# Patient Record
Sex: Male | Born: 1940 | Race: Asian | Hispanic: No | Smoking: Never smoker
Health system: Southern US, Community
[De-identification: ages and names within clinical notes are randomized; demographics above are authoritative.]

## PROBLEM LIST (undated history)

## (undated) DIAGNOSIS — H9190 Unspecified hearing loss, unspecified ear: Secondary | ICD-10-CM

## (undated) DIAGNOSIS — I10 Essential (primary) hypertension: Secondary | ICD-10-CM

## (undated) HISTORY — PX: EYE SURGERY: SHX253

---

## 1999-11-27 ENCOUNTER — Emergency Department (HOSPITAL_COMMUNITY): Admission: EM | Admit: 1999-11-27 | Discharge: 1999-11-27 | Payer: Self-pay | Admitting: Emergency Medicine

## 1999-11-27 ENCOUNTER — Encounter: Payer: Self-pay | Admitting: Emergency Medicine

## 2002-02-22 ENCOUNTER — Inpatient Hospital Stay (HOSPITAL_COMMUNITY): Admission: EM | Admit: 2002-02-22 | Discharge: 2002-02-25 | Payer: Self-pay | Admitting: Emergency Medicine

## 2002-03-14 ENCOUNTER — Encounter: Admission: RE | Admit: 2002-03-14 | Discharge: 2002-03-14 | Payer: Self-pay | Admitting: Internal Medicine

## 2003-01-13 ENCOUNTER — Emergency Department (HOSPITAL_COMMUNITY): Admission: EM | Admit: 2003-01-13 | Discharge: 2003-01-13 | Payer: Self-pay | Admitting: Emergency Medicine

## 2011-08-06 ENCOUNTER — Encounter (HOSPITAL_COMMUNITY): Payer: Self-pay

## 2011-08-06 ENCOUNTER — Emergency Department (HOSPITAL_COMMUNITY): Payer: Medicare Other

## 2011-08-06 ENCOUNTER — Emergency Department (HOSPITAL_COMMUNITY)
Admission: EM | Admit: 2011-08-06 | Discharge: 2011-08-06 | Disposition: A | Discharge status: Home / Self Care | Payer: Medicare Other | Attending: Emergency Medicine | Admitting: Emergency Medicine

## 2011-08-06 DIAGNOSIS — E119 Type 2 diabetes mellitus without complications: Secondary | ICD-10-CM | POA: Insufficient documentation

## 2011-08-06 DIAGNOSIS — N400 Enlarged prostate without lower urinary tract symptoms: Secondary | ICD-10-CM | POA: Insufficient documentation

## 2011-08-06 DIAGNOSIS — R109 Unspecified abdominal pain: Secondary | ICD-10-CM | POA: Insufficient documentation

## 2011-08-06 DIAGNOSIS — C61 Malignant neoplasm of prostate: Secondary | ICD-10-CM | POA: Insufficient documentation

## 2011-08-06 LAB — CBC
HCT: 38.8 % — ABNORMAL LOW (ref 39.0–52.0)
Hemoglobin: 12.4 g/dL — ABNORMAL LOW (ref 13.0–17.0)
MCH: 26.2 pg (ref 26.0–34.0)
MCHC: 32 g/dL (ref 30.0–36.0)
MCV: 81.9 fL (ref 78.0–100.0)
Platelets: 223 10*3/uL (ref 150–400)
RBC: 4.74 MIL/uL (ref 4.22–5.81)
RDW: 12.6 % (ref 11.5–15.5)
WBC: 9.3 10*3/uL (ref 4.0–10.5)

## 2011-08-06 LAB — URINALYSIS, ROUTINE W REFLEX MICROSCOPIC
Bilirubin Urine: NEGATIVE
Glucose, UA: NEGATIVE mg/dL
Ketones, ur: NEGATIVE mg/dL
Leukocytes, UA: NEGATIVE
Nitrite: NEGATIVE
Protein, ur: 100 mg/dL — AB
Specific Gravity, Urine: 1.022 (ref 1.005–1.030)
Urobilinogen, UA: 0.2 mg/dL (ref 0.0–1.0)
pH: 5 (ref 5.0–8.0)

## 2011-08-06 LAB — DIFFERENTIAL
Basophils Absolute: 0 10*3/uL (ref 0.0–0.1)
Basophils Relative: 0 % (ref 0–1)
Eosinophils Absolute: 0.3 10*3/uL (ref 0.0–0.7)
Eosinophils Relative: 3 % (ref 0–5)
Lymphocytes Relative: 23 % (ref 12–46)
Lymphs Abs: 2.1 10*3/uL (ref 0.7–4.0)
Monocytes Absolute: 0.7 10*3/uL (ref 0.1–1.0)
Monocytes Relative: 8 % (ref 3–12)
Neutro Abs: 6.2 10*3/uL (ref 1.7–7.7)
Neutrophils Relative %: 67 % (ref 43–77)

## 2011-08-06 LAB — BASIC METABOLIC PANEL
BUN: 43 mg/dL — ABNORMAL HIGH (ref 6–23)
CO2: 26 mEq/L (ref 19–32)
Calcium: 9.1 mg/dL (ref 8.4–10.5)
Chloride: 100 mEq/L (ref 96–112)
Creatinine, Ser: 2.62 mg/dL — ABNORMAL HIGH (ref 0.50–1.35)
GFR calc Af Amer: 29 mL/min — ABNORMAL LOW (ref >60)
GFR calc non Af Amer: 24 mL/min — ABNORMAL LOW (ref >60)
Glucose, Bld: 126 mg/dL — ABNORMAL HIGH (ref 70–99)
Potassium: 3.7 mEq/L (ref 3.5–5.1)
Sodium: 136 mEq/L (ref 135–145)

## 2011-08-06 LAB — URINE MICROSCOPIC-ADD ON

## 2013-10-24 ENCOUNTER — Other Ambulatory Visit: Payer: Self-pay | Admitting: Internal Medicine

## 2013-10-24 ENCOUNTER — Ambulatory Visit
Admission: RE | Admit: 2013-10-24 | Discharge: 2013-10-24 | Disposition: A | Discharge status: Home / Self Care | Payer: Medicare Other | Source: Ambulatory Visit | Attending: Internal Medicine | Admitting: Internal Medicine

## 2013-10-24 DIAGNOSIS — M25562 Pain in left knee: Secondary | ICD-10-CM

## 2015-11-05 ENCOUNTER — Other Ambulatory Visit: Payer: Self-pay | Admitting: Internal Medicine

## 2015-11-05 DIAGNOSIS — N259 Disorder resulting from impaired renal tubular function, unspecified: Secondary | ICD-10-CM

## 2015-11-06 ENCOUNTER — Other Ambulatory Visit: Payer: Self-pay | Admitting: Internal Medicine

## 2015-11-06 DIAGNOSIS — N289 Disorder of kidney and ureter, unspecified: Secondary | ICD-10-CM

## 2015-11-06 DIAGNOSIS — R978 Other abnormal tumor markers: Secondary | ICD-10-CM

## 2015-11-06 DIAGNOSIS — Z131 Encounter for screening for diabetes mellitus: Secondary | ICD-10-CM

## 2015-11-08 ENCOUNTER — Ambulatory Visit
Admission: RE | Admit: 2015-11-08 | Discharge: 2015-11-08 | Disposition: A | Discharge status: Home / Self Care | Payer: Medicare Other | Source: Ambulatory Visit | Attending: Internal Medicine | Admitting: Internal Medicine

## 2015-11-08 DIAGNOSIS — N289 Disorder of kidney and ureter, unspecified: Secondary | ICD-10-CM

## 2015-11-08 DIAGNOSIS — R978 Other abnormal tumor markers: Secondary | ICD-10-CM

## 2015-11-09 ENCOUNTER — Other Ambulatory Visit: Payer: Medicare Other

## 2017-05-29 ENCOUNTER — Other Ambulatory Visit: Payer: Self-pay | Admitting: Ophthalmology

## 2017-05-29 DIAGNOSIS — H34212 Partial retinal artery occlusion, left eye: Secondary | ICD-10-CM

## 2017-06-02 ENCOUNTER — Ambulatory Visit
Admission: RE | Admit: 2017-06-02 | Discharge: 2017-06-02 | Disposition: A | Discharge status: Home / Self Care | Payer: Medicare Other | Source: Ambulatory Visit | Attending: Ophthalmology | Admitting: Ophthalmology

## 2017-06-02 DIAGNOSIS — H34212 Partial retinal artery occlusion, left eye: Secondary | ICD-10-CM

## 2018-08-16 ENCOUNTER — Other Ambulatory Visit: Payer: Self-pay | Admitting: Nephrology

## 2018-08-16 DIAGNOSIS — N183 Chronic kidney disease, stage 3 unspecified: Secondary | ICD-10-CM

## 2018-08-16 DIAGNOSIS — I129 Hypertensive chronic kidney disease with stage 1 through stage 4 chronic kidney disease, or unspecified chronic kidney disease: Secondary | ICD-10-CM

## 2018-10-15 ENCOUNTER — Ambulatory Visit
Admission: RE | Admit: 2018-10-15 | Discharge: 2018-10-15 | Disposition: A | Discharge status: Home / Self Care | Payer: Medicare Other | Source: Ambulatory Visit | Attending: Nephrology | Admitting: Nephrology

## 2018-10-15 DIAGNOSIS — N183 Chronic kidney disease, stage 3 unspecified: Secondary | ICD-10-CM

## 2018-10-15 DIAGNOSIS — I129 Hypertensive chronic kidney disease with stage 1 through stage 4 chronic kidney disease, or unspecified chronic kidney disease: Secondary | ICD-10-CM

## 2020-02-08 ENCOUNTER — Ambulatory Visit: Payer: Medicare Other | Attending: Internal Medicine

## 2020-02-08 DIAGNOSIS — Z20822 Contact with and (suspected) exposure to covid-19: Secondary | ICD-10-CM

## 2020-02-09 LAB — NOVEL CORONAVIRUS, NAA: SARS-CoV-2, NAA: NOT DETECTED

## 2021-01-28 ENCOUNTER — Emergency Department (HOSPITAL_COMMUNITY): Payer: Medicare Other

## 2021-01-28 ENCOUNTER — Inpatient Hospital Stay (HOSPITAL_COMMUNITY)
Admission: EM | Admit: 2021-01-28 | Discharge: 2021-02-06 | DRG: 270 | Disposition: A | Discharge status: Home Health | Payer: Medicare Other | Attending: Family Medicine | Admitting: Family Medicine

## 2021-01-28 DIAGNOSIS — J9601 Acute respiratory failure with hypoxia: Secondary | ICD-10-CM | POA: Diagnosis present

## 2021-01-28 DIAGNOSIS — I10 Essential (primary) hypertension: Secondary | ICD-10-CM | POA: Diagnosis present

## 2021-01-28 DIAGNOSIS — R911 Solitary pulmonary nodule: Secondary | ICD-10-CM | POA: Diagnosis present

## 2021-01-28 DIAGNOSIS — I255 Ischemic cardiomyopathy: Secondary | ICD-10-CM | POA: Diagnosis present

## 2021-01-28 DIAGNOSIS — I2584 Coronary atherosclerosis due to calcified coronary lesion: Secondary | ICD-10-CM | POA: Diagnosis present

## 2021-01-28 DIAGNOSIS — N184 Chronic kidney disease, stage 4 (severe): Secondary | ICD-10-CM | POA: Diagnosis present

## 2021-01-28 DIAGNOSIS — I214 Non-ST elevation (NSTEMI) myocardial infarction: Principal | ICD-10-CM

## 2021-01-28 DIAGNOSIS — T8383XA Hemorrhage of genitourinary prosthetic devices, implants and grafts, initial encounter: Secondary | ICD-10-CM | POA: Diagnosis not present

## 2021-01-28 DIAGNOSIS — I959 Hypotension, unspecified: Secondary | ICD-10-CM | POA: Diagnosis not present

## 2021-01-28 DIAGNOSIS — R451 Restlessness and agitation: Secondary | ICD-10-CM | POA: Diagnosis not present

## 2021-01-28 DIAGNOSIS — I5041 Acute combined systolic (congestive) and diastolic (congestive) heart failure: Secondary | ICD-10-CM | POA: Diagnosis present

## 2021-01-28 DIAGNOSIS — J811 Chronic pulmonary edema: Secondary | ICD-10-CM

## 2021-01-28 DIAGNOSIS — M79671 Pain in right foot: Secondary | ICD-10-CM

## 2021-01-28 DIAGNOSIS — E785 Hyperlipidemia, unspecified: Secondary | ICD-10-CM | POA: Diagnosis present

## 2021-01-28 DIAGNOSIS — Z7984 Long term (current) use of oral hypoglycemic drugs: Secondary | ICD-10-CM

## 2021-01-28 DIAGNOSIS — Z79899 Other long term (current) drug therapy: Secondary | ICD-10-CM

## 2021-01-28 DIAGNOSIS — I509 Heart failure, unspecified: Secondary | ICD-10-CM

## 2021-01-28 DIAGNOSIS — N179 Acute kidney failure, unspecified: Secondary | ICD-10-CM

## 2021-01-28 DIAGNOSIS — N289 Disorder of kidney and ureter, unspecified: Secondary | ICD-10-CM

## 2021-01-28 DIAGNOSIS — H9190 Unspecified hearing loss, unspecified ear: Secondary | ICD-10-CM | POA: Diagnosis present

## 2021-01-28 DIAGNOSIS — M79604 Pain in right leg: Secondary | ICD-10-CM | POA: Diagnosis not present

## 2021-01-28 DIAGNOSIS — J810 Acute pulmonary edema: Secondary | ICD-10-CM

## 2021-01-28 DIAGNOSIS — D72829 Elevated white blood cell count, unspecified: Secondary | ICD-10-CM | POA: Diagnosis present

## 2021-01-28 DIAGNOSIS — R310 Gross hematuria: Secondary | ICD-10-CM | POA: Diagnosis not present

## 2021-01-28 DIAGNOSIS — Z87891 Personal history of nicotine dependence: Secondary | ICD-10-CM

## 2021-01-28 DIAGNOSIS — I130 Hypertensive heart and chronic kidney disease with heart failure and stage 1 through stage 4 chronic kidney disease, or unspecified chronic kidney disease: Secondary | ICD-10-CM | POA: Diagnosis present

## 2021-01-28 DIAGNOSIS — I700 Atherosclerosis of aorta: Secondary | ICD-10-CM | POA: Diagnosis present

## 2021-01-28 DIAGNOSIS — R338 Other retention of urine: Secondary | ICD-10-CM | POA: Diagnosis present

## 2021-01-28 DIAGNOSIS — R570 Cardiogenic shock: Secondary | ICD-10-CM | POA: Diagnosis not present

## 2021-01-28 DIAGNOSIS — I2510 Atherosclerotic heart disease of native coronary artery without angina pectoris: Secondary | ICD-10-CM | POA: Diagnosis present

## 2021-01-28 DIAGNOSIS — I2729 Other secondary pulmonary hypertension: Secondary | ICD-10-CM | POA: Diagnosis present

## 2021-01-28 DIAGNOSIS — F05 Delirium due to known physiological condition: Secondary | ICD-10-CM | POA: Diagnosis not present

## 2021-01-28 DIAGNOSIS — I169 Hypertensive crisis, unspecified: Secondary | ICD-10-CM

## 2021-01-28 DIAGNOSIS — E1122 Type 2 diabetes mellitus with diabetic chronic kidney disease: Secondary | ICD-10-CM | POA: Diagnosis present

## 2021-01-28 DIAGNOSIS — Z4509 Encounter for adjustment and management of other cardiac device: Secondary | ICD-10-CM

## 2021-01-28 DIAGNOSIS — M109 Gout, unspecified: Secondary | ICD-10-CM | POA: Diagnosis present

## 2021-01-28 DIAGNOSIS — Z20822 Contact with and (suspected) exposure to covid-19: Secondary | ICD-10-CM | POA: Diagnosis present

## 2021-01-28 DIAGNOSIS — R001 Bradycardia, unspecified: Secondary | ICD-10-CM | POA: Diagnosis not present

## 2021-01-28 DIAGNOSIS — I445 Left posterior fascicular block: Secondary | ICD-10-CM | POA: Diagnosis present

## 2021-01-28 DIAGNOSIS — R0603 Acute respiratory distress: Secondary | ICD-10-CM

## 2021-01-28 DIAGNOSIS — R509 Fever, unspecified: Secondary | ICD-10-CM | POA: Diagnosis not present

## 2021-01-28 HISTORY — DX: Essential (primary) hypertension: I10

## 2021-01-28 HISTORY — DX: Unspecified hearing loss, unspecified ear: H91.90

## 2021-01-28 LAB — CBC WITH DIFFERENTIAL/PLATELET
Abs Immature Granulocytes: 0.09 10*3/uL — ABNORMAL HIGH (ref 0.00–0.07)
Basophils Absolute: 0.1 10*3/uL (ref 0.0–0.1)
Basophils Relative: 0 %
Eosinophils Absolute: 0.3 10*3/uL (ref 0.0–0.5)
Eosinophils Relative: 2 %
HCT: 37.9 % — ABNORMAL LOW (ref 39.0–52.0)
Hemoglobin: 12.1 g/dL — ABNORMAL LOW (ref 13.0–17.0)
Immature Granulocytes: 1 %
Lymphocytes Relative: 13 %
Lymphs Abs: 2.1 10*3/uL (ref 0.7–4.0)
MCH: 26.9 pg (ref 26.0–34.0)
MCHC: 31.9 g/dL (ref 30.0–36.0)
MCV: 84.2 fL (ref 80.0–100.0)
Monocytes Absolute: 0.9 10*3/uL (ref 0.1–1.0)
Monocytes Relative: 6 %
Neutro Abs: 12.3 10*3/uL — ABNORMAL HIGH (ref 1.7–7.7)
Neutrophils Relative %: 78 %
Platelets: 501 10*3/uL — ABNORMAL HIGH (ref 150–400)
RBC: 4.5 MIL/uL (ref 4.22–5.81)
RDW: 13.2 % (ref 11.5–15.5)
WBC: 15.7 10*3/uL — ABNORMAL HIGH (ref 4.0–10.5)
nRBC: 0 % (ref 0.0–0.2)

## 2021-01-28 MED ORDER — ASPIRIN 81 MG PO CHEW
324.0000 mg | CHEWABLE_TABLET | Freq: Once | ORAL | Status: AC
  Administered 2021-01-28: 324 mg via ORAL
  Filled 2021-01-28: qty 4

## 2021-01-28 MED ORDER — NITROGLYCERIN 0.4 MG SL SUBL
0.4000 mg | SUBLINGUAL_TABLET | SUBLINGUAL | Status: DC | PRN
  Administered 2021-01-28: 0.4 mg via SUBLINGUAL
  Filled 2021-01-28: qty 1

## 2021-01-28 NOTE — ED Provider Notes (Signed)
[facility] EMERGENCY DEPARTMENT Provider Note   CSN: [id number] Arrival date & time: 01/28/21  2221     History No chief complaint on file.   Randy [name] is a 80 y.o. male.  Patient with no reported PMH presents to the ED with a chief complaint of sudden onset SOB and CP that started tonight after eating noodles.  He denies having ever had symptoms like this before.  He denies any fevers, chills, or cough.  Denies nausea or vomiting.  He has received 2 nebs PTA along with a Tylenol and 125mg  of solumedrol.  He reports some improvement with these medications.  He denies known covid exposures.  He has been vaccinated.  The history is provided by the patient. The history is limited by a language barrier. A language interpreter was used ([place] interpreter used).       No past medical history on file.  There are no problems to display for this patient.   Past Surgical History:  Procedure Laterality Date  . EYE SURGERY         No family history on file.     Home Medications Prior to Admission medications   Not on File    Allergies    Patient has no allergy information on record.  Review of Systems   Review of Systems  All other systems reviewed and are negative.   Physical Exam Updated Vital Signs BP (!) 176/85   Pulse 88   Temp 98.1 F (36.7 C) (Oral)   Resp (!) 28   SpO2 100%   Physical Exam Vitals and nursing note reviewed.  Constitutional:      Appearance: He is well-developed and well-nourished.  HENT:     Head: Normocephalic and atraumatic.  Eyes:     Conjunctiva/sclera: Conjunctivae normal.  Cardiovascular:     Rate and Rhythm: Normal rate and regular rhythm.     Heart sounds: No murmur heard.   Pulmonary:     Comments: Mild lower lobe crackles Increased work of breathing Abdominal:     Palpations: Abdomen is soft.     Tenderness: There is no abdominal tenderness.  Musculoskeletal:        General: No edema.      Cervical back: Neck supple.  Skin:    General: Skin is warm and dry.  Neurological:     Mental Status: He is alert and oriented to person, place, and time.  Psychiatric:        Mood and Affect: Mood and affect and mood normal.        Behavior: Behavior normal.     ED Results / Procedures / Treatments   Labs (all labs ordered are listed, but only abnormal results are displayed) Labs Reviewed  CBC WITH DIFFERENTIAL/PLATELET  BASIC METABOLIC PANEL  BRAIN NATRIURETIC PEPTIDE  POC SARS CORONAVIRUS 2 AG -  ED  TROPONIN I (HIGH SENSITIVITY)    EKG None  Radiology DG Chest Port 1 View  Result Date: 01/28/2021 CLINICAL DATA:  Shortness of breath EXAM: PORTABLE CHEST 1 VIEW COMPARISON:  None. FINDINGS: Heart is borderline in size. Vascular congestion. Mild interstitial prominence could reflect interstitial edema. Right basilar opacity, likely atelectasis with small right effusion. IMPRESSION: Borderline heart size with vascular congestion and possible early interstitial edema. Small right effusion with right base atelectasis. Electronically Signed   By: Rolm Baptise M.D.   On: 01/28/2021 22:55    Procedures .Critical Care Performed by: Montine Circle, PA-C Authorized by: Marlon Pel,  Herbie Baltimore, PA-C   Critical care provider statement:    Critical care time (minutes):  72   Critical care was necessary to treat or prevent imminent or life-threatening deterioration of the following conditions:  Respiratory failure   Critical care was time spent personally by me on the following activities:  Discussions with consultants, evaluation of patient's response to treatment, examination of patient, ordering and performing treatments and interventions, ordering and review of laboratory studies, ordering and review of radiographic studies, pulse oximetry, re-evaluation of patient's condition, obtaining history from patient or surrogate and review of old charts     Medications Ordered in  ED Medications  aspirin chewable tablet 324 mg (has no administration in time range)  nitroGLYCERIN (NITROSTAT) SL tablet 0.4 mg (has no administration in time range)    ED Course  I have reviewed the triage vital signs and the nursing notes.  Pertinent labs & imaging results that were available during my care of the patient were reviewed by me and considered in my medical decision making (see chart for details).    MDM Rules/Calculators/A&P                          This patient complains of SOB, CP, and wheezing, this involves an extensive number of treatment options, and is a complaint that carries with it a high risk of complications and morbidity.    Randy [name] was evaluated in Emergency Department on 01/28/2021 for the symptoms described in the history of present illness. He was evaluated in the context of the global COVID-19 pandemic, which necessitated consideration that the patient might be at risk for infection with the SARS-CoV-2 virus that causes COVID-19. Institutional protocols and algorithms that pertain to the evaluation of patients at risk for COVID-19 are in a state of rapid change based on information released by regulatory bodies including the CDC and federal and state organizations. These policies and algorithms were followed during the patient's care in the ED.  10:58 PM I attempted to contact the patient's son with the number he provided, but was unsuccessful.  Differential Dx ACS, PE, COVID, pulmonary edema, allergic reaction  Pertinent Labs I ordered, reviewed, and interpreted labs, which included trop 238, Cr 1.75 better than 10 years ago, BNP 634, moderate leukocytosis to 15.7.  Imaging Interpretation I ordered imaging studies which included CXR.  I independently visualized and interpreted the CXR, which showed pulmonary edema.   Medications I ordered medication SL nitro, nitro infusion, lasix for pulmonary edema.  Sources Previous records obtained  and reviewed no recent visits in years, care everywhere shows an eye surgery at [facility] a couple years ago.   Critical Interventions  BiPAP Nitro infusion Continuous monitoring  Reassessments After the interventions stated above, I reevaluated the patient and found with worsening respiratory distress.  Placed patient on BiPAP and started nitro infusion.  2:09 AM Breathing significantly improved with BiPAP.  Consultants Dr. Paticia Stack - Cardiology, who is appreciated for consulting. Dr. Alcario Drought - TRH, who is appreciated for admitting.  Plan Admit     Final Clinical Impression(s) / ED Diagnoses Final diagnoses:  Acute pulmonary edema ([place])  Hypertensive crisis  Respiratory distress    Rx / DC Orders ED Discharge Orders    None       Montine Circle, PA-C 01/29/21 0210    Quintella Reichert, MD 01/31/21 [phone]

## 2021-01-28 NOTE — ED Triage Notes (Signed)
Pt bib EMS from [facility] for sudden onset SOB. Pt speaks laotian. [facility].   Pt had sudden onset of SOB without any recent fever or cough. No significant medical history noted.  Per EMS pts lungs sounds are very diminished with expiratory wheezing.  Pt has received 2 nebulizer treatments. 5 of albuterol x1 and 5 of albuterol with .5 of atrovant added x1.    20G on left hand, 125 of solumedrol given  Vitals: BP: 170/96 HR: 80 O2: 86% on RA to 100% on 8L Nebulizer

## 2021-01-29 ENCOUNTER — Other Ambulatory Visit (HOSPITAL_COMMUNITY): Payer: Self-pay

## 2021-01-29 ENCOUNTER — Encounter (HOSPITAL_COMMUNITY): Payer: Self-pay | Admitting: Internal Medicine

## 2021-01-29 ENCOUNTER — Inpatient Hospital Stay (HOSPITAL_COMMUNITY): Payer: Medicare Other

## 2021-01-29 DIAGNOSIS — I2729 Other secondary pulmonary hypertension: Secondary | ICD-10-CM | POA: Diagnosis present

## 2021-01-29 DIAGNOSIS — I130 Hypertensive heart and chronic kidney disease with heart failure and stage 1 through stage 4 chronic kidney disease, or unspecified chronic kidney disease: Secondary | ICD-10-CM | POA: Diagnosis present

## 2021-01-29 DIAGNOSIS — F05 Delirium due to known physiological condition: Secondary | ICD-10-CM | POA: Diagnosis not present

## 2021-01-29 DIAGNOSIS — I10 Essential (primary) hypertension: Secondary | ICD-10-CM | POA: Diagnosis not present

## 2021-01-29 DIAGNOSIS — I5041 Acute combined systolic (congestive) and diastolic (congestive) heart failure: Secondary | ICD-10-CM | POA: Diagnosis present

## 2021-01-29 DIAGNOSIS — J9601 Acute respiratory failure with hypoxia: Secondary | ICD-10-CM | POA: Diagnosis present

## 2021-01-29 DIAGNOSIS — I25110 Atherosclerotic heart disease of native coronary artery with unstable angina pectoris: Secondary | ICD-10-CM | POA: Diagnosis not present

## 2021-01-29 DIAGNOSIS — Z20822 Contact with and (suspected) exposure to covid-19: Secondary | ICD-10-CM | POA: Diagnosis present

## 2021-01-29 DIAGNOSIS — T8383XA Hemorrhage of genitourinary prosthetic devices, implants and grafts, initial encounter: Secondary | ICD-10-CM | POA: Diagnosis not present

## 2021-01-29 DIAGNOSIS — R310 Gross hematuria: Secondary | ICD-10-CM | POA: Diagnosis not present

## 2021-01-29 DIAGNOSIS — R001 Bradycardia, unspecified: Secondary | ICD-10-CM | POA: Diagnosis not present

## 2021-01-29 DIAGNOSIS — R7989 Other specified abnormal findings of blood chemistry: Secondary | ICD-10-CM | POA: Diagnosis not present

## 2021-01-29 DIAGNOSIS — R911 Solitary pulmonary nodule: Secondary | ICD-10-CM | POA: Diagnosis present

## 2021-01-29 DIAGNOSIS — R0602 Shortness of breath: Secondary | ICD-10-CM | POA: Diagnosis not present

## 2021-01-29 DIAGNOSIS — N189 Chronic kidney disease, unspecified: Secondary | ICD-10-CM | POA: Diagnosis not present

## 2021-01-29 DIAGNOSIS — E785 Hyperlipidemia, unspecified: Secondary | ICD-10-CM | POA: Diagnosis present

## 2021-01-29 DIAGNOSIS — M79604 Pain in right leg: Secondary | ICD-10-CM | POA: Diagnosis not present

## 2021-01-29 DIAGNOSIS — R0603 Acute respiratory distress: Secondary | ICD-10-CM

## 2021-01-29 DIAGNOSIS — I255 Ischemic cardiomyopathy: Secondary | ICD-10-CM | POA: Diagnosis not present

## 2021-01-29 DIAGNOSIS — M109 Gout, unspecified: Secondary | ICD-10-CM | POA: Diagnosis present

## 2021-01-29 DIAGNOSIS — I169 Hypertensive crisis, unspecified: Secondary | ICD-10-CM | POA: Diagnosis present

## 2021-01-29 DIAGNOSIS — R570 Cardiogenic shock: Secondary | ICD-10-CM | POA: Diagnosis not present

## 2021-01-29 DIAGNOSIS — N179 Acute kidney failure, unspecified: Secondary | ICD-10-CM | POA: Diagnosis present

## 2021-01-29 DIAGNOSIS — R338 Other retention of urine: Secondary | ICD-10-CM | POA: Diagnosis present

## 2021-01-29 DIAGNOSIS — I2510 Atherosclerotic heart disease of native coronary artery without angina pectoris: Secondary | ICD-10-CM | POA: Diagnosis present

## 2021-01-29 DIAGNOSIS — D72829 Elevated white blood cell count, unspecified: Secondary | ICD-10-CM | POA: Diagnosis present

## 2021-01-29 DIAGNOSIS — E1122 Type 2 diabetes mellitus with diabetic chronic kidney disease: Secondary | ICD-10-CM | POA: Diagnosis present

## 2021-01-29 DIAGNOSIS — N184 Chronic kidney disease, stage 4 (severe): Secondary | ICD-10-CM | POA: Diagnosis present

## 2021-01-29 DIAGNOSIS — J810 Acute pulmonary edema: Secondary | ICD-10-CM | POA: Diagnosis not present

## 2021-01-29 DIAGNOSIS — I214 Non-ST elevation (NSTEMI) myocardial infarction: Secondary | ICD-10-CM | POA: Diagnosis present

## 2021-01-29 DIAGNOSIS — I2584 Coronary atherosclerosis due to calcified coronary lesion: Secondary | ICD-10-CM | POA: Diagnosis present

## 2021-01-29 DIAGNOSIS — I509 Heart failure, unspecified: Secondary | ICD-10-CM

## 2021-01-29 DIAGNOSIS — H9190 Unspecified hearing loss, unspecified ear: Secondary | ICD-10-CM | POA: Diagnosis present

## 2021-01-29 DIAGNOSIS — I5021 Acute systolic (congestive) heart failure: Secondary | ICD-10-CM | POA: Diagnosis not present

## 2021-01-29 DIAGNOSIS — I501 Left ventricular failure: Secondary | ICD-10-CM | POA: Diagnosis not present

## 2021-01-29 DIAGNOSIS — R509 Fever, unspecified: Secondary | ICD-10-CM | POA: Diagnosis not present

## 2021-01-29 DIAGNOSIS — N289 Disorder of kidney and ureter, unspecified: Secondary | ICD-10-CM

## 2021-01-29 LAB — BASIC METABOLIC PANEL
Anion gap: 14 (ref 5–15)
Anion gap: 21 — ABNORMAL HIGH (ref 5–15)
BUN: 30 mg/dL — ABNORMAL HIGH (ref 8–23)
BUN: 33 mg/dL — ABNORMAL HIGH (ref 8–23)
CO2: 20 mmol/L — ABNORMAL LOW (ref 22–32)
CO2: 14 mmol/L — ABNORMAL LOW (ref 22–32)
Calcium: 8.3 mg/dL — ABNORMAL LOW (ref 8.9–10.3)
Calcium: 8.6 mg/dL — ABNORMAL LOW (ref 8.9–10.3)
Chloride: 103 mmol/L (ref 98–111)
Chloride: 100 mmol/L (ref 98–111)
Creatinine, Ser: 1.75 mg/dL — ABNORMAL HIGH (ref 0.61–1.24)
Creatinine, Ser: 2.27 mg/dL — ABNORMAL HIGH (ref 0.61–1.24)
GFR, Estimated: 39 mL/min — ABNORMAL LOW (ref >60)
GFR, Estimated: 28 mL/min — ABNORMAL LOW (ref >60)
Glucose, Bld: 204 mg/dL — ABNORMAL HIGH (ref 70–99)
Glucose, Bld: 314 mg/dL — ABNORMAL HIGH (ref 70–99)
Potassium: 3.7 mmol/L (ref 3.5–5.1)
Potassium: 4.9 mmol/L (ref 3.5–5.1)
Sodium: 137 mmol/L (ref 135–145)
Sodium: 135 mmol/L (ref 135–145)

## 2021-01-29 LAB — CBG MONITORING, ED
Glucose-Capillary: 227 mg/dL — ABNORMAL HIGH (ref 70–99)
Glucose-Capillary: 192 mg/dL — ABNORMAL HIGH (ref 70–99)
Glucose-Capillary: 137 mg/dL — ABNORMAL HIGH (ref 70–99)

## 2021-01-29 LAB — URINALYSIS, ROUTINE W REFLEX MICROSCOPIC
Bilirubin Urine: NEGATIVE
Glucose, UA: 150 mg/dL — AB
Ketones, ur: NEGATIVE mg/dL
Leukocytes,Ua: NEGATIVE
Nitrite: NEGATIVE
Protein, ur: NEGATIVE mg/dL
Specific Gravity, Urine: 1.008 (ref 1.005–1.030)
pH: 5 (ref 5.0–8.0)

## 2021-01-29 LAB — HEPARIN LEVEL (UNFRACTIONATED): Heparin Unfractionated: 0.43 IU/mL (ref 0.30–0.70)

## 2021-01-29 LAB — ECHOCARDIOGRAM COMPLETE
Calc EF: 44.9 %
P 1/2 time: 419 msec
S' Lateral: 3.7 cm
Single Plane A2C EF: 42.8 %
Single Plane A4C EF: 43.8 %

## 2021-01-29 LAB — TROPONIN I (HIGH SENSITIVITY)
Troponin I (High Sensitivity): 14097 ng/L (ref <18)
Troponin I (High Sensitivity): 14215 ng/L (ref <18)
Troponin I (High Sensitivity): 15900 ng/L (ref <18)
Troponin I (High Sensitivity): 16150 ng/L (ref <18)
Troponin I (High Sensitivity): 16532 ng/L (ref <18)
Troponin I (High Sensitivity): 238 ng/L (ref <18)
Troponin I (High Sensitivity): 266 ng/L (ref <18)

## 2021-01-29 LAB — LIPID PANEL
Cholesterol: 211 mg/dL — ABNORMAL HIGH (ref 0–200)
HDL: 68 mg/dL (ref >40)
LDL Cholesterol: 129 mg/dL — ABNORMAL HIGH (ref 0–99)
Total CHOL/HDL Ratio: 3.1 RATIO
Triglycerides: 71 mg/dL (ref <150)
VLDL: 14 mg/dL (ref 0–40)

## 2021-01-29 LAB — BRAIN NATRIURETIC PEPTIDE: B Natriuretic Peptide: 643.2 pg/mL — ABNORMAL HIGH (ref 0.0–100.0)

## 2021-01-29 LAB — TSH: TSH: 1.851 u[IU]/mL (ref 0.350–4.500)

## 2021-01-29 LAB — HEMOGLOBIN A1C
Hgb A1c MFr Bld: 8.1 % — ABNORMAL HIGH (ref 4.8–5.6)
Mean Plasma Glucose: 185.77 mg/dL

## 2021-01-29 LAB — SODIUM, URINE, RANDOM: Sodium, Ur: 115 mmol/L

## 2021-01-29 LAB — POC SARS CORONAVIRUS 2 AG -  ED: SARS Coronavirus 2 Ag: NEGATIVE

## 2021-01-29 LAB — CREATININE, URINE, RANDOM: Creatinine, Urine: 35.54 mg/dL

## 2021-01-29 MED ORDER — HEPARIN (PORCINE) 25000 UT/250ML-% IV SOLN
800.0000 [IU]/h | INTRAVENOUS | Status: DC
  Administered 2021-01-29 – 2021-01-30 (×2): 800 [IU]/h via INTRAVENOUS
  Filled 2021-01-29 (×2): qty 250

## 2021-01-29 MED ORDER — SODIUM CHLORIDE 0.9% FLUSH
3.0000 mL | INTRAVENOUS | Status: DC | PRN
Start: 2021-01-29 — End: 2021-02-02

## 2021-01-29 MED ORDER — FUROSEMIDE 10 MG/ML IJ SOLN
40.0000 mg | INTRAMUSCULAR | Status: AC
  Administered 2021-01-29: 40 mg via INTRAVENOUS
  Filled 2021-01-29: qty 4

## 2021-01-29 MED ORDER — SODIUM CHLORIDE 0.9 % IV SOLN
250.0000 mL | INTRAVENOUS | Status: DC | PRN
Start: 2021-01-29 — End: 2021-01-30

## 2021-01-29 MED ORDER — SODIUM CHLORIDE 0.9 % IR SOLN
3000.0000 mL | Status: DC
  Administered 2021-01-29 – 2021-01-30 (×3): 3000 mL

## 2021-01-29 MED ORDER — SODIUM CHLORIDE 0.9% FLUSH
3.0000 mL | Freq: Two times a day (BID) | INTRAVENOUS | Status: DC
  Administered 2021-01-30 – 2021-01-31 (×3): 3 mL via INTRAVENOUS

## 2021-01-29 MED ORDER — FUROSEMIDE 10 MG/ML IJ SOLN
40.0000 mg | Freq: Every day | INTRAMUSCULAR | Status: DC
  Administered 2021-01-29: 40 mg via INTRAVENOUS
  Filled 2021-01-29: qty 4

## 2021-01-29 MED ORDER — ONDANSETRON HCL 4 MG/2ML IJ SOLN: 4.0000 mg | Freq: Four times a day (QID) | INTRAMUSCULAR | Status: DC | PRN

## 2021-01-29 MED ORDER — ATORVASTATIN CALCIUM 80 MG PO TABS
80.0000 mg | ORAL_TABLET | Freq: Every day | ORAL | Status: DC
  Administered 2021-01-29 – 2021-01-30 (×2): 80 mg via ORAL
  Filled 2021-01-29 (×2): qty 1

## 2021-01-29 MED ORDER — ISOSORB DINITRATE-HYDRALAZINE 20-37.5 MG PO TABS
1.0000 | ORAL_TABLET | Freq: Three times a day (TID) | ORAL | Status: DC
  Administered 2021-01-29 – 2021-01-30 (×5): 1 via ORAL
  Filled 2021-01-29 (×5): qty 1

## 2021-01-29 MED ORDER — ACETAMINOPHEN 325 MG PO TABS
650.0000 mg | ORAL_TABLET | ORAL | Status: DC | PRN
  Administered 2021-01-29: 650 mg via ORAL
  Filled 2021-01-29: qty 2

## 2021-01-29 MED ORDER — SODIUM CHLORIDE 0.9 % IV SOLN
INTRAVENOUS | Status: DC
  Administered 2021-01-30: 20 mL/h via INTRAVENOUS

## 2021-01-29 MED ORDER — ASPIRIN EC 81 MG PO TBEC
81.0000 mg | DELAYED_RELEASE_TABLET | Freq: Every day | ORAL | Status: DC
  Administered 2021-01-29: 81 mg via ORAL
  Filled 2021-01-29: qty 1

## 2021-01-29 MED ORDER — SODIUM CHLORIDE 0.9% FLUSH
3.0000 mL | Freq: Two times a day (BID) | INTRAVENOUS | Status: DC
  Administered 2021-01-29 – 2021-01-31 (×4): 3 mL via INTRAVENOUS

## 2021-01-29 MED ORDER — INSULIN ASPART 100 UNIT/ML ~~LOC~~ SOLN
0.0000 [IU] | SUBCUTANEOUS | Status: DC
  Administered 2021-01-29: 3 [IU] via SUBCUTANEOUS
  Administered 2021-01-29: 2 [IU] via SUBCUTANEOUS
  Administered 2021-01-30 – 2021-02-01 (×5): 1 [IU] via SUBCUTANEOUS
  Administered 2021-02-01 (×2): 2 [IU] via SUBCUTANEOUS
  Administered 2021-02-01 (×2): 1 [IU] via SUBCUTANEOUS
  Administered 2021-02-01: 5 [IU] via SUBCUTANEOUS
  Administered 2021-02-02: 3 [IU] via SUBCUTANEOUS
  Administered 2021-02-02: 2 [IU] via SUBCUTANEOUS

## 2021-01-29 MED ORDER — ENOXAPARIN SODIUM 40 MG/0.4ML ~~LOC~~ SOLN: 40.0000 mg | SUBCUTANEOUS | Status: DC

## 2021-01-29 MED ORDER — HEPARIN BOLUS VIA INFUSION
4000.0000 [IU] | Freq: Once | INTRAVENOUS | Status: AC
  Administered 2021-01-29: 4000 [IU] via INTRAVENOUS
  Filled 2021-01-29: qty 4000

## 2021-01-29 MED ORDER — NITROGLYCERIN IN D5W 200-5 MCG/ML-% IV SOLN
0.0000 ug/min | INTRAVENOUS | Status: DC
  Administered 2021-01-29: 5 ug/min via INTRAVENOUS
  Administered 2021-01-29: 90 ug/min via INTRAVENOUS
  Administered 2021-01-30: 30 ug/min via INTRAVENOUS
  Filled 2021-01-29 (×3): qty 250

## 2021-01-29 NOTE — ED Notes (Signed)
Pt placed on Cardiac pads; zoll.

## 2021-01-29 NOTE — Progress Notes (Signed)
  Echocardiogram 2D Echocardiogram has been performed.  Randy [name] M 01/29/2021, 10:51 AM

## 2021-01-29 NOTE — ED Notes (Signed)
Attempted to place coude catheter x 2 without success. Resistance met and when catheter removed large blood clot noted in catheter.  Patient had blood noted to penis and noted on sheets prior to 1st coude attempt

## 2021-01-29 NOTE — ED Notes (Signed)
Cardiology at bedside.

## 2021-01-29 NOTE — ED Notes (Signed)
Date and time results received: 01/29/21 ** Test: troponin Critical Value: 16,150  Name of Provider Notified: Heath Lark MD

## 2021-01-29 NOTE — Consult Note (Signed)
[facility] Consult Note   Primary Physician:  Jilda Panda, MD Primary Cardiologist: None listed  Reason for Consultation: Shortness of breath  HPI:    Randy [name] is an 80 year old male with a history of hypertension, who has not seen a physician in ages, presents to the hospital with complaints of chest pain and shortness of breath.  The patient had sudden onset of dyspnea in the evening. He also reported some sharp chest pains. EMS documented that the patient had diminished air entry on auscultation and had wheezes. He was treated with Albuterol, Atrovent and Solumedrol.  In the ED the patient's blood pressure was as high as 200/109 mmHg. He was given 40 mg of IV Lasix and started on NTG infusion. The patient had marked improvement on these therapies.  High sensitivity troponins were flat at 238 amd 266. BNP was 643.2. HGBA1c 8.1  The ECG from 01/29/2021 (at 01:09 am) showed sinus rhythm, rate 95 bpm, LVH, ST depressions in inferolateral leads.   Home Medications Prior to Admission medications   Medication Sig Start Date End Date Taking? Authorizing Provider  amLODipine (NORVASC) 5 MG tablet Take by mouth.    [provider]    Past Medical History: Past Medical History:  Diagnosis Date  . Hearing loss   . HTN (hypertension)     Past Surgical History: Past Surgical History:  Procedure Laterality Date  . EYE SURGERY      Family History: History reviewed. No pertinent family history.  Social History: Social History   Socioeconomic History  . Marital status: Married    Spouse name: Not on file  . Number of children: Not on file  . Years of education: Not on file  . Highest education level: Not on file  Occupational History  . Not on file  Tobacco Use  . Smoking status: Not on file  . Smokeless tobacco: Not on file  Substance and Sexual Activity  . Alcohol use: Not on file  . Drug use: Not on file  . Sexual activity: Not on file  Other  Topics Concern  . Not on file  Social History Narrative  . Not on file   Social Determinants of Health   Financial Resource Strain: Not on file  Food Insecurity: Not on file  Transportation Needs: Not on file  Physical Activity: Not on file  Stress: Not on file  Social Connections: Not on file    Allergies:  Not on File   Review of Systems: [y] = yes, [ ]  = no   . General: Weight gain [ ] ; Weight loss [ ] ; Anorexia [ ] ; Fatigue [ ] ; Fever [ ] ; Chills [ ] ; Weakness [ ]   . Cardiac: Chest pain/pressure [Y]; Resting SOB [Y]; Exertional SOB [ ] ; Orthopnea [ ] ; Pedal Edema [ ] ; Palpitations [ ] ; Syncope [ ] ; Presyncope [ ] ; Paroxysmal nocturnal dyspnea[ ]   . Pulmonary: Cough [ ] ; Wheezing[ ] ; Hemoptysis[ ] ; Sputum [ ] ; Snoring [ ]   . GI: Vomiting[ ] ; Dysphagia[ ] ; Melena[ ] ; Hematochezia [ ] ; Heartburn[ ] ; Abdominal pain [ ] ; Constipation [ ] ; Diarrhea [ ] ; BRBPR [ ]   . GU: Hematuria[ ] ; Dysuria [ ] ; Nocturia[ ]   . Vascular: Pain in legs with walking [ ] ; Pain in feet with lying flat [ ] ; Non-healing sores [ ] ; Stroke [ ] ; TIA [ ] ; Slurred speech [ ] ;  . Neuro: Headaches[ ] ; Vertigo[ ] ; Seizures[ ] ; Paresthesias[ ] ;Blurred vision [ ] ; Diplopia [ ] ;  Vision changes [ ]   . Ortho/Skin: Arthritis [ ] ; Joint pain [ ] ; Muscle pain [ ] ; Joint swelling [ ] ; Back Pain [ ] ; Rash [ ]   . Psych: Depression[ ] ; Anxiety[ ]   . Heme: Bleeding problems [ ] ; Clotting disorders [ ] ; Anemia [ ]   . Endocrine: Diabetes [ ] ; Thyroid dysfunction[ ]      Objective:    Vital Signs:   Temp:  [98.1 F (36.7 C)] 98.1 F (36.7 C) (02/07 2226) Pulse Rate:  [82-109] 82 (02/08 0345) Resp:  [19-35] 21 (02/08 0345) BP: (125-200)/(68-127) 125/71 (02/08 0345) SpO2:  [93 %-100 %] 98 % (02/08 0345) FiO2 (%):  [40 %] 40 % (02/08 0236)    Weight change: There were no vitals filed for this visit.  Intake/Output:  No intake or output data in the 24 hours ending 01/29/21 0457    Physical Exam    General:  Well  appearing. No resp difficulty HEENT: normal Neck: supple. JVP . Carotids 2+ bilat; no bruits. No lymphadenopathy or thyromegaly appreciated. Cor: PMI nondisplaced. Regular rate & rhythm. No rubs, gallops or murmurs. Lungs: occasional crackles Abdomen: soft, nontender, nondistended. No hepatosplenomegaly. No bruits or masses. Good bowel sounds. Extremities: no cyanosis, clubbing, rash, edema Neuro: alert & orientedx3, cranial nerves grossly intact. moves all 4 extremities w/o difficulty. Affect pleasant    Labs   Basic Metabolic Panel: Recent Labs  Lab 01/28/21 2322  NA 137  K 3.7  CL 103  CO2 20*  GLUCOSE 204*  BUN 30*  CREATININE 1.75*  CALCIUM 8.3*    Liver Function Tests: No results for input(s): AST, ALT, ALKPHOS, BILITOT, PROT, ALBUMIN in the last 168 hours. No results for input(s): LIPASE, AMYLASE in the last 168 hours. No results for input(s): AMMONIA in the last 168 hours.  CBC: Recent Labs  Lab 01/28/21 2322  WBC 15.7*  NEUTROABS 12.3*  HGB 12.1*  HCT 37.9*  MCV 84.2  PLT 501*    Cardiac Enzymes: No results for input(s): CKTOTAL, CKMB, CKMBINDEX, TROPONINI in the last 168 hours.  BNP: BNP (last 3 results) Recent Labs    01/28/21 2322  BNP 643.2*    ProBNP (last 3 results) No results for input(s): PROBNP in the last 8760 hours.   CBG: No results for input(s): GLUCAP in the last 168 hours.  Coagulation Studies: No results for input(s): LABPROT, INR in the last 72 hours.   Imaging   DG Chest Port 1 View  Result Date: 01/28/2021 CLINICAL DATA:  Shortness of breath EXAM: PORTABLE CHEST 1 VIEW COMPARISON:  None. FINDINGS: Heart is borderline in size. Vascular congestion. Mild interstitial prominence could reflect interstitial edema. Right basilar opacity, likely atelectasis with small right effusion. IMPRESSION: Borderline heart size with vascular congestion and possible early interstitial edema. Small right effusion with right base atelectasis.  Electronically Signed   By: Rolm Baptise M.D.   On: 01/28/2021 22:55      Medications:     Current Medications: . enoxaparin (LOVENOX) injection  40 mg Subcutaneous Q24H  . furosemide  40 mg Intravenous Daily  . sodium chloride flush  3 mL Intravenous Q12H     Infusions: . sodium chloride    . nitroGLYCERIN 55 mcg/min (01/29/21 0424)       Assessment/Plan   1. Shortness of breath Likely to be secondary to elevated blood pressure and subsequent acute pulmonary edema.    - Agree with NTG infusion - Optimize BP control - Diuresis with IV Lasix -  Strict I and Os - Daily weights   2. Elevated troponin The trend initially is flat (238 and 266). Does not seem consistent with an ACS. ECG shows LVH with repol abnormalities  - Continue to trend cardiac enzymes - Serial ECGs - ASA 81 mg daily - IV NTG for chest pain - Consider IV heparin only if rising troponin - Transthoracic echocardiogram     Meade Maw, MD  01/29/2021, 4:57 AM  Cardiology Overnight Team Please contact [facility] for night-coverage after hours (4p -7a ) and weekends on [email or web address]

## 2021-01-29 NOTE — Progress Notes (Addendum)
Inpatient Diabetes Program Recommendations  AACE/ADA: New Consensus Statement on Inpatient Glycemic Control (2015)  Target Ranges:  Prepandial:   less than 140 mg/dL      Peak postprandial:   less than 180 mg/dL (1-2 hours)      Critically ill patients:  140 - 180 mg/dL   Lab Results  Component Value Date   GLUCAP 227 (H) 01/29/2021   HGBA1C 8.1 (H) 01/29/2021    Review of Glycemic Control  Diabetes history: DM 2 Outpatient Diabetes medications: Metformin 500 mg bid  A1c 8.1% on 2/8  Pt sees Dr. Mellody Drown as a PCP and has been on metformin in the past. I spoke with daughter, Joelyn Oms who is on her was from [place], [place]. Daughter said she will call PCP to verify pt is prescribed and taking medications as he can be hard headed.  Spoke with pt and daughter about A1c level. Daughter had questions about when the pt is scheduled for cardiac cath tomorrow.  Need to d/c metformin if pt is on it  At time of d/c. Pt may benefit more from [place] 5 mg Daily which is cleared fecally.  Called pt's PCP office (Dr. Mellody Drown) they said he has not filled his meds since 2020, only one doctor's visit in 2021 and he has been a DM for years and is suppose to be on metformin and a statin. Told their office to fax to ED fax machine at [phone] attention for Dr. Manuella Ghazi.  Thanks,  Tama Headings RN, MSN, BC-ADM Inpatient Diabetes Coordinator Team Pager [phone] (8a-5p)

## 2021-01-29 NOTE — ED Notes (Signed)
Paged Dr. Paticia Stack to [facility] Browing

## 2021-01-29 NOTE — ED Notes (Signed)
ECHO at bedside.

## 2021-01-29 NOTE — ED Notes (Signed)
Secretary to order lunch tray

## 2021-01-29 NOTE — ED Notes (Signed)
US at bedside

## 2021-01-29 NOTE — ED Notes (Signed)
Urology MD at bedside for catheter placement.

## 2021-01-29 NOTE — ED Notes (Signed)
Date and time results received: 01/29/21  Test: Troponin  Critical Value: Gregory  Name of Provider Notified: Heath Lark MD

## 2021-01-29 NOTE — Progress Notes (Addendum)
This is an 80 year old male with history of hypertension who presented to the ED with complaints of chest pain and shortness of breath that suddenly began earlier on the evening of 2/7.  He was admitted with acute pulmonary edema and started on nitroglycerin infusion as well as IV Lasix for diuresis.  He was also noted to have elevated troponin levels with initial flat trend which was not thought to be consistent with ACS per cardiology.  Further troponin levels this morning have demonstrated a level of over 16,000 for which heparin drip has been initiated.  Patient remains on aspirin and statin as well.  He has been seen by cardiology with plans for cardiac catheterization.  Patient seen and evaluated this morning at bedside with no complaints of chest pain.  He did have some mild shortness of breath earlier this morning.  He was admitted after midnight.  Elevated troponin likely NSTEMI/ACS -Per Cardiology with plans for catheterization noted  -2D echocardiogram ordered and pending -Started on heparin drip -Continue nitroglycerin -Aspirin and statin   Hypertension -Currently controlled on nitroglycerin  Renal insufficiency -Question possible CKD IV -Check urine electrolytes -Check renal u/s -Monitor intake and output -Avoid nephrotoxic agents -Urinalysis without proteinuria -Monitor repeat labs in a.m.  Type 2 diabetes -Takes metformin BID which will be held; likely dc on discharge given renal function -Hemoglobin A1c 8.1% -Blood glucose levels over 300 -Plan to start SSI and consult diabetes coordinator  Newly diagnosed dyslipidemia -Total cholesterol 211 with LDL 129 -Started on statin per cardiology -TSH 1.85  Total care time: 45 minutes.

## 2021-01-29 NOTE — Procedures (Signed)
Pre-procedure diagnosis: difficult foley catheterization Post-procedure diagnosis: as above  Procedure performed: placement of complicated foley  Surgeon: Dr. Ardis Hughs  Findings: tight prostatic urethra, gross hematuria, ~700cc of bloody urine quickly returned Specimen: none  Drains:  47F coude tipped 3-way foley  Indications:  Patient unable to void on his own.  Nursing staff have been unsuccessful at placing catheter.  Procedure:  Gentials were prepped and draped in the routine sterile fashion.  10cc of 1% viscous lidocaine jelly was then injected into the patient's urethra.  A 47F coude tipped catheter was then gently passed in to the urethral.  Resistance was met at the prostate, but with gentle pressue the catheter slid through the prostate and into the bladder.  Clear yellow urine was returned.  Patient tolerated the procedure well - no immediate issues.  Disposition: Patient was started on CBI.

## 2021-01-29 NOTE — Progress Notes (Signed)
ANTICOAGULATION CONSULT NOTE - Initial Consult  Pharmacy Consult for heparin Indication: chest pain/ACS  No Known Allergies  Patient Measurements:   Heparin Dosing Weight: 67 kg (based on historical records)   Vital Signs: BP: 121/53 (02/08 2115) Pulse Rate: 65 (02/08 2115)  Labs: Recent Labs    01/28/21 2322 01/29/21 0043 01/29/21 0913 01/29/21 1210 01/29/21 1700 01/29/21 1945 01/29/21 2004  HGB 12.1*  --   --   --   --   --   --   HCT 37.9*  --   --   --   --   --   --   PLT 501*  --   --   --   --   --   --   HEPARINUNFRC  --   --   --   --   --   --  0.43  CREATININE 1.75*  --  2.27*  --   --   --   --   TROPONINIHS 238*   < > 14,097* 16,532* 14,215* 15,900*  --    < > = values in this interval not displayed.    CrCl cannot be calculated (Unknown ideal weight.).   Medical History: Past Medical History:  Diagnosis Date  . Hearing loss   . HTN (hypertension)     Medications:  (Not in a hospital admission)   Assessment: 4 YOM who presents with chest pain and SOB now with rising troponins to start IV heparin. Trops are trending down at this time.    H/H low, Plt high. SCr 1.75.   Goal of Therapy:  Heparin level 0.3-0.7 units/ml Monitor platelets by anticoagulation protocol: Yes   Plan:  - Heparin level therapeutic at 0.43 - Continue Heparin drip @ 800 u/h  - Repeat HL with AM labs  - Monitor for s/s of bleeding and cbc   Duanne Limerick, PharmD., BCPS Clinical Pharmacist Please refer to [facility] for unit-specific pharmacist

## 2021-01-29 NOTE — ED Notes (Signed)
Troponin elevated, critical result of 16,150 ng reported to [facility] , pt currently denies chest pain , EKG obtained. Hospitalist aware.

## 2021-01-29 NOTE — Progress Notes (Signed)
ANTICOAGULATION CONSULT NOTE - Initial Consult  Pharmacy Consult for heparin Indication: chest pain/ACS  Not on File  Patient Measurements:   Heparin Dosing Weight: 67 kg (based on historical records)   Vital Signs: Temp: 98.1 F (36.7 C) (02/07 2226) Temp Source: Oral (02/07 2226) BP: 154/72 (02/08 0845) Pulse Rate: 80 (02/08 0845)  Labs: Recent Labs    01/28/21 2322 01/29/21 0043 01/29/21 0725  HGB 12.1*  --   --   HCT 37.9*  --   --   PLT 501*  --   --   CREATININE 1.75*  --   --   TROPONINIHS 238* 266* 16,150*    CrCl cannot be calculated (Unknown ideal weight.).   Medical History: Past Medical History:  Diagnosis Date  . Hearing loss   . HTN (hypertension)     Medications:  (Not in a hospital admission)   Assessment: 54 YOM who presents with chest pain and SOB now with rising troponins to start IV heparin.    H/H low, Plt high. SCr 1.75.   Goal of Therapy:  Heparin level 0.3-0.7 units/ml Monitor platelets by anticoagulation protocol: Yes   Plan:  -Heparin 4000 units IV bolus followed by heparin infusion at 800 units/hr -F/u 8 hr HL -Monitor daily HL, CBC and s/s of bleeding  Albertina Parr, PharmD., BCPS, BCCCP Clinical Pharmacist Please refer to [facility] for unit-specific pharmacist

## 2021-01-29 NOTE — H&P (Signed)
History and Physical    Randy [name] GNF:[id number] DOB: 28-Feb-1941 DOA: 01/28/2021  PCP: Jilda Panda, MD  Patient coming from: Home  I have personally briefly reviewed patient's old medical records in [place]  Chief Complaint: CP, SOB  HPI: Randy [name] is a 80 y.o. male with medical history significant of HTN.  Pt doesn't routinely see doctors it sounds like.  Pt presents to ED with c/o CP and SOB.  Symptoms onset suddenly earlier this evening.  Never had symptoms like this before.  Symptoms are severe, persistent.  Not relieved by Neb treatments with EMS.  No N/V.  No fevers, chills, cough.  Has had COVID vaccine.   ED Course: BP as high as 200/109, WBC 15k, CXR shows pulm edema.  BNP 643, trops 238 and 266.  Creat 1.75, unk baseline.  Breathing and CP are improved significantly after PT put on BIPAP and NTG gtt is started.  Cards is consulted, didn't sound particularly impressed for NSTEMI however.   Review of Systems: As per HPI, otherwise all review of systems negative.  Past Medical History:  Diagnosis Date  . Hearing loss   . HTN (hypertension)     Past Surgical History:  Procedure Laterality Date  . EYE SURGERY       has no history on file for tobacco use, alcohol use, and drug use.  Not on File  History reviewed. No pertinent family history.   Prior to Admission medications   Medication Sig Start Date End Date Taking? Authorizing Provider  amLODipine (NORVASC) 5 MG tablet Take by mouth.    [provider]    Physical Exam: Vitals:   01/29/21 0045 01/29/21 0115 01/29/21 0145 01/29/21 0200  BP: (!) 156/72 (!) 165/84 (!) 200/109 (!) 156/75  Pulse: 92 92 (!) 106 85  Resp: 20 19 (!) 35 19  Temp:      TempSrc:      SpO2: 96% 99% 97% 99%    Constitutional: NAD, calm, comfortable Eyes: PERRL, lids and conjunctivae normal ENMT: Mucous membranes are moist. Posterior pharynx clear of any exudate or lesions.Normal dentition.   Neck: normal, supple, no masses, no thyromegaly Respiratory: B crackles Cardiovascular: Regular rate and rhythm, no murmurs / rubs / gallops. No extremity edema. 2+ pedal pulses. No carotid bruits.  Abdomen: no tenderness, no masses palpated. No hepatosplenomegaly. Bowel sounds positive.  Musculoskeletal: no clubbing / cyanosis. No joint deformity upper and lower extremities. Good ROM, no contractures. Normal muscle tone.  Skin: no rashes, lesions, ulcers. No induration Neurologic: CN 2-12 grossly intact. Sensation intact, DTR normal. Strength 5/5 in all 4.  Psychiatric: Normal judgment and insight. Alert and oriented x 3. Normal mood.    Labs on Admission: I have personally reviewed following labs and imaging studies  CBC: Recent Labs  Lab 01/28/21 2322  WBC 15.7*  NEUTROABS 12.3*  HGB 12.1*  HCT 37.9*  MCV 84.2  PLT 846*   Basic Metabolic Panel: Recent Labs  Lab 01/28/21 2322  NA 137  K 3.7  CL 103  CO2 20*  GLUCOSE 204*  BUN 30*  CREATININE 1.75*  CALCIUM 8.3*   GFR: CrCl cannot be calculated (Unknown ideal weight.). Liver Function Tests: No results for input(s): AST, ALT, ALKPHOS, BILITOT, PROT, ALBUMIN in the last 168 hours. No results for input(s): LIPASE, AMYLASE in the last 168 hours. No results for input(s): AMMONIA in the last 168 hours. Coagulation Profile: No results for input(s): INR, PROTIME in the last 168 hours.  Cardiac Enzymes: No results for input(s): CKTOTAL, CKMB, CKMBINDEX, TROPONINI in the last 168 hours. BNP (last 3 results) No results for input(s): PROBNP in the last 8760 hours. HbA1C: No results for input(s): HGBA1C in the last 72 hours. CBG: No results for input(s): GLUCAP in the last 168 hours. Lipid Profile: No results for input(s): CHOL, HDL, LDLCALC, TRIG, CHOLHDL, LDLDIRECT in the last 72 hours. Thyroid Function Tests: No results for input(s): TSH, T4TOTAL, FREET4, T3FREE, THYROIDAB in the last 72 hours. Anemia Panel: No  results for input(s): VITAMINB12, FOLATE, FERRITIN, TIBC, IRON, RETICCTPCT in the last 72 hours. Urine analysis:    Component Value Date/Time   COLORURINE YELLOW 08/06/2011 0313   APPEARANCEUR CLOUDY (A) 08/06/2011 0313   LABSPEC 1.022 08/06/2011 0313   PHURINE 5.0 08/06/2011 0313   GLUCOSEU NEGATIVE 08/06/2011 0313   HGBUR SMALL (A) 08/06/2011 0313   BILIRUBINUR NEGATIVE 08/06/2011 0313   KETONESUR NEGATIVE 08/06/2011 0313   PROTEINUR 100 (A) 08/06/2011 0313   UROBILINOGEN 0.2 08/06/2011 0313   NITRITE NEGATIVE 08/06/2011 0313   LEUKOCYTESUR NEGATIVE 08/06/2011 0313    Radiological Exams on Admission: DG Chest Port 1 View  Result Date: 01/28/2021 CLINICAL DATA:  Shortness of breath EXAM: PORTABLE CHEST 1 VIEW COMPARISON:  None. FINDINGS: Heart is borderline in size. Vascular congestion. Mild interstitial prominence could reflect interstitial edema. Right basilar opacity, likely atelectasis with small right effusion. IMPRESSION: Borderline heart size with vascular congestion and possible early interstitial edema. Small right effusion with right base atelectasis. Electronically Signed   By: Rolm Baptise M.D.   On: 01/28/2021 22:55    EKG: Independently reviewed.  Assessment/Plan Principal Problem:   New onset of congestive heart failure (HCC) Active Problems:   Renal insufficiency   HTN (hypertension)   Acute respiratory failure with hypoxia ([place])    1. New onset CHF - CP and flash pulmonary edema 1. DDx of cause: hypertensive emergency (more likely), and ACS (less likely given mostly flat trop). 2. Cards consulted 3. CHF pathway 4. BIPAP 5. NTG gtt 6. Got ASA 7. Messaging cards to see if they want him started on heparin gtt 8. Lasix 40mg  IV in ED then 40mg  IV daily 9. BMP daily 10. Strict intake and output 2. Acute resp failure - 1. stabilized on BIPAP 3. HTN - 1. NTG gtt for the moment 4. Renal insufficiency - 1. Repeat BMP in AM 2. UA pending 3. 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 Also checking A1C  DVT prophylaxis: Lovenox Code Status: Full Family Communication: No family in room Disposition Plan: Home after CHF improved Consults called: Dr. Paticia Stack Admission status: Admit to inpatient  Severity of Illness: The appropriate patient status for this patient is INPATIENT. Inpatient status is judged to be reasonable and necessary in order to provide the required intensity of service to ensure the patient's safety. The patient's presenting symptoms, physical exam findings, and initial radiographic and laboratory data in the context of their chronic comorbidities is felt to place them at high risk for further clinical deterioration. Furthermore, it is not anticipated that the patient will be medically stable for discharge from the hospital within 2 midnights of admission. The following factors support the patient status of inpatient.   IP status due to: 1) new onset CHF 2) acute resp failure requiring rescue BIPAP   * I certify that at the point of admission it is my clinical judgment that the patient will require inpatient hospital care spanning beyond 2 midnights from the point  of admission due to high intensity of service, high risk for further deterioration and high frequency of surveillance required.*    Gurjot Brisco M. DO [facility]  How to contact the [facility] Attending or Consulting provider [place] or covering provider during after hours [place], for this patient?  1. Check the care team in [facility] and look for a) attending/consulting [facility] provider listed and b) the [facility] team listed 2. Log into [email or web address]  Amion Physician Scheduling and messaging for groups and whole hospitals  On call and physician scheduling software for group practices, residents, hospitalists and other medical providers for call, clinic, rotation and shift schedules. On[employer] is a hospital-wide system for scheduling doctors and paging  doctors on call. EasyPlot is for scientific plotting and data analysis.  [email or web address]  and use [place]'s universal password to access. If you do not have the password, please contact the hospital operator.  3. Locate the [facility] provider you are looking for under [facility] and page to a number that you can be directly reached. 4. If you still have difficulty reaching the provider, please page the [facility] (Director on Call) for the Hospitalists listed on amion for assistance.  01/29/2021, 2:26 AM

## 2021-01-29 NOTE — ED Notes (Signed)
Bladder scan completed on pt...lowest result was 51ml.

## 2021-01-29 NOTE — Consult Note (Signed)
I have been asked to see the patient by Dr. Carlean Purl, for evaluation and management of difficult foley.  History of present illness: 42M who presented to the ED with acute coronary syndrome and was subsequently found to be in retention.  Nursing staff was able to perform CIC once and returned 600cc of urine.  Subsequent attempts to perform CIC was unsuccessful followed by unsuccessful attempts at placing a foley catheter.  According to the nurses, two attempts were made at placing a coude catheter.  The patient has been admitted and started on a heparin drip, with plans to proceed to the cath lab tomorrow.  At the time of my encounter the patient was having extreme bladder pain and only passing small amounts of blood.  The patient's history was difficult to ascertain due to his inabitlity to speak english and his lack of knowledge of his medical history.  Review of systems: A 12 point comprehensive review of systems was obtained and is negative unless otherwise stated in the history of present illness.  Patient Active Problem List   Diagnosis Date Noted  . Renal insufficiency 01/29/2021  . HTN (hypertension) 01/29/2021  . New onset of congestive heart failure ([place]) 01/29/2021  . Acute respiratory failure with hypoxia ([place]) 01/29/2021    No current facility-administered medications on file prior to encounter.   Current Outpatient Medications on File Prior to Encounter  Medication Sig Dispense Refill  . amLODipine (NORVASC) 5 MG tablet Take by mouth. (Patient not taking: Reported on 01/29/2021)    . colchicine 0.6 MG tablet Take 0.6-1.2 mg by mouth daily as needed. (Patient not taking: Reported on 01/29/2021)    . metFORMIN (GLUCOPHAGE) 500 MG tablet Take 500 mg by mouth 2 (two) times daily with a meal. (Patient not taking: No sig reported)      Past Medical History:  Diagnosis Date  . Hearing loss   . HTN (hypertension)     Past Surgical History:  Procedure Laterality Date  . EYE  SURGERY         History reviewed. No pertinent family history.  PE: Vitals:   01/29/21 1700 01/29/21 1730 01/29/21 1800 01/29/21 1830  BP: (!) 166/79     Pulse: 84 79 89 79  Resp: 19 16 (!) 25 20  Temp:      TempSrc:      SpO2: 98% 99% 97% 98%   Patient appears to moderate acute distress  patient is alert and oriented x3 Atraumatic normocephalic head No cervical or supraclavicular lymphadenopathy appreciated Slight increased work of breathing, no audible wheezes/rhonchi Regular sinus rhythm/rate Abdomen is soft, nontender, nondistended, no CVA or suprapubic tenderness Bladder palpably large. Normal male genitalia Lower extremities are symmetric without appreciable edema Grossly neurologically intact No identifiable skin lesions  Recent Labs    01/28/21 2322  WBC 15.7*  HGB 12.1*  HCT 37.9*   Recent Labs    01/28/21 2322 01/29/21 0913  NA 137 135  K 3.7 4.9  CL 103 100  CO2 20* 14*  GLUCOSE 204* 314*  BUN 30* 33*  CREATININE 1.75* 2.27*  CALCIUM 8.3* 8.6*   No results for input(s): LABPT, INR in the last 72 hours. No results for input(s): LABURIN in the last 72 hours. Results for orders placed or performed in visit on 02/08/20  Novel Coronavirus, NAA ([facility])     Status: None   Collection Time: 02/08/20  3:19 PM   Specimen: Nasopharyngeal(NP) swabs in vial transport medium   NASOPHARYNGE  TESTING  Result Value Ref Range Status   SARS-CoV-2, NAA Not Detected Not Detected Final    Comment: This nucleic acid amplification test was developed and its performance characteristics determined by [employer]. Nucleic acid amplification tests include RT-PCR and TMA. This test has not been FDA cleared or approved. This test has been authorized by FDA under an Emergency Use Authorization (EUA). This test is only authorized for the duration of time the declaration that circumstances exist justifying the authorization of the emergency use of in  vitro diagnostic tests for detection of SARS-CoV-2 virus and/or diagnosis of COVID-19 infection under section 564(b)(1) of the Act, 21 U.S.C. 998PJA-2(N) (1), unless the authorization is terminated or revoked sooner. When diagnostic testing is negative, the possibility of a false negative result should be considered in the context of a patient's recent exposures and the presence of clinical signs and symptoms consistent with COVID-19. An individual without symptoms of COVID-19 and who is not shedding SARS-CoV-2 virus wo uld expect to have a negative (not detected) result in this assay.     Imaging: none  Imp: Urinary retention of unknown etiology with false passage creating bleeding.  Recommendations: Catheter was placed and hte patient was started on CBI to avoid any clotting over the next several days as he goes through cardiac intervention and is anticoagulated.    - titrate CBI gtt to pink. - will continue to follow.    Ardis Hughs

## 2021-01-29 NOTE — Progress Notes (Addendum)
Patient seen and examined.  Was not started on heparin overnight as initial troponins relatively flat (238>266).  However third troponin returned 16150.  He is currently chest pain free.  Recommend: -Start heparin gtt -Start ASA 81 mg daily, start atorvastatin 80 mg daily -Continue nitroglycerin gtt, currently chest pain free -Echocardiogram -Given marked troponin elevation and presentation suggests acute heart failure, recommend cardiac catheterization.  Main issue is renal function, Cr 1.75, unclear baseline but notably was 2.6 in 07/2011, supect has CKD.  I spoke with patient with interpreter for over an hour.  He initially declined any procedures as wanted to talk with his family.  We could not get in touch with his children at first but ultimately were able to speak with both his son and daughter.  His daughter is a cardiac nurse in [place]  so is very familiar with heart catheterizations.  Risks and benefits of cardiac catheterization discussed with the patient and son/daughter.  These include bleeding, infection, kidney damage, stroke, heart attack, death.  The patient and son/daughter understands these risks and is willing to proceed.  ADDENDUM: Morning labs show worsening renal function (Cr 1.8 >2.3).  Suspect due to diuresis, will hold further lasix.  Will f/u echo today to evaluate systolic function.  D/w interventional cardiology, recommend holding off on cath today.  Plan hydration overnight and likely cath tomorrow if renal function stable.  He remains chest pain free.  If recurrent chest pain will need to go to cath sooner  BP remains elevated and he is on nitro gtt at 85 mcg/min.  Chest pain free.  Will add Bidil 1 tab TID and wean nitro gtt as tolerated.

## 2021-01-29 NOTE — Progress Notes (Signed)
Pt removed from BIPAP and placed on 2L [place]. Pt is tolerating well. RN aware.

## 2021-01-29 NOTE — ED Notes (Signed)
Date and time results received: 01/29/21  (use smartphrase ".now" to insert current time)  Test: Troponin Critical Value: 16,532  Name of Provider Notified: Heath Lark MD

## 2021-01-30 ENCOUNTER — Encounter (HOSPITAL_COMMUNITY)
Admission: EM | Disposition: A | Discharge status: Home Health | Payer: Self-pay | Source: Home / Self Care | Attending: Internal Medicine

## 2021-01-30 ENCOUNTER — Encounter (HOSPITAL_COMMUNITY): Payer: Self-pay | Admitting: Internal Medicine

## 2021-01-30 ENCOUNTER — Inpatient Hospital Stay (HOSPITAL_COMMUNITY): Payer: Medicare Other

## 2021-01-30 ENCOUNTER — Other Ambulatory Visit: Payer: Self-pay

## 2021-01-30 DIAGNOSIS — N189 Chronic kidney disease, unspecified: Secondary | ICD-10-CM | POA: Diagnosis not present

## 2021-01-30 DIAGNOSIS — J9601 Acute respiratory failure with hypoxia: Secondary | ICD-10-CM

## 2021-01-30 DIAGNOSIS — I5041 Acute combined systolic (congestive) and diastolic (congestive) heart failure: Secondary | ICD-10-CM | POA: Diagnosis not present

## 2021-01-30 DIAGNOSIS — I501 Left ventricular failure: Secondary | ICD-10-CM | POA: Diagnosis not present

## 2021-01-30 DIAGNOSIS — I10 Essential (primary) hypertension: Secondary | ICD-10-CM | POA: Diagnosis not present

## 2021-01-30 DIAGNOSIS — I214 Non-ST elevation (NSTEMI) myocardial infarction: Secondary | ICD-10-CM | POA: Diagnosis not present

## 2021-01-30 DIAGNOSIS — I5021 Acute systolic (congestive) heart failure: Secondary | ICD-10-CM

## 2021-01-30 DIAGNOSIS — J810 Acute pulmonary edema: Secondary | ICD-10-CM

## 2021-01-30 DIAGNOSIS — J811 Chronic pulmonary edema: Secondary | ICD-10-CM

## 2021-01-30 DIAGNOSIS — N179 Acute kidney failure, unspecified: Secondary | ICD-10-CM

## 2021-01-30 HISTORY — PX: IABP INSERTION: CATH118242

## 2021-01-30 HISTORY — PX: RIGHT/LEFT HEART CATH AND CORONARY ANGIOGRAPHY: CATH118266

## 2021-01-30 LAB — GLUCOSE, CAPILLARY
Glucose-Capillary: 144 mg/dL — ABNORMAL HIGH (ref 70–99)
Glucose-Capillary: 117 mg/dL — ABNORMAL HIGH (ref 70–99)
Glucose-Capillary: 117 mg/dL — ABNORMAL HIGH (ref 70–99)
Glucose-Capillary: 123 mg/dL — ABNORMAL HIGH (ref 70–99)
Glucose-Capillary: 116 mg/dL — ABNORMAL HIGH (ref 70–99)
Glucose-Capillary: 111 mg/dL — ABNORMAL HIGH (ref 70–99)

## 2021-01-30 LAB — CBC
HCT: 35.1 % — ABNORMAL LOW (ref 39.0–52.0)
Hemoglobin: 11 g/dL — ABNORMAL LOW (ref 13.0–17.0)
MCH: 26.1 pg (ref 26.0–34.0)
MCHC: 31.3 g/dL (ref 30.0–36.0)
MCV: 83.2 fL (ref 80.0–100.0)
Platelets: 493 10*3/uL — ABNORMAL HIGH (ref 150–400)
RBC: 4.22 MIL/uL (ref 4.22–5.81)
RDW: 13.3 % (ref 11.5–15.5)
WBC: 17.8 10*3/uL — ABNORMAL HIGH (ref 4.0–10.5)
nRBC: 0 % (ref 0.0–0.2)

## 2021-01-30 LAB — POCT I-STAT 7, (LYTES, BLD GAS, ICA,H+H)
Acid-Base Excess: 1 mmol/L (ref 0.0–2.0)
Bicarbonate: 25.9 mmol/L (ref 20.0–28.0)
Calcium, Ion: 1.25 mmol/L (ref 1.15–1.40)
HCT: 33 % — ABNORMAL LOW (ref 39.0–52.0)
Hemoglobin: 11.2 g/dL — ABNORMAL LOW (ref 13.0–17.0)
O2 Saturation: 96 %
Potassium: 4.2 mmol/L (ref 3.5–5.1)
Sodium: 138 mmol/L (ref 135–145)
TCO2: 27 mmol/L (ref 22–32)
pCO2 arterial: 40.4 mmHg (ref 32.0–48.0)
pH, Arterial: 7.415 (ref 7.350–7.450)
pO2, Arterial: 79 mmHg — ABNORMAL LOW (ref 83.0–108.0)

## 2021-01-30 LAB — POCT I-STAT EG7
Acid-Base Excess: 1 mmol/L (ref 0.0–2.0)
Acid-Base Excess: 2 mmol/L (ref 0.0–2.0)
Bicarbonate: 26 mmol/L (ref 20.0–28.0)
Bicarbonate: 27.5 mmol/L (ref 20.0–28.0)
Calcium, Ion: 1.21 mmol/L (ref 1.15–1.40)
Calcium, Ion: 1.24 mmol/L (ref 1.15–1.40)
HCT: 33 % — ABNORMAL LOW (ref 39.0–52.0)
HCT: 33 % — ABNORMAL LOW (ref 39.0–52.0)
Hemoglobin: 11.2 g/dL — ABNORMAL LOW (ref 13.0–17.0)
Hemoglobin: 11.2 g/dL — ABNORMAL LOW (ref 13.0–17.0)
O2 Saturation: 68 %
O2 Saturation: 69 %
Potassium: 4.1 mmol/L (ref 3.5–5.1)
Potassium: 4.2 mmol/L (ref 3.5–5.1)
Sodium: 139 mmol/L (ref 135–145)
Sodium: 139 mmol/L (ref 135–145)
TCO2: 27 mmol/L (ref 22–32)
TCO2: 29 mmol/L (ref 22–32)
pCO2, Ven: 43.7 mmHg — ABNORMAL LOW (ref 44.0–60.0)
pCO2, Ven: 45 mmHg (ref 44.0–60.0)
pH, Ven: 7.383 (ref 7.250–7.430)
pH, Ven: 7.394 (ref 7.250–7.430)
pO2, Ven: 36 mmHg (ref 32.0–45.0)
pO2, Ven: 37 mmHg (ref 32.0–45.0)

## 2021-01-30 LAB — SARS CORONAVIRUS 2 BY RT PCR (HOSPITAL ORDER, PERFORMED IN ~~LOC~~ HOSPITAL LAB): SARS Coronavirus 2: NEGATIVE

## 2021-01-30 LAB — BASIC METABOLIC PANEL
Anion gap: 13 (ref 5–15)
BUN: 48 mg/dL — ABNORMAL HIGH (ref 8–23)
CO2: 21 mmol/L — ABNORMAL LOW (ref 22–32)
Calcium: 9.2 mg/dL (ref 8.9–10.3)
Chloride: 104 mmol/L (ref 98–111)
Creatinine, Ser: 2.26 mg/dL — ABNORMAL HIGH (ref 0.61–1.24)
GFR, Estimated: 28 mL/min — ABNORMAL LOW (ref >60)
Glucose, Bld: 120 mg/dL — ABNORMAL HIGH (ref 70–99)
Potassium: 4.3 mmol/L (ref 3.5–5.1)
Sodium: 138 mmol/L (ref 135–145)

## 2021-01-30 LAB — HEPATIC FUNCTION PANEL
ALT: 31 U/L (ref 0–44)
AST: 57 U/L — ABNORMAL HIGH (ref 15–41)
Albumin: 3.6 g/dL (ref 3.5–5.0)
Alkaline Phosphatase: 49 U/L (ref 38–126)
Bilirubin, Direct: <0.1 mg/dL (ref 0.0–0.2)
Total Bilirubin: 0.6 mg/dL (ref 0.3–1.2)
Total Protein: 6.5 g/dL (ref 6.5–8.1)

## 2021-01-30 LAB — MAGNESIUM: Magnesium: 1.8 mg/dL (ref 1.7–2.4)

## 2021-01-30 LAB — CBG MONITORING, ED: Glucose-Capillary: 137 mg/dL — ABNORMAL HIGH (ref 70–99)

## 2021-01-30 LAB — HEPARIN LEVEL (UNFRACTIONATED): Heparin Unfractionated: 0.32 IU/mL (ref 0.30–0.70)

## 2021-01-30 SURGERY — RIGHT/LEFT HEART CATH AND CORONARY ANGIOGRAPHY
Anesthesia: LOCAL

## 2021-01-30 MED ORDER — SODIUM CHLORIDE 0.9% FLUSH: 3.0000 mL | INTRAVENOUS | Status: DC | PRN

## 2021-01-30 MED ORDER — SODIUM CHLORIDE 0.9 % IV SOLN: INTRAVENOUS | Status: AC

## 2021-01-30 MED ORDER — HEPARIN SODIUM (PORCINE) 1000 UNIT/ML IJ SOLN
INTRAMUSCULAR | Status: AC
  Filled 2021-01-30: qty 1

## 2021-01-30 MED ORDER — SODIUM CHLORIDE 0.9% FLUSH
3.0000 mL | Freq: Two times a day (BID) | INTRAVENOUS | Status: DC
  Administered 2021-01-31 – 2021-02-05 (×8): 3 mL via INTRAVENOUS

## 2021-01-30 MED ORDER — MIDAZOLAM HCL 2 MG/2ML IJ SOLN
INTRAMUSCULAR | Status: AC
  Filled 2021-01-30: qty 2

## 2021-01-30 MED ORDER — HEPARIN SODIUM (PORCINE) 1000 UNIT/ML IJ SOLN
INTRAMUSCULAR | Status: DC | PRN
  Administered 2021-01-30: 4000 [IU] via INTRAVENOUS

## 2021-01-30 MED ORDER — HEPARIN (PORCINE) IN NACL 1000-0.9 UT/500ML-% IV SOLN
INTRAVENOUS | Status: DC | PRN
  Administered 2021-01-30 (×2): 500 mL

## 2021-01-30 MED ORDER — SODIUM CHLORIDE 0.9 % IV SOLN: INTRAVENOUS | Status: DC

## 2021-01-30 MED ORDER — SODIUM CHLORIDE 0.9 % IV SOLN: 250.0000 mL | INTRAVENOUS | Status: DC | PRN

## 2021-01-30 MED ORDER — LABETALOL HCL 5 MG/ML IV SOLN
10.0000 mg | INTRAVENOUS | Status: AC | PRN
  Filled 2021-01-30 (×2): qty 4

## 2021-01-30 MED ORDER — VERAPAMIL HCL 2.5 MG/ML IV SOLN
INTRAVENOUS | Status: DC | PRN
  Administered 2021-01-30: 10 mL via INTRA_ARTERIAL

## 2021-01-30 MED ORDER — LIDOCAINE HCL (PF) 1 % IJ SOLN
INTRAMUSCULAR | Status: AC
  Filled 2021-01-30: qty 30

## 2021-01-30 MED ORDER — ASPIRIN EC 81 MG PO TBEC: 81.0000 mg | DELAYED_RELEASE_TABLET | Freq: Every day | ORAL | Status: DC

## 2021-01-30 MED ORDER — MIDAZOLAM HCL 2 MG/2ML IJ SOLN
INTRAMUSCULAR | Status: DC | PRN
  Administered 2021-01-30: 1 mg via INTRAVENOUS

## 2021-01-30 MED ORDER — IOHEXOL 350 MG/ML SOLN
INTRAVENOUS | Status: DC | PRN
  Administered 2021-01-30: 60 mL

## 2021-01-30 MED ORDER — ASPIRIN 81 MG PO CHEW
81.0000 mg | CHEWABLE_TABLET | ORAL | Status: AC
  Administered 2021-01-30: 81 mg via ORAL
  Filled 2021-01-30: qty 1

## 2021-01-30 MED ORDER — HYDRALAZINE HCL 20 MG/ML IJ SOLN
10.0000 mg | INTRAMUSCULAR | Status: AC | PRN
Start: 2021-01-30 — End: 2021-01-30

## 2021-01-30 MED ORDER — LIDOCAINE HCL (PF) 1 % IJ SOLN
INTRAMUSCULAR | Status: DC | PRN
  Administered 2021-01-30: 10 mL
  Administered 2021-01-30 (×2): 2 mL

## 2021-01-30 MED ORDER — ASPIRIN 81 MG PO CHEW: 81.0000 mg | CHEWABLE_TABLET | ORAL | Status: DC

## 2021-01-30 MED ORDER — VERAPAMIL HCL 2.5 MG/ML IV SOLN
INTRAVENOUS | Status: AC
  Filled 2021-01-30: qty 2

## 2021-01-30 MED ORDER — CHLORHEXIDINE GLUCONATE CLOTH 2 % EX PADS
6.0000 | MEDICATED_PAD | Freq: Every day | CUTANEOUS | Status: DC
  Administered 2021-01-30 – 2021-02-04 (×5): 6 via TOPICAL

## 2021-01-30 MED ORDER — HEPARIN (PORCINE) IN NACL 1000-0.9 UT/500ML-% IV SOLN
INTRAVENOUS | Status: AC
  Filled 2021-01-30: qty 1000

## 2021-01-30 MED ORDER — HEPARIN (PORCINE) 25000 UT/250ML-% IV SOLN
850.0000 [IU]/h | INTRAVENOUS | Status: DC
  Administered 2021-01-30: 700 [IU]/h via INTRAVENOUS
  Filled 2021-01-30: qty 250

## 2021-01-30 SURGICAL SUPPLY — 15 items
BALLN IABP SENSA PLUS 7.5F 40C (BALLOONS) ×2
BALLOON IABP SENS PLUS 7.5F40C (BALLOONS) IMPLANT
CATH BALLN WEDGE 5F 110CM (CATHETERS) ×1 IMPLANT
CATH OPTITORQUE TIG 4.0 5F (CATHETERS) ×1 IMPLANT
DEVICE RAD TR BAND REGULAR (VASCULAR PRODUCTS) ×1 IMPLANT
GLIDESHEATH SLEND SS 6F .021 (SHEATH) ×1 IMPLANT
GUIDEWIRE INQWIRE 1.5J.035X260 (WIRE) IMPLANT
INQWIRE 1.5J .035X260CM (WIRE) ×2
KIT HEART LEFT (KITS) ×2 IMPLANT
PACK CARDIAC CATHETERIZATION (CUSTOM PROCEDURE TRAY) ×2 IMPLANT
SHEATH GLIDE SLENDER 4/5FR (SHEATH) ×1 IMPLANT
SHEATH PINNACLE 6F 10CM (SHEATH) ×1 IMPLANT
TRANSDUCER W/STOPCOCK (MISCELLANEOUS) ×2 IMPLANT
TUBING CIL FLEX 10 FLL-RA (TUBING) ×2 IMPLANT
WIRE EMERALD 3MM-J .035X150CM (WIRE) ×1 IMPLANT

## 2021-01-30 NOTE — Progress Notes (Signed)
PROGRESS NOTE    Randy [name]  HYQ:[id number] DOB: 06/17/1941 DOA: 01/28/2021 PCP: Jilda Panda, MD    No chief complaint on file.   Brief Narrative:  Randy [name] is a 80 y.o. male with medical history significant of HTN, presents with chest pain and sob.  Meanwhile pt had traumatic foley placement resulting in hematuria on CBI urine, remains lightly bloody on Slow CBI gtt. Urology consulted and he underwent coude catheter placement.  Pt was also started on nitro gtt for chest pain IV heparin and cardiology consulted.  Underwent cardiac cath showing Severe Multivessel CAD and Moderately reduced LVEF 35 to 40% by Echocardiogram. He was transferred to ICU and CVTS consulted .     Assessment & Plan:   Principal Problem:   New onset of congestive heart failure (HCC) Active Problems:   Renal insufficiency   Hypertensive crisis   Acute respiratory failure with hypoxia (HCC)   Acute pulmonary edema (HCC)   AKI (acute kidney injury) ([place])   Non-STEMI (non-ST elevated myocardial infarction) ([place])   NSTEMI;  Pt presents with chest pain and sob.  Troponin peaked at 16,532.  Echo showed akinesis of inferolateral wall with hypokinesis of inferior wall , LVEF of 35% to 40 %.  Continue with aspirin, statin, nitro gtt.  Cardiac cath today showed multivessel CAD and reduced LVEF 35%.  CVTS consult.    Acute combined systolic and diastolic heart failure.  Initially diuresed with IV lasix, renal function worsened and lasix held.     Hematuria from traumatic foley placement resulting in hematuria on CBI urine, remains lightly bloody on Slow CBI gtt. Urology consulted and he underwent coude catheter placement. flomax once pt stabilizes.   Stage 4 CKD:  Creatinine stable around 2.2.       DVT prophylaxis: heparin.  Code Status: (Full code) Family Communication: none at bedside.  Disposition:   Status is: Inpatient  Remains inpatient appropriate because:Ongoing  diagnostic testing needed not appropriate for outpatient work up, Unsafe d/c plan, IV treatments appropriate due to intensity of illness or inability to take PO and Inpatient level of care appropriate due to severity of illness   Dispo: The patient is from: Home              Anticipated d/c is to: Home              Anticipated d/c date is: > 3 days              Patient currently is not medically stable to d/c.   Difficult to place patient No       Level of care: ICU Consultants:   Cardiology.    Procedures: cardiac cath.    Antimicrobials: none.    Subjective: No chest pain or sob.   Objective: Vitals:   01/30/21 1416 01/30/21 1421 01/30/21 1426 01/30/21 1431  BP: 132/64 136/66 (!) 132/58   Pulse: (!) 59 61 60 63  Resp: 17 16 14 12   Temp:      TempSrc:      SpO2: 96% 97% 98% 97%  Weight:      Height:        Intake/Output Summary (Last 24 hours) at 01/30/2021 1531 Last data filed at 01/30/2021 1226 Gross per 24 hour  Intake 3948.38 ml  Output 14550 ml  Net -10601.62 ml   Filed Weights   01/30/21 0119  Weight: 68.6 kg    Examination:  General exam: Appears calm and comfortable  Respiratory system: Clear  to auscultation. Respiratory effort normal. Cardiovascular system: S1 & S2 heard, RRR. No JVD, no pedal edema.  Gastrointestinal system: Abdomen is nondistended, soft and nontender. . Normal bowel sounds heard. Central nervous system: Alert and oriented. No focal neurological deficits. Extremities: Symmetric 5 x 5 power. Skin: No rashes, lesions or ulcers Psychiatry: . Mood & affect appropriate.     Data Reviewed: I have personally reviewed following labs and imaging studies  CBC: Recent Labs  Lab 01/28/21 2322 01/30/21 0442  WBC 15.7* 17.8*  NEUTROABS 12.3*  --   HGB 12.1* 11.0*  HCT 37.9* 35.1*  MCV 84.2 83.2  PLT 501* 493*    Basic Metabolic Panel: Recent Labs  Lab 01/28/21 2322 01/29/21 0913 01/30/21 0442  NA 137 135 138  K 3.7 4.9  4.3  CL 103 100 104  CO2 20* 14* 21*  GLUCOSE 204* 314* 120*  BUN 30* 33* 48*  CREATININE 1.75* 2.27* 2.26*  CALCIUM 8.3* 8.6* 9.2  MG  --   --  1.8    GFR: Estimated Creatinine Clearance: 24.9 mL/min (A) (by C-G formula based on SCr of 2.26 mg/dL (H)).  Liver Function Tests: Recent Labs  Lab 01/30/21 0442  AST 57*  ALT 31  ALKPHOS 49  BILITOT 0.6  PROT 6.5  ALBUMIN 3.6    CBG: Recent Labs  Lab 01/30/21 0009 01/30/21 0108 01/30/21 0417 01/30/21 0727 01/30/21 1224  GLUCAP 137* 144* 117* 117* 123*     Recent Results (from the past 240 hour(s))  SARS Coronavirus 2 by RT PCR (hospital order, performed in [facility] hospital lab) Nasopharyngeal Nasopharyngeal Swab     Status: None   Collection Time: 01/30/21  4:41 AM   Specimen: Nasopharyngeal Swab  Result Value Ref Range Status   SARS Coronavirus 2 NEGATIVE NEGATIVE Final    Comment: (NOTE) SARS-CoV-2 target nucleic acids are NOT DETECTED.  The SARS-CoV-2 RNA is generally detectable in upper and lower respiratory specimens during the acute phase of infection. The lowest concentration of SARS-CoV-2 viral copies this assay can detect is 250 copies / mL. A negative result does not preclude SARS-CoV-2 infection and should not be used as the sole basis for treatment or other patient management decisions.  A negative result may occur with improper specimen collection / handling, submission of specimen other than nasopharyngeal swab, presence of viral mutation(s) within the areas targeted by this assay, and inadequate number of viral copies (<250 copies / mL). A negative result must be combined with clinical observations, patient history, and epidemiological information.  Fact Sheet for Patients:   StrictlyIdeas.no  Fact Sheet for Healthcare Providers: BankingDealers.co.za  This test is not yet approved or  cleared by the [place] FDA and has been authorized for  detection and/or diagnosis of SARS-CoV-2 by FDA under an Emergency Use Authorization (EUA).  This EUA will remain in effect (meaning this test can be used) for the duration of the COVID-19 declaration under Section 564(b)(1) of the Act, 21 U.S.C. section 360bbb-3(b)(1), unless the authorization is terminated or revoked sooner.  Performed at [facility], [place] [address] [place], [place] [address]          Radiology Studies: CARDIAC CATHETERIZATION  Result Date: 1/0/1751  LV end diastolic pressure is moderately elevated.  There is no aortic valve stenosis.  Hemodynamic findings consistent with mild pulmonary hypertension. With mildly elevated LVEDP. Normal cardiac output and index by Fick  -------------------------  Prox RCA to Mid RCA lesion is 50% stenosed.  Dist RCA lesion is 99% (subtotal occlusion) stenosed with 90% stenosed side branch in RPAV. Distal RPL fills via left-to-right collaterals  RPDA lesion is 100% stenosed. PDA fills via septal collaterals  Ostial LM 50% stenosis. Dist LM to Ost LAD lesion is 60% stenosed with 80% stenosed side branch in Ost Cx.  Mid LAD lesion is 90% stenosed with 40% stenosed side branch in 2nd Diag.  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  CVTS consulted.  Per request, we will check a noncontrasted chest CT  Dr. Gardiner Rhyme has agreed to discussed the case with the patient's daughter who is a cardiac nurse.  With the language barrier this is very  difficult discussion having he is extremely scared.  At this point I think his only real option is bypass surgery.  IV heparin was started per pharmacy protocol for IABP. He was hemodynamically stable, chest pain-free and breathing well leave the Cath Lab. Glenetta Hew, MD   US RENAL  Result Date: 01/29/2021 CLINICAL DATA:  AKI EXAM: RENAL / URINARY TRACT ULTRASOUND COMPLETE COMPARISON:  10/15/2018 and prior. FINDINGS: Right Kidney: Renal measurements: 8.9 x 4.5 x 4.9 cm = volume: 102.7 mL. Diffuse cortical thinning. Echogenicity within normal limits. No mass or hydronephrosis visualized. Left Kidney: Renal measurements: 9.4 x 5.5 x 5.3 cm. = volume: 143.9 mL. Diffuse cortical thinning. Echogenicity within normal limits. No mass or hydronephrosis visualized. Bladder: Appears normal for degree of bladder distention. Other: Enlarged prostate measuring 6.5 x 3.9 x 5.0 cm. IMPRESSION: Bilateral cortical atrophy. Otherwise unremarkable sonographic appearance of the kidneys. Prostatomegaly. Electronically Signed   By: Primitivo Gauze M.D.   On: 01/29/2021 12:55   DG Chest Port 1 View  Result Date: 01/28/2021 CLINICAL DATA:  Shortness of breath EXAM: PORTABLE CHEST 1 VIEW COMPARISON:  None. FINDINGS: Heart is borderline in size. Vascular congestion. Mild interstitial prominence could reflect interstitial edema. Right basilar opacity, likely atelectasis with small right effusion. IMPRESSION: Borderline heart size with vascular congestion and possible early interstitial edema. Small right effusion with right base atelectasis. Electronically Signed   By: Rolm Baptise M.D.   On: 01/28/2021 22:55   ECHOCARDIOGRAM COMPLETE  Result Date: 01/29/2021    ECHOCARDIOGRAM REPORT   Patient Name:   Randy [name] Date of Exam: 01/29/2021 Medical Rec #:  [id number]          Height: Accession #:    [id number]         Weight: Date of Birth:  Apr 29, 1941           BSA: Patient Age:    25 years           BP:           181/76 mmHg  Patient Gender: M                  HR:           73 bpm. Exam Location:  Inpatient Procedure: 2D Echo, Cardiac Doppler, Color Doppler and Strain Analysis  Indications:    CHF-Acute Systolic T24.58  History:        Patient has no prior history of Echocardiogram examinations.                 Signs/Symptoms:Chest Pain; Risk Factors:Hypertension, Diabetes                 and Dyslipidemia. Elevated Troponin.  Sonographer:    Darlina Sicilian RDCS Referring Phys: [place]  1. Akinesis of the inferolateral wall; hypokinesis of the inferior wall; overall moderate LV dysfunction.  2. Left ventricular ejection fraction, by estimation, is 35 to 40%. The left ventricle has moderately decreased function. The left ventricle demonstrates regional wall motion abnormalities (see scoring diagram/findings for description). There is moderate left ventricular hypertrophy. Left ventricular diastolic parameters are consistent with Grade I diastolic dysfunction (impaired relaxation).  3. Right ventricular systolic function is normal. The right ventricular size is normal.  4. The mitral valve is normal in structure. Trivial mitral valve regurgitation. No evidence of mitral stenosis.  5. The aortic valve is tricuspid. Aortic valve regurgitation is mild. Mild aortic valve sclerosis is present, with no evidence of aortic valve stenosis. FINDINGS  Left Ventricle: Left ventricular ejection fraction, by estimation, is 35 to 40%. The left ventricle has moderately decreased function. The left ventricle demonstrates regional wall motion abnormalities. The left ventricular internal cavity size was normal in size. There is moderate left ventricular hypertrophy. Left ventricular diastolic parameters are consistent with Grade I diastolic dysfunction (impaired relaxation). Right Ventricle: The right ventricular size is normal. Right ventricular systolic function is normal. Left Atrium: Left atrial size was normal in size. Right  Atrium: Right atrial size was normal in size. Pericardium: There is no evidence of pericardial effusion. Mitral Valve: The mitral valve is normal in structure. Trivial mitral valve regurgitation. No evidence of mitral valve stenosis. Tricuspid Valve: The tricuspid valve is normal in structure. Tricuspid valve regurgitation is trivial. No evidence of tricuspid stenosis. Aortic Valve: The aortic valve is tricuspid. Aortic valve regurgitation is mild. Aortic regurgitation PHT measures 419 msec. Mild aortic valve sclerosis is present, with no evidence of aortic valve stenosis. Pulmonic Valve: The pulmonic valve was not well visualized. Pulmonic valve regurgitation is trivial. No evidence of pulmonic stenosis. Aorta: The aortic root is normal in size and structure. Venous: The inferior vena cava was not well visualized.  Additional Comments: Akinesis of the inferolateral wall; hypokinesis of the inferior wall; overall moderate LV dysfunction.  LEFT VENTRICLE PLAX 2D LVIDd:         4.70 cm LVIDs:         3.70 cm LV PW:         1.30 cm LV IVS:        1.50 cm LVOT diam:     2.00 cm LVOT Area:     3.14 cm  LV Volumes (MOD) LV vol d, MOD A2C: 107.0 ml LV vol d, MOD A4C: 167.0 ml LV vol s, MOD A2C: 61.2 ml LV vol s, MOD A4C: 93.9 ml LV SV MOD A2C:     45.8 ml LV SV MOD A4C:     167.0 ml LV SV MOD BP:      62.5 ml RIGHT VENTRICLE RV S prime:     18.30 cm/s TAPSE (M-mode): 1.2 cm LEFT ATRIUM LA diam:      4.50 cm LA Vol (A2C): 46.9 ml LA Vol (A4C): 63.6 ml  AORTIC VALVE AI PHT:  419 msec  AORTA Ao Root diam: 3.20 cm Ao Asc diam:  3.30 cm  SHUNTS Systemic Diam: 2.00 cm Kirk Ruths MD Electronically signed by Kirk Ruths MD Signature Date/Time: 01/29/2021/12:59:05 PM    Final         Scheduled Meds: . Derrill Memo ON 01/31/2021] aspirin EC  81 mg Oral Daily  . atorvastatin  80 mg Oral Daily  . Chlorhexidine Gluconate Cloth  6 each Topical Daily  . insulin aspart  0-9 Units Subcutaneous Q4H  . isosorbide-hydrALAZINE   1 tablet Oral TID  . sodium chloride flush  3 mL Intravenous Q12H  . sodium chloride flush  3 mL Intravenous Q12H  . sodium chloride flush  3 mL Intravenous Q12H   Continuous Infusions: . sodium chloride 20 mL/hr at 01/30/21 1505  . sodium chloride    . sodium chloride    . heparin    . sodium chloride irrigation       LOS: 1 day        Hosie Poisson, MD [facility]   To contact the attending provider between 7A-7P or the covering provider during after hours 7P-7A, please log into the web site [email or web address] and access using universal [place] password for that web site. If you do not have the password, please call the hospital operator.  01/30/2021, 3:31 PM

## 2021-01-30 NOTE — Consult Note (Addendum)
Advanced Heart Failure Team Consult Note   Primary Physician: Jilda Panda, MD PCP-Cardiologist:  No primary care provider on file.  Reason for Consultation: Acute Systolic Heart Failure   HPI:    Randy [name] is seen today for evaluation of acute systolic heart failure at the request of Dr. Gardiner Rhyme, Cardiology.    80 y/o male w/ h/o HTN and Type 2DM but no prior cardiac history, admitted for acute NSTEMI.   Initially presented w/ CP and acute pulmonary edema. Troponin peaked at 16,532. Echo  showed moderately reduced LVEF 35-40% and AK of inferolateral wall/ HK of inferior wall. RV normal. Started on heparin + nitro gtt and IV Lasix.    R/LHC done today.   Severe Multivessel CAD:  ? Ostial LM 40 to 50%, distal LM 50 to 60% involving ostial LCx 80;   mid LAD 90% (at major septal and diagonal branches);  ? subtotal occluded distal RCA at PDA with 99% lesion into PAV-PL (PDA fills via septal collaterals, PL fills via LCx collaterals)  Moderately reduced LVEF 35 to 40% by Echocardiogram; Cardiac Output-Index (Fick) 6.05, 3.29  IABP placed in cath lab. Pt transferred to CCU and awaiting surgical consultation for potential CABG. Dr. Cyndia Bent to see today.   WBC 18K but AF. Hgb 11. CO2 21, SCr 2.26   IABP at 1:1    [facility] 01/31/20 SUMMARY  Severe Multivessel CAD:  ? Ostial LM 40 to 50%, distal LM 50 to 60% involving ostial LCx 80;   mid LAD 90% (at major septal and diagonal branches);  ? 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 Akinesis of the inferolateral wall; hypokinesis of the inferior wall;  overall moderate LV dysfunction.  2. Left ventricular ejection fraction, by  estimation, is 35 to 40%. The  left ventricle has moderately decreased function. The left ventricle  demonstrates regional wall motion abnormalities (see scoring  diagram/findings for description). There is  moderate left ventricular hypertrophy. Left ventricular diastolic  parameters are consistent with Grade I diastolic dysfunction (impaired  relaxation).  3. Right ventricular systolic function is normal. The right ventricular  size is normal.  4. The mitral valve is normal in structure. Trivial mitral valve  regurgitation. No evidence of mitral stenosis.  5. The aortic valve is tricuspid. Aortic valve regurgitation is mild.  Mild aortic valve sclerosis is present, with no evidence of aortic valve  stenosis.   Review of Systems: [y] = yes, [ ]  = no   . General: Weight gain [ ] ; Weight loss [ ] ; Anorexia [ ] ; Fatigue [ ] ; Fever [ ] ; Chills [ ] ; Weakness [ ]   . Cardiac: Chest pain/pressure [ Y]; Resting SOB [ ] ; Exertional SOB [Y ]; Orthopnea [ ] ; Pedal Edema [ ] ; Palpitations [ ] ; Syncope [ ] ; Presyncope [ ] ; Paroxysmal nocturnal dyspnea[ ]   . Pulmonary: Cough [ ] ; Wheezing[ ] ; Hemoptysis[ ] ; Sputum [ ] ; Snoring [ ]   . GI: Vomiting[ ] ; Dysphagia[ ] ; Melena[ ] ; Hematochezia [ ] ; Heartburn[ ] ; Abdominal pain [ ] ; Constipation [ ] ; Diarrhea [ ] ; BRBPR [ ]   . GU: Hematuria[ ] ; Dysuria [ ] ; Nocturia[ ]   . Vascular: Pain in legs with walking [ ] ; Pain  in feet with lying flat [ ] ; Non-healing sores [ ] ; Stroke [ ] ; TIA [ ] ; Slurred speech [ ] ;  . Neuro: Headaches[ ] ; Vertigo[ ] ; Seizures[ ] ; Paresthesias[ ] ;Blurred vision [ ] ; Diplopia [ ] ; Vision changes [ ]   . Ortho/Skin: Arthritis [ ] ; Joint pain [ ] ; Muscle pain [ ] ; Joint swelling [ ] ; Back Pain [ ] ; Rash [ ]   . Psych: Depression[ ] ; Anxiety[ ]   . Heme: Bleeding problems [ ] ; Clotting disorders [ ] ; Anemia [ ]   . Endocrine: Diabetes [ ] ; Thyroid dysfunction[ ]   Home Medications Prior to Admission medications   Medication Sig Start  Date End Date Taking? Authorizing Provider  amLODipine (NORVASC) 5 MG tablet Take by mouth. Patient not taking: Reported on 01/29/2021    [provider]  colchicine 0.6 MG tablet Take 0.6-1.2 mg by mouth daily as needed. Patient not taking: Reported on 01/29/2021    [provider]  metFORMIN (GLUCOPHAGE) 500 MG tablet Take 500 mg by mouth 2 (two) times daily with a meal. Patient not taking: No sig reported    [provider]    Past Medical History: Past Medical History:  Diagnosis Date  . Hearing loss   . HTN (hypertension)     Past Surgical History: Past Surgical History:  Procedure Laterality Date  . EYE SURGERY      Family History: History reviewed. No pertinent family history.  Social History: Social History   Socioeconomic History  . Marital status: Married    Spouse name: Not on file  . Number of children: Not on file  . Years of education: Not on file  . Highest education level: Not on file  Occupational History  . Not on file  Tobacco Use  . Smoking status: Former Smoker    Types: Cigarettes  . Smokeless tobacco: Not on file  Substance and Sexual Activity  . Alcohol use: Not on file  . Drug use: Not on file  . Sexual activity: Not on file  Other Topics Concern  . Not on file  Social History Narrative  . Not on file   Social Determinants of Health   Financial Resource Strain: Not on file  Food Insecurity: Not on file  Transportation Needs: Not on file  Physical Activity: Not on file  Stress: Not on file  Social Connections: Not on file    Allergies:  No Known Allergies  Objective:    Vital Signs:   Temp:  [97.7 F (36.5 C)-98.5 F (36.9 C)] 98.5 F (36.9 C) (02/09 1221) Pulse Rate:  [55-109] 66 (02/09 1600) Resp:  [8-73] 26 (02/09 1600) BP: (114-181)/(52-149) 145/60 (02/09 1600) SpO2:  [89 %-100 %] 96 % (02/09 1600) Weight:  [68.6 kg] 68.6 kg (02/09 0119)    Weight change: Filed Weights   01/30/21 0119   Weight: 68.6 kg    Intake/Output:   Intake/Output Summary (Last 24 hours) at 01/30/2021 1619 Last data filed at 01/30/2021 1600 Gross per 24 hour  Intake 4444.11 ml  Output 15350 ml  Net -10905.89 ml      Physical Exam    General:  Elderly male. No resp difficulty HEENT: normal Neck: supple. JVP.  Carotids 2+ bilat; no bruits. No lymphadenopathy or thyromegaly appreciated. Cor: PMI nondisplaced. Regular rate & rhythm. No rubs, gallops or murmurs. Lungs: clear Abdomen: soft, nontender, nondistended. No hepatosplenomegaly. No bruits or masses. Good bowel sounds. Extremities: no cyanosis, clubbing, rash, edema + Rt femoral IABP  Neuro: alert & orientedx3,  cranial nerves grossly intact. moves all 4 extremities w/o difficulty. Affect pleasant   Telemetry   NSR 61 bpm   EKG    NSR 81 bpm Left posterior fascicular block  Labs   Basic Metabolic Panel: Recent Labs  Lab 01/28/21 2322 01/29/21 0913 01/30/21 0442  NA 137 135 138  K 3.7 4.9 4.3  CL 103 100 104  CO2 20* 14* 21*  GLUCOSE 204* 314* 120*  BUN 30* 33* 48*  CREATININE 1.75* 2.27* 2.26*  CALCIUM 8.3* 8.6* 9.2  MG  --   --  1.8    Liver Function Tests: Recent Labs  Lab 01/30/21 0442  AST 57*  ALT 31  ALKPHOS 49  BILITOT 0.6  PROT 6.5  ALBUMIN 3.6   No results for input(s): LIPASE, AMYLASE in the last 168 hours. No results for input(s): AMMONIA in the last 168 hours.  CBC: Recent Labs  Lab 01/28/21 2322 01/30/21 0442  WBC 15.7* 17.8*  NEUTROABS 12.3*  --   HGB 12.1* 11.0*  HCT 37.9* 35.1*  MCV 84.2 83.2  PLT 501* 493*    Cardiac Enzymes: No results for input(s): CKTOTAL, CKMB, CKMBINDEX, TROPONINI in the last 168 hours.  BNP: BNP (last 3 results) Recent Labs    01/28/21 2322  BNP 643.2*    ProBNP (last 3 results) No results for input(s): PROBNP in the last 8760 hours.   CBG: Recent Labs  Lab 01/30/21 0009 01/30/21 0108 01/30/21 0417 01/30/21 0727 01/30/21 1224  GLUCAP  137* 144* 117* 117* 123*    Coagulation Studies: No results for input(s): LABPROT, INR in the last 72 hours.   Imaging   CARDIAC CATHETERIZATION  Result Date: 03/22/9378  LV end diastolic pressure is moderately elevated.  There is no aortic valve stenosis.  Hemodynamic findings consistent with mild pulmonary hypertension. With mildly elevated LVEDP. Normal cardiac output and index by Fick  -------------------------  Prox RCA to Mid RCA lesion is 50% stenosed.  Dist RCA lesion is 99% (subtotal occlusion) stenosed with 90% stenosed side branch in RPAV. Distal RPL fills via left-to-right collaterals  RPDA lesion is 100% stenosed. PDA fills via septal collaterals  Ostial LM 50% stenosis. Dist LM to Ost LAD lesion is 60% stenosed with 80% stenosed side branch in Ost Cx.  Mid LAD lesion is 90% stenosed with 40% stenosed side branch in 2nd Diag.  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  CVTS consulted.  Per request, we will check a noncontrasted chest CT  Dr. Gardiner Rhyme has agreed to discussed the case with the patient's daughter who is a cardiac nurse.  With the language barrier this is very difficult discussion having he is extremely scared.  At this point I think his only real option is bypass surgery.  IV heparin was started per pharmacy protocol for IABP. He was hemodynamically stable, chest pain-free and breathing well leave the Cath Lab. Glenetta Hew, MD      Medications:     Current Medications: .  [START  ON 01/31/2021] aspirin EC  81 mg Oral Daily  . atorvastatin  80 mg Oral Daily  . Chlorhexidine Gluconate Cloth  6 each Topical Daily  . insulin aspart  0-9 Units Subcutaneous Q4H  . isosorbide-hydrALAZINE  1 tablet Oral TID  . sodium chloride flush  3 mL Intravenous Q12H  . sodium chloride flush  3 mL Intravenous Q12H  . sodium chloride flush  3 mL Intravenous Q12H     Infusions: . sodium chloride 20 mL/hr at 01/30/21 1600  . sodium chloride 75 mL/hr (01/30/21 1601)  . sodium chloride    . heparin 700 Units/hr (01/30/21 1600)  . sodium chloride irrigation        Assessment/Plan   1. CAD/ NSTEMI - Hs trop 16,000 - LHC w/ severe MVD w/ LM involvement (see complete angiographic details above) - Awaiting surgical consultation for potential CABG  - continue IV heparin, ASA + high intensity statin   - off  blocker due to bradycardia  - c/w IABP to help w/ coronary perfusion   2. Acute Systolic Heart Failure/ Ischemic CM - EF moderately reduced 35-40%, RV normal  - RHC today w/ mildly elevated LVEDP. Normal cardiac output and index by Fick. PCWP/LVEDP of 18-20. CI 3.29  - IABP placed to help w/ coronary perfusion - will continue to optimize HF for potential CABG - will need continued diuresis, closely monitor renal function. SCr ~2.2 but appears this may be his baseline  - No ARB/ ARNi or spiro given abnormal renal function  - continue Bidil for afterload reduction   3. DM - Hgb A1c 8.1  - SSI  - possible SGLT2i pending GFR trend   4. HLD w/ LDL Goal < 70  - LDL 129 - Atorva 80 mg   5. Hypertension  - Continue Bidil  - bradycardia limits  blocker - no ARB/spiro w/ CKD - may need amlodipine   6. CKD - SCr trend 1.75>>2.27>>2.26 - Unclear baseline, but notably creatinine was 2.6 on previous labs from 10 years ago.  Suspect likely CKD - plan SGLT2i if GFR remains > 25 given concomitant CV disease and DM   Length of Stay: 1  Brittainy Simmons, PA-C   01/30/2021, 4:19 PM  Advanced Heart Failure Team Pager [phone] (M-F; 7a - 4p)  Please contact [facility] for night-coverage after hours (4p -7a ) and weekends on [email or web address]  Agree with above.   80 y/o [place] male with longstanding DM2. CKD IV, HTN admitted with NSTEMI   Cath films reviewed and shows acutely occluded distal RCA, LM 50-60 distal with extension into LCX and 80% LAD lesion. EF 35-40% on echo. Hemodynamics ok, IABP placed in cath lab to stabilize coronary perfusion and await TCTS consultation,.   Currently pain free. SBP stable.  CBI catheter in place due to hematuria and urinary retention.   General:  Elderly thin male. No resp difficulty HEENT: normal Neck: supple. no JVD. Carotids 2+ bilat; no bruits. No lymphadenopathy or thryomegaly appreciated. Cor: PMI nondisplaced. Regular rate & rhythm. No rubs, gallops or murmurs. Lungs: clear Abdomen: soft, nontender, nondistended. No hepatosplenomegaly. No bruits or masses. Good bowel sounds. Extremities: no cyanosis, clubbing, rash, edema RFA IABP  Neuro: alert & orientedx3, cranial nerves grossly intact. moves all 4 extremities w/o difficulty. Affect pleasant  He clearly has surgical CAD and benefit from CABG but I am concerned about his degree of renal disease and risk of progressing to ESRD with CABG. Will continue to support and see where  his creatinine settles out though renal u/s shows significant medicorenal disease and creatinine has actually been worse (2.62) in the one value we have from 2012.   We will follow and d/w TCTS in am. D/w his daughter by phone.  CRITICAL CARE Performed by: Glori Bickers  Total critical care time: 40 minutes  Critical care time was exclusive of separately billable procedures and treating other patients.  Critical care was necessary to treat or prevent imminent or life-threatening deterioration.  Critical care was time spent personally by me (independent of midlevel providers  or residents) on the following activities: development of treatment plan with patient and/or surrogate as well as nursing, discussions with consultants, evaluation of patient's response to treatment, examination of patient, obtaining history from patient or surrogate, ordering and performing treatments and interventions, ordering and review of laboratory studies, ordering and review of radiographic studies, pulse oximetry and re-evaluation of patient's condition.  Glori Bickers, MD  11:19 PM

## 2021-01-30 NOTE — Progress Notes (Signed)
[facility] for heparin Indication: chest pain/ACS  No Known Allergies  Patient Measurements: Height: 5\' 9"  (175.3 cm) Weight: 68.6 kg (151 lb 3.8 oz) IBW/kg (Calculated) : 70.7 Heparin Dosing Weight: 67 kg (based on historical records)   Vital Signs: Temp: 98.5 F (36.9 C) (02/09 1221) Temp Source: Oral (02/09 1221) BP: 132/58 (02/09 1426) Pulse Rate: 63 (02/09 1431)  Labs: Recent Labs    01/28/21 2322 01/29/21 0043 01/29/21 0913 01/29/21 1210 01/29/21 1700 01/29/21 1945 01/29/21 2004 01/30/21 0442  HGB 12.1*  --   --   --   --   --   --  11.0*  HCT 37.9*  --   --   --   --   --   --  35.1*  PLT 501*  --   --   --   --   --   --  493*  HEPARINUNFRC  --   --   --   --   --   --  0.43 0.32  CREATININE 1.75*  --  2.27*  --   --   --   --  2.26*  TROPONINIHS 238*   < > 14,097* 16,532* 14,215* 15,900*  --   --    < > = values in this interval not displayed.    Estimated Creatinine Clearance: 24.9 mL/min (A) (by C-G formula based on SCr of 2.26 mg/dL (H)).    Assessment: 65 YOM who presents with chest pain and SOB now with rising troponins to start IV heparin. Trops are trending down at this time.   Heparin level therapeutic (0.32) on gtt at 800 units/hr. No bleeding noted.  Heparin turned off for cath this afternoon.  Pharmacy asked to resume post-procedure.  IABP in place.    Goal of Therapy:  Heparin level 0.3-0.7 units/ml Monitor platelets by anticoagulation protocol: Yes   Plan:  Resume IV heparin gtt at 700 units/hr now. Check heparin level in 8 hrs. Daily heparin level and CBC.  [place] Crane, Roylene Reason, BCCP Clinical Pharmacist  01/30/2021 3:10 PM   [facility] pharmacy phone numbers are listed on [email or web address]

## 2021-01-30 NOTE — Progress Notes (Signed)
[facility] for heparin Indication: chest pain/ACS  No Known Allergies  Patient Measurements: Height: 5\' 9"  (175.3 cm) Weight: 68.6 kg (151 lb 3.8 oz) IBW/kg (Calculated) : 70.7 Heparin Dosing Weight: 67 kg (based on historical records)   Vital Signs: Temp: 97.7 F (36.5 C) (02/09 0419) Temp Source: Oral (02/09 0419) BP: 153/57 (02/09 0429) Pulse Rate: 62 (02/09 0429)  Labs: Recent Labs    01/28/21 2322 01/29/21 0043 01/29/21 0913 01/29/21 1210 01/29/21 1700 01/29/21 1945 01/29/21 2004 01/30/21 0442  HGB 12.1*  --   --   --   --   --   --  11.0*  HCT 37.9*  --   --   --   --   --   --  35.1*  PLT 501*  --   --   --   --   --   --  493*  HEPARINUNFRC  --   --   --   --   --   --  0.43 0.32  CREATININE 1.75*  --  2.27*  --   --   --   --   --   TROPONINIHS 238*   < > 14,097* 16,532* 14,215* 15,900*  --   --    < > = values in this interval not displayed.    Estimated Creatinine Clearance: 24.8 mL/min (A) (by C-G formula based on SCr of 2.27 mg/dL (H)).    Assessment: 38 YOM who presents with chest pain and SOB now with rising troponins to start IV heparin. Trops are trending down at this time.   Heparin level therapeutic (0.32) on gtt at 800 units/hr. No bleeding noted.  Goal of Therapy:  Heparin level 0.3-0.7 units/ml Monitor platelets by anticoagulation protocol: Yes   Plan:  Continue Heparin drip @ 800 units/hr Will f/u daily heparin level and CBC   Sherlon Handing, PharmD, BCPS Please see amion for complete clinical pharmacist phone list 01/30/2021 6:23 AM

## 2021-01-30 NOTE — ED Notes (Signed)
Attempted to call pt's family, unsuccessful, VM left.

## 2021-01-30 NOTE — Progress Notes (Signed)
Urology Inpatient Progress Report  Hypertensive crisis [I16.9] Acute pulmonary edema ([place]) [J81.0] Respiratory distress [R06.03] AKI (acute kidney injury) ([place]) [N17.9] New onset of congestive heart failure ([place]) [I50.9]  Procedure(s): LEFT HEART CATH AND CORONARY ANGIOGRAPHY  Intv/Subj: Slept well, comfortable with catheter No issues with catheter occlusion - remains on CBI  Principal Problem:   New onset of congestive heart failure (HCC) Active Problems:   Renal insufficiency   HTN (hypertension)   Acute respiratory failure with hypoxia (HCC)  Current Facility-Administered Medications  Medication Dose Route Frequency Provider Last Rate Last Admin  . 0.9 %  sodium chloride infusion  250 mL Intravenous PRN Etta Quill, DO      . 0.9 %  sodium chloride infusion  250 mL Intravenous PRN Kathyrn Drown D, NP      . 0.9 %  sodium chloride infusion   Intravenous Continuous Donato Heinz, MD 20 mL/hr at 01/30/21 0945 Rate Change at 01/30/21 0945  . acetaminophen (TYLENOL) tablet 650 mg  650 mg Oral Q4H PRN Etta Quill, DO   650 mg at 01/29/21 1649  . [START ON 01/31/2021] aspirin EC tablet 81 mg  81 mg Oral Daily Shah, Pratik D, DO      . atorvastatin (LIPITOR) tablet 80 mg  80 mg Oral Daily Donato Heinz, MD   80 mg at 01/30/21 0950  . Chlorhexidine Gluconate Cloth 2 % PADS 6 each  6 each Topical Daily Heath Lark D, DO   6 each at 01/30/21 0946  . heparin ADULT infusion 100 units/mL (25000 units/253mL)  800 Units/hr Intravenous Continuous Lavenia Atlas, RPH 8 mL/hr at 01/30/21 0708 800 Units/hr at 01/30/21 0708  . insulin aspart (novoLOG) injection 0-9 Units  0-9 Units Subcutaneous Q4H Shah, Pratik D, DO   1 Units at 01/30/21 0131  . isosorbide-hydrALAZINE (BIDIL) 20-37.5 MG per tablet 1 tablet  1 tablet Oral TID Donato Heinz, MD   1 tablet at 01/30/21 0950  . nitroGLYCERIN (NITROSTAT) SL tablet 0.4 mg  0.4 mg Sublingual Q5 Min x 3 PRN  Montine Circle, PA-C   0.4 mg at 01/28/21 2315  . nitroGLYCERIN 50 mg in dextrose 5 % 250 mL (0.2 mg/mL) infusion  0-200 mcg/min Intravenous Continuous Montine Circle, PA-C 9 mL/hr at 01/30/21 0955 30 mcg/min at 01/30/21 0955  . ondansetron (ZOFRAN) injection 4 mg  4 mg Intravenous Q6H PRN Etta Quill, DO      . sodium chloride flush (NS) 0.9 % injection 3 mL  3 mL Intravenous Q12H Jennette Kettle M, DO   3 mL at 01/30/21 4098  . sodium chloride flush (NS) 0.9 % injection 3 mL  3 mL Intravenous PRN Etta Quill, DO      . sodium chloride flush (NS) 0.9 % injection 3 mL  3 mL Intravenous Q12H Kathyrn Drown D, NP   3 mL at 01/30/21 0951  . sodium chloride flush (NS) 0.9 % injection 3 mL  3 mL Intravenous PRN Kathyrn Drown D, NP      . sodium chloride irrigation 0.9 % 3,000 mL  3,000 mL Irrigation Continuous Ardis Hughs, MD   3,000 mL at 01/30/21 0956     Objective: Vital: Vitals:   01/30/21 0319 01/30/21 0419 01/30/21 0429 01/30/21 0722  BP: (!) 145/71  (!) 153/57 (!) 139/52  Pulse: 69  62 (!) 55  Resp: 19  16 14   Temp:  97.7 F (36.5 C)  97.8 F (36.6 C)  TempSrc:  Oral  Oral  SpO2: 98%  97% 100%  Weight:      Height:       I/Os: I/O last 3 completed shifts: In: 3948.4 [I.V.:948.4; Other:3000] Out: [facility] [[id number]]  Physical Exam:  General: Patient is in no apparent distress Lungs: Normal respiratory effort, chest expands symmetrically. GI: he abdomen is soft and nontender without mass. Ext: lower extremities symmetric  Lab Results: Recent Labs    01/28/21 2322 01/30/21 0442  WBC 15.7* 17.8*  HGB 12.1* 11.0*  HCT 37.9* 35.1*   Recent Labs    01/28/21 2322 01/29/21 0913 01/30/21 0442  NA 137 135 138  K 3.7 4.9 4.3  CL 103 100 104  CO2 20* 14* 21*  GLUCOSE 204* 314* 120*  BUN 30* 33* 48*  CREATININE 1.75* 2.27* 2.26*  CALCIUM 8.3* 8.6* 9.2   No results for input(s): LABPT, INR in the last 72 hours. No results for input(s): LABURIN in  the last 72 hours. Results for orders placed or performed during the hospital encounter of 01/28/21  SARS Coronavirus 2 by RT PCR (hospital order, performed in [facility] hospital lab) Nasopharyngeal Nasopharyngeal Swab     Status: None   Collection Time: 01/30/21  4:41 AM   Specimen: Nasopharyngeal Swab  Result Value Ref Range Status   SARS Coronavirus 2 NEGATIVE NEGATIVE Final    Comment: (NOTE) SARS-CoV-2 target nucleic acids are NOT DETECTED.  The SARS-CoV-2 RNA is generally detectable in upper and lower respiratory specimens during the acute phase of infection. The lowest concentration of SARS-CoV-2 viral copies this assay can detect is 250 copies / mL. A negative result does not preclude SARS-CoV-2 infection and should not be used as the sole basis for treatment or other patient management decisions.  A negative result may occur with improper specimen collection / handling, submission of specimen other than nasopharyngeal swab, presence of viral mutation(s) within the areas targeted by this assay, and inadequate number of viral copies (<250 copies / mL). A negative result must be combined with clinical observations, patient history, and epidemiological information.  Fact Sheet for Patients:   StrictlyIdeas.no  Fact Sheet for Healthcare Providers: BankingDealers.co.za  This test is not yet approved or  cleared by the [place] FDA and has been authorized for detection and/or diagnosis of SARS-CoV-2 by FDA under an Emergency Use Authorization (EUA).  This EUA will remain in effect (meaning this test can be used) for the duration of the COVID-19 declaration under Section 564(b)(1) of the Act, 21 U.S.C. section 360bbb-3(b)(1), unless the authorization is terminated or revoked sooner.  Performed at [facility], [place] [address], [place], [place] [address]     Studies/Results: US RENAL  Result Date: 01/29/2021 CLINICAL  DATA:  AKI EXAM: RENAL / URINARY TRACT ULTRASOUND COMPLETE COMPARISON:  10/15/2018 and prior. FINDINGS: Right Kidney: Renal measurements: 8.9 x 4.5 x 4.9 cm = volume: 102.7 mL. Diffuse cortical thinning. Echogenicity within normal limits. No mass or hydronephrosis visualized. Left Kidney: Renal measurements: 9.4 x 5.5 x 5.3 cm. = volume: 143.9 mL. Diffuse cortical thinning. Echogenicity within normal limits. No mass or hydronephrosis visualized. Bladder: Appears normal for degree of bladder distention. Other: Enlarged prostate measuring 6.5 x 3.9 x 5.0 cm. IMPRESSION: Bilateral cortical atrophy. Otherwise unremarkable sonographic appearance of the kidneys. Prostatomegaly. Electronically Signed   By: Primitivo Gauze M.D.   On: 01/29/2021 12:55   DG Chest Port 1 View  Result Date: 01/28/2021 CLINICAL DATA:  Shortness of breath  EXAM: PORTABLE CHEST 1 VIEW COMPARISON:  None. FINDINGS: Heart is borderline in size. Vascular congestion. Mild interstitial prominence could reflect interstitial edema. Right basilar opacity, likely atelectasis with small right effusion. IMPRESSION: Borderline heart size with vascular congestion and possible early interstitial edema. Small right effusion with right base atelectasis. Electronically Signed   By: Rolm Baptise M.D.   On: 01/28/2021 22:55   ECHOCARDIOGRAM COMPLETE  Result Date: 01/29/2021    ECHOCARDIOGRAM REPORT   Patient Name:   Randy [name] Date of Exam: 01/29/2021 Medical Rec #:  [id number]          Height: Accession #:    [id number]         Weight: Date of Birth:  1941/09/20           BSA: Patient Age:    42 years           BP:           181/76 mmHg Patient Gender: M                  HR:           73 bpm. Exam Location:  Inpatient Procedure: 2D Echo, Cardiac Doppler, Color Doppler and Strain Analysis Indications:    CHF-Acute Systolic S56.81  History:        Patient has no prior history of Echocardiogram examinations.                 Signs/Symptoms:Chest Pain; Risk  Factors:Hypertension, Diabetes                 and Dyslipidemia. Elevated Troponin.  Sonographer:    Darlina Sicilian RDCS Referring Phys: [place]  1. Akinesis of the inferolateral wall; hypokinesis of the inferior wall; overall moderate LV dysfunction.  2. Left ventricular ejection fraction, by estimation, is 35 to 40%. The left ventricle has moderately decreased function. The left ventricle demonstrates regional wall motion abnormalities (see scoring diagram/findings for description). There is moderate left ventricular hypertrophy. Left ventricular diastolic parameters are consistent with Grade I diastolic dysfunction (impaired relaxation).  3. Right ventricular systolic function is normal. The right ventricular size is normal.  4. The mitral valve is normal in structure. Trivial mitral valve regurgitation. No evidence of mitral stenosis.  5. The aortic valve is tricuspid. Aortic valve regurgitation is mild. Mild aortic valve sclerosis is present, with no evidence of aortic valve stenosis. FINDINGS  Left Ventricle: Left ventricular ejection fraction, by estimation, is 35 to 40%. The left ventricle has moderately decreased function. The left ventricle demonstrates regional wall motion abnormalities. The left ventricular internal cavity size was normal in size. There is moderate left ventricular hypertrophy. Left ventricular diastolic parameters are consistent with Grade I diastolic dysfunction (impaired relaxation). Right Ventricle: The right ventricular size is normal. Right ventricular systolic function is normal. Left Atrium: Left atrial size was normal in size. Right Atrium: Right atrial size was normal in size. Pericardium: There is no evidence of pericardial effusion. Mitral Valve: The mitral valve is normal in structure. Trivial mitral valve regurgitation. No evidence of mitral valve stenosis. Tricuspid Valve: The tricuspid valve is normal in structure. Tricuspid valve regurgitation is  trivial. No evidence of tricuspid stenosis. Aortic Valve: The aortic valve is tricuspid. Aortic valve regurgitation is mild. Aortic regurgitation PHT measures 419 msec. Mild aortic valve sclerosis is present, with no evidence of aortic valve stenosis. Pulmonic Valve: The pulmonic valve was not well visualized. Pulmonic valve regurgitation is trivial.  No evidence of pulmonic stenosis. Aorta: The aortic root is normal in size and structure. Venous: The inferior vena cava was not well visualized.  Additional Comments: Akinesis of the inferolateral wall; hypokinesis of the inferior wall; overall moderate LV dysfunction.  LEFT VENTRICLE PLAX 2D LVIDd:         4.70 cm LVIDs:         3.70 cm LV PW:         1.30 cm LV IVS:        1.50 cm LVOT diam:     2.00 cm LVOT Area:     3.14 cm  LV Volumes (MOD) LV vol d, MOD A2C: 107.0 ml LV vol d, MOD A4C: 167.0 ml LV vol s, MOD A2C: 61.2 ml LV vol s, MOD A4C: 93.9 ml LV SV MOD A2C:     45.8 ml LV SV MOD A4C:     167.0 ml LV SV MOD BP:      62.5 ml RIGHT VENTRICLE RV S prime:     18.30 cm/s TAPSE (M-mode): 1.2 cm LEFT ATRIUM LA diam:      4.50 cm LA Vol (A2C): 46.9 ml LA Vol (A4C): 63.6 ml  AORTIC VALVE AI PHT:      419 msec  AORTA Ao Root diam: 3.20 cm Ao Asc diam:  3.30 cm  SHUNTS Systemic Diam: 2.00 cm Kirk Ruths MD Electronically signed by Kirk Ruths MD Signature Date/Time: 01/29/2021/12:59:05 PM    Final     Assessment: Procedure(s): LEFT HEART CATH AND CORONARY ANGIOGRAPHY Traumatic foley placement resulting in hematuria - on CBI urine remains lightly bloody (pink) on slow CBI gtt  Plan: Will likely take some time for urine to clear since he is anticoagulated.  Wean CBI drip rate to off as able - titrate the color of the urine to pink.   He should be started on Flomax if ok with Cardiology. Ok to perform voiding trial prior to discharge if bleeding is stopped. Really didn't have high volume retention.   Louis Meckel, MD Urology 01/30/2021, 10:44 AM

## 2021-01-30 NOTE — Progress Notes (Signed)
Orthopedic Tech Progress Note Patient Details:  Randy [name] 1941-03-04 [id number] RN called requesting a KNEE IMMOBILIZER for patient. Ortho Devices Type of Ortho Device: Knee Immobilizer Ortho Device/Splint Location: RLE Ortho Device/Splint Interventions: Application,Adjustment   Post Interventions Patient Tolerated: Well Instructions Provided: Care of [place] 01/30/2021, 3:46 PM

## 2021-01-30 NOTE — H&P (View-Only) (Signed)
Progress Note  Patient Name: Randy [name] Date of Encounter: 01/30/2021  [facility] Cardiologist: No primary care provider on file.   Subjective   Denies any chest pain or shortness of breath.  Inpatient Medications    Scheduled Meds: . [START ON 01/31/2021] aspirin EC  81 mg Oral Daily  . atorvastatin  80 mg Oral Daily  . Chlorhexidine Gluconate Cloth  6 each Topical Daily  . insulin aspart  0-9 Units Subcutaneous Q4H  . isosorbide-hydrALAZINE  1 tablet Oral TID  . sodium chloride flush  3 mL Intravenous Q12H  . sodium chloride flush  3 mL Intravenous Q12H   Continuous Infusions: . sodium chloride    . sodium chloride    . sodium chloride 50 mL/hr at 01/29/21 2339  . heparin 800 Units/hr (01/30/21 0708)  . nitroGLYCERIN 30 mcg/min (01/30/21 6503)  . sodium chloride irrigation     PRN Meds: sodium chloride, sodium chloride, acetaminophen, nitroGLYCERIN, ondansetron (ZOFRAN) IV, sodium chloride flush, sodium chloride flush   Vital Signs    Vitals:   01/30/21 0319 01/30/21 0419 01/30/21 0429 01/30/21 0722  BP: (!) 145/71  (!) 153/57 (!) 139/52  Pulse: 69  62 (!) 55  Resp: 19  16 14   Temp:  97.7 F (36.5 C)  97.8 F (36.6 C)  TempSrc:  Oral  Oral  SpO2: 98%  97% 100%  Weight:      Height:        Intake/Output Summary (Last 24 hours) at 01/30/2021 0842 Last data filed at 01/30/2021 0831 Gross per 24 hour  Intake 3948.38 ml  Output 12275 ml  Net -8326.62 ml   Last 3 Weights 01/30/2021  Weight (lbs) 151 lb 3.8 oz  Weight (kg) 68.6 kg      Telemetry    Normal sinus rhythm with rate 50s to 70s- Personally Reviewed  ECG    No new ECG- Personally Reviewed  Physical Exam   GEN: No acute distress.   Neck: No JVD Cardiac: RRR, no murmurs Respiratory:  Bibasilar crackles GI: Soft, nontender, non-distended  MS:  Trace edema Neuro:  Nonfocal  Psych: Normal affect   Labs    High Sensitivity Troponin:   Recent Labs  Lab 01/29/21 0725  01/29/21 0913 01/29/21 1210 01/29/21 1700 01/29/21 1945  TROPONINIHS 16,150* 14,097* 16,532* 14,215* 15,900*      Chemistry Recent Labs  Lab 01/28/21 2322 01/29/21 0913 01/30/21 0442  NA 137 135 138  K 3.7 4.9 4.3  CL 103 100 104  CO2 20* 14* 21*  GLUCOSE 204* 314* 120*  BUN 30* 33* 48*  CREATININE 1.75* 2.27* 2.26*  CALCIUM 8.3* 8.6* 9.2  PROT  --   --  6.5  ALBUMIN  --   --  3.6  AST  --   --  57*  ALT  --   --  31  ALKPHOS  --   --  49  BILITOT  --   --  0.6  GFRNONAA 39* 28* 28*  ANIONGAP 14 21* 13     Hematology Recent Labs  Lab 01/28/21 2322 01/30/21 0442  WBC 15.7* 17.8*  RBC 4.50 4.22  HGB 12.1* 11.0*  HCT 37.9* 35.1*  MCV 84.2 83.2  MCH 26.9 26.1  MCHC 31.9 31.3  RDW 13.2 13.3  PLT 501* 493*    BNP Recent Labs  Lab 01/28/21 2322  BNP 643.2*     DDimer No results for input(s): DDIMER in the last 168 hours.   Radiology  US RENAL  Result Date: 01/29/2021 CLINICAL DATA:  AKI EXAM: RENAL / URINARY TRACT ULTRASOUND COMPLETE COMPARISON:  10/15/2018 and prior. FINDINGS: Right Kidney: Renal measurements: 8.9 x 4.5 x 4.9 cm = volume: 102.7 mL. Diffuse cortical thinning. Echogenicity within normal limits. No mass or hydronephrosis visualized. Left Kidney: Renal measurements: 9.4 x 5.5 x 5.3 cm. = volume: 143.9 mL. Diffuse cortical thinning. Echogenicity within normal limits. No mass or hydronephrosis visualized. Bladder: Appears normal for degree of bladder distention. Other: Enlarged prostate measuring 6.5 x 3.9 x 5.0 cm. IMPRESSION: Bilateral cortical atrophy. Otherwise unremarkable sonographic appearance of the kidneys. Prostatomegaly. Electronically Signed   By: Primitivo Gauze M.D.   On: 01/29/2021 12:55   DG Chest Port 1 View  Result Date: 01/28/2021 CLINICAL DATA:  Shortness of breath EXAM: PORTABLE CHEST 1 VIEW COMPARISON:  None. FINDINGS: Heart is borderline in size. Vascular congestion. Mild interstitial prominence could reflect  interstitial edema. Right basilar opacity, likely atelectasis with small right effusion. IMPRESSION: Borderline heart size with vascular congestion and possible early interstitial edema. Small right effusion with right base atelectasis. Electronically Signed   By: Rolm Baptise M.D.   On: 01/28/2021 22:55   ECHOCARDIOGRAM COMPLETE  Result Date: 01/29/2021    ECHOCARDIOGRAM REPORT   Patient Name:   Randy [name] Date of Exam: 01/29/2021 Medical Rec #:  [id number]          Height: Accession #:    [id number]         Weight: Date of Birth:  08-30-1941           BSA: Patient Age:    31 years           BP:           181/76 mmHg Patient Gender: M                  HR:           73 bpm. Exam Location:  Inpatient Procedure: 2D Echo, Cardiac Doppler, Color Doppler and Strain Analysis Indications:    CHF-Acute Systolic Q25.95  History:        Patient has no prior history of Echocardiogram examinations.                 Signs/Symptoms:Chest Pain; Risk Factors:Hypertension, Diabetes                 and Dyslipidemia. Elevated Troponin.  Sonographer:    Darlina Sicilian RDCS Referring Phys: [place]  1. Akinesis of the inferolateral wall; hypokinesis of the inferior wall; overall moderate LV dysfunction.  2. Left ventricular ejection fraction, by estimation, is 35 to 40%. The left ventricle has moderately decreased function. The left ventricle demonstrates regional wall motion abnormalities (see scoring diagram/findings for description). There is moderate left ventricular hypertrophy. Left ventricular diastolic parameters are consistent with Grade I diastolic dysfunction (impaired relaxation).  3. Right ventricular systolic function is normal. The right ventricular size is normal.  4. The mitral valve is normal in structure. Trivial mitral valve regurgitation. No evidence of mitral stenosis.  5. The aortic valve is tricuspid. Aortic valve regurgitation is mild. Mild aortic valve sclerosis is present, with no  evidence of aortic valve stenosis. FINDINGS  Left Ventricle: Left ventricular ejection fraction, by estimation, is 35 to 40%. The left ventricle has moderately decreased function. The left ventricle demonstrates regional wall motion abnormalities. The left ventricular internal cavity size was normal in size. There is moderate left ventricular hypertrophy.  Left ventricular diastolic parameters are consistent with Grade I diastolic dysfunction (impaired relaxation). Right Ventricle: The right ventricular size is normal. Right ventricular systolic function is normal. Left Atrium: Left atrial size was normal in size. Right Atrium: Right atrial size was normal in size. Pericardium: There is no evidence of pericardial effusion. Mitral Valve: The mitral valve is normal in structure. Trivial mitral valve regurgitation. No evidence of mitral valve stenosis. Tricuspid Valve: The tricuspid valve is normal in structure. Tricuspid valve regurgitation is trivial. No evidence of tricuspid stenosis. Aortic Valve: The aortic valve is tricuspid. Aortic valve regurgitation is mild. Aortic regurgitation PHT measures 419 msec. Mild aortic valve sclerosis is present, with no evidence of aortic valve stenosis. Pulmonic Valve: The pulmonic valve was not well visualized. Pulmonic valve regurgitation is trivial. No evidence of pulmonic stenosis. Aorta: The aortic root is normal in size and structure. Venous: The inferior vena cava was not well visualized.  Additional Comments: Akinesis of the inferolateral wall; hypokinesis of the inferior wall; overall moderate LV dysfunction.  LEFT VENTRICLE PLAX 2D LVIDd:         4.70 cm LVIDs:         3.70 cm LV PW:         1.30 cm LV IVS:        1.50 cm LVOT diam:     2.00 cm LVOT Area:     3.14 cm  LV Volumes (MOD) LV vol d, MOD A2C: 107.0 ml LV vol d, MOD A4C: 167.0 ml LV vol s, MOD A2C: 61.2 ml LV vol s, MOD A4C: 93.9 ml LV SV MOD A2C:     45.8 ml LV SV MOD A4C:     167.0 ml LV SV MOD BP:      62.5  ml RIGHT VENTRICLE RV S prime:     18.30 cm/s TAPSE (M-mode): 1.2 cm LEFT ATRIUM LA diam:      4.50 cm LA Vol (A2C): 46.9 ml LA Vol (A4C): 63.6 ml  AORTIC VALVE AI PHT:      419 msec  AORTA Ao Root diam: 3.20 cm Ao Asc diam:  3.30 cm  SHUNTS Systemic Diam: 2.00 cm Kirk Ruths MD Electronically signed by Kirk Ruths MD Signature Date/Time: 01/29/2021/12:59:05 PM    Final     Cardiac Studies   Echo 01/29/21: 1. Akinesis of the inferolateral wall; hypokinesis of the inferior wall;  overall moderate LV dysfunction.  2. Left ventricular ejection fraction, by estimation, is 35 to 40%. The  left ventricle has moderately decreased function. The left ventricle  demonstrates regional wall motion abnormalities (see scoring  diagram/findings for description). There is  moderate left ventricular hypertrophy. Left ventricular diastolic  parameters are consistent with Grade I diastolic dysfunction (impaired  relaxation).  3. Right ventricular systolic function is normal. The right ventricular  size is normal.  4. The mitral valve is normal in structure. Trivial mitral valve  regurgitation. No evidence of mitral stenosis.  5. The aortic valve is tricuspid. Aortic valve regurgitation is mild.  Mild aortic valve sclerosis is present, with no evidence of aortic valve  stenosis.   Patient Profile     80 y.o. male with hypertension, T2DM who presents with NSTEMI.  Assessment & Plan    NSTEMI: Presented with acute chest pain, troponin peaked at 16,532.  Echocardiogram showed akinesis of inferolateral wall with hypokinesis of inferior wall, EF 35 to 40%, normal RV function, mild AI.  Currently chest pain-free.   -Continue aspirin 81  mg daily -Continue atorvastatin 80 mg daily- -Currently on nitroglycerin drip at 30 mcg/min.  He has remained chest pain-free.  Started on BiDil, continue to wean nitro drip -Had initially planned for cardiac catheterization yesterday, but labs showed worsening renal  function (creatinine 1.8 > 2.3).  Unclear baseline creatinine, only prior labs are from 10 years ago and showed creatinine 2.6.  Suspect he is likely near his baseline.  Given gentle IV fluids overnight, renal function stable this morning at creatinine 2.3.  Will discuss catheterization with interventional cardiology.  Acute combined systolic and diastolic heart failure: Secondary to NSTEMI as above.  LVEF 35 to 40% with inferolateral/inferior wall motion abnormalities -Planning for RHC/LHC as above -Initially diuresed with IV Lasix on admission, developed worsening renal function.  Holding Lasix for now, will follow up results of catheterization -Hold off on ACE/ARB/Arni given kidney function -Started on BiDil 1 tab 3 times daily, weaning nitroglycerin drip -Currently bradycardic to 50s, will hold off on beta-blocker for now  Hematuria: Noted after Foley catheter attempt yesterday, Foley was placed by urology and started on CBI  AKI vs CKD: Creatinine stable at 2.3 this morning.  Unclear baseline, but notably creatinine was 2.6 on previous labs from 10 years ago.  Suspect likely CKD.  Hyperlipidemia: LDL 129, started on atorvastatin 80 mg daily  T2DM: A1c 8.1%.  On SSI      For questions or updates, please contact [place] Please consult [email or web address] for contact info under        Signed, Donato Heinz, MD  01/30/2021, 8:42 AM

## 2021-01-30 NOTE — Progress Notes (Signed)
Progress Note  Patient Name: Randy [name] Date of Encounter: 01/30/2021  [facility] Cardiologist: No primary care provider on file.   Subjective   Denies any chest pain or shortness of breath.  Inpatient Medications    Scheduled Meds: . [START ON 01/31/2021] aspirin EC  81 mg Oral Daily  . atorvastatin  80 mg Oral Daily  . Chlorhexidine Gluconate Cloth  6 each Topical Daily  . insulin aspart  0-9 Units Subcutaneous Q4H  . isosorbide-hydrALAZINE  1 tablet Oral TID  . sodium chloride flush  3 mL Intravenous Q12H  . sodium chloride flush  3 mL Intravenous Q12H   Continuous Infusions: . sodium chloride    . sodium chloride    . sodium chloride 50 mL/hr at 01/29/21 2339  . heparin 800 Units/hr (01/30/21 0708)  . nitroGLYCERIN 30 mcg/min (01/30/21 5885)  . sodium chloride irrigation     PRN Meds: sodium chloride, sodium chloride, acetaminophen, nitroGLYCERIN, ondansetron (ZOFRAN) IV, sodium chloride flush, sodium chloride flush   Vital Signs    Vitals:   01/30/21 0319 01/30/21 0419 01/30/21 0429 01/30/21 0722  BP: (!) 145/71  (!) 153/57 (!) 139/52  Pulse: 69  62 (!) 55  Resp: 19  16 14   Temp:  97.7 F (36.5 C)  97.8 F (36.6 C)  TempSrc:  Oral  Oral  SpO2: 98%  97% 100%  Weight:      Height:        Intake/Output Summary (Last 24 hours) at 01/30/2021 0842 Last data filed at 01/30/2021 0831 Gross per 24 hour  Intake 3948.38 ml  Output 12275 ml  Net -8326.62 ml   Last 3 Weights 01/30/2021  Weight (lbs) 151 lb 3.8 oz  Weight (kg) 68.6 kg      Telemetry    Normal sinus rhythm with rate 50s to 70s- Personally Reviewed  ECG    No new ECG- Personally Reviewed  Physical Exam   GEN: No acute distress.   Neck: No JVD Cardiac: RRR, no murmurs Respiratory:  Bibasilar crackles GI: Soft, nontender, non-distended  MS:  Trace edema Neuro:  Nonfocal  Psych: Normal affect   Labs    High Sensitivity Troponin:   Recent Labs  Lab 01/29/21 0725  01/29/21 0913 01/29/21 1210 01/29/21 1700 01/29/21 1945  TROPONINIHS 16,150* 14,097* 16,532* 14,215* 15,900*      Chemistry Recent Labs  Lab 01/28/21 2322 01/29/21 0913 01/30/21 0442  NA 137 135 138  K 3.7 4.9 4.3  CL 103 100 104  CO2 20* 14* 21*  GLUCOSE 204* 314* 120*  BUN 30* 33* 48*  CREATININE 1.75* 2.27* 2.26*  CALCIUM 8.3* 8.6* 9.2  PROT  --   --  6.5  ALBUMIN  --   --  3.6  AST  --   --  57*  ALT  --   --  31  ALKPHOS  --   --  49  BILITOT  --   --  0.6  GFRNONAA 39* 28* 28*  ANIONGAP 14 21* 13     Hematology Recent Labs  Lab 01/28/21 2322 01/30/21 0442  WBC 15.7* 17.8*  RBC 4.50 4.22  HGB 12.1* 11.0*  HCT 37.9* 35.1*  MCV 84.2 83.2  MCH 26.9 26.1  MCHC 31.9 31.3  RDW 13.2 13.3  PLT 501* 493*    BNP Recent Labs  Lab 01/28/21 2322  BNP 643.2*     DDimer No results for input(s): DDIMER in the last 168 hours.   Radiology  US RENAL  Result Date: 01/29/2021 CLINICAL DATA:  AKI EXAM: RENAL / URINARY TRACT ULTRASOUND COMPLETE COMPARISON:  10/15/2018 and prior. FINDINGS: Right Kidney: Renal measurements: 8.9 x 4.5 x 4.9 cm = volume: 102.7 mL. Diffuse cortical thinning. Echogenicity within normal limits. No mass or hydronephrosis visualized. Left Kidney: Renal measurements: 9.4 x 5.5 x 5.3 cm. = volume: 143.9 mL. Diffuse cortical thinning. Echogenicity within normal limits. No mass or hydronephrosis visualized. Bladder: Appears normal for degree of bladder distention. Other: Enlarged prostate measuring 6.5 x 3.9 x 5.0 cm. IMPRESSION: Bilateral cortical atrophy. Otherwise unremarkable sonographic appearance of the kidneys. Prostatomegaly. Electronically Signed   By: Primitivo Gauze M.D.   On: 01/29/2021 12:55   DG Chest Port 1 View  Result Date: 01/28/2021 CLINICAL DATA:  Shortness of breath EXAM: PORTABLE CHEST 1 VIEW COMPARISON:  None. FINDINGS: Heart is borderline in size. Vascular congestion. Mild interstitial prominence could reflect  interstitial edema. Right basilar opacity, likely atelectasis with small right effusion. IMPRESSION: Borderline heart size with vascular congestion and possible early interstitial edema. Small right effusion with right base atelectasis. Electronically Signed   By: Rolm Baptise M.D.   On: 01/28/2021 22:55   ECHOCARDIOGRAM COMPLETE  Result Date: 01/29/2021    ECHOCARDIOGRAM REPORT   Patient Name:   Randy [name] Date of Exam: 01/29/2021 Medical Rec #:  [id number]          Height: Accession #:    [id number]         Weight: Date of Birth:  [birth year]           BSA: Patient Age:    80 years           BP:           181/76 mmHg Patient Gender: M                  HR:           73 bpm. Exam Location:  Inpatient Procedure: 2D Echo, Cardiac Doppler, Color Doppler and Strain Analysis Indications:    CHF-Acute Systolic F79.02  History:        Patient has no prior history of Echocardiogram examinations.                 Signs/Symptoms:Chest Pain; Risk Factors:Hypertension, Diabetes                 and Dyslipidemia. Elevated Troponin.  Sonographer:    Darlina Sicilian RDCS Referring Phys: [place]  1. Akinesis of the inferolateral wall; hypokinesis of the inferior wall; overall moderate LV dysfunction.  2. Left ventricular ejection fraction, by estimation, is 35 to 40%. The left ventricle has moderately decreased function. The left ventricle demonstrates regional wall motion abnormalities (see scoring diagram/findings for description). There is moderate left ventricular hypertrophy. Left ventricular diastolic parameters are consistent with Grade I diastolic dysfunction (impaired relaxation).  3. Right ventricular systolic function is normal. The right ventricular size is normal.  4. The mitral valve is normal in structure. Trivial mitral valve regurgitation. No evidence of mitral stenosis.  5. The aortic valve is tricuspid. Aortic valve regurgitation is mild. Mild aortic valve sclerosis is present, with no  evidence of aortic valve stenosis. FINDINGS  Left Ventricle: Left ventricular ejection fraction, by estimation, is 35 to 40%. The left ventricle has moderately decreased function. The left ventricle demonstrates regional wall motion abnormalities. The left ventricular internal cavity size was normal in size. There is moderate left ventricular hypertrophy.  Left ventricular diastolic parameters are consistent with Grade I diastolic dysfunction (impaired relaxation). Right Ventricle: The right ventricular size is normal. Right ventricular systolic function is normal. Left Atrium: Left atrial size was normal in size. Right Atrium: Right atrial size was normal in size. Pericardium: There is no evidence of pericardial effusion. Mitral Valve: The mitral valve is normal in structure. Trivial mitral valve regurgitation. No evidence of mitral valve stenosis. Tricuspid Valve: The tricuspid valve is normal in structure. Tricuspid valve regurgitation is trivial. No evidence of tricuspid stenosis. Aortic Valve: The aortic valve is tricuspid. Aortic valve regurgitation is mild. Aortic regurgitation PHT measures 419 msec. Mild aortic valve sclerosis is present, with no evidence of aortic valve stenosis. Pulmonic Valve: The pulmonic valve was not well visualized. Pulmonic valve regurgitation is trivial. No evidence of pulmonic stenosis. Aorta: The aortic root is normal in size and structure. Venous: The inferior vena cava was not well visualized.  Additional Comments: Akinesis of the inferolateral wall; hypokinesis of the inferior wall; overall moderate LV dysfunction.  LEFT VENTRICLE PLAX 2D LVIDd:         4.70 cm LVIDs:         3.70 cm LV PW:         1.30 cm LV IVS:        1.50 cm LVOT diam:     2.00 cm LVOT Area:     3.14 cm  LV Volumes (MOD) LV vol d, MOD A2C: 107.0 ml LV vol d, MOD A4C: 167.0 ml LV vol s, MOD A2C: 61.2 ml LV vol s, MOD A4C: 93.9 ml LV SV MOD A2C:     45.8 ml LV SV MOD A4C:     167.0 ml LV SV MOD BP:      62.5  ml RIGHT VENTRICLE RV S prime:     18.30 cm/s TAPSE (M-mode): 1.2 cm LEFT ATRIUM LA diam:      4.50 cm LA Vol (A2C): 46.9 ml LA Vol (A4C): 63.6 ml  AORTIC VALVE AI PHT:      419 msec  AORTA Ao Root diam: 3.20 cm Ao Asc diam:  3.30 cm  SHUNTS Systemic Diam: 2.00 cm Kirk Ruths MD Electronically signed by Kirk Ruths MD Signature Date/Time: 01/29/2021/12:59:05 PM    Final     Cardiac Studies   Echo 01/29/21: 1. Akinesis of the inferolateral wall; hypokinesis of the inferior wall;  overall moderate LV dysfunction.  2. Left ventricular ejection fraction, by estimation, is 35 to 40%. The  left ventricle has moderately decreased function. The left ventricle  demonstrates regional wall motion abnormalities (see scoring  diagram/findings for description). There is  moderate left ventricular hypertrophy. Left ventricular diastolic  parameters are consistent with Grade I diastolic dysfunction (impaired  relaxation).  3. Right ventricular systolic function is normal. The right ventricular  size is normal.  4. The mitral valve is normal in structure. Trivial mitral valve  regurgitation. No evidence of mitral stenosis.  5. The aortic valve is tricuspid. Aortic valve regurgitation is mild.  Mild aortic valve sclerosis is present, with no evidence of aortic valve  stenosis.   Patient Profile     80 y.o. male with hypertension, T2DM who presents with NSTEMI.  Assessment & Plan    NSTEMI: Presented with acute chest pain, troponin peaked at 16,532.  Echocardiogram showed akinesis of inferolateral wall with hypokinesis of inferior wall, EF 35 to 40%, normal RV function, mild AI.  Currently chest pain-free.   -Continue aspirin 81  mg daily -Continue atorvastatin 80 mg daily- -Currently on nitroglycerin drip at 30 mcg/min.  He has remained chest pain-free.  Started on BiDil, continue to wean nitro drip -Had initially planned for cardiac catheterization yesterday, but labs showed worsening renal  function (creatinine 1.8 > 2.3).  Unclear baseline creatinine, only prior labs are from 10 years ago and showed creatinine 2.6.  Suspect he is likely near his baseline.  Given gentle IV fluids overnight, renal function stable this morning at creatinine 2.3.  Will discuss catheterization with interventional cardiology.  Acute combined systolic and diastolic heart failure: Secondary to NSTEMI as above.  LVEF 35 to 40% with inferolateral/inferior wall motion abnormalities -Planning for RHC/LHC as above -Initially diuresed with IV Lasix on admission, developed worsening renal function.  Holding Lasix for now, will follow up results of catheterization -Hold off on ACE/ARB/Arni given kidney function -Started on BiDil 1 tab 3 times daily, weaning nitroglycerin drip -Currently bradycardic to 50s, will hold off on beta-blocker for now  Hematuria: Noted after Foley catheter attempt yesterday, Foley was placed by urology and started on CBI  AKI vs CKD: Creatinine stable at 2.3 this morning.  Unclear baseline, but notably creatinine was 2.6 on previous labs from 10 years ago.  Suspect likely CKD.  Hyperlipidemia: LDL 129, started on atorvastatin 80 mg daily  T2DM: A1c 8.1%.  On SSI      For questions or updates, please contact [place] Please consult [email or web address] for contact info under        Signed, Donato Heinz, MD  01/30/2021, 8:42 AM

## 2021-01-30 NOTE — Progress Notes (Signed)
Language line called, no La[place]ian interpreters available currently to complete patient's admission navigator. Will call back later.

## 2021-01-30 NOTE — Progress Notes (Signed)
Report called to [facility] on 2 heart.

## 2021-01-30 NOTE — Interval H&P Note (Signed)
History and Physical Interval Note:  01/30/2021 1:05 PM  Randy [name]  has presented today for surgery, with the diagnosis of nstemi, cardiomyopathy with CHF.  The various methods of treatment have been discussed with the patient and family. After consideration of risks, benefits and other options for treatment, the patient has consented to  Procedure(s): RIGHT/LEFT HEART CATH AND CORONARY ANGIOGRAPHY (N/A) as a surgical intervention.  The patient's history has been reviewed, patient examined, no change in status, stable for surgery.  I have reviewed the patient's chart and labs.  Questions were answered to the patient's satisfaction.     Glenetta Hew

## 2021-01-30 NOTE — Plan of Care (Signed)

## 2021-01-31 ENCOUNTER — Encounter (HOSPITAL_COMMUNITY): Payer: Self-pay | Admitting: Cardiology

## 2021-01-31 ENCOUNTER — Inpatient Hospital Stay (HOSPITAL_COMMUNITY): Payer: Medicare Other

## 2021-01-31 DIAGNOSIS — I10 Essential (primary) hypertension: Secondary | ICD-10-CM | POA: Diagnosis not present

## 2021-01-31 DIAGNOSIS — I255 Ischemic cardiomyopathy: Secondary | ICD-10-CM

## 2021-01-31 DIAGNOSIS — N189 Chronic kidney disease, unspecified: Secondary | ICD-10-CM | POA: Diagnosis not present

## 2021-01-31 DIAGNOSIS — I5021 Acute systolic (congestive) heart failure: Secondary | ICD-10-CM | POA: Diagnosis not present

## 2021-01-31 DIAGNOSIS — I214 Non-ST elevation (NSTEMI) myocardial infarction: Secondary | ICD-10-CM

## 2021-01-31 DIAGNOSIS — I25110 Atherosclerotic heart disease of native coronary artery with unstable angina pectoris: Secondary | ICD-10-CM

## 2021-01-31 DIAGNOSIS — I5041 Acute combined systolic (congestive) and diastolic (congestive) heart failure: Secondary | ICD-10-CM

## 2021-01-31 LAB — BASIC METABOLIC PANEL
Anion gap: 14 (ref 5–15)
BUN: 44 mg/dL — ABNORMAL HIGH (ref 8–23)
CO2: 19 mmol/L — ABNORMAL LOW (ref 22–32)
Calcium: 8.7 mg/dL — ABNORMAL LOW (ref 8.9–10.3)
Chloride: 104 mmol/L (ref 98–111)
Creatinine, Ser: 2.34 mg/dL — ABNORMAL HIGH (ref 0.61–1.24)
GFR, Estimated: 27 mL/min — ABNORMAL LOW (ref >60)
Glucose, Bld: 131 mg/dL — ABNORMAL HIGH (ref 70–99)
Potassium: 4.1 mmol/L (ref 3.5–5.1)
Sodium: 137 mmol/L (ref 135–145)

## 2021-01-31 LAB — SURGICAL PCR SCREEN
MRSA, PCR: NEGATIVE
Staphylococcus aureus: NEGATIVE

## 2021-01-31 LAB — CBC
HCT: 34.1 % — ABNORMAL LOW (ref 39.0–52.0)
HCT: 32.4 % — ABNORMAL LOW (ref 39.0–52.0)
Hemoglobin: 11.4 g/dL — ABNORMAL LOW (ref 13.0–17.0)
Hemoglobin: 10.5 g/dL — ABNORMAL LOW (ref 13.0–17.0)
MCH: 27.2 pg (ref 26.0–34.0)
MCH: 26.8 pg (ref 26.0–34.0)
MCHC: 33.4 g/dL (ref 30.0–36.0)
MCHC: 32.4 g/dL (ref 30.0–36.0)
MCV: 81.4 fL (ref 80.0–100.0)
MCV: 82.7 fL (ref 80.0–100.0)
Platelets: 479 10*3/uL — ABNORMAL HIGH (ref 150–400)
Platelets: 446 10*3/uL — ABNORMAL HIGH (ref 150–400)
RBC: 4.19 MIL/uL — ABNORMAL LOW (ref 4.22–5.81)
RBC: 3.92 MIL/uL — ABNORMAL LOW (ref 4.22–5.81)
RDW: 13.3 % (ref 11.5–15.5)
RDW: 13.3 % (ref 11.5–15.5)
WBC: 14.3 10*3/uL — ABNORMAL HIGH (ref 4.0–10.5)
WBC: 13.5 10*3/uL — ABNORMAL HIGH (ref 4.0–10.5)
nRBC: 0 % (ref 0.0–0.2)
nRBC: 0 % (ref 0.0–0.2)

## 2021-01-31 LAB — GLUCOSE, CAPILLARY
Glucose-Capillary: 108 mg/dL — ABNORMAL HIGH (ref 70–99)
Glucose-Capillary: 111 mg/dL — ABNORMAL HIGH (ref 70–99)
Glucose-Capillary: 128 mg/dL — ABNORMAL HIGH (ref 70–99)
Glucose-Capillary: 132 mg/dL — ABNORMAL HIGH (ref 70–99)
Glucose-Capillary: 141 mg/dL — ABNORMAL HIGH (ref 70–99)
Glucose-Capillary: 147 mg/dL — ABNORMAL HIGH (ref 70–99)

## 2021-01-31 LAB — HEPARIN LEVEL (UNFRACTIONATED)
Heparin Unfractionated: 0.12 IU/mL — ABNORMAL LOW (ref 0.30–0.70)
Heparin Unfractionated: 0.21 IU/mL — ABNORMAL LOW (ref 0.30–0.70)

## 2021-01-31 LAB — ABO/RH: ABO/RH(D): B POS

## 2021-01-31 LAB — TYPE AND SCREEN
ABO/RH(D): B POS
Antibody Screen: NEGATIVE

## 2021-01-31 LAB — POCT ACTIVATED CLOTTING TIME: Activated Clotting Time: 178 seconds

## 2021-01-31 MED ORDER — FUROSEMIDE 10 MG/ML IJ SOLN
INTRAMUSCULAR | Status: AC
  Filled 2021-01-31: qty 4

## 2021-01-31 MED ORDER — ATORVASTATIN CALCIUM 80 MG PO TABS
80.0000 mg | ORAL_TABLET | Freq: Every day | ORAL | Status: DC
  Administered 2021-01-31 – 2021-02-06 (×7): 80 mg via ORAL
  Filled 2021-01-31 (×7): qty 1

## 2021-01-31 MED ORDER — ENOXAPARIN SODIUM 30 MG/0.3ML ~~LOC~~ SOLN
30.0000 mg | SUBCUTANEOUS | Status: DC
  Administered 2021-02-01 – 2021-02-06 (×6): 30 mg via SUBCUTANEOUS
  Filled 2021-01-31 (×6): qty 0.3

## 2021-01-31 MED ORDER — NITROGLYCERIN IN D5W 200-5 MCG/ML-% IV SOLN: 0.0000 ug/min | INTRAVENOUS | Status: DC

## 2021-01-31 MED ORDER — DEXMEDETOMIDINE HCL IN NACL 400 MCG/100ML IV SOLN
0.4000 ug/kg/h | INTRAVENOUS | Status: DC
  Administered 2021-01-31: 0.4 ug/kg/h via INTRAVENOUS

## 2021-01-31 MED ORDER — ASPIRIN 81 MG PO CHEW: 81.0000 mg | CHEWABLE_TABLET | Freq: Every day | ORAL | Status: DC

## 2021-01-31 MED ORDER — ASPIRIN 81 MG PO CHEW
81.0000 mg | CHEWABLE_TABLET | Freq: Every day | ORAL | Status: DC
  Administered 2021-01-31 – 2021-02-06 (×7): 81 mg via ORAL
  Filled 2021-01-31 (×7): qty 1

## 2021-01-31 MED ORDER — SODIUM CHLORIDE 0.9% FLUSH
3.0000 mL | Freq: Two times a day (BID) | INTRAVENOUS | Status: DC
  Administered 2021-02-01 – 2021-02-02 (×2): 3 mL via INTRAVENOUS

## 2021-01-31 MED ORDER — ATORVASTATIN CALCIUM 80 MG PO TABS: 80.0000 mg | ORAL_TABLET | Freq: Every day | ORAL | Status: DC

## 2021-01-31 MED ORDER — ISOSORB DINITRATE-HYDRALAZINE 20-37.5 MG PO TABS
1.0000 | ORAL_TABLET | Freq: Three times a day (TID) | ORAL | Status: DC
  Administered 2021-01-31 – 2021-02-06 (×19): 1 via ORAL
  Filled 2021-01-31 (×19): qty 1

## 2021-01-31 MED ORDER — LORAZEPAM 2 MG/ML IJ SOLN
1.0000 mg | INTRAMUSCULAR | Status: AC | PRN
  Administered 2021-01-31: 1 mg via INTRAVENOUS
  Filled 2021-01-31: qty 1

## 2021-01-31 MED ORDER — ATROPINE SULFATE 1 MG/10ML IJ SOSY
PREFILLED_SYRINGE | INTRAMUSCULAR | Status: AC
  Filled 2021-01-31: qty 10

## 2021-01-31 MED ORDER — ISOSORB DINITRATE-HYDRALAZINE 20-37.5 MG PO TABS: 1.0000 | ORAL_TABLET | Freq: Three times a day (TID) | ORAL | Status: DC

## 2021-01-31 MED ORDER — ACETAMINOPHEN 325 MG PO TABS: 650.0000 mg | ORAL_TABLET | ORAL | Status: DC | PRN

## 2021-01-31 MED ORDER — ACETAMINOPHEN 325 MG PO TABS
650.0000 mg | ORAL_TABLET | ORAL | Status: DC | PRN
  Administered 2021-01-31 – 2021-02-06 (×6): 650 mg via ORAL
  Filled 2021-01-31 (×6): qty 2

## 2021-01-31 NOTE — Progress Notes (Signed)
Cross-coverage note:   Patient increasingly agitated overnight. Placed on delirium precautions, had daughter try to calm him via televisit. Unfortunately, non-pharmacologic and pharmacologic interventions have been unsuccessful and he is posing danger to self. He is being started on dexmedetomidine infusion and soft-restraints for now with frequent reassessments.

## 2021-01-31 NOTE — Progress Notes (Addendum)
After IABP was pulled, a re-bleed was assessed. Control was regained at 1737.  Pressure held for 30 minutes. When pressure was released at 1807, the site was still bleeding. Pressure was resumed for an additional 20 minutes. When released at [place], the site was still bleeding. Cath lab was alerted to the rebleed and a femostop was placed on the site at 1830 with 172mmHg. MD Bensimhon was notified of the bleed.

## 2021-01-31 NOTE — Progress Notes (Signed)
Assisted tele visit to patient with daughter per  RN request, patient agitated & combative.  Sedonia Small, RN

## 2021-01-31 NOTE — Plan of Care (Signed)
  Problem: Education: Goal: Knowledge of General Education information will improve Description: Including pain rating scale, medication(s)/side effects and non-pharmacologic comfort measures Outcome: Progressing   Problem: Clinical Measurements: Goal: Ability to maintain clinical measurements within normal limits will improve Outcome: Progressing Goal: Will remain free from infection Outcome: Progressing Goal: Diagnostic test results will improve Outcome: Progressing Goal: Respiratory complications will improve Outcome: Progressing Goal: Cardiovascular complication will be avoided Outcome: Progressing   Problem: Activity: Goal: Risk for activity intolerance will decrease Outcome: Progressing   Problem: Coping: Goal: Level of anxiety will decrease Outcome: Progressing   Problem: Elimination: Goal: Will not experience complications related to bowel motility Outcome: Progressing Goal: Will not experience complications related to urinary retention Outcome: Progressing   Problem: Pain Managment: Goal: General experience of comfort will improve Outcome: Progressing   Problem: Safety: Goal: Ability to remain free from injury will improve Outcome: Progressing   Problem: Skin Integrity: Goal: Risk for impaired skin integrity will decrease Outcome: Progressing   Problem: Safety: Goal: Non-violent Restraint(s) Outcome: Progressing

## 2021-01-31 NOTE — Progress Notes (Signed)
ANTICOAGULATION CONSULT NOTE  Pharmacy Consult for heparin Indication: CAD/IABP  No Known Allergies  Patient Measurements: Height: 5\' 9"  (175.3 cm) Weight: 68.6 kg (151 lb 3.8 oz) IBW/kg (Calculated) : 70.7 Heparin Dosing Weight: 67 kg (based on historical records)   Vital Signs: Temp: 99.7 F (37.6 C) (02/10 0000) Temp Source: Oral (02/10 0000) BP: 147/64 (02/09 2100) Pulse Rate: 75 (02/10 0200)  Labs: Recent Labs    01/28/21 2322 01/29/21 0043 01/29/21 0913 01/29/21 1210 01/29/21 1700 01/29/21 1945 01/29/21 2004 01/30/21 0442 01/30/21 1335 01/30/21 1341 01/30/21 2359 01/31/21 0106  HGB 12.1*  --   --   --   --   --   --  11.0* 11.2* 11.2*  11.2*  --  11.4*  HCT 37.9*  --   --   --   --   --   --  35.1* 33.0* 33.0*  33.0*  --  34.1*  PLT 501*  --   --   --   --   --   --  493*  --   --   --  479*  HEPARINUNFRC  --   --   --   --   --   --  0.43 0.32  --   --  0.12*  --   CREATININE 1.75*  --  2.27*  --   --   --   --  2.26*  --   --   --  2.34*  TROPONINIHS 238*   < > 14,097* 16,532* 14,215* 15,900*  --   --   --   --   --   --    < > = values in this interval not displayed.    Estimated Creatinine Clearance: 24 mL/min (A) (by C-G formula based on SCr of 2.34 mg/dL (H)).    Assessment: 80 y.o. male admitted with NSTEMI, s/p cath on IABP and awaiting possible CABG, for heparin  Goal of Therapy:  Heparin level 0.3-0.7 units/ml Monitor platelets by anticoagulation protocol: Yes   Plan:  Increase Heparin 850 units/hr Check heparin level in 8 hours.   Phillis Knack, PharmD, BCPS

## 2021-01-31 NOTE — Progress Notes (Signed)
Site area: IABP removal from Right Groin    Site Prior to Removal:  Level 0   Pressure Applied For 30 minutes - Hemostasis Achieved    Manual:   Yes.     Patient Status During Pull:  Stable  BP 134/63  HR 74   Post Pull Groin Site:  Level 0   Post Pull Instructions Given:  Yes.     Post Pull Pulses Present:  Yes.     Dressing Applied:  Yes.     Comments:  No adverse reactions noted from patient, no complaints of pain.

## 2021-01-31 NOTE — Consult Note (Signed)
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 Hetty Blend is an 80 y.o. male.  HPI:   The patient is an 80 year old [place] gentleman with hypertension and type 2 diabetes admitted with acute non-ST segment elevation MI with chest pain and acute pulmonary edema.  His troponin peaked at 16,532.  An echocardiogram showed moderate LV dysfunction with ejection fraction of 35 to 40%.  Cardiac catheterization yesterday showed 40 to 50% ostial left main and 50 to 60% distal left main involving the ostium of the left circumflex which was 80% stenosed.  The mid LAD had 90% stenosis.  The right coronary artery was subtotally occluded distally at the posterior descending branch with 99% stenosis into the posterior lateral branch.  The PDA and PL fill by collaterals from the left.  An intra-aortic balloon pump was placed in the Cath Lab.  Past Medical History:  Diagnosis Date  . Hearing loss   . HTN (hypertension)     Past Surgical History:  Procedure Laterality Date  . EYE SURGERY    . IABP INSERTION N/A 01/30/2021   Procedure: IABP Insertion;  Surgeon: Leonie Man, MD;  Location: [facility];  Service: Cardiovascular;  Laterality: N/A;  . RIGHT/LEFT HEART CATH AND CORONARY ANGIOGRAPHY N/A 01/30/2021   Procedure: RIGHT/LEFT HEART CATH AND CORONARY ANGIOGRAPHY;  Surgeon: Leonie Man, MD;  Location: [facility];  Service: Cardiovascular;  Laterality: N/A;    History reviewed. No pertinent family history.  Social History:  reports that he has quit smoking. His smoking use included cigarettes. He does not have any smokeless tobacco history on file. No history on file for alcohol use and drug use.  Allergies: No Known Allergies  Medications:  I have reviewed the patient's current  medications. Prior to Admission:  Medications Prior to Admission  Medication Sig Dispense Refill Last Dose  . amLODipine (NORVASC) 5 MG tablet Take by mouth. (Patient not taking: Reported on 01/29/2021)   Not Taking at Unknown time  . colchicine 0.6 MG tablet Take 0.6-1.2 mg by mouth daily as needed. (Patient not taking: Reported on 01/29/2021)   Not Taking at Unknown time  . metFORMIN (GLUCOPHAGE) 500 MG tablet Take 500 mg by mouth 2 (two) times daily with a meal. (Patient not taking: No sig reported)   Not Taking at Unknown time   Scheduled: . aspirin EC  81 mg Oral Daily  . atorvastatin  80 mg Oral Daily  . Chlorhexidine Gluconate Cloth  6 each Topical Daily  . furosemide      . insulin aspart  0-9 Units Subcutaneous Q4H  . isosorbide-hydrALAZINE  1 tablet Oral TID  . sodium chloride flush  3 mL Intravenous Q12H  . sodium chloride flush  3 mL Intravenous Q12H  . sodium chloride flush  3 mL Intravenous Q12H   Continuous: . sodium chloride 20 mL/hr at 01/31/21 0700  . sodium chloride    . dexmedetomidine (PRECEDEX) IV infusion 0.2 mcg/kg/hr (01/31/21 0700)  . heparin 850 Units/hr (01/31/21 0700)  . nitroGLYCERIN 5 mcg/min (01/31/21 0400)  . sodium chloride irrigation     KZL:DJTTSV chloride, acetaminophen, nitroGLYCERIN, ondansetron (ZOFRAN) IV, sodium chloride flush, sodium chloride flush Anti-infectives (From admission, onward)  None      Results for orders placed or performed during the hospital encounter of 01/28/21 (from the past 48 hour(s))  Troponin I (High Sensitivity)     Status: Abnormal   Collection Time: 01/29/21  7:25 AM  Result Value Ref Range   Troponin I (High Sensitivity) 16,150 (HH) <18 ng/L    Comment: CRITICAL VALUE NOTED.  VALUE IS CONSISTENT WITH PREVIOUSLY REPORTED AND CALLED VALUE. (NOTE) Elevated high sensitivity troponin I (hsTnI) values and significant  changes across serial measurements may suggest ACS but many other  chronic and acute conditions  are known to elevate hsTnI results.  Refer to the Links section for chest pain algorithms and additional  guidance. Performed at [facility], [place] [address], [place], [place] [address]   Troponin I (High Sensitivity)     Status: Abnormal   Collection Time: 01/29/21  9:13 AM  Result Value Ref Range   Troponin I (High Sensitivity) 14,097 (HH) <18 ng/L    Comment: CRITICAL VALUE NOTED.  VALUE IS CONSISTENT WITH PREVIOUSLY REPORTED AND CALLED VALUE. (NOTE) Elevated high sensitivity troponin I (hsTnI) values and significant  changes across serial measurements may suggest ACS but many other  chronic and acute conditions are known to elevate hsTnI results.  Refer to the Links section for chest pain algorithms and additional  guidance. Performed at [facility], [place] [address], [place], [place] [address]   Basic metabolic panel     Status: Abnormal   Collection Time: 01/29/21  9:13 AM  Result Value Ref Range   Sodium 135 135 - 145 mmol/L   Potassium 4.9 3.5 - 5.1 mmol/L   Chloride 100 98 - 111 mmol/L   CO2 14 (L) 22 - 32 mmol/L   Glucose, Bld 314 (H) 70 - 99 mg/dL    Comment: Glucose reference range applies only to samples taken after fasting for at least 8 hours.   BUN 33 (H) 8 - 23 mg/dL   Creatinine, Ser 2.27 (H) 0.61 - 1.24 mg/dL   Calcium 8.6 (L) 8.9 - 10.3 mg/dL   GFR, Estimated 28 (L) >60 mL/min    Comment: (NOTE) Calculated using the CKD-EPI Creatinine Equation (2021)    Anion gap 21 (H) 5 - 15    Comment: Performed at [facility] [address], [place], [place] [address]  Creatinine, urine, random     Status: None   Collection Time: 01/29/21 11:34 AM  Result Value Ref Range   Creatinine, Urine 35.54 mg/dL    Comment: Performed at [facility] [address], [place], [place] [address]  Sodium, urine, random     Status: None   Collection Time: 01/29/21 11:34 AM  Result Value Ref Range   Sodium, Ur 115 mmol/L    Comment: Performed at [facility] [address], [place],  [address]  CBG monitoring, ED     Status: Abnormal   Collection Time: 01/29/21 11:35 AM  Result Value Ref Range   Glucose-Capillary 227 (H) 70 - 99 mg/dL    Comment: Glucose reference range applies only to samples taken after fasting for at least 8 hours.  Troponin I (High Sensitivity)     Status: Abnormal   Collection Time: 01/29/21 12:10 PM  Result Value Ref Range   Troponin I (High Sensitivity) 16,532 (HH) <18 ng/L    Comment: CRITICAL VALUE NOTED.  VALUE IS CONSISTENT WITH PREVIOUSLY REPORTED AND CALLED VALUE. (NOTE) Elevated high  sensitivity troponin I (hsTnI) values and significant  changes across serial measurements may suggest ACS but many other  chronic and acute conditions are known to elevate hsTnI results.  Refer to the Links section for chest pain algorithms and additional  guidance. Performed at [facility], [place] [address] [place], [place] [address]   CBG monitoring, ED     Status: Abnormal   Collection Time: 01/29/21  4:54 PM  Result Value Ref Range   Glucose-Capillary 192 (H) 70 - 99 mg/dL    Comment: Glucose reference range applies only to samples taken after fasting for at least 8 hours.  Troponin I (High Sensitivity)     Status: Abnormal   Collection Time: 01/29/21  5:00 PM  Result Value Ref Range   Troponin I (High Sensitivity) 14,215 (HH) <18 ng/L    Comment: CRITICAL VALUE NOTED.  VALUE IS CONSISTENT WITH PREVIOUSLY REPORTED AND CALLED VALUE. (NOTE) Elevated high sensitivity troponin I (hsTnI) values and significant  changes across serial measurements may suggest ACS but many other  chronic and acute conditions are known to elevate hsTnI results.  Refer to the Links section for chest pain algorithms and additional  guidance. Performed at [facility], [place] [address], [place], [place] [address]   Troponin I (High Sensitivity)     Status: Abnormal   Collection Time: 01/29/21  7:45 PM  Result  Value Ref Range   Troponin I (High Sensitivity) 15,900 (HH) <18 ng/L    Comment: CRITICAL VALUE NOTED.  VALUE IS CONSISTENT WITH PREVIOUSLY REPORTED AND CALLED VALUE. (NOTE) Elevated high sensitivity troponin I (hsTnI) values and significant  changes across serial measurements may suggest ACS but many other  chronic and acute conditions are known to elevate hsTnI results.  Refer to the Links section for chest pain algorithms and additional  guidance. Performed at [facility], [place] [address], [place], [place] [address]   Heparin level (unfractionated)     Status: None   Collection Time: 01/29/21  8:04 PM  Result Value Ref Range   Heparin Unfractionated 0.43 0.30 - 0.70 IU/mL    Comment: (NOTE) If heparin results are below expected values, and patient dosage has  been confirmed, suggest follow up testing of antithrombin III levels. Performed at [facility], [place] [address] [place], [place] [address]   CBG monitoring, ED     Status: Abnormal   Collection Time: 01/29/21  8:28 PM  Result Value Ref Range   Glucose-Capillary 137 (H) 70 - 99 mg/dL    Comment: Glucose reference range applies only to samples taken after fasting for at least 8 hours.  CBG monitoring, ED     Status: Abnormal   Collection Time: 01/30/21 12:09 AM  Result Value Ref Range   Glucose-Capillary 137 (H) 70 - 99 mg/dL    Comment: Glucose reference range applies only to samples taken after fasting for at least 8 hours.  Glucose, capillary     Status: Abnormal   Collection Time: 01/30/21  1:08 AM  Result Value Ref Range   Glucose-Capillary 144 (H) 70 - 99 mg/dL    Comment: Glucose reference range applies only to samples taken after fasting for at least 8 hours.  Glucose, capillary     Status: Abnormal   Collection Time: 01/30/21  4:17 AM  Result Value Ref Range   Glucose-Capillary 117 (H) 70 - 99 mg/dL    Comment: Glucose reference range applies only to samples taken after fasting  for at least 8  hours.  SARS Coronavirus 2 by RT PCR (hospital order, performed in [facility] hospital lab) Nasopharyngeal Nasopharyngeal Swab     Status: None   Collection Time: 01/30/21  4:41 AM   Specimen: Nasopharyngeal Swab  Result Value Ref Range   SARS Coronavirus 2 NEGATIVE NEGATIVE    Comment: (NOTE) SARS-CoV-2 target nucleic acids are NOT DETECTED.  The SARS-CoV-2 RNA is generally detectable in upper and lower respiratory specimens during the acute phase of infection. The lowest concentration of SARS-CoV-2 viral copies this assay can detect is 250 copies / mL. A negative result does not preclude SARS-CoV-2 infection and should not be used as the sole basis for treatment or other patient management decisions.  A negative result may occur with improper specimen collection / handling, submission of specimen other than nasopharyngeal swab, presence of viral mutation(s) within the areas targeted by this assay, and inadequate number of viral copies (<250 copies / mL). A negative result must be combined with clinical observations, patient history, and epidemiological information.  Fact Sheet for Patients:   StrictlyIdeas.no  Fact Sheet for Healthcare Providers: BankingDealers.co.za  This test is not yet approved or  cleared by the [place] FDA and has been authorized for detection and/or diagnosis of SARS-CoV-2 by FDA under an Emergency Use Authorization (EUA).  This EUA will remain in effect (meaning this test can be used) for the duration of the COVID-19 declaration under Section 564(b)(1) of the Act, 21 U.S.C. section 360bbb-3(b)(1), unless the authorization is terminated or revoked sooner.  Performed at [facility], [place] [address] [place], [place] [address]   Basic metabolic panel     Status: Abnormal   Collection Time: 01/30/21  4:42 AM  Result Value Ref Range   Sodium 138 135 - 145 mmol/L   Potassium 4.3 3.5 - 5.1 mmol/L    Chloride 104 98 - 111 mmol/L   CO2 21 (L) 22 - 32 mmol/L   Glucose, Bld 120 (H) 70 - 99 mg/dL    Comment: Glucose reference range applies only to samples taken after fasting for at least 8 hours.   BUN 48 (H) 8 - 23 mg/dL   Creatinine, Ser 2.26 (H) 0.61 - 1.24 mg/dL   Calcium 9.2 8.9 - 10.3 mg/dL   GFR, Estimated 28 (L) >60 mL/min    Comment: (NOTE) Calculated using the CKD-EPI Creatinine Equation (2021)    Anion gap 13 5 - 15    Comment: Performed at [facility] [address] [place], [place] [address]  CBC     Status: Abnormal   Collection Time: 01/30/21  4:42 AM  Result Value Ref Range   WBC 17.8 (H) 4.0 - 10.5 K/uL   RBC 4.22 4.22 - 5.81 MIL/uL   Hemoglobin 11.0 (L) 13.0 - 17.0 g/dL   HCT 35.1 (L) 39.0 - 52.0 %   MCV 83.2 80.0 - 100.0 fL   MCH 26.1 26.0 - 34.0 pg   MCHC 31.3 30.0 - 36.0 g/dL   RDW 13.3 11.5 - 15.5 %   Platelets 493 (H) 150 - 400 K/uL   nRBC 0.0 0.0 - 0.2 %    Comment: Performed at [facility], [place] [address], [place], [place] [address]  Magnesium     Status: None   Collection Time: 01/30/21  4:42 AM  Result Value Ref Range   Magnesium 1.8 1.7 - 2.4 mg/dL    Comment: Performed at [facility], [address] [place], [place] [address]  Hepatic function panel     Status: Abnormal   Collection Time: 01/30/21  4:42 AM  Result Value Ref Range   Total Protein 6.5 6.5 - 8.1 g/dL   Albumin 3.6 3.5 - 5.0 g/dL   AST 57 (H) 15 - 41 U/L   ALT 31 0 - 44 U/L   Alkaline Phosphatase 49 38 - 126 U/L   Total Bilirubin 0.6 0.3 - 1.2 mg/dL   Bilirubin, Direct <0.1 0.0 - 0.2 mg/dL   Indirect Bilirubin NOT CALCULATED 0.3 - 0.9 mg/dL    Comment: Performed at [facility] [address], [place], [place] [address]  Heparin level (unfractionated)     Status: None   Collection Time: 01/30/21  4:42 AM  Result Value Ref Range   Heparin Unfractionated 0.32 0.30 - 0.70 IU/mL    Comment: (NOTE) If heparin results are below expected values, and  patient dosage has  been confirmed, suggest follow up testing of antithrombin III levels. Performed at [facility], [place] [address] [place], [place] [address]   Glucose, capillary     Status: Abnormal   Collection Time: 01/30/21  7:27 AM  Result Value Ref Range   Glucose-Capillary 117 (H) 70 - 99 mg/dL    Comment: Glucose reference range applies only to samples taken after fasting for at least 8 hours.  Glucose, capillary     Status: Abnormal   Collection Time: 01/30/21 12:24 PM  Result Value Ref Range   Glucose-Capillary 123 (H) 70 - 99 mg/dL    Comment: Glucose reference range applies only to samples taken after fasting for at least 8 hours.  I-STAT 7, (LYTES, BLD GAS, ICA, H+H)     Status: Abnormal   Collection Time: 01/30/21  1:35 PM  Result Value Ref Range   pH, Arterial 7.415 7.350 - 7.450   pCO2 arterial 40.4 32.0 - 48.0 mmHg   pO2, Arterial 79 (L) 83.0 - 108.0 mmHg   Bicarbonate 25.9 20.0 - 28.0 mmol/L   TCO2 27 22 - 32 mmol/L   O2 Saturation 96.0 %   Acid-Base Excess 1.0 0.0 - 2.0 mmol/L   Sodium 138 135 - 145 mmol/L   Potassium 4.2 3.5 - 5.1 mmol/L   Calcium, Ion 1.25 1.15 - 1.40 mmol/L   HCT 33.0 (L) 39.0 - 52.0 %   Hemoglobin 11.2 (L) 13.0 - 17.0 g/dL   Sample type ARTERIAL   POCT I-Stat EG7     Status: Abnormal   Collection Time: 01/30/21  1:41 PM  Result Value Ref Range   pH, Ven 7.394 7.250 - 7.430   pCO2, Ven 45.0 44.0 - 60.0 mmHg   pO2, Ven 37.0 32.0 - 45.0 mmHg   Bicarbonate 27.5 20.0 - 28.0 mmol/L   TCO2 29 22 - 32 mmol/L   O2 Saturation 69.0 %   Acid-Base Excess 2.0 0.0 - 2.0 mmol/L   Sodium 139 135 - 145 mmol/L   Potassium 4.2 3.5 - 5.1 mmol/L   Calcium, Ion 1.24 1.15 - 1.40 mmol/L   HCT 33.0 (L) 39.0 - 52.0 %   Hemoglobin 11.2 (L) 13.0 - 17.0 g/dL   Sample type VENOUS    Comment NOTIFIED PHYSICIAN   POCT I-Stat EG7     Status: Abnormal   Collection Time: 01/30/21  1:41 PM  Result Value Ref Range   pH, Ven 7.383 7.250 - 7.430   pCO2, Ven  43.7 (L) 44.0 -  60.0 mmHg   pO2, Ven 36.0 32.0 - 45.0 mmHg   Bicarbonate 26.0 20.0 - 28.0 mmol/L   TCO2 27 22 - 32 mmol/L   O2 Saturation 68.0 %   Acid-Base Excess 1.0 0.0 - 2.0 mmol/L   Sodium 139 135 - 145 mmol/L   Potassium 4.1 3.5 - 5.1 mmol/L   Calcium, Ion 1.21 1.15 - 1.40 mmol/L   HCT 33.0 (L) 39.0 - 52.0 %   Hemoglobin 11.2 (L) 13.0 - 17.0 g/dL   Sample type VENOUS    Comment NOTIFIED PHYSICIAN   Glucose, capillary     Status: Abnormal   Collection Time: 01/30/21  4:59 PM  Result Value Ref Range   Glucose-Capillary 116 (H) 70 - 99 mg/dL    Comment: Glucose reference range applies only to samples taken after fasting for at least 8 hours.  Surgical pcr screen     Status: None   Collection Time: 01/30/21  8:34 PM   Specimen: Nasal Mucosa; Nasal Swab  Result Value Ref Range   MRSA, PCR NEGATIVE NEGATIVE   Staphylococcus aureus NEGATIVE NEGATIVE    Comment: (NOTE) The Xpert SA Assay (FDA approved for NASAL specimens in patients 48 years of age and older), is one component of a comprehensive surveillance program. It is not intended to diagnose infection nor to guide or monitor treatment. Performed at [facility], [place] [address], [place], [place] [address]   Glucose, capillary     Status: Abnormal   Collection Time: 01/30/21  9:17 PM  Result Value Ref Range   Glucose-Capillary 111 (H) 70 - 99 mg/dL    Comment: Glucose reference range applies only to samples taken after fasting for at least 8 hours.  Glucose, capillary     Status: Abnormal   Collection Time: 01/30/21 11:58 PM  Result Value Ref Range   Glucose-Capillary 132 (H) 70 - 99 mg/dL    Comment: Glucose reference range applies only to samples taken after fasting for at least 8 hours.  Heparin level (unfractionated)     Status: Abnormal   Collection Time: 01/30/21 11:59 PM  Result Value Ref Range   Heparin Unfractionated 0.12 (L) 0.30 - 0.70 IU/mL    Comment: (NOTE) If heparin results are below expected  values, and patient dosage has  been confirmed, suggest follow up testing of antithrombin III levels. Performed at [facility], [place] [address] [place], [place] [address]   Basic metabolic panel     Status: Abnormal   Collection Time: 01/31/21  1:06 AM  Result Value Ref Range   Sodium 137 135 - 145 mmol/L   Potassium 4.1 3.5 - 5.1 mmol/L   Chloride 104 98 - 111 mmol/L   CO2 19 (L) 22 - 32 mmol/L   Glucose, Bld 131 (H) 70 - 99 mg/dL    Comment: Glucose reference range applies only to samples taken after fasting for at least 8 hours.   BUN 44 (H) 8 - 23 mg/dL   Creatinine, Ser 2.34 (H) 0.61 - 1.24 mg/dL   Calcium 8.7 (L) 8.9 - 10.3 mg/dL   GFR, Estimated 27 (L) >60 mL/min    Comment: (NOTE) Calculated using the CKD-EPI Creatinine Equation (2021)    Anion gap 14 5 - 15    Comment: Performed at [facility] [address], [place], [place] [address]  CBC     Status: Abnormal   Collection Time: 01/31/21  1:06 AM  Result Value Ref Range  WBC 14.3 (H) 4.0 - 10.5 K/uL   RBC 4.19 (L) 4.22 - 5.81 MIL/uL   Hemoglobin 11.4 (L) 13.0 - 17.0 g/dL   HCT 34.1 (L) 39.0 - 52.0 %   MCV 81.4 80.0 - 100.0 fL   MCH 27.2 26.0 - 34.0 pg   MCHC 33.4 30.0 - 36.0 g/dL   RDW 13.3 11.5 - 15.5 %   Platelets 479 (H) 150 - 400 K/uL   nRBC 0.0 0.0 - 0.2 %    Comment: Performed at [facility], [place] [address] [place], [place] [address]  Glucose, capillary     Status: Abnormal   Collection Time: 01/31/21  4:11 AM  Result Value Ref Range   Glucose-Capillary 141 (H) 70 - 99 mg/dL    Comment: Glucose reference range applies only to samples taken after fasting for at least 8 hours.    DG Chest 1 View  Result Date: 01/31/2021 CLINICAL DATA:  80 year old male intra-aortic balloon pump. In STEMI. Heart failure. EXAM: CHEST  1 VIEW COMPARISON:  CT chest 01/30/2021 and earlier. FINDINGS: AP chest at 0425 hours. Mildly larger lung volumes from the CT yesterday. Stable cardiac size and  mediastinal contours. Marker of intra-aortic balloon pump at the descending thoracic aorta located just below the level of the left mainstem bronchus, unchanged from CT. No pneumothorax. Pulmonary vascularity has improved since 01/28/2021, no overt edema. No pleural effusion or confluent pulmonary opacity. Visualized tracheal air column is within normal limits. Stable visualized osseous structures. IMPRESSION: 1. Intra-aortic balloon pump marker in the descending aorta, unchanged from CT yesterday. 2. Regressed/resolved pulmonary edema since 01/28/2021. Electronically Signed   By: Genevie Ann M.D.   On: 01/31/2021 04:49   CT CHEST WO CONTRAST  Result Date: 01/31/2021 CLINICAL DATA:  Chest pain, pre CABG EXAM: CT CHEST WITHOUT CONTRAST TECHNIQUE: Multidetector CT imaging of the chest was performed following the standard protocol without IV contrast. COMPARISON:  None. FINDINGS: Cardiovascular: Heart is borderline in size. Aorta normal caliber. Diffuse aortic calcifications. Moderate coronary artery and great vessel calcifications. Presumed intra-aortic balloon pump within the aorta with the tip in the mid descending thoracic aorta. Mediastinum/Nodes: No mediastinal, hilar, or axillary adenopathy. Trachea and esophagus are unremarkable. Thyroid unremarkable. Lungs/Pleura: 9 mm nodule in the left apex. Calcified granulomas in the right lung. No effusions. Upper Abdomen: Calcified granulomas in the liver and spleen. Musculoskeletal: Chest wall soft tissues are unremarkable. No acute bony abnormality. IMPRESSION: Coronary artery disease. 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 Old granulomatous disease. 9 mm nodule in the left apex. Consider one of the following in 3 months for both low-risk and high-risk individuals: (a) repeat chest CT, (b) follow-up PET-CT, or (c) tissue sampling. This recommendation follows the consensus statement: Guidelines for Management of Incidental  Pulmonary Nodules Detected on CT Images: From the Fleischner Society 2017; Radiology 2017; 284:228-243. Aortic Atherosclerosis (ICD10-I70.0). Electronically Signed   By: Rolm Baptise M.D.   On: 01/31/2021 00:08   CARDIAC CATHETERIZATION  Result Date: 04/24/6269  LV end diastolic pressure is moderately elevated.  There is no aortic valve stenosis.  Hemodynamic findings consistent with mild pulmonary hypertension. With mildly elevated LVEDP. Normal cardiac output and index by Fick  -------------------------  Prox RCA to Mid RCA lesion is 50% stenosed.  Dist RCA lesion is 99% (subtotal occlusion) stenosed with 90% stenosed side branch in RPAV. Distal RPL fills via left-to-right collaterals  RPDA lesion is 100% stenosed. PDA fills via  septal collaterals  Ostial LM 50% stenosis. Dist LM to Ost LAD lesion is 60% stenosed with 80% stenosed side branch in Ost Cx.  Mid LAD lesion is 90% stenosed with 40% stenosed side branch in 2nd Diag.  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  CVTS consulted.  Per request, we will check a noncontrasted chest CT  Dr. Gardiner Rhyme has agreed to discussed the case with the patient's daughter who is a cardiac nurse.  With the language barrier this is very difficult discussion having he is extremely scared.  At this point I think his only real option is bypass surgery.  IV heparin was started per pharmacy protocol for IABP. He was hemodynamically stable, chest pain-free and breathing well leave  the Cath Lab. Glenetta Hew, MD   US RENAL  Result Date: 01/29/2021 CLINICAL DATA:  AKI EXAM: RENAL / URINARY TRACT ULTRASOUND COMPLETE COMPARISON:  10/15/2018 and prior. FINDINGS: Right Kidney: Renal measurements: 8.9 x 4.5 x 4.9 cm = volume: 102.7 mL. Diffuse cortical thinning. Echogenicity within normal limits. No mass or hydronephrosis visualized. Left Kidney: Renal measurements: 9.4 x 5.5 x 5.3 cm. = volume: 143.9 mL. Diffuse cortical thinning. Echogenicity within normal limits. No mass or hydronephrosis visualized. Bladder: Appears normal for degree of bladder distention. Other: Enlarged prostate measuring 6.5 x 3.9 x 5.0 cm. IMPRESSION: Bilateral cortical atrophy. Otherwise unremarkable sonographic appearance of the kidneys. Prostatomegaly. Electronically Signed   By: Primitivo Gauze M.D.   On: 01/29/2021 12:55   ECHOCARDIOGRAM COMPLETE  Result Date: 01/29/2021    ECHOCARDIOGRAM REPORT   Patient Name:   ANTWAN PANDYA Date of Exam: 01/29/2021 Medical Rec #:  [id number]          Height: Accession #:    [id number]         Weight: Date of Birth:  28-Jan-1941           BSA: Patient Age:    50 years           BP:           181/76 mmHg Patient Gender: M                  HR:           73 bpm. Exam Location:  Inpatient Procedure: 2D Echo, Cardiac Doppler, Color Doppler and Strain Analysis Indications:    CHF-Acute Systolic T01.60  History:        Patient has no prior history of Echocardiogram examinations.                 Signs/Symptoms:Chest Pain; Risk Factors:Hypertension, Diabetes                 and Dyslipidemia. Elevated Troponin.  Sonographer:    Darlina Sicilian RDCS Referring Phys: [place]  1. Akinesis of the inferolateral wall; hypokinesis of the inferior wall; overall moderate LV dysfunction.  2. Left ventricular ejection fraction, by estimation, is 35 to 40%. The left ventricle has moderately  decreased function. The left ventricle demonstrates regional wall motion  abnormalities (see scoring diagram/findings for description). There is moderate left ventricular hypertrophy. Left ventricular diastolic parameters are consistent with Grade I diastolic dysfunction (impaired relaxation).  3. Right ventricular systolic function is normal. The right ventricular size is normal.  4. The mitral valve is normal in structure. Trivial mitral valve regurgitation. No evidence of mitral stenosis.  5. The aortic valve is tricuspid. Aortic valve regurgitation is mild. Mild aortic valve sclerosis is present, with no evidence of aortic valve stenosis. FINDINGS  Left Ventricle: Left ventricular ejection fraction, by estimation, is 35 to 40%. The left ventricle has moderately decreased function. The left ventricle demonstrates regional wall motion abnormalities. The left ventricular internal cavity size was normal in size. There is moderate left ventricular hypertrophy. Left ventricular diastolic parameters are consistent with Grade I diastolic dysfunction (impaired relaxation). Right Ventricle: The right ventricular size is normal. Right ventricular systolic function is normal. Left Atrium: Left atrial size was normal in size. Right Atrium: Right atrial size was normal in size. Pericardium: There is no evidence of pericardial effusion. Mitral Valve: The mitral valve is normal in structure. Trivial mitral valve regurgitation. No evidence of mitral valve stenosis. Tricuspid Valve: The tricuspid valve is normal in structure. Tricuspid valve regurgitation is trivial. No evidence of tricuspid stenosis. Aortic Valve: The aortic valve is tricuspid. Aortic valve regurgitation is mild. Aortic regurgitation PHT measures 419 msec. Mild aortic valve sclerosis is present, with no evidence of aortic valve stenosis. Pulmonic Valve: The pulmonic valve was not well visualized. Pulmonic valve regurgitation is trivial. No evidence of pulmonic stenosis. Aorta: The aortic root is normal in size and structure. Venous:  The inferior vena cava was not well visualized.  Additional Comments: Akinesis of the inferolateral wall; hypokinesis of the inferior wall; overall moderate LV dysfunction.  LEFT VENTRICLE PLAX 2D LVIDd:         4.70 cm LVIDs:         3.70 cm LV PW:         1.30 cm LV IVS:        1.50 cm LVOT diam:     2.00 cm LVOT Area:     3.14 cm  LV Volumes (MOD) LV vol d, MOD A2C: 107.0 ml LV vol d, MOD A4C: 167.0 ml LV vol s, MOD A2C: 61.2 ml LV vol s, MOD A4C: 93.9 ml LV SV MOD A2C:     45.8 ml LV SV MOD A4C:     167.0 ml LV SV MOD BP:      62.5 ml RIGHT VENTRICLE RV S prime:     18.30 cm/s TAPSE (M-mode): 1.2 cm LEFT ATRIUM LA diam:      4.50 cm LA Vol (A2C): 46.9 ml LA Vol (A4C): 63.6 ml  AORTIC VALVE AI PHT:      419 msec  AORTA Ao Root diam: 3.20 cm Ao Asc diam:  3.30 cm  SHUNTS Systemic Diam: 2.00 cm Kirk Ruths MD Electronically signed by Kirk Ruths MD Signature Date/Time: 01/29/2021/12:59:05 PM    Final     ROS:  + chest pain +SOB   Blood pressure (!) 147/64, pulse (!) 56, temperature 99.3 F (37.4 C), temperature source Axillary, resp. rate 17, height 5' 9"  (1.753 m), weight 67.9 kg, SpO2 99 %. Physical Exam Constitutional:      Appearance: Normal appearance.  HENT:     Head: Normocephalic and atraumatic.  Eyes:     Extraocular Movements: Extraocular movements  intact.     Pupils: Pupils are equal, round, and reactive to light.  Neck:     Vascular: No carotid bruit.  Cardiovascular:     Rate and Rhythm: Normal rate and regular rhythm.     Heart sounds: Normal heart sounds. No murmur heard.     Comments: Right femoral IABP Pulmonary:     Effort: Pulmonary effort is normal.     Breath sounds: Normal breath sounds.  Abdominal:     General: Abdomen is flat.     Palpations: Abdomen is soft.  Musculoskeletal:        General: No swelling.  Skin:    General: Skin is warm and dry.  Neurological:     General: No focal deficit present.     Mental Status: He is alert.      ECHOCARDIOGRAM REPORT       Patient Name:  FREDERICO GERLING Date of Exam: 01/29/2021  Medical Rec #: [id number]     Height:  Accession #:  [id number]     Weight:  Date of Birth: 1941-12-03      BSA:  Patient Age:  56 years      BP:      181/76 mmHg  Patient Gender: M         HR:      73 bpm.  Exam Location: Inpatient   Procedure: 2D Echo, Cardiac Doppler, Color Doppler and Strain Analysis   Indications:  CHF-Acute Systolic W29.56    History:    Patient has no prior history of Echocardiogram  examinations.         Signs/Symptoms:Chest Pain; Risk Factors:Hypertension,  Diabetes         and Dyslipidemia. Elevated Troponin.    Sonographer:  Darlina Sicilian RDCS  Referring Phys: [place]    1. Akinesis of the inferolateral wall; hypokinesis of the inferior wall;  overall moderate LV dysfunction.  2. Left ventricular ejection fraction, by estimation, is 35 to 40%. The  left ventricle has moderately decreased function. The left ventricle  demonstrates regional wall motion abnormalities (see scoring  diagram/findings for description). There is  moderate left ventricular hypertrophy. Left ventricular diastolic  parameters are consistent with Grade I diastolic dysfunction (impaired  relaxation).  3. Right ventricular systolic function is normal. The right ventricular  size is normal.  4. The mitral valve is normal in structure. Trivial mitral valve  regurgitation. No evidence of mitral stenosis.  5. The aortic valve is tricuspid. Aortic valve regurgitation is mild.  Mild aortic valve sclerosis is present, with no evidence of aortic valve  stenosis.   FINDINGS  Left Ventricle: Left ventricular ejection fraction, by estimation, is 35  to 40%. The left ventricle has moderately decreased function. The left  ventricle demonstrates regional wall motion abnormalities. The left   ventricular internal cavity size was  normal in size. There is moderate left ventricular hypertrophy. Left  ventricular diastolic parameters are consistent with Grade I diastolic  dysfunction (impaired relaxation).   Right Ventricle: The right ventricular size is normal. Right ventricular  systolic function is normal.   Left Atrium: Left atrial size was normal in size.   Right Atrium: Right atrial size was normal in size.   Pericardium: There is no evidence of pericardial effusion.   Mitral Valve: The mitral valve is normal in structure. Trivial mitral  valve regurgitation. No evidence of mitral valve stenosis.   Tricuspid Valve: The tricuspid valve is normal in structure. Tricuspid  valve regurgitation is trivial. No evidence of tricuspid stenosis.   Aortic Valve: The aortic valve is tricuspid. Aortic valve regurgitation is  mild. Aortic regurgitation PHT measures 419 msec. Mild aortic valve  sclerosis is present, with no evidence of aortic valve stenosis.   Pulmonic Valve: The pulmonic valve was not well visualized. Pulmonic valve  regurgitation is trivial. No evidence of pulmonic stenosis.   Aorta: The aortic root is normal in size and structure.   Venous: The inferior vena cava was not well visualized.    Additional Comments: Akinesis of the inferolateral wall; hypokinesis of  the inferior wall; overall moderate LV dysfunction.     LEFT VENTRICLE  PLAX 2D  LVIDd:     4.70 cm  LVIDs:     3.70 cm  LV PW:     1.30 cm  LV IVS:    1.50 cm  LVOT diam:   2.00 cm  LVOT Area:   3.14 cm    LV Volumes (MOD)  LV vol d, MOD A2C: 107.0 ml  LV vol d, MOD A4C: 167.0 ml  LV vol s, MOD A2C: 61.2 ml  LV vol s, MOD A4C: 93.9 ml  LV SV MOD A2C:   45.8 ml  LV SV MOD A4C:   167.0 ml  LV SV MOD BP:   62.5 ml   RIGHT VENTRICLE  RV S prime:   18.30 cm/s  TAPSE (M-mode): 1.2 cm   LEFT ATRIUM  LA diam:   4.50 cm  LA Vol (A2C): 46.9 ml  LA Vol  (A4C): 63.6 ml  AORTIC VALVE  AI PHT:   419 msec    AORTA  Ao Root diam: 3.20 cm  Ao Asc diam: 3.30 cm     SHUNTS  Systemic Diam: 2.00 cm   Kirk Ruths MD  Electronically signed by Kirk Ruths MD  Signature Date/Time: 01/29/2021/12:59:05 PM      Final   Physicians  Panel Physicians Referring Physician Case Authorizing Physician  Leonie Man, MD (Primary)      Procedures  IABP Insertion  RIGHT/LEFT HEART CATH AND CORONARY ANGIOGRAPHY   Conclusion    LV end diastolic pressure is moderately elevated.  There is no aortic valve stenosis.  Hemodynamic findings consistent with mild pulmonary hypertension. With mildly elevated LVEDP. Normal cardiac output and index by Fick  -------------------------  Prox RCA to Mid RCA lesion is 50% stenosed.  Dist RCA lesion is 99% (subtotal occlusion) stenosed with 90% stenosed side branch in RPAV. Distal RPL fills via left-to-right collaterals  RPDA lesion is 100% stenosed. PDA fills via septal collaterals  Ostial LM 50% stenosis. Dist LM to Ost LAD lesion is 60% stenosed with 80% stenosed side branch in Ost Cx.  Mid LAD lesion is 90% stenosed with 40% stenosed side branch in 2nd Diag.   SUMMARY  Severe Multivessel CAD:  ? Ostial LM 40 to 50%, distal LM 50 to 60% involving ostial LCx 80;   mid LAD 90% (at major septal and diagonal branches);  ? 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  CVTS consulted.  Per request, we will check a noncontrasted chest CT  Dr. Gardiner Rhyme  has agreed to discussed the case with the patient's daughter who is a cardiac nurse.  With the  language barrier this is very difficult discussion having he is extremely scared.  At this point I think his only real option is bypass surgery.  IV heparin was started per pharmacy protocol for IABP.  He was hemodynamically stable, chest pain-free and breathing well leave the Cath Lab.   Glenetta Hew, MD      Recommendations  Antiplatelet/Anticoag Recommend Aspirin 72m daily for moderate CAD. Patient has severe multivessel disease with CHF and reduced EF.  Given his CKD, would not be a good PCI option.  There would be extensive amount of PCI and the amount of contrast used will be significant.  Would likely need support for the left main portion, but the RCA lesion appears to be more acute.  Recommend CVTS consultation  Discharge Date Anticipated discharge date to be determined.   Indications  Non-ST elevation MI (NSTEMI) ([facility] [I21.4 (ICD-10-CM)]  Acute cardiogenic pulmonary edema (HCC) [I50.1 (ICD-10-CM)]  Ischemic cardiomyopathy [I25.5 (ICD-10-CM)]  Acute combined systolic and diastolic heart failure ([place] [I50.41 (ICD-10-CM)]   Procedural Details  Technical Details PCP: MJilda Panda MD [facility]ist: Dr. PHeath LarkCardiologist: Dr. SNechama Guard 80y.o. male [place].  Thankfully, his [place] cardiac [place], T2DM who was admitted with NSTEMI-complicated by echocardiogram showing echocardiogram with EF of 35 to 40% with akinetic inferolateral wall, acute pulmonary edema with acute combined systolic and diastolic heart failure.  Other complicating feature is include CKD-unknown baseline creatinine-leading to delay cardiac catheterization.  Cardiac catheterization was delayed from 01/29/2021 due to renal insufficiency.  He is remained chest pain-free and lying flat.  Diuresis slowed because of renal function concerns.  He is now referred for diagnostic Right and Left Heart  Catheterization.  Time Out: Verified patient identification, verified procedure, site/side was marked, verified correct patient position, special equipment/implants available, medications/allergies/relevent history reviewed, required imaging and test results available. Performed.  Access:  RIGHT Radial Artery: 6 Fr sheath -- Seldinger technique using Micropuncture Kit Direct ultrasound guidance used.  Permanent image obtained and placed on chart. 10 mL radial cocktail IA; 4000 Units IV Heparin * Right Brachial/Antecubital Vein: The existing 18-gauge IV was exchanged over a wire for a 5Fr  Sheath * Right Common Femoral Artery: 6 French sheath-upsized to 7.5 French IABP sheath; fluoroscopic and direct ultrasound guidance (image obtained and placed on chart)  Right Heart Catheterization: 5 Fr SGordy Councilmancatheter advanced under fluoroscopy with balloon inflated to the RA, RV, then PCWP-PA for hemodynamic measurement.  * Simultaneous FA & PA blood gases checked for SaO2% to calculate FICK CO/CI  * Catheter removed completely out of the body with balloon deflated.  Left Heart Catheterization: 5Fr Catheters advanced or exchanged over a J-wire under direct fluoroscopic guidance into the ascending aorta; TIG 4.0 catheter advanced first.  * LV Hemodynamics (LV Gram): TIG 4.0 catheter  * Right & Left Coronary Artery Cineangiography: TIG 4.0 catheter   Upon completion of Angiogaphy, the catheter was removed completely out of the body over a wire, without complication.  Femora IABP sheath was sutured in place with catheter cover advanced.  Stabilizing clips placed..   Right Brachial Venous Sheath removed with manual pressure held for hemostasis.   Radial sheath removed in the Cardiac Catheterization lab with TR Band placed for hemostasis. TR Band: 1430 Hours; 14 mL air  MEDICATIONS * SQ Lidocaine 376m*  Radial Cocktail: 3 mg Verapmil in 10 mL NS * Isovue Contrast: 60MOL * Heparin: 4000  Units   Estimated blood loss <50 mL.   During this procedure medications were administered to achieve and maintain moderate conscious sedation while the patient's heart rate, blood pressure, and oxygen saturation were continuously monitored and I was present face-to-face 100% of this time.   Medications (Filter: Administrations occurring from 1309 to 1451 on 01/30/21) (important) Continuous medications are totaled by the amount administered until 01/30/21 1451.    midazolam (VERSED) injection (mg) Total dose:  1 mg  Date/Time Rate/Dose/Volume Action   01/30/21 1327 1 mg Given    Heparin (Porcine) in NaCl 1000-0.9 UT/500ML-% SOLN (mL) Total volume:  1,000 mL  Date/Time Rate/Dose/Volume Action   01/30/21 1327 500 mL Given   1327 500 mL Given    lidocaine (PF) (XYLOCAINE) 1 % injection (mL) Total volume:  14 mL  Date/Time Rate/Dose/Volume Action   01/30/21 1328 2 mL Given   1329 2 mL Given   1407 10 mL Given    Radial Cocktail/Verapamil only (mL) Total volume:  10 mL  Date/Time Rate/Dose/Volume Action   01/30/21 1332 10 mL Given    heparin sodium (porcine) injection (Units) Total dose:  4,000 Units  Date/Time Rate/Dose/Volume Action   01/30/21 1343 4,000 Units Given    iohexol (OMNIPAQUE) 350 MG/ML injection (mL) Total volume:  60 mL  Date/Time Rate/Dose/Volume Action   01/30/21 1429 60 mL Given    acetaminophen (TYLENOL) tablet 650 mg (mg) Total dose:  Cannot be calculated*  *Administration dose not documented Date/Time Rate/Dose/Volume Action   01/30/21 1309 *Not included in total MAR Hold   1448 *Not included in total MAR Unhold    aspirin EC tablet 81 mg (mg) Total dose:  Cannot be calculated*  *Administration dose not documented Date/Time Rate/Dose/Volume Action   01/30/21 1309 *Not included in total MAR Hold   1448 *Not included in total MAR Unhold    atorvastatin (LIPITOR) tablet 80 mg (mg) Total dose:  Cannot be  calculated*  *Administration dose not documented Date/Time Rate/Dose/Volume Action   01/30/21 1309 *Not included in total MAR Hold   1448 *Not included in total MAR Unhold    Chlorhexidine Gluconate Cloth 2 % PADS 6 each (each) Total dose:  Cannot be calculated*  *Administration dose not documented Date/Time Rate/Dose/Volume Action   01/30/21 1309 *Not included in total MAR Hold   1448 *Not included in total MAR Unhold    insulin aspart (novoLOG) injection 0-9 Units (Units) Total dose:  Cannot be calculated*  *Administration dose not documented Date/Time Rate/Dose/Volume Action   01/30/21 1309 *Not included in total MAR Hold   1448 *Not included in total MAR Unhold    isosorbide-hydrALAZINE (BIDIL) 20-37.5 MG per tablet 1 tablet (tablet) Total dose:  Cannot be calculated*  *Administration dose not documented Date/Time Rate/Dose/Volume Action   01/30/21 1309 *Not included in total MAR Hold   1448 *Not included in total MAR Unhold    nitroGLYCERIN (NITROSTAT) SL tablet 0.4 mg (mg) Total dose:  Cannot be calculated*  *Administration dose not documented Date/Time Rate/Dose/Volume Action   01/30/21 1309 *Not included in total MAR Hold   1448 *Not included in total MAR Unhold    ondansetron (ZOFRAN) injection 4 mg (mg) Total dose:  Cannot be calculated*  *Administration dose not documented Date/Time Rate/Dose/Volume Action   01/30/21 1309 *Not included in total MAR Hold   1448 *Not included in total MAR Unhold  sodium chloride flush (NS) 0.9 % injection 3 mL (mL) Total dose:  Cannot be calculated*  *Administration dose not documented Date/Time Rate/Dose/Volume Action   01/30/21 1309 *Not included in total MAR Hold   1448 *Not included in total MAR Unhold    sodium chloride flush (NS) 0.9 % injection 3 mL (mL) Total dose:  Cannot be calculated*  *Administration dose not documented Date/Time Rate/Dose/Volume Action   01/30/21 1309 *Not included in total MAR Hold    1448 *Not included in total MAR Unhold    sodium chloride flush (NS) 0.9 % injection 3 mL (mL) Total dose:  Cannot be calculated*  *Administration dose not documented Date/Time Rate/Dose/Volume Action   01/30/21 1309 *Not included in total MAR Hold   1448 *Not included in total MAR Unhold    0.9 % sodium chloride infusion (mL) Total dose:  Cannot be calculated*  *Administration dose not documented Date/Time Rate/Dose/Volume Action   01/30/21 1309 *Not included in total MAR Hold   1448 *Not included in total MAR Unhold    heparin ADULT infusion 100 units/mL (25000 units/260m) (Units/hr) Total dose:  Cannot be calculated*  *Administration dose not documented Date/Time Rate/Dose/Volume Action   01/30/21 1315  Stopped    Sedation Time  Sedation Time Physician-1: 53 minutes 44 seconds   Contrast  Medication Name Total Dose  iohexol (OMNIPAQUE) 350 MG/ML injection 60 mL    Radiation/Fluoro  Fluoro time: 5.1 (min) DAP: 15221 (mGycm2) Cumulative Air Kerma: 309 (mGy)   Coronary Findings   Diagnostic Dominance: Right  Left Main  Ost LM lesion is 50% stenosed. The lesion is focal and eccentric. Aorto-ostial Significant catheter damping upon engagement. No catheter blush from 5 French catheter  Dist LM to Ost LAD lesion is 60% stenosed with 80% stenosed side branch in Ost Cx. The lesion is located at the bifurcation, focal, eccentric and irregular. The lesion is moderately calcified.  Left Anterior Descending  Mid LAD lesion is 90% stenosed with 40% stenosed side branch in 2nd Diag. The lesion is located at the bifurcation and concentric.  First Diagonal Branch  Vessel is small in size.  First Septal Branch  Vessel is small in size.  Second Septal Branch  Vessel is moderate in size. Vessel is angiographically normal. Significant, major septal perforator trunk with 2 major branches. This provides over 90% of the septal perforator branches.  Ramus Intermedius   Vessel is small.  Left Circumflex  First Obtuse Marginal Branch  Vessel is small in size.  Second Obtuse Marginal Branch  Vessel is small in size.  Left Posterior Atrioventricular Artery  Vessel is small in size.  Right Coronary Artery  Vessel was injected. Vessel is large. The vessel is tortuous.  Prox RCA to Mid RCA lesion is 50% stenosed. The lesion is eccentric.  Dist RCA lesion is 99% stenosed with 90% stenosed side branch in RPAV. Vessel is the culprit lesion. The lesion is located proximal to the major branch, irregular, thrombotic and ulcerative.  Acute Marginal Branch  Vessel is small in size.  Right Ventricular Branch  Vessel is small in size.  Right Posterior Descending Artery  RPDA lesion is 100% stenosed. Vessel is the culprit lesion. The lesion is focal, thrombotic and ulcerative.  Inferior Septal  Collaterals  Inf Sept filled by collaterals from 2nd Sept.    First Right Posterolateral Branch  Vessel is small in size.  Second Right Posterolateral Branch  Vessel is large in size.  Collaterals  2nd RPL filled by  collaterals from Dist Cx.    Collaterals  2nd RPL filled by collaterals from 1st LPL.     Intervention   No interventions have been documented.  Right Heart  Right Heart Pressures Hemodynamic findings consistent with mild pulmonary hypertension. Average PA pressures: 48/10 mmHg-mean 26 mmHg. PCWP mean 18 mmHg Elevated LV EDP consistent with volume overload. LVP-EP: 120/4 mmHg - 20 mmHg Ao sat 96%, PA sat 69%. Cardiac Output-Index Kathlen [place]): 6.05-3.29   Impella/IABP  Hemodynamic Support An IABP was inserted for hemodynamic support in the setting of cardiogenic shock. Access site:  right femoral artery. Access was obtained using a 6 French sheath in the right femoral artery using direct ultrasound guidance. -> Sheath was exchanged for the 7.5 French IABP sheath. --After adequate priming and flushing, the IABP was advanced into the aortic knob,  confirmed fluoroscopically.  Lines were flushed and was hooked up to 1: 1. --> Catheter cover advanced to the sheath, then sheath was sutured in place.  Remainder the IABP apparatus was secured.   Wall Motion  Resting    No LV gram performed to conserve contrast. By Echo, there is inferolateral hypokinesis and EF of 35 to 40%.         Left Heart  Left Ventricle LV end diastolic pressure is moderately elevated.  Aortic Valve There is no aortic valve stenosis. The aortic valve is calcified. There is normal aortic valve motion.   Coronary Diagrams   Diagnostic Dominance: Right    Intervention    Implants    No implant documentation for this case.   Syngo Images  Show images for CARDIAC CATHETERIZATION  Images on Long Term Storage  Show images for [place], [place]  Link to Procedure Log  Procedure Log     Hemo Data  Flowsheet Row Most Recent Value  Fick Cardiac Output 6.05 L/min  Fick Cardiac Output Index 3.29 (L/min)/BSA  RA A Wave 15 mmHg  RA V Wave 13 mmHg  RA Mean 12 mmHg  RV Systolic Pressure 45 mmHg  RV Diastolic Pressure 5 mmHg  RV EDP 11 mmHg  PA Systolic Pressure 34 mmHg  PA Diastolic Pressure 6 mmHg  PA Mean 20 mmHg  PW A Wave 17 mmHg  PW V Wave 17 mmHg  PW Mean 11 mmHg  AO Systolic Pressure 081 mmHg  AO Diastolic Pressure 54 mmHg  AO Mean 83 mmHg  LV Systolic Pressure 448 mmHg  LV Diastolic Pressure 4 mmHg  LV EDP 17 mmHg  AOp Systolic Pressure 185 mmHg  AOp Diastolic Pressure 51 mmHg  AOp Mean Pressure 79 mmHg  LVp Systolic Pressure 631 mmHg  LVp Diastolic Pressure 6 mmHg  LVp EDP Pressure 19 mmHg  QP/QS 1  TPVR Index 10.03 HRUI  TSVR Index 25.23 HRUI  PVR SVR Ratio 0.31  TPVR/TSVR Ratio 0.4   Narrative & Impression  CLINICAL DATA:  Chest pain, pre CABG  EXAM: CT CHEST WITHOUT CONTRAST  TECHNIQUE: Multidetector CT imaging of the chest was performed following the standard protocol without IV  contrast.  COMPARISON:  None.  FINDINGS: Cardiovascular: Heart is borderline in size. Aorta normal caliber. Diffuse aortic calcifications. Moderate coronary artery and great vessel calcifications. Presumed intra-aortic balloon pump within the aorta with the tip in the mid descending thoracic aorta.  Mediastinum/Nodes: No mediastinal, hilar, or axillary adenopathy. Trachea and esophagus are unremarkable. Thyroid unremarkable.  Lungs/Pleura: 9 mm nodule in the left apex. Calcified granulomas in the right lung. No effusions.  Upper Abdomen: Calcified granulomas  in the liver and spleen.  Musculoskeletal: Chest wall soft tissues are unremarkable. No acute bony abnormality.  IMPRESSION: Coronary artery disease.  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  Old granulomatous disease.  9 mm nodule in the left apex. Consider one of the following in 3 months for both low-risk and high-risk individuals: (a) repeat chest CT, (b) follow-up PET-CT, or (c) tissue sampling. This recommendation follows the consensus statement: Guidelines for Management of Incidental Pulmonary Nodules Detected on CT Images: From the Fleischner Society 2017; Radiology 2017; 284:228-243.  Aortic Atherosclerosis (ICD10-I70.0).   Electronically Signed   By: Rolm Baptise M.D.   On: 01/31/2021 00:08      Assessment/Plan:  This 80 year old gentleman has left main and severe three-vessel coronary disease with moderate left ventricular systolic dysfunction as well as multiple comorbid risk factors including stage IV chronic kidney disease with a creatinine of 2.34 this morning.  His creatinine was noted to be 2.62 in 2012.  Review of his cardiac catheterization showed that his aortic root and proximal ascending aorta were calcified and therefore we did a CT scan of the chest last night for further evaluation which confirmed diffuse calcification of the  aortic root and ascending aorta extending into the aortic arch.  This precludes doing coronary artery bypass graft surgery on this patient.  Cardiology will need to consider whether doing high risk PCI is an option.  Gaye Pollack 01/31/2021, 7:19 AM

## 2021-01-31 NOTE — Progress Notes (Signed)
ANTICOAGULATION CONSULT NOTE  Pharmacy Consult for heparin Indication: chest pain/ACS  No Known Allergies  Patient Measurements: Height: 5\' 9"  (175.3 cm) Weight: 67.9 kg (149 lb 11.1 oz) (IABP present) IBW/kg (Calculated) : 70.7 Heparin Dosing Weight: 67 kg (based on historical records)   Vital Signs: Temp: 97.8 F (36.6 C) (02/10 1137) Temp Source: Oral (02/10 1137) Pulse Rate: 62 (02/10 1400)  Labs: Recent Labs    01/28/21 2322 01/29/21 0043 01/29/21 0913 01/29/21 1210 01/29/21 1700 01/29/21 1945 01/29/21 2004 01/30/21 0442 01/30/21 1335 01/30/21 1341 01/30/21 2359 01/31/21 0106 01/31/21 1217  HGB 12.1*  --   --   --   --   --   --  11.0* 11.2* 11.2*  11.2*  --  11.4*  --   HCT 37.9*  --   --   --   --   --   --  35.1* 33.0* 33.0*  33.0*  --  34.1*  --   PLT 501*  --   --   --   --   --   --  493*  --   --   --  479*  --   HEPARINUNFRC  --   --   --   --   --   --    < > 0.32  --   --  0.12*  --  0.21*  CREATININE 1.75*  --  2.27*  --   --   --   --  2.26*  --   --   --  2.34*  --   TROPONINIHS 238*   < > 14,097* 16,532* 14,215* 15,900*  --   --   --   --   --   --   --    < > = values in this interval not displayed.    Estimated Creatinine Clearance: 23.8 mL/min (A) (by C-G formula based on SCr of 2.34 mg/dL (H)).    Assessment: 64 YOM who presents with chest pain and SOB now with rising troponins to start IV heparin. Trops are trending down at this time.   Heparin level therapeutic (0.21) on gtt at 850 units/hr. No bleeding noted.   Goal of Therapy:  Heparin level 0.2-0.5 units/ml Monitor platelets by anticoagulation protocol: Yes   Plan:  IV heparin gtt at 850 units/hr now. Daily heparin level and CBC.  Erin Hearing PharmD., BCPS Clinical Pharmacist 01/31/2021 2:53 PM

## 2021-01-31 NOTE — Progress Notes (Addendum)
Advanced Heart Failure Rounding Note  PCP-Cardiologist: No primary care provider on file.   Subjective:    On IABP 1:1 for coronary perfusion while awaiting revascularization options. Not a candidate for CABG.    No events overnight.   SCr up post cath, 2.26>>2.34.  CO2 19   I/Os not accurate due to CBI   Objective:   Weight Range: 67.9 kg Body mass index is 22.11 kg/m.   Vital Signs:   Temp:  [98.5 F (36.9 C)-99.7 F (37.6 C)] 99.3 F (37.4 C) (02/10 0400) Pulse Rate:  [47-149] 56 (02/10 0700) Resp:  [8-80] 17 (02/10 0700) BP: (109-173)/(55-149) 147/64 (02/09 2100) SpO2:  [68 %-100 %] 99 % (02/10 0700) Arterial Line BP: (89-176)/(34-99) 119/44 (02/10 0700) Weight:  [67.9 kg] 67.9 kg (02/10 0500)    Weight change: Filed Weights   01/30/21 0119 01/31/21 0500  Weight: 68.6 kg 67.9 kg    Intake/Output:   Intake/Output Summary (Last 24 hours) at 01/31/2021 0731 Last data filed at 01/31/2021 0700 Gross per 24 hour  Intake 4603.22 ml  Output 9225 ml  Net -4621.78 ml      Physical Exam    General:  Well appearing elderly male laying flat in bed No resp difficulty HEENT: Normal Neck: Supple. No JVP . Carotids 2+ bilat; no bruits. No lymphadenopathy or thyromegaly appreciated. Cor: PMI nondisplaced. Regular rate & rhythm. No rubs, gallops or murmurs. Lungs: Clear Abdomen: Soft, nontender, nondistended. No hepatosplenomegaly. No bruits or masses. Good bowel sounds. Extremities: No cyanosis, clubbing, rash, edema + Rt femoral IABP site stable  Neuro: Alert & orientedx3, cranial nerves grossly intact. moves all 4 extremities w/o difficulty. Affect pleasant GU: + foley w/ CBI, urine clear    Telemetry   NSR 80s-90s   EKG    No new EKG to review   Labs    CBC Recent Labs    01/28/21 2322 01/30/21 0442 01/30/21 1335 01/30/21 1341 01/31/21 0106  WBC 15.7* 17.8*  --   --  14.3*  NEUTROABS 12.3*  --   --   --   --   HGB 12.1* 11.0*   < > 11.2*   11.2* 11.4*  HCT 37.9* 35.1*   < > 33.0*  33.0* 34.1*  MCV 84.2 83.2  --   --  81.4  PLT 501* 493*  --   --  479*   < > = values in this interval not displayed.   Basic Metabolic Panel Recent Labs    01/30/21 0442 01/30/21 1335 01/30/21 1341 01/31/21 0106  NA 138   < > 139  139 137  K 4.3   < > 4.1  4.2 4.1  CL 104  --   --  104  CO2 21*  --   --  19*  GLUCOSE 120*  --   --  131*  BUN 48*  --   --  44*  CREATININE 2.26*  --   --  2.34*  CALCIUM 9.2  --   --  8.7*  MG 1.8  --   --   --    < > = values in this interval not displayed.   Liver Function Tests Recent Labs    01/30/21 0442  AST 57*  ALT 31  ALKPHOS 49  BILITOT 0.6  PROT 6.5  ALBUMIN 3.6   No results for input(s): LIPASE, AMYLASE in the last 72 hours. Cardiac Enzymes No results for input(s): CKTOTAL, CKMB, CKMBINDEX, TROPONINI in the last  72 hours.  BNP: BNP (last 3 results) Recent Labs    01/28/21 2322  BNP 643.2*    ProBNP (last 3 results) No results for input(s): PROBNP in the last 8760 hours.   D-Dimer No results for input(s): DDIMER in the last 72 hours. Hemoglobin A1C Recent Labs    01/29/21 0352  HGBA1C 8.1*   Fasting Lipid Panel Recent Labs    01/29/21 0352  CHOL 211*  HDL 68  LDLCALC 129*  TRIG 71  CHOLHDL 3.1   Thyroid Function Tests Recent Labs    01/29/21 0351  TSH 1.851    Other results:   Imaging    DG Chest 1 View  Result Date: 01/31/2021 CLINICAL DATA:  80 year old male intra-aortic balloon pump. In STEMI. Heart failure. EXAM: CHEST  1 VIEW COMPARISON:  CT chest 01/30/2021 and earlier. FINDINGS: AP chest at 0425 hours. Mildly larger lung volumes from the CT yesterday. Stable cardiac size and mediastinal contours. Marker of intra-aortic balloon pump at the descending thoracic aorta located just below the level of the left mainstem bronchus, unchanged from CT. No pneumothorax. Pulmonary vascularity has improved since 01/28/2021, no overt edema. No pleural  effusion or confluent pulmonary opacity. Visualized tracheal air column is within normal limits. Stable visualized osseous structures. IMPRESSION: 1. Intra-aortic balloon pump marker in the descending aorta, unchanged from CT yesterday. 2. Regressed/resolved pulmonary edema since 01/28/2021. Electronically Signed   By: Genevie Ann M.D.   On: 01/31/2021 04:49   CT CHEST WO CONTRAST  Result Date: 01/31/2021 CLINICAL DATA:  Chest pain, pre CABG EXAM: CT CHEST WITHOUT CONTRAST TECHNIQUE: Multidetector CT imaging of the chest was performed following the standard protocol without IV contrast. COMPARISON:  None. FINDINGS: Cardiovascular: Heart is borderline in size. Aorta normal caliber. Diffuse aortic calcifications. Moderate coronary artery and great vessel calcifications. Presumed intra-aortic balloon pump within the aorta with the tip in the mid descending thoracic aorta. Mediastinum/Nodes: No mediastinal, hilar, or axillary adenopathy. Trachea and esophagus are unremarkable. Thyroid unremarkable. Lungs/Pleura: 9 mm nodule in the left apex. Calcified granulomas in the right lung. No effusions. Upper Abdomen: Calcified granulomas in the liver and spleen. Musculoskeletal: Chest wall soft tissues are unremarkable. No acute bony abnormality. IMPRESSION: Coronary artery disease. 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 Old granulomatous disease. 9 mm nodule in the left apex. Consider one of the following in 3 months for both low-risk and high-risk individuals: (a) repeat chest CT, (b) follow-up PET-CT, or (c) tissue sampling. This recommendation follows the consensus statement: Guidelines for Management of Incidental Pulmonary Nodules Detected on CT Images: From the Fleischner Society 2017; Radiology 2017; 284:228-243. Aortic Atherosclerosis (ICD10-I70.0). Electronically Signed   By: Rolm Baptise M.D.   On: 01/31/2021 00:08   CARDIAC CATHETERIZATION  Result Date: 01/31/7988   LV end diastolic pressure is moderately elevated.  There is no aortic valve stenosis.  Hemodynamic findings consistent with mild pulmonary hypertension. With mildly elevated LVEDP. Normal cardiac output and index by Fick  -------------------------  Prox RCA to Mid RCA lesion is 50% stenosed.  Dist RCA lesion is 99% (subtotal occlusion) stenosed with 90% stenosed side branch in RPAV. Distal RPL fills via left-to-right collaterals  RPDA lesion is 100% stenosed. PDA fills via septal collaterals  Ostial LM 50% stenosis. Dist LM to Ost LAD lesion is 60% stenosed with 80% stenosed side branch in Ost Cx.  Mid LAD lesion is 90% stenosed with 40% stenosed side branch in 2nd Diag.  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  CVTS consulted.  Per request, we will check a noncontrasted chest CT  Dr. Gardiner Rhyme has agreed to discussed the case with the patient's daughter who is a cardiac nurse.  With the language barrier this is very difficult discussion having he is extremely scared.  At this point I think his only real option is bypass surgery.  IV heparin was started per pharmacy protocol for IABP. He was hemodynamically stable, chest pain-free and breathing well leave the Cath Lab. Glenetta Hew, MD      Medications:     Scheduled Medications: . aspirin EC  81 mg Oral Daily  . atorvastatin  80 mg Oral Daily  . Chlorhexidine Gluconate Cloth  6 each Topical Daily  . furosemide      . insulin aspart  0-9 Units  Subcutaneous Q4H  . isosorbide-hydrALAZINE  1 tablet Oral TID  . sodium chloride flush  3 mL Intravenous Q12H  . sodium chloride flush  3 mL Intravenous Q12H  . sodium chloride flush  3 mL Intravenous Q12H     Infusions: . sodium chloride 20 mL/hr at 01/31/21 0700  . sodium chloride    . dexmedetomidine (PRECEDEX) IV infusion 0.2 mcg/kg/hr (01/31/21 0700)  . heparin 850 Units/hr (01/31/21 0700)  . nitroGLYCERIN 5 mcg/min (01/31/21 0400)  . sodium chloride irrigation       PRN Medications:  sodium chloride, acetaminophen, nitroGLYCERIN, ondansetron (ZOFRAN) IV, sodium chloride flush, sodium chloride flush   Assessment/Plan    1. CAD/ NSTEMI - Hs trop 16,000 - LHC w/ severe MVD w/ LM involvement (see complete angiographic details above) - IABP was placed to help w/ coronary perfusion while awaiting surgical consultation  - Not a candidate for CABG due to diffuse calcification of the aortic root and ascending aorta extending into the aortic arch + Stage IV CKD.  - Need to consider high risk PCI but would be concerned about his degree of renal disease and risk of progressing to ESRD if high risk PCI w/ additional contrast is elected. Consider nephrology consultation to help w/ decision making  - c/w IABP for now at 1:1  - continue IV heparin, ASA + high intensity statin   - off ? blocker due to bradycardia    2. Acute Systolic Heart Failure/ Ischemic CM - EF moderately reduced 35-40%, RV normal  - RHC 2/9 w/ mildly elevated LVEDP. Normal cardiac output and index by Fick. PCWP/LVEDP of 18-20. CI 3.29  - IABP placed to help w/ coronary perfusion - not CABG candidate, being considered for high risk PCI  - appears well compensated from HF standpoint  - No ARB/ ARNi or spiro given abnormal renal function  - continue Bidil for afterload reduction   3. DM - Hgb A1c 8.1  - SSI  - possible SGLT2i pending GFR trend   4. HLD w/ LDL Goal < 70  - LDL 129 - Atorva 80 mg   5.  Hypertension  - aim to  keep SBPs 120-140 - Continue Bidil  - bradycardia limits ? blocker - no ARB/spiro w/ CKD    6. CKD - SCr trend 1.75>>2.27>>2.26 - Unclear baseline, but notably creatinine was 2.6 on previous labs from 10 years ago. Suspect likely CKD - Renal US shows significant medicorenal disease - would be concerned about his degree of renal disease and risk of progressing to ESRD if high risk PCI w/ additional contrast is elected. Consider nephrology consultation to help w/ decision making  - consider SGLT2i if GFR remains > 25 given concomitant CV disease and DM     Length of Stay: 2  Randy Jester, PA-C  01/31/2021, 7:31 AM  Advanced Heart Failure Team Pager [phone] (M-F; 7a - 4p)  Please contact [facility] for night-coverage after hours (4p -7a ) and weekends on [email or web address]   Agree with above.   Remains on IABP. No CP or SOB. Still with hematuria. Creatine stable at 2.3  Seen by CT surgery. Not a candidate for CABG due to CKD and heavily calcified aorta.   General:  Lying in bed . No resp difficulty HEENT: normal Neck: supple. no JVD. Carotids 2+ bilat; no bruits. No lymphadenopathy or thryomegaly appreciated. Cor: PMI nondisplaced. Regular rate & rhythm. No rubs, gallops or murmurs. Lungs: clear Abdomen: soft, nontender, nondistended. No hepatosplenomegaly. No bruits or masses. Good bowel sounds. Extremities: no cyanosis, clubbing, rash, edema RFA IABP  + CBI cath with bloody urine  Neuro: alert & orientedx3, cranial nerves grossly intact. moves all 4 extremities w/o difficulty. Affect pleasant  Case d/w TCTS and IC. Not candidate for CABG.   Plan PCI of RCA +/- staged LAD (if we can get through [facility]) tomorrow if renal function stable. Continue IABP overnight. Continue heparin.   CRITICAL CARE Performed by: Glori Bickers  Total critical care time: 35 minutes  Critical care time was exclusive of separately billable procedures and treating  other patients.  Critical care was necessary to treat or prevent imminent or life-threatening deterioration.  Critical care was time spent personally by me (independent of midlevel providers or residents) on the following activities: development of treatment plan with patient and/or surrogate as well as nursing, discussions with consultants, evaluation of patient's response to treatment, examination of patient, obtaining history from patient or surrogate, ordering and performing treatments and interventions, ordering and review of laboratory studies, ordering and review of radiographic studies, pulse oximetry and re-evaluation of patient's condition.   Glori Bickers, MD  9:18 AM

## 2021-01-31 NOTE — Progress Notes (Signed)
PROGRESS NOTE    Randy [name]  PJK:[id number] DOB: 1941/09/17 DOA: 01/28/2021 PCP: Jilda Panda, MD    No chief complaint on file.   Brief Narrative:  Randy [name] is a 80 y.o. male with medical history significant of HTN, presents with chest pain and sob.  Meanwhile pt had traumatic foley placement resulting in hematuria on CBI urine, remains lightly bloody on Slow CBI gtt. Urology consulted and he underwent coude catheter placement.  Pt was also started on nitro gtt for chest pain IV heparin and cardiology consulted.  Underwent cardiac cath showing Severe Multivessel CAD and Moderately reduced LVEF 35 to 40% by Echocardiogram. He was transferred to ICU and CVTS consulted . An intra aortic balloon pump was placed in the cath lab and removed today.  Pt was not deemed a candidate for CABG due to calcifications of the aortic root and ascending aorta extending into aortic arch.  Cardiology plan to do a PCI later tomorrow.    Overnight pt became delirious and agitated and required precedex gtt.      Assessment & Plan:   Principal Problem:   New onset of congestive heart failure (HCC) Active Problems:   Renal insufficiency   Hypertensive crisis   Acute respiratory failure with hypoxia (HCC)   Acute pulmonary edema (HCC)   AKI (acute kidney injury) ([place])   Non-STEMI (non-ST elevated myocardial infarction) ([place])   NSTEMI;  Pt presents with chest pain and sob.  Troponin peaked at 16,532.  Echo showed akinesis of inferolateral wall with hypokinesis of inferior wall , LVEF of 35% to 40 %.  Continue with aspirin, statin, heparin gtt.  Cardiac cath today showed multivessel CAD and reduced LVEF 35%.  Pt was not deemed a candidate for CABG due to calcifications of the aortic root and ascending aorta extending into aortic arch.  Cardiology plan to do a PCI later tomorrow.    Acute combined systolic and diastolic heart failure.  Initially diuresed with IV lasix, renal  function worsened and lasix held.    Hematuria from traumatic foley placement resulting in hematuria on CBI urine, remains lightly bloody on Slow CBI gtt. Urology consulted and he underwent coude catheter placement. flomax once pt stabilizes.   Stage 4 CKD:  Creatinine stable around 2. Continue to monitor.       DVT prophylaxis: heparin.  Code Status: (Full code) Family Communication: none at bedside. Will call daughter and update her.  Disposition:   Status is: Inpatient  Remains inpatient appropriate because:Ongoing diagnostic testing needed not appropriate for outpatient work up, Unsafe d/c plan, IV treatments appropriate due to intensity of illness or inability to take PO and Inpatient level of care appropriate due to severity of illness   Dispo: The patient is from: Home              Anticipated d/c is to: Home              Anticipated d/c date is: > 3 days              Patient currently is not medically stable to d/c.   Difficult to place patient No       Level of care: ICU Consultants:   Cardiology.  Cardiothoracic surgery.   Procedures: cardiac cath.    Antimicrobials: none.    Subjective: Confused.   Objective: Vitals:   01/31/21 1300 01/31/21 1400 01/31/21 1500 01/31/21 1600  BP:      Pulse: (!) 59 62 74 78  Resp:  16 (!) 30 19 (!) 22  Temp:      TempSrc:      SpO2: 100% 100% 100% 99%  Weight:      Height:        Intake/Output Summary (Last 24 hours) at 01/31/2021 1716 Last data filed at 01/31/2021 0900 Gross per 24 hour  Intake 4096.18 ml  Output 3325 ml  Net 771.18 ml   Filed Weights   01/30/21 0119 01/31/21 0500  Weight: 68.6 kg 67.9 kg    Examination:  General exam: alert and not in distress on 2 lit of [place] oxygen.  Respiratory system: Air entry fair, on 2lit of  oxygen.  Cardiovascular system: S1s2 heard RRR no JVD. No pedal edema.  Gastrointestinal system: Abdomen is soft, NT ND BS+ Central nervous system: Confused, but able  to move all extremities.  Extremities: No edema.  Skin: No rashes seen.  Psychiatry: . Mood is appropriate.    Data Reviewed: I have personally reviewed following labs and imaging studies  CBC: Recent Labs  Lab 01/28/21 2322 01/30/21 0442 01/30/21 1335 01/30/21 1341 01/31/21 0106  WBC 15.7* 17.8*  --   --  14.3*  NEUTROABS 12.3*  --   --   --   --   HGB 12.1* 11.0* 11.2* 11.2*  11.2* 11.4*  HCT 37.9* 35.1* 33.0* 33.0*  33.0* 34.1*  MCV 84.2 83.2  --   --  81.4  PLT 501* 493*  --   --  479*    Basic Metabolic Panel: Recent Labs  Lab 01/28/21 2322 01/29/21 0913 01/30/21 0442 01/30/21 1335 01/30/21 1341 01/31/21 0106  NA 137 135 138 138 139  139 137  K 3.7 4.9 4.3 4.2 4.1  4.2 4.1  CL 103 100 104  --   --  104  CO2 20* 14* 21*  --   --  19*  GLUCOSE 204* 314* 120*  --   --  131*  BUN 30* 33* 48*  --   --  44*  CREATININE 1.75* 2.27* 2.26*  --   --  2.34*  CALCIUM 8.3* 8.6* 9.2  --   --  8.7*  MG  --   --  1.8  --   --   --     GFR: Estimated Creatinine Clearance: 23.8 mL/min (A) (by C-G formula based on SCr of 2.34 mg/dL (H)).  Liver Function Tests: Recent Labs  Lab 01/30/21 0442  AST 57*  ALT 31  ALKPHOS 49  BILITOT 0.6  PROT 6.5  ALBUMIN 3.6    CBG: Recent Labs  Lab 01/30/21 2358 01/31/21 0411 01/31/21 0610 01/31/21 0903 01/31/21 1139  GLUCAP 132* 141* 128* 111* 108*     Recent Results (from the past 240 hour(s))  SARS Coronavirus 2 by RT PCR (hospital order, performed in [facility] hospital lab) Nasopharyngeal Nasopharyngeal Swab     Status: None   Collection Time: 01/30/21  4:41 AM   Specimen: Nasopharyngeal Swab  Result Value Ref Range Status   SARS Coronavirus 2 NEGATIVE NEGATIVE Final    Comment: (NOTE) SARS-CoV-2 target nucleic acids are NOT DETECTED.  The SARS-CoV-2 RNA is generally detectable in upper and lower respiratory specimens during the acute phase of infection. The lowest concentration of SARS-CoV-2 viral copies  this assay can detect is 250 copies / mL. A negative result does not preclude SARS-CoV-2 infection and should not be used as the sole basis for treatment or other patient management decisions.  A negative result may  occur with improper specimen collection / handling, submission of specimen other than nasopharyngeal swab, presence of viral mutation(s) within the areas targeted by this assay, and inadequate number of viral copies (<250 copies / mL). A negative result must be combined with clinical observations, patient history, and epidemiological information.  Fact Sheet for Patients:   StrictlyIdeas.no  Fact Sheet for Healthcare Providers: BankingDealers.co.za  This test is not yet approved or  cleared by the [place] FDA and has been authorized for detection and/or diagnosis of SARS-CoV-2 by FDA under an Emergency Use Authorization (EUA).  This EUA will remain in effect (meaning this test can be used) for the duration of the COVID-19 declaration under Section 564(b)(1) of the Act, 21 U.S.C. section 360bbb-3(b)(1), unless the authorization is terminated or revoked sooner.  Performed at [facility], [place] [address] [place], [place] [address]   Surgical pcr screen     Status: None   Collection Time: 01/30/21  8:34 PM   Specimen: Nasal Mucosa; Nasal Swab  Result Value Ref Range Status   MRSA, PCR NEGATIVE NEGATIVE Final   Staphylococcus aureus NEGATIVE NEGATIVE Final    Comment: (NOTE) The Xpert SA Assay (FDA approved for NASAL specimens in patients 58 years of age and older), is one component of a comprehensive surveillance program. It is not intended to diagnose infection nor to guide or monitor treatment. Performed at [facility], [place] [address], [place], [place] [address]          Radiology Studies: DG Chest 1 View  Result Date: 01/31/2021 CLINICAL DATA:  80 year old male intra-aortic balloon pump. In  STEMI. Heart failure. EXAM: CHEST  1 VIEW COMPARISON:  CT chest 01/30/2021 and earlier. FINDINGS: AP chest at 0425 hours. Mildly larger lung volumes from the CT yesterday. Stable cardiac size and mediastinal contours. Marker of intra-aortic balloon pump at the descending thoracic aorta located just below the level of the left mainstem bronchus, unchanged from CT. No pneumothorax. Pulmonary vascularity has improved since 01/28/2021, no overt edema. No pleural effusion or confluent pulmonary opacity. Visualized tracheal air column is within normal limits. Stable visualized osseous structures. IMPRESSION: 1. Intra-aortic balloon pump marker in the descending aorta, unchanged from CT yesterday. 2. Regressed/resolved pulmonary edema since 01/28/2021. Electronically Signed   By: Genevie Ann M.D.   On: 01/31/2021 04:49   CT CHEST WO CONTRAST  Result Date: 01/31/2021 CLINICAL DATA:  Chest pain, pre CABG EXAM: CT CHEST WITHOUT CONTRAST TECHNIQUE: Multidetector CT imaging of the chest was performed following the standard protocol without IV contrast. COMPARISON:  None. FINDINGS: Cardiovascular: Heart is borderline in size. Aorta normal caliber. Diffuse aortic calcifications. Moderate coronary artery and great vessel calcifications. Presumed intra-aortic balloon pump within the aorta with the tip in the mid descending thoracic aorta. Mediastinum/Nodes: No mediastinal, hilar, or axillary adenopathy. Trachea and esophagus are unremarkable. Thyroid unremarkable. Lungs/Pleura: 9 mm nodule in the left apex. Calcified granulomas in the right lung. No effusions. Upper Abdomen: Calcified granulomas in the liver and spleen. Musculoskeletal: Chest wall soft tissues are unremarkable. No acute bony abnormality. IMPRESSION: Coronary artery disease. 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 Old granulomatous disease. 9 mm nodule in the left apex. Consider one of the following in 3 months for both  low-risk and high-risk individuals: (a) repeat chest CT, (b) follow-up PET-CT, or (c) tissue sampling. This recommendation follows the consensus statement: Guidelines for Management of Incidental Pulmonary Nodules Detected on CT Images: From  the Fleischner Society 2017; Radiology 2017; [phone]. Aortic Atherosclerosis (ICD10-I70.0). Electronically Signed   By: Rolm Baptise M.D.   On: 01/31/2021 00:08   CARDIAC CATHETERIZATION  Result Date: 05/26/7845  LV end diastolic pressure is moderately elevated.  There is no aortic valve stenosis.  Hemodynamic findings consistent with mild pulmonary hypertension. With mildly elevated LVEDP. Normal cardiac output and index by Fick  -------------------------  Prox RCA to Mid RCA lesion is 50% stenosed.  Dist RCA lesion is 99% (subtotal occlusion) stenosed with 90% stenosed side branch in RPAV. Distal RPL fills via left-to-right collaterals  RPDA lesion is 100% stenosed. PDA fills via septal collaterals  Ostial LM 50% stenosis. Dist LM to Ost LAD lesion is 60% stenosed with 80% stenosed side branch in Ost Cx.  Mid LAD lesion is 90% stenosed with 40% stenosed side branch in 2nd Diag.  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  CVTS consulted.  Per request, we will check a noncontrasted chest CT  Dr. Gardiner Rhyme has agreed to discussed the case with the patient's daughter who is a cardiac nurse.  With the language barrier this is very difficult discussion having he is extremely  scared.  At this point I think his only real option is bypass surgery.  IV heparin was started per pharmacy protocol for IABP. He was hemodynamically stable, chest pain-free and breathing well leave the Cath Lab. Glenetta Hew, MD        Scheduled Meds: . atropine      . aspirin  81 mg Oral Daily  . atorvastatin  80 mg Oral Daily  . Chlorhexidine Gluconate Cloth  6 each Topical Daily  . insulin aspart  0-9 Units Subcutaneous Q4H  . isosorbide-hydrALAZINE  1 tablet Oral TID  . sodium chloride flush  3 mL Intravenous Q12H  . sodium chloride flush  3 mL Intravenous Q12H  . sodium chloride flush  3 mL Intravenous Q12H   Continuous Infusions: . sodium chloride 20 mL/hr at 01/31/21 0900  . sodium chloride    . dexmedetomidine (PRECEDEX) IV infusion 0.2 mcg/kg/hr (01/31/21 0900)  . heparin Stopped (01/31/21 1400)  . sodium chloride irrigation       LOS: 2 days        Hosie Poisson, MD [facility]   To contact the attending provider between 7A-7P or the covering provider during after hours 7P-7A, please log into the web site [email or web address] and access using universal [place] password for that web site. If you do not have the password, please call the hospital operator.  01/31/2021, 5:16 PM

## 2021-01-31 NOTE — Care Plan (Signed)
Pt very agitated. Kicking, punching and pulling at IABP, foley and IV tubing. Translator used to communicate with pt, without success. Pt's daughter call, spoke with pt in phone then Video . Daughter stated that Randy [name] did not think he was in the hospital and could not be convinced that we were trying to help him. Hospitalist  Called, orders received. Pt became calmer on medication, restraints in place per order.   IABP site oozing more again, dressing changed. Xray obtained to verify placement

## 2021-01-31 NOTE — Progress Notes (Signed)
   Case reviewed with interventional team. 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 Stable left sided lesions with significant LM disease.    Would favor medical management unless develops refractory angina. Will pull IABP and follow.  Discussed with daughter at bedside.  Glori Bickers, MD  5:59 PM

## 2021-02-01 ENCOUNTER — Inpatient Hospital Stay (HOSPITAL_COMMUNITY): Payer: Medicare Other

## 2021-02-01 DIAGNOSIS — I5041 Acute combined systolic (congestive) and diastolic (congestive) heart failure: Secondary | ICD-10-CM | POA: Diagnosis not present

## 2021-02-01 DIAGNOSIS — I214 Non-ST elevation (NSTEMI) myocardial infarction: Secondary | ICD-10-CM | POA: Diagnosis not present

## 2021-02-01 DIAGNOSIS — N184 Chronic kidney disease, stage 4 (severe): Secondary | ICD-10-CM | POA: Diagnosis not present

## 2021-02-01 DIAGNOSIS — J810 Acute pulmonary edema: Secondary | ICD-10-CM | POA: Diagnosis not present

## 2021-02-01 DIAGNOSIS — R509 Fever, unspecified: Secondary | ICD-10-CM | POA: Diagnosis not present

## 2021-02-01 DIAGNOSIS — M79604 Pain in right leg: Secondary | ICD-10-CM

## 2021-02-01 DIAGNOSIS — I509 Heart failure, unspecified: Secondary | ICD-10-CM | POA: Diagnosis not present

## 2021-02-01 LAB — BASIC METABOLIC PANEL
Anion gap: 12 (ref 5–15)
BUN: 41 mg/dL — ABNORMAL HIGH (ref 8–23)
CO2: 23 mmol/L (ref 22–32)
Calcium: 8.7 mg/dL — ABNORMAL LOW (ref 8.9–10.3)
Chloride: 101 mmol/L (ref 98–111)
Creatinine, Ser: 2.32 mg/dL — ABNORMAL HIGH (ref 0.61–1.24)
GFR, Estimated: 28 mL/min — ABNORMAL LOW (ref >60)
Glucose, Bld: 150 mg/dL — ABNORMAL HIGH (ref 70–99)
Potassium: 4 mmol/L (ref 3.5–5.1)
Sodium: 136 mmol/L (ref 135–145)

## 2021-02-01 LAB — URINALYSIS, COMPLETE (UACMP) WITH MICROSCOPIC
Bilirubin Urine: NEGATIVE
Glucose, UA: NEGATIVE mg/dL
Ketones, ur: NEGATIVE mg/dL
Nitrite: NEGATIVE
Protein, ur: 30 mg/dL — AB
RBC / HPF: >50 RBC/hpf — ABNORMAL HIGH (ref 0–5)
Specific Gravity, Urine: 1.012 (ref 1.005–1.030)
pH: 5 (ref 5.0–8.0)

## 2021-02-01 LAB — CBC
HCT: 33.3 % — ABNORMAL LOW (ref 39.0–52.0)
Hemoglobin: 10.4 g/dL — ABNORMAL LOW (ref 13.0–17.0)
MCH: 26.1 pg (ref 26.0–34.0)
MCHC: 31.2 g/dL (ref 30.0–36.0)
MCV: 83.7 fL (ref 80.0–100.0)
Platelets: 452 10*3/uL — ABNORMAL HIGH (ref 150–400)
RBC: 3.98 MIL/uL — ABNORMAL LOW (ref 4.22–5.81)
RDW: 13.2 % (ref 11.5–15.5)
WBC: 15.5 10*3/uL — ABNORMAL HIGH (ref 4.0–10.5)
nRBC: 0 % (ref 0.0–0.2)

## 2021-02-01 LAB — GLUCOSE, CAPILLARY
Glucose-Capillary: 126 mg/dL — ABNORMAL HIGH (ref 70–99)
Glucose-Capillary: 166 mg/dL — ABNORMAL HIGH (ref 70–99)
Glucose-Capillary: 166 mg/dL — ABNORMAL HIGH (ref 70–99)
Glucose-Capillary: 168 mg/dL — ABNORMAL HIGH (ref 70–99)
Glucose-Capillary: 175 mg/dL — ABNORMAL HIGH (ref 70–99)
Glucose-Capillary: 290 mg/dL — ABNORMAL HIGH (ref 70–99)

## 2021-02-01 LAB — HEPARIN LEVEL (UNFRACTIONATED): Heparin Unfractionated: <0.1 IU/mL — ABNORMAL LOW (ref 0.30–0.70)

## 2021-02-01 MED ORDER — CARVEDILOL 3.125 MG PO TABS
3.1250 mg | ORAL_TABLET | Freq: Two times a day (BID) | ORAL | Status: DC
  Administered 2021-02-01 – 2021-02-04 (×8): 3.125 mg via ORAL
  Filled 2021-02-01 (×8): qty 1

## 2021-02-01 MED ORDER — CEFTRIAXONE SODIUM 1 G IJ SOLR: 1.0000 g | INTRAMUSCULAR | Status: DC

## 2021-02-01 MED ORDER — PREDNISONE 20 MG PO TABS
40.0000 mg | ORAL_TABLET | Freq: Every day | ORAL | Status: AC
  Administered 2021-02-01 – 2021-02-03 (×3): 40 mg via ORAL
  Filled 2021-02-01 (×4): qty 2

## 2021-02-01 MED ORDER — FUROSEMIDE 40 MG PO TABS
40.0000 mg | ORAL_TABLET | Freq: Every day | ORAL | Status: DC
  Administered 2021-02-01: 40 mg via ORAL
  Filled 2021-02-01: qty 1

## 2021-02-01 MED ORDER — SODIUM CHLORIDE 0.9 % IV SOLN
1.0000 g | INTRAVENOUS | Status: DC
  Administered 2021-02-01 – 2021-02-04 (×4): 1 g via INTRAVENOUS
  Filled 2021-02-01 (×4): qty 10

## 2021-02-01 MED ORDER — CLOPIDOGREL BISULFATE 75 MG PO TABS
75.0000 mg | ORAL_TABLET | Freq: Every day | ORAL | Status: DC
  Administered 2021-02-01 – 2021-02-06 (×6): 75 mg via ORAL
  Filled 2021-02-01 (×6): qty 1

## 2021-02-01 NOTE — Progress Notes (Signed)
Progress Note  Patient Name: Randy [name] Date of Encounter: 02/02/2021  [facility] Cardiologist: Donato Heinz, MD   Subjective   Feels better today. Foot pain still present. Breathing improved.   Had episode of hypotension overnight and was given 250cc IVF with improvement. Wt 146-->149lbs Cr 2.32-->2.56  Inpatient Medications    Scheduled Meds: . aspirin  81 mg Oral Daily  . atorvastatin  80 mg Oral Daily  . carvedilol  3.125 mg Oral BID WC  . Chlorhexidine Gluconate Cloth  6 each Topical Daily  . clopidogrel  75 mg Oral Daily  . enoxaparin (LOVENOX) injection  30 mg Subcutaneous Q24H  . insulin aspart  0-15 Units Subcutaneous TID WC  . insulin aspart  0-5 Units Subcutaneous QHS  . isosorbide-hydrALAZINE  1 tablet Oral TID  . predniSONE  40 mg Oral QAC breakfast  . sodium chloride flush  3 mL Intravenous Q12H  . sodium chloride flush  3 mL Intravenous Q12H  . sodium chloride flush  3 mL Intravenous Q12H  . sodium chloride flush  3 mL Intravenous Q12H   Continuous Infusions: . sodium chloride Stopped (02/01/21 0839)  . sodium chloride    . cefTRIAXone (ROCEPHIN)  IV 1 g (02/01/21 1551)  . sodium chloride irrigation     PRN Meds: sodium chloride, acetaminophen, nitroGLYCERIN, ondansetron (ZOFRAN) IV, sodium chloride flush, sodium chloride flush   Vital Signs    Vitals:   02/01/21 2347 02/02/21 0002 02/02/21 0030 02/02/21 0448  BP: (!) 104/49 (!) 113/50  (!) 129/59  Pulse: 61   68  Resp: 16 18  15   Temp: 97.6 F (36.4 C) (!) 96.3 F (35.7 C) (!) 97.5 F (36.4 C) 97.6 F (36.4 C)  TempSrc: Oral Axillary  Oral  SpO2: 96%   97%  Weight:    68 kg  Height:        Intake/Output Summary (Last 24 hours) at 02/02/2021 0854 Last data filed at 02/01/2021 1700 Gross per 24 hour  Intake 593.39 ml  Output 840 ml  Net -246.61 ml   Last 3 Weights 02/02/2021 02/01/2021 01/31/2021  Weight (lbs) 149 lb 14.6 oz 146 lb 2.6 oz 149 lb 11.1 oz  Weight  (kg) 68 kg 66.3 kg 67.9 kg      Telemetry    NSR - Personally Reviewed  ECG    No new tracing - Personally Reviewed  Physical Exam   GEN: No acute distress. Elderly male, comfortable  Neck: No JVD Cardiac: RRR, no murmurs, rubs, or gallops.  Respiratory: Clear to auscultation bilaterally. GI: Soft, nontender, non-distended  MS: No edema; No deformity. Neuro:  Nonfocal  Psych: Normal affect   Labs    High Sensitivity Troponin:   Recent Labs  Lab 01/29/21 0725 01/29/21 0913 01/29/21 1210 01/29/21 1700 01/29/21 1945  TROPONINIHS 16,150* 14,097* 16,532* 14,215* 15,900*      Chemistry Recent Labs  Lab 01/30/21 0442 01/30/21 1335 01/31/21 0106 02/01/21 0151 02/02/21 0130  NA 138   < > 137 136 135  K 4.3   < > 4.1 4.0 4.3  CL 104  --  104 101 101  CO2 21*  --  19* 23 21*  GLUCOSE 120*  --  131* 150* 222*  BUN 48*  --  44* 41* 48*  CREATININE 2.26*  --  2.34* 2.32* 2.56*  CALCIUM 9.2  --  8.7* 8.7* 8.4*  PROT 6.5  --   --   --   --  ALBUMIN 3.6  --   --   --   --   AST 57*  --   --   --   --   ALT 31  --   --   --   --   ALKPHOS 49  --   --   --   --   BILITOT 0.6  --   --   --   --   GFRNONAA 28*  --  27* 28* 24*  ANIONGAP 13  --  14 12 13    < > = values in this interval not displayed.     Hematology Recent Labs  Lab 01/31/21 1819 02/01/21 0151 02/02/21 0130  WBC 13.5* 15.5* 17.1*  RBC 3.92* 3.98* 3.66*  HGB 10.5* 10.4* 9.8*  HCT 32.4* 33.3* 29.9*  MCV 82.7 83.7 81.7  MCH 26.8 26.1 26.8  MCHC 32.4 31.2 32.8  RDW 13.3 13.2 13.1  PLT 446* 452* 443*    BNP Recent Labs  Lab 01/28/21 2322  BNP 643.2*     DDimer No results for input(s): DDIMER in the last 168 hours.   Radiology    DG CHEST PORT 1 VIEW  Result Date: 02/01/2021 CLINICAL DATA:  Fever and shortness of breath EXAM: PORTABLE CHEST 1 VIEW COMPARISON:  January 31, 2021 FINDINGS: There is no edema or airspace opacity. Heart is mildly enlarged with pulmonary vascularity normal.  Intra-aortic balloon pump no longer evident. There is aortic atherosclerosis. No adenopathy. No bone lesions. IMPRESSION: Intra-aortic balloon pump no longer evident. Stable cardiac prominence. Lungs clear. Aortic Atherosclerosis (ICD10-I70.0). Electronically Signed   By: Lowella Grip III M.D.   On: 02/01/2021 09:21   DG Foot Complete Right  Result Date: 02/01/2021 CLINICAL DATA:  Right foot pain. EXAM: RIGHT FOOT COMPLETE - 3+ VIEW COMPARISON:  None. FINDINGS: There is no evidence of fracture or dislocation. There is no evidence of arthropathy or other focal bone abnormality. Soft tissues are unremarkable. IMPRESSION: Negative. Electronically Signed   By: Marijo Conception M.D.   On: 02/01/2021 14:55   VAS [place] LOWER EXTREMITY VENOUS (DVT)  Result Date: 02/01/2021  Lower Venous DVT Study Indications: Pain in back of right leg.  Comparison Study: No prior. Performing Technologist: Oda Cogan RDMS, RVT  Examination Guidelines: A complete evaluation includes B-mode imaging, spectral Doppler, color Doppler, and power Doppler as needed of all accessible portions of each vessel. Bilateral testing is considered an integral part of a complete examination. Limited examinations for reoccurring indications may be performed as noted. The reflux portion of the exam is performed with the patient in reverse Trendelenburg.  +---------+---------------+---------+-----------+----------+--------------+ RIGHT    CompressibilityPhasicitySpontaneityPropertiesThrombus Aging +---------+---------------+---------+-----------+----------+--------------+ CFV      Full           Yes                                          +---------+---------------+---------+-----------+----------+--------------+ SFJ      Full                    Yes                                 +---------+---------------+---------+-----------+----------+--------------+ FV Prox  Full                                                         +---------+---------------+---------+-----------+----------+--------------+  FV Mid   Full                                                        +---------+---------------+---------+-----------+----------+--------------+ FV DistalFull                                                        +---------+---------------+---------+-----------+----------+--------------+ PFV      Full                                                        +---------+---------------+---------+-----------+----------+--------------+ POP      Full           Yes      Yes                                 +---------+---------------+---------+-----------+----------+--------------+ PTV      Full                                                        +---------+---------------+---------+-----------+----------+--------------+ PERO     Full                                                        +---------+---------------+---------+-----------+----------+--------------+   +----+---------------+---------+-----------+----------+--------------+ LEFTCompressibilityPhasicitySpontaneityPropertiesThrombus Aging +----+---------------+---------+-----------+----------+--------------+ CFV Full           Yes      Yes                                 +----+---------------+---------+-----------+----------+--------------+ SFJ Full                                                        +----+---------------+---------+-----------+----------+--------------+     Summary: RIGHT: - There is no evidence of deep vein thrombosis in the lower extremity.  - No cystic structure found in the popliteal fossa.  LEFT: - No evidence of common femoral vein obstruction.  *See table(s) above for measurements and observations.    Preliminary     Cardiac Studies   Echo 01/29/21: 1. Akinesis of the inferolateral wall; hypokinesis of the inferior wall;  overall moderate LV dysfunction.  2. Left ventricular ejection  fraction, by estimation, is 35 to 40%. The  left ventricle has moderately decreased function. The left ventricle  demonstrates regional wall motion abnormalities (see scoring  diagram/findings for description). There is  moderate left ventricular hypertrophy.  Left ventricular diastolic  parameters are consistent with Grade I diastolic dysfunction (impaired  relaxation).  3. Right ventricular systolic function is normal. The right ventricular  size is normal.  4. The mitral valve is normal in structure. Trivial mitral valve  regurgitation. No evidence of mitral stenosis.  5. The aortic valve is tricuspid. Aortic valve regurgitation is mild.  Mild aortic valve sclerosis is present, with no evidence of aortic valve  stenosis.   Cath 12/26/70:  LV end diastolic pressure is moderately elevated.  There is no aortic valve stenosis.  Hemodynamic findings consistent with mild pulmonary hypertension. With mildly elevated LVEDP. Normal cardiac output and index by Fick  -------------------------  Prox RCA to Mid RCA lesion is 50% stenosed.  Dist RCA lesion is 99% (subtotal occlusion) stenosed with 90% stenosed side branch in RPAV. Distal RPL fills via left-to-right collaterals  RPDA lesion is 100% stenosed. PDA fills via septal collaterals  Ostial LM 50% stenosis. Dist LM to Ost LAD lesion is 60% stenosed with 80% stenosed side branch in Ost Cx.  Mid LAD lesion is 90% stenosed with 40% stenosed side branch in 2nd Diag.   SUMMARY  Severe Multivessel CAD:  ? Ostial LM 40 to 50%, distal LM 50 to 60% involving ostial LCx 80;   mid LAD 90% (at major septal and diagonal branches);  ? 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  CVTS consulted.  Per request, we will check a noncontrasted chest CT  Dr. Gardiner Rhyme has agreed to discussed the case with the patient's daughter who is a cardiac nurse.  With the language barrier this is very difficult discussion having he is extremely scared.  At this point I think his only real option is bypass surgery.  IV heparin was started per pharmacy protocol for IABP.    Patient Profile     80 y.o. male HTN and DMII who presented with NSTEMI found to have severe multivessel CAD deemed not to be a surgical or PCI candidate now on medical management.  Assessment & Plan    #NSTEMI #Multivessel CAD: Patient presented with acute chest pain, troponin peaked at 16,532. TTE showed akinesis of inferolateral wall with hypokinesis of inferior wall, EF 35 to 40%, normal RV function, mild AI.  Cath demonstrated ostial LM 40-50%, distal LM 50-60%, ostial LCX 80%, mid LAD 90%, subtotal occluded distal RCA 99%.  RHC with CI 3.3, PWCP 18-20.  Not a candidate for CABG per surgery or PCI per interventional cardiology, now on medical management.  Currently chest pain-free.   -Continue aspirin 81 mg daily -Continue plavix 75mg  daily -Plan for 1 year DAPT due to NSTEMI -Continue atorvastatin 80 mg daily -IABP removed 2/10 -Blood pressure improved this AM; continue coreg 3.125mg  BID--if additional issues with hypotension--can change to metoprolol 12.5mg  XL  #Acute combined systolic and diastolic heart failure:  Secondary to NSTEMI as above.  LVEF 35 to 40% with inferolateral/inferior wall motion abnormalities.  CI 3.3 on RHC, RA 12, RV 45/5, PA 34/6/20, LVEDP 19 -Hold off on ACE/ARB/Arni given kidney function -Continue BiDil 1 tab 3 times daily -Continue carvedilol 3.125 mg BID as above -Hold lasix today given hypotension  overnight; likely resume tomorrow at reduced dose of 20mg  daily -Monitor I/Os and daily  weights -Low Na diet  #RLE pain:  -No evidence of DVT on doppler -Xray of foot without acute pathology -Continue symptomatic management  #Fever:  -Cultures pending -On empiric ABX per IM  #Hematuria:  Noted after Foley catheter attempt yesterday, Foley was placed by urology and started on CBI.  Hematruai resolved, now off CBI -Continue to monitor  #CKD stage IV:  Creatinine up slightly to 2.5 this morning.  -Hold lasix today due to episode of hypotension -Likely resume tomorrow at reduced dose 20mg  daily  #Hyperlipidemia:  LDL 129. 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   -On SSI     For questions or updates, please contact [facility] Please consult [email or web address] for contact info under        Signed, Freada Bergeron, MD  02/02/2021, 8:54 AM

## 2021-02-01 NOTE — Plan of Care (Signed)
  Problem: Clinical Measurements: Goal: Diagnostic test results will improve Outcome: Progressing   Problem: Clinical Measurements: Goal: Respiratory complications will improve Outcome: Progressing   Problem: Clinical Measurements: Goal: Cardiovascular complication will be avoided Outcome: Progressing   

## 2021-02-01 NOTE — Progress Notes (Signed)
Progress Note  Patient Name: Randy [name] Date of Encounter: 02/01/2021  [facility] Cardiologist: Donato Heinz, MD   Subjective   Denies any chest pain or shortness of breath.  Inpatient Medications    Scheduled Meds: . aspirin  81 mg Oral Daily  . atorvastatin  80 mg Oral Daily  . Chlorhexidine Gluconate Cloth  6 each Topical Daily  . enoxaparin (LOVENOX) injection  30 mg Subcutaneous Q24H  . insulin aspart  0-9 Units Subcutaneous Q4H  . isosorbide-hydrALAZINE  1 tablet Oral TID  . sodium chloride flush  3 mL Intravenous Q12H  . sodium chloride flush  3 mL Intravenous Q12H  . sodium chloride flush  3 mL Intravenous Q12H  . sodium chloride flush  3 mL Intravenous Q12H   Continuous Infusions: . sodium chloride 20 mL/hr at 02/01/21 0600  . sodium chloride    . dexmedetomidine (PRECEDEX) IV infusion 0.2 mcg/kg/hr (01/31/21 0900)  . sodium chloride irrigation     PRN Meds: sodium chloride, acetaminophen, nitroGLYCERIN, ondansetron (ZOFRAN) IV, sodium chloride flush, sodium chloride flush   Vital Signs    Vitals:   02/01/21 0500 02/01/21 0600 02/01/21 0700 02/01/21 0801  BP: (!) 184/70  (!) 144/55   Pulse: 98 (!) 101 87   Resp: (!) 26 19 14    Temp:    99.8 F (37.7 C)  TempSrc:      SpO2: 100% 97% 97%   Weight:      Height:        Intake/Output Summary (Last 24 hours) at 02/01/2021 0827 Last data filed at 02/01/2021 0600 Gross per 24 hour  Intake 1061.45 ml  Output 1725 ml  Net -663.55 ml   Last 3 Weights 01/31/2021 01/30/2021  Weight (lbs) 149 lb 11.1 oz 151 lb 3.8 oz  Weight (kg) 67.9 kg 68.6 kg      Telemetry    Normal sinus rhythm with rate 80s- Personally Reviewed  ECG    No new ECG- Personally Reviewed  Physical Exam   GEN: No acute distress.   Neck: No JVD Cardiac: RRR, no murmurs Respiratory:  CTAB GI: Soft, nontender, non-distended  MS:  No edema.  R femoral access site c/d/i, no hematoma Neuro:  Nonfocal  Psych:  Normal affect   Labs    High Sensitivity Troponin:   Recent Labs  Lab 01/29/21 0725 01/29/21 0913 01/29/21 1210 01/29/21 1700 01/29/21 1945  TROPONINIHS 16,150* 14,097* 16,532* 14,215* 15,900*      Chemistry Recent Labs  Lab 01/30/21 0442 01/30/21 1335 01/30/21 1341 01/31/21 0106 02/01/21 0151  NA 138   < > 139  139 137 136  K 4.3   < > 4.1  4.2 4.1 4.0  CL 104  --   --  104 101  CO2 21*  --   --  19* 23  GLUCOSE 120*  --   --  131* 150*  BUN 48*  --   --  44* 41*  CREATININE 2.26*  --   --  2.34* 2.32*  CALCIUM 9.2  --   --  8.7* 8.7*  PROT 6.5  --   --   --   --   ALBUMIN 3.6  --   --   --   --   AST 57*  --   --   --   --   ALT 31  --   --   --   --   ALKPHOS 49  --   --   --   --  BILITOT 0.6  --   --   --   --   GFRNONAA 28*  --   --  27* 28*  ANIONGAP 13  --   --  14 12   < > = values in this interval not displayed.     Hematology Recent Labs  Lab 01/31/21 0106 01/31/21 1819 02/01/21 0151  WBC 14.3* 13.5* 15.5*  RBC 4.19* 3.92* 3.98*  HGB 11.4* 10.5* 10.4*  HCT 34.1* 32.4* 33.3*  MCV 81.4 82.7 83.7  MCH 27.2 26.8 26.1  MCHC 33.4 32.4 31.2  RDW 13.3 13.3 13.2  PLT 479* 446* 452*    BNP Recent Labs  Lab 01/28/21 2322  BNP 643.2*     DDimer No results for input(s): DDIMER in the last 168 hours.   Radiology    DG Chest 1 View  Result Date: 01/31/2021 CLINICAL DATA:  80 year old male intra-aortic balloon pump. In STEMI. Heart failure. EXAM: CHEST  1 VIEW COMPARISON:  CT chest 01/30/2021 and earlier. FINDINGS: AP chest at 0425 hours. Mildly larger lung volumes from the CT yesterday. Stable cardiac size and mediastinal contours. Marker of intra-aortic balloon pump at the descending thoracic aorta located just below the level of the left mainstem bronchus, unchanged from CT. No pneumothorax. Pulmonary vascularity has improved since 01/28/2021, no overt edema. No pleural effusion or confluent pulmonary opacity. Visualized tracheal air column is  within normal limits. Stable visualized osseous structures. IMPRESSION: 1. Intra-aortic balloon pump marker in the descending aorta, unchanged from CT yesterday. 2. Regressed/resolved pulmonary edema since 01/28/2021. Electronically Signed   By: Genevie Ann M.D.   On: 01/31/2021 04:49   CT CHEST WO CONTRAST  Result Date: 01/31/2021 CLINICAL DATA:  Chest pain, pre CABG EXAM: CT CHEST WITHOUT CONTRAST TECHNIQUE: Multidetector CT imaging of the chest was performed following the standard protocol without IV contrast. COMPARISON:  None. FINDINGS: Cardiovascular: Heart is borderline in size. Aorta normal caliber. Diffuse aortic calcifications. Moderate coronary artery and great vessel calcifications. Presumed intra-aortic balloon pump within the aorta with the tip in the mid descending thoracic aorta. Mediastinum/Nodes: No mediastinal, hilar, or axillary adenopathy. Trachea and esophagus are unremarkable. Thyroid unremarkable. Lungs/Pleura: 9 mm nodule in the left apex. Calcified granulomas in the right lung. No effusions. Upper Abdomen: Calcified granulomas in the liver and spleen. Musculoskeletal: Chest wall soft tissues are unremarkable. No acute bony abnormality. IMPRESSION: Coronary artery disease. 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 Old granulomatous disease. 9 mm nodule in the left apex. Consider one of the following in 3 months for both low-risk and high-risk individuals: (a) repeat chest CT, (b) follow-up PET-CT, or (c) tissue sampling. This recommendation follows the consensus statement: Guidelines for Management of Incidental Pulmonary Nodules Detected on CT Images: From the Fleischner Society 2017; Radiology 2017; 284:228-243. Aortic Atherosclerosis (ICD10-I70.0). Electronically Signed   By: Rolm Baptise M.D.   On: 01/31/2021 00:08   CARDIAC CATHETERIZATION  Result Date: 03/23/6383  LV end diastolic pressure is moderately elevated.  There is no aortic  valve stenosis.  Hemodynamic findings consistent with mild pulmonary hypertension. With mildly elevated LVEDP. Normal cardiac output and index by Fick  -------------------------  Prox RCA to Mid RCA lesion is 50% stenosed.  Dist RCA lesion is 99% (subtotal occlusion) stenosed with 90% stenosed side branch in RPAV. Distal RPL fills via left-to-right collaterals  RPDA lesion is 100% stenosed. PDA fills via septal collaterals  Ostial LM 50% stenosis. Dist LM to [facility] LAD  lesion is 60% stenosed with 80% stenosed side branch in Ost Cx.  Mid LAD lesion is 90% stenosed with 40% stenosed side branch in 2nd Diag.  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  CVTS consulted.  Per request, we will check a noncontrasted chest CT  Dr. Gardiner Rhyme has agreed to discussed the case with the patient's daughter who is a cardiac nurse.  With the language barrier this is very difficult discussion having he is extremely scared.  At this point I think his only real option is bypass surgery.  IV heparin was started per pharmacy protocol for IABP. He was hemodynamically stable, chest pain-free and breathing well leave the Cath Lab. Glenetta Hew, MD    Cardiac Studies   Echo 01/29/21: 1. Akinesis of the inferolateral wall; hypokinesis of the inferior wall;  overall moderate LV dysfunction.  2. Left ventricular ejection fraction, by estimation, is 35 to 40%. The  left ventricle has moderately decreased function. The left ventricle   demonstrates regional wall motion abnormalities (see scoring  diagram/findings for description). There is  moderate left ventricular hypertrophy. Left ventricular diastolic  parameters are consistent with Grade I diastolic dysfunction (impaired  relaxation).  3. Right ventricular systolic function is normal. The right ventricular  size is normal.  4. The mitral valve is normal in structure. Trivial mitral valve  regurgitation. No evidence of mitral stenosis.  5. The aortic valve is tricuspid. Aortic valve regurgitation is mild.  Mild aortic valve sclerosis is present, with no evidence of aortic valve  stenosis.   Patient Profile     80 y.o. male with hypertension, T2DM who presents with NSTEMI.  Assessment & Plan    NSTEMI: Presented with acute chest pain, troponin peaked at 16,532.  Echocardiogram showed akinesis of inferolateral wall with hypokinesis of inferior wall, EF 35 to 40%, normal RV function, mild AI.  Cath showed ostial LM 40-50%, distal LM 50-60%, ostial LCX 80%, mid LAD 90%, subtotal occluded distal RCA 99%.  RHC with CI 3.3, PWCP 18-20.  Not a candidate for CABG per surgery or PCI per interventional cardiology, medical management recommended.  Currently chest pain-free.   -Continue aspirin 81 mg daily.  Start plavix as not surgical candidate -Continue atorvastatin 80 mg daily -IABP removed 2/10 -Add carvedilol 3.125 mg BID  Acute combined systolic and diastolic heart failure: Secondary to NSTEMI as above.  LVEF 35 to 40% with inferolateral/inferior wall motion abnormalities.  CI 3.3 on RHC, RA 12, RV 45/5, PA 34/6/20, LVEDP 19 -Hold off on ACE/ARB/Arni given kidney function -Continue BiDil 1 tab 3 times daily -Start carvedilol 3.125 mg BID -PO Lasix 40 mg daily  RLE pain: reports pain in RLE. RLE is warm, 2+ pulses.  Check LE duplex to rule out DVT  Fever: 101.60F last night.  Will check blood cultures, UA/urine culture, CXR  Hematuria: Noted after Foley catheter  attempt yesterday, Foley was placed by urology and started on CBI.  Hematruai resolved, now off CBI  CKD stage IV:  Creatinine stable at 2.3 this morning.  Unclear baseline, but notably creatinine was 2.6 on previous labs from 10 years ago.  Suspect likely this is his baseline CKD  Hyperlipidemia: LDL 129, started on atorvastatin 80 mg daily  T2DM: A1c 8.1%.  On SSI  Stable for transfer out of ICU today  For questions or updates, please contact [facility] Please consult [email or web address] for contact info under        Signed, Donato Heinz, MD  02/01/2021, 8:27 AM

## 2021-02-01 NOTE — Progress Notes (Signed)
PROGRESS NOTE    Randy [name]  HER:[id number] DOB: December 12, 1941 DOA: 01/28/2021 PCP: Jilda Panda, MD    No chief complaint on file.   Brief Narrative:  Randy [name] is a 80 y.o. male with medical history significant of HTN, presents with chest pain and sob.  Meanwhile pt had traumatic foley placement resulting in hematuria on CBI,  Urine remains lightly bloody on Slow CBI gtt. Urology consulted and he underwent coude catheter placement. CBI was removed. Pt was also started on nitro gtt for chest pain IV heparin and cardiology consulted.  Underwent cardiac cath showing Severe Multivessel CAD and Moderately reduced LVEF 35 to 40% by Echocardiogram. He was transferred to ICU and CVTS consulted . An intra aortic balloon pump was placed in the cath lab and removed on 01/31/2021. Pt was not deemed a candidate for CABG due to calcifications of the aortic root and ascending aorta extending into aortic arch and not a candidate for PCI  As per interventional cardiology given his anatomy. Cardiology recommending medical management at this time.       Assessment & Plan:   Principal Problem:   New onset of congestive heart failure (HCC) Active Problems:   Renal insufficiency   Hypertensive crisis   Acute respiratory failure with hypoxia (HCC)   Acute pulmonary edema (HCC)   AKI (acute kidney injury) ([place])   Non-STEMI (non-ST elevated myocardial infarction) ([place])   NSTEMI;  Pt presents with chest pain and sob.  Troponin peaked at 16,532.  Echo showed akinesis of inferolateral wall with hypokinesis of inferior wall , LVEF of 35% to 40 %.  Continue with aspirin, plavix, statin, and bidil.  Cardiac cath today showed multivessel CAD and reduced LVEF 35%.  Pt was not deemed a candidate for CABG due to calcifications of the aortic root and ascending aorta extending into aortic arch, not a candidate for PCI  As per interventional cardiology given his anatomy. Cardiology recommending  medical management at this time.    Acute combined systolic and diastolic heart failure.  Initially diuresed with IV lasix, transitioned to oral lasix.  Weaned off oxygen.  Continue with strict intake and output.     Hematuria from traumatic foley placement resulting in hematuria on CBI, urine remains lightly bloody on Slow CBI gtt. Urology consulted and he underwent coude catheter placement. CBI removed.  flomax on discharge.  Recommend outpatient follow up .   Stage 4 CKD:  Creatinine stable around 2. Continue to monitor.    Febrile overnight, with leukocytosis:  ? Coude catheter placement vs gout.  - UA ordered , showed Moderate leukocytes, with rare bacteria. Urine cultures ordered.  - blood cultures ordered and pending.  - empirically started him on IV rocephin.     Gouty flare up:  In view of his stage 4 CKD will start him on prednisone.  Will get  X rays of the foot.    Hyperlipidemia:  Resume lipitor.       DVT prophylaxis: heparin.  Code Status: (Full code) Family Communication: none at bedside. Will call daughter and update her.  Disposition:   Status is: Inpatient  Remains inpatient appropriate because:Ongoing diagnostic testing needed not appropriate for outpatient work up and Unsafe d/c plan   Dispo: The patient is from: Home              Anticipated d/c is to: Home              Anticipated d/c date is: 2 days  Patient currently is not medically stable to d/c.   Difficult to place patient No       Level of care: Telemetry Cardiac Consultants:   Cardiology.  Cardiothoracic surgery.   Procedures: cardiac cath.    Antimicrobials: none.    Subjective: Alert and answering simple questions.  Objective: Vitals:   02/01/21 1100 02/01/21 1138 02/01/21 1200 02/01/21 1300  BP: 131/68  (!) 103/50 (!) 106/46  Pulse: 75  85 68  Resp: (!) 23  15 14   Temp:  99.6 F (37.6 C)    TempSrc:      SpO2: 96%  93% 97%  Weight:       Height:        Intake/Output Summary (Last 24 hours) at 02/01/2021 1357 Last data filed at 02/01/2021 1200 Gross per 24 hour  Intake 1007.44 ml  Output 1765 ml  Net -757.56 ml   Filed Weights   01/30/21 0119 01/31/21 0500 02/01/21 0900  Weight: 68.6 kg 67.9 kg 66.3 kg    Examination:  General exam: alert and comfortable, weaned off oxygen.  Respiratory system: air entry fair, no tachypnea, no wheezing heard.  Cardiovascular system: S1S2 heard, RRR no JVD no pedal edema.  Gastrointestinal system: Abdomen is soft, non tender non distended, bowel sounds wnl.  Central nervous system: alert and answering simple questions, nonfocal  Extremities: no pedal edema.  Skin: No rashes.  Psychiatry: Mood is appropriate.     Data Reviewed: I have personally reviewed following labs and imaging studies  CBC: Recent Labs  Lab 01/28/21 2322 01/30/21 0442 01/30/21 1335 01/30/21 1341 01/31/21 0106 01/31/21 1819 02/01/21 0151  WBC 15.7* 17.8*  --   --  14.3* 13.5* 15.5*  NEUTROABS 12.3*  --   --   --   --   --   --   HGB 12.1* 11.0* 11.2* 11.2*  11.2* 11.4* 10.5* 10.4*  HCT 37.9* 35.1* 33.0* 33.0*  33.0* 34.1* 32.4* 33.3*  MCV 84.2 83.2  --   --  81.4 82.7 83.7  PLT 501* 493*  --   --  479* 446* 452*    Basic Metabolic Panel: Recent Labs  Lab 01/28/21 2322 01/29/21 0913 01/30/21 0442 01/30/21 1335 01/30/21 1341 01/31/21 0106 02/01/21 0151  NA 137 135 138 138 139  139 137 136  K 3.7 4.9 4.3 4.2 4.1  4.2 4.1 4.0  CL 103 100 104  --   --  104 101  CO2 20* 14* 21*  --   --  19* 23  GLUCOSE 204* 314* 120*  --   --  131* 150*  BUN 30* 33* 48*  --   --  44* 41*  CREATININE 1.75* 2.27* 2.26*  --   --  2.34* 2.32*  CALCIUM 8.3* 8.6* 9.2  --   --  8.7* 8.7*  MG  --   --  1.8  --   --   --   --     GFR: Estimated Creatinine Clearance: 23.4 mL/min (A) (by C-G formula based on SCr of 2.32 mg/dL (H)).  Liver Function Tests: Recent Labs  Lab 01/30/21 0442  AST 57*  ALT 31   ALKPHOS 49  BILITOT 0.6  PROT 6.5  ALBUMIN 3.6    CBG: Recent Labs  Lab 01/31/21 2026 02/01/21 0035 02/01/21 0434 02/01/21 0759 02/01/21 1136  GLUCAP 147* 126* 166* 168* 175*     Recent Results (from the past 240 hour(s))  SARS Coronavirus 2 by RT PCR (hospital  order, performed in [facility] hospital lab) Nasopharyngeal Nasopharyngeal Swab     Status: None   Collection Time: 01/30/21  4:41 AM   Specimen: Nasopharyngeal Swab  Result Value Ref Range Status   SARS Coronavirus 2 NEGATIVE NEGATIVE Final    Comment: (NOTE) SARS-CoV-2 target nucleic acids are NOT DETECTED.  The SARS-CoV-2 RNA is generally detectable in upper and lower respiratory specimens during the acute phase of infection. The lowest concentration of SARS-CoV-2 viral copies this assay can detect is 250 copies / mL. A negative result does not preclude SARS-CoV-2 infection and should not be used as the sole basis for treatment or other patient management decisions.  A negative result may occur with improper specimen collection / handling, submission of specimen other than nasopharyngeal swab, presence of viral mutation(s) within the areas targeted by this assay, and inadequate number of viral copies (<250 copies / mL). A negative result must be combined with clinical observations, patient history, and epidemiological information.  Fact Sheet for Patients:   StrictlyIdeas.no  Fact Sheet for Healthcare Providers: BankingDealers.co.za  This test is not yet approved or  cleared by the [place] FDA and has been authorized for detection and/or diagnosis of SARS-CoV-2 by FDA under an Emergency Use Authorization (EUA).  This EUA will remain in effect (meaning this test can be used) for the duration of the COVID-19 declaration under Section 564(b)(1) of the Act, 21 U.S.C. section 360bbb-3(b)(1), unless the authorization is terminated or revoked  sooner.  Performed at [facility], [place] [address], [place], [place] [address]   Surgical pcr screen     Status: None   Collection Time: 01/30/21  8:34 PM   Specimen: Nasal Mucosa; Nasal Swab  Result Value Ref Range Status   MRSA, PCR NEGATIVE NEGATIVE Final   Staphylococcus aureus NEGATIVE NEGATIVE Final    Comment: (NOTE) The Xpert SA Assay (FDA approved for NASAL specimens in patients 8 years of age and older), is one component of a comprehensive surveillance program. It is not intended to diagnose infection nor to guide or monitor treatment. Performed at [facility], [place] [address] [place], [place] [address]          Radiology Studies: DG Chest 1 View  Result Date: 01/31/2021 CLINICAL DATA:  80 year old male intra-aortic balloon pump. In STEMI. Heart failure. EXAM: CHEST  1 VIEW COMPARISON:  CT chest 01/30/2021 and earlier. FINDINGS: AP chest at 0425 hours. Mildly larger lung volumes from the CT yesterday. Stable cardiac size and mediastinal contours. Marker of intra-aortic balloon pump at the descending thoracic aorta located just below the level of the left mainstem bronchus, unchanged from CT. No pneumothorax. Pulmonary vascularity has improved since 01/28/2021, no overt edema. No pleural effusion or confluent pulmonary opacity. Visualized tracheal air column is within normal limits. Stable visualized osseous structures. IMPRESSION: 1. Intra-aortic balloon pump marker in the descending aorta, unchanged from CT yesterday. 2. Regressed/resolved pulmonary edema since 01/28/2021. Electronically Signed   By: Genevie Ann M.D.   On: 01/31/2021 04:49   CT CHEST WO CONTRAST  Result Date: 01/31/2021 CLINICAL DATA:  Chest pain, pre CABG EXAM: CT CHEST WITHOUT CONTRAST TECHNIQUE: Multidetector CT imaging of the chest was performed following the standard protocol without IV contrast. COMPARISON:  None. FINDINGS: Cardiovascular: Heart is borderline in size. Aorta normal caliber.  Diffuse aortic calcifications. Moderate coronary artery and great vessel calcifications. Presumed intra-aortic balloon pump within the aorta with the tip in the mid descending thoracic aorta. Mediastinum/Nodes:  No mediastinal, hilar, or axillary adenopathy. Trachea and esophagus are unremarkable. Thyroid unremarkable. Lungs/Pleura: 9 mm nodule in the left apex. Calcified granulomas in the right lung. No effusions. Upper Abdomen: Calcified granulomas in the liver and spleen. Musculoskeletal: Chest wall soft tissues are unremarkable. No acute bony abnormality. IMPRESSION: Coronary artery disease. 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 Old granulomatous disease. 9 mm nodule in the left apex. Consider one of the following in 3 months for both low-risk and high-risk individuals: (a) repeat chest CT, (b) follow-up PET-CT, or (c) tissue sampling. This recommendation follows the consensus statement: Guidelines for Management of Incidental Pulmonary Nodules Detected on CT Images: From the Fleischner Society 2017; Radiology 2017; 284:228-243. Aortic Atherosclerosis (ICD10-I70.0). Electronically Signed   By: Rolm Baptise M.D.   On: 01/31/2021 00:08   DG CHEST PORT 1 VIEW  Result Date: 02/01/2021 CLINICAL DATA:  Fever and shortness of breath EXAM: PORTABLE CHEST 1 VIEW COMPARISON:  January 31, 2021 FINDINGS: There is no edema or airspace opacity. Heart is mildly enlarged with pulmonary vascularity normal. Intra-aortic balloon pump no longer evident. There is aortic atherosclerosis. No adenopathy. No bone lesions. IMPRESSION: Intra-aortic balloon pump no longer evident. Stable cardiac prominence. Lungs clear. Aortic Atherosclerosis (ICD10-I70.0). Electronically Signed   By: Lowella Grip III M.D.   On: 02/01/2021 09:21        Scheduled Meds: . aspirin  81 mg Oral Daily  . atorvastatin  80 mg Oral Daily  . carvedilol  3.125 mg Oral BID WC  . Chlorhexidine  Gluconate Cloth  6 each Topical Daily  . clopidogrel  75 mg Oral Daily  . enoxaparin (LOVENOX) injection  30 mg Subcutaneous Q24H  . furosemide  40 mg Oral Daily  . insulin aspart  0-9 Units Subcutaneous Q4H  . isosorbide-hydrALAZINE  1 tablet Oral TID  . predniSONE  40 mg Oral QAC breakfast  . sodium chloride flush  3 mL Intravenous Q12H  . sodium chloride flush  3 mL Intravenous Q12H  . sodium chloride flush  3 mL Intravenous Q12H  . sodium chloride flush  3 mL Intravenous Q12H   Continuous Infusions: . sodium chloride Stopped (02/01/21 0839)  . sodium chloride    . cefTRIAXone (ROCEPHIN)  IV    . sodium chloride irrigation       LOS: 3 days        Hosie Poisson, MD [facility]   To contact the attending provider between 7A-7P or the covering provider during after hours 7P-7A, please log into the web site [email or web address] and access using universal [place] password for that web site. If you do not have the password, please call the hospital operator.  02/01/2021, 1:57 PM

## 2021-02-01 NOTE — Progress Notes (Signed)
Dr. Karleen Hampshire updated by Modena Nunnery RN about pt low BP . New orders received for 250 cc NS bolus   And lab works . Pt conversant, used interpreter tablet to communicate with pt. Pt says  he was having foot pain and wants to sleep it off. SBP increased to high 90's to low 100's. Incoming RN aware.

## 2021-02-02 DIAGNOSIS — I214 Non-ST elevation (NSTEMI) myocardial infarction: Secondary | ICD-10-CM | POA: Diagnosis not present

## 2021-02-02 DIAGNOSIS — R509 Fever, unspecified: Secondary | ICD-10-CM | POA: Diagnosis not present

## 2021-02-02 DIAGNOSIS — J810 Acute pulmonary edema: Secondary | ICD-10-CM

## 2021-02-02 DIAGNOSIS — I509 Heart failure, unspecified: Secondary | ICD-10-CM | POA: Diagnosis not present

## 2021-02-02 LAB — CBC
HCT: 29.9 % — ABNORMAL LOW (ref 39.0–52.0)
Hemoglobin: 9.8 g/dL — ABNORMAL LOW (ref 13.0–17.0)
MCH: 26.8 pg (ref 26.0–34.0)
MCHC: 32.8 g/dL (ref 30.0–36.0)
MCV: 81.7 fL (ref 80.0–100.0)
Platelets: 443 10*3/uL — ABNORMAL HIGH (ref 150–400)
RBC: 3.66 MIL/uL — ABNORMAL LOW (ref 4.22–5.81)
RDW: 13.1 % (ref 11.5–15.5)
WBC: 17.1 10*3/uL — ABNORMAL HIGH (ref 4.0–10.5)
nRBC: 0 % (ref 0.0–0.2)

## 2021-02-02 LAB — GLUCOSE, CAPILLARY
Glucose-Capillary: 129 mg/dL — ABNORMAL HIGH (ref 70–99)
Glucose-Capillary: 162 mg/dL — ABNORMAL HIGH (ref 70–99)
Glucose-Capillary: 175 mg/dL — ABNORMAL HIGH (ref 70–99)
Glucose-Capillary: 242 mg/dL — ABNORMAL HIGH (ref 70–99)
Glucose-Capillary: 244 mg/dL — ABNORMAL HIGH (ref 70–99)
Glucose-Capillary: 267 mg/dL — ABNORMAL HIGH (ref 70–99)

## 2021-02-02 LAB — BASIC METABOLIC PANEL
Anion gap: 13 (ref 5–15)
BUN: 48 mg/dL — ABNORMAL HIGH (ref 8–23)
CO2: 21 mmol/L — ABNORMAL LOW (ref 22–32)
Calcium: 8.4 mg/dL — ABNORMAL LOW (ref 8.9–10.3)
Chloride: 101 mmol/L (ref 98–111)
Creatinine, Ser: 2.56 mg/dL — ABNORMAL HIGH (ref 0.61–1.24)
GFR, Estimated: 24 mL/min — ABNORMAL LOW (ref >60)
Glucose, Bld: 222 mg/dL — ABNORMAL HIGH (ref 70–99)
Potassium: 4.3 mmol/L (ref 3.5–5.1)
Sodium: 135 mmol/L (ref 135–145)

## 2021-02-02 LAB — PROCALCITONIN: Procalcitonin: 0.63 ng/mL

## 2021-02-02 LAB — LACTIC ACID, PLASMA: Lactic Acid, Venous: 1.4 mmol/L (ref 0.5–1.9)

## 2021-02-02 LAB — URINE CULTURE: Culture: NO GROWTH

## 2021-02-02 MED ORDER — INSULIN ASPART 100 UNIT/ML ~~LOC~~ SOLN
0.0000 [IU] | Freq: Every day | SUBCUTANEOUS | Status: DC
  Administered 2021-02-03: 5 [IU] via SUBCUTANEOUS
  Administered 2021-02-04: 4 [IU] via SUBCUTANEOUS
  Administered 2021-02-05: 3 [IU] via SUBCUTANEOUS

## 2021-02-02 MED ORDER — ALUM & MAG HYDROXIDE-SIMETH 200-200-20 MG/5ML PO SUSP: 15.0000 mL | Freq: Four times a day (QID) | ORAL | Status: DC | PRN

## 2021-02-02 MED ORDER — PANTOPRAZOLE SODIUM 40 MG PO TBEC
40.0000 mg | DELAYED_RELEASE_TABLET | Freq: Every day | ORAL | Status: DC
  Administered 2021-02-02 – 2021-02-06 (×5): 40 mg via ORAL
  Filled 2021-02-02 (×5): qty 1

## 2021-02-02 MED ORDER — INSULIN ASPART 100 UNIT/ML ~~LOC~~ SOLN
0.0000 [IU] | Freq: Three times a day (TID) | SUBCUTANEOUS | Status: DC
  Administered 2021-02-02 (×2): 5 [IU] via SUBCUTANEOUS
  Administered 2021-02-03: 2 [IU] via SUBCUTANEOUS
  Administered 2021-02-03: 15 [IU] via SUBCUTANEOUS
  Administered 2021-02-03: 3 [IU] via SUBCUTANEOUS
  Administered 2021-02-04: 5 [IU] via SUBCUTANEOUS
  Administered 2021-02-04: 3 [IU] via SUBCUTANEOUS
  Administered 2021-02-05: 5 [IU] via SUBCUTANEOUS
  Administered 2021-02-05 (×2): 3 [IU] via SUBCUTANEOUS
  Administered 2021-02-06: 8 [IU] via SUBCUTANEOUS
  Administered 2021-02-06: 3 [IU] via SUBCUTANEOUS

## 2021-02-02 MED ORDER — POLYVINYL ALCOHOL 1.4 % OP SOLN
1.0000 [drp] | OPHTHALMIC | Status: DC | PRN
  Administered 2021-02-02 – 2021-02-03 (×3): 1 [drp] via OPHTHALMIC
  Filled 2021-02-02: qty 15

## 2021-02-02 NOTE — Plan of Care (Signed)

## 2021-02-02 NOTE — Progress Notes (Signed)
PROGRESS NOTE    Randy [name]  QJJ:[id number] DOB: 13-Jul-1941 DOA: 01/28/2021 PCP: Jilda Panda, MD    No chief complaint on file.   Brief Narrative:  Randy [name] is a 80 y.o. male with medical history significant of HTN, presents with chest pain and sob.  Meanwhile pt had traumatic foley placement resulting in hematuria on CBI,  Urine remains lightly bloody on Slow CBI gtt. Urology consulted and he underwent coude catheter placement. CBI was removed. Pt was also started on nitro gtt for chest pain IV heparin and cardiology consulted.  Underwent cardiac cath showing Severe Multivessel CAD and Moderately reduced LVEF 35 to 40% by Echocardiogram. He was transferred to ICU and CVTS consulted . An intra aortic balloon pump was placed in the cath lab and removed on 01/31/2021. Pt was not deemed a candidate for CABG due to calcifications of the aortic root and ascending aorta extending into aortic arch and not a candidate for PCI  As per interventional cardiology given his anatomy. Cardiology recommending medical management at this time.    Pt seen and examined, reports some heart burn and bloating.  Foot pain is improving.      Assessment & Plan:   Principal Problem:   New onset of congestive heart failure (HCC) Active Problems:   Renal insufficiency   Hypertensive crisis   Acute respiratory failure with hypoxia (HCC)   Acute pulmonary edema (HCC)   AKI (acute kidney injury) ([place])   Non-STEMI (non-ST elevated myocardial infarction) ([place])   NSTEMI;  Pt presents with chest pain and sob.  Troponin peaked at 16,532.  Echo showed akinesis of inferolateral wall with hypokinesis of inferior wall , LVEF of 35% to 40 %.  Continue with aspirin, plavix, statin, and bidil.  Cardiac cath today showed multivessel CAD and reduced LVEF 35%.  Pt was not deemed a candidate for CABG due to calcifications of the aortic root and ascending aorta extending into aortic arch, not a candidate  for PCI  As per interventional cardiology given his anatomy. Cardiology recommending medical management at this time. Today he denies any chest pain or sob.     Acute combined systolic and diastolic heart failure.  Initially diuresed with IV lasix, transitioned to oral lasix 40 mg daily.  Weaned off oxygen.  Continue with strict intake and output.  He appears euvolemic.     Hematuria from traumatic foley placement resulting in hematuria on CBI, urine remains lightly bloody on Slow CBI gtt. Urology consulted and he underwent coude catheter placement. CBI removed.  flomax on discharge.  Recommend outpatient follow up  With urology.   Stage 4 CKD:  Creatinine around 2.56.     Fever with leukocytosis:  ? Coude catheter placement vs gout.  - UA ordered , showed Moderate leukocytes, with rare bacteria. Urine cultures ordered and pending.  - blood cultures ordered and pending.  - empirically started him on IV rocephin.     Gouty flare up:  In view of his stage 4 CKD will start him on prednisone. Pain is improving.   x ray of the right foot is negative.    Hyperlipidemia:  Resume lipitor.    Lung nodule  CT of the chest shows 9 mm nodule in the left apex. Recommend repeat CT in about 3 months time for further evaluation  PT/OT evaluation ordered.   DVT prophylaxis: heparin.  Code Status: (Full code) Family Communication: None at bedside.discssed with daughter on 02/01/21 Disposition:   Status is: Inpatient  Remains inpatient appropriate because:Ongoing diagnostic testing needed not appropriate for outpatient work up and Unsafe d/c plan   Dispo: The patient is from: Home              Anticipated d/c is to: Home              Anticipated d/c date is: 2 days              Patient currently is not medically stable to d/c.   Difficult to place patient No       Level of care: Progressive Consultants:   Cardiology.  Cardiothoracic surgery.   Procedures: cardiac  cath.    Antimicrobials: none.    Subjective: Pt reports some heart burn. No chest pain , no nausea, vomiting or abdominal pain.   Objective: Vitals:   02/02/21 1048 02/02/21 1118 02/02/21 1148 02/02/21 1620  BP: (!) 123/52 (!) 131/49 (!) 104/46 (!) 136/57  Pulse:    74  Resp:      Temp:    (!) 97.5 F (36.4 C)  TempSrc:    Oral  SpO2: 98% 96% 97%   Weight:      Height:        Intake/Output Summary (Last 24 hours) at 02/02/2021 1632 Last data filed at 02/02/2021 1623 Gross per 24 hour  Intake 340 ml  Output 875 ml  Net -535 ml   Filed Weights   01/31/21 0500 02/01/21 0900 02/02/21 0448  Weight: 67.9 kg 66.3 kg 68 kg    Examination:  General exam: Alert and comfortable, not in distress Respiratory system: Air entry fair, no wheezing or rhonchi Cardiovascular system: S1-S2 heard, regular rate rhythm, no pedal edema Gastrointestinal system: Abdomen is soft, nontender bowel sounds normal Central nervous system: Alert and answering questions appropriately, grossly nonfocal Extremities: No pedal edema Skin: No rashes.  Psychiatry: Mood is appropriate    Data Reviewed: I have personally reviewed following labs and imaging studies  CBC: Recent Labs  Lab 01/28/21 2322 01/30/21 0442 01/30/21 1335 01/30/21 1341 01/31/21 0106 01/31/21 1819 02/01/21 0151 02/02/21 0130  WBC 15.7* 17.8*  --   --  14.3* 13.5* 15.5* 17.1*  NEUTROABS 12.3*  --   --   --   --   --   --   --   HGB 12.1* 11.0*   < > 11.2*  11.2* 11.4* 10.5* 10.4* 9.8*  HCT 37.9* 35.1*   < > 33.0*  33.0* 34.1* 32.4* 33.3* 29.9*  MCV 84.2 83.2  --   --  81.4 82.7 83.7 81.7  PLT 501* 493*  --   --  479* 446* 452* 443*   < > = values in this interval not displayed.    Basic Metabolic Panel: Recent Labs  Lab 01/29/21 0913 01/30/21 0442 01/30/21 1335 01/30/21 1341 01/31/21 0106 02/01/21 0151 02/02/21 0130  NA 135 138 138 139  139 137 136 135  K 4.9 4.3 4.2 4.1  4.2 4.1 4.0 4.3  CL 100 104  --    --  104 101 101  CO2 14* 21*  --   --  19* 23 21*  GLUCOSE 314* 120*  --   --  131* 150* 222*  BUN 33* 48*  --   --  44* 41* 48*  CREATININE 2.27* 2.26*  --   --  2.34* 2.32* 2.56*  CALCIUM 8.6* 9.2  --   --  8.7* 8.7* 8.4*  MG  --  1.8  --   --   --   --   --  GFR: Estimated Creatinine Clearance: 21.8 mL/min (A) (by C-G formula based on SCr of 2.56 mg/dL (H)).  Liver Function Tests: Recent Labs  Lab 01/30/21 0442  AST 57*  ALT 31  ALKPHOS 49  BILITOT 0.6  PROT 6.5  ALBUMIN 3.6    CBG: Recent Labs  Lab 02/01/21 2115 02/02/21 0001 02/02/21 0440 02/02/21 0751 02/02/21 1210  GLUCAP 290* 267* 162* 129* 242*     Recent Results (from the past 240 hour(s))  SARS Coronavirus 2 by RT PCR (hospital order, performed in [facility] hospital lab) Nasopharyngeal Nasopharyngeal Swab     Status: None   Collection Time: 01/30/21  4:41 AM   Specimen: Nasopharyngeal Swab  Result Value Ref Range Status   SARS Coronavirus 2 NEGATIVE NEGATIVE Final    Comment: (NOTE) SARS-CoV-2 target nucleic acids are NOT DETECTED.  The SARS-CoV-2 RNA is generally detectable in upper and lower respiratory specimens during the acute phase of infection. The lowest concentration of SARS-CoV-2 viral copies this assay can detect is 250 copies / mL. A negative result does not preclude SARS-CoV-2 infection and should not be used as the sole basis for treatment or other patient management decisions.  A negative result may occur with improper specimen collection / handling, submission of specimen other than nasopharyngeal swab, presence of viral mutation(s) within the areas targeted by this assay, and inadequate number of viral copies (<250 copies / mL). A negative result must be combined with clinical observations, patient history, and epidemiological information.  Fact Sheet for Patients:   StrictlyIdeas.no  Fact Sheet for Healthcare  Providers: BankingDealers.co.za  This test is not yet approved or  cleared by the [place] FDA and has been authorized for detection and/or diagnosis of SARS-CoV-2 by FDA under an Emergency Use Authorization (EUA).  This EUA will remain in effect (meaning this test can be used) for the duration of the COVID-19 declaration under Section 564(b)(1) of the Act, 21 U.S.C. section 360bbb-3(b)(1), unless the authorization is terminated or revoked sooner.  Performed at [facility], [place] [address], [place], [place] [address]   Surgical pcr screen     Status: None   Collection Time: 01/30/21  8:34 PM   Specimen: Nasal Mucosa; Nasal Swab  Result Value Ref Range Status   MRSA, PCR NEGATIVE NEGATIVE Final   Staphylococcus aureus NEGATIVE NEGATIVE Final    Comment: (NOTE) The Xpert SA Assay (FDA approved for NASAL specimens in patients 78 years of age and older), is one component of a comprehensive surveillance program. It is not intended to diagnose infection nor to guide or monitor treatment. Performed at [facility], [place] [address] [place], [place] [address]   Urine Culture     Status: None   Collection Time: 02/01/21  9:02 AM   Specimen: Urine, Random  Result Value Ref Range Status   Specimen Description URINE, RANDOM  Final   Special Requests NONE  Final   Culture   Final    NO GROWTH Performed at [facility], [place] [address], [place],  [address]    Report Status 02/02/2021 FINAL  Final         Radiology Studies: DG CHEST PORT 1 VIEW  Result Date: 02/01/2021 CLINICAL DATA:  Fever and shortness of breath EXAM: PORTABLE CHEST 1 VIEW COMPARISON:  January 31, 2021 FINDINGS: There is no edema or airspace opacity. Heart is mildly enlarged with pulmonary vascularity normal. Intra-aortic balloon pump no longer evident. There is aortic atherosclerosis.  No adenopathy. No bone lesions. IMPRESSION: Intra-aortic balloon pump no longer  evident. Stable cardiac prominence. Lungs clear. Aortic Atherosclerosis (ICD10-I70.0). Electronically Signed   By: Lowella Grip III M.D.   On: 02/01/2021 09:21   DG Foot Complete Right  Result Date: 02/01/2021 CLINICAL DATA:  Right foot pain. EXAM: RIGHT FOOT COMPLETE - 3+ VIEW COMPARISON:  None. FINDINGS: There is no evidence of fracture or dislocation. There is no evidence of arthropathy or other focal bone abnormality. Soft tissues are unremarkable. IMPRESSION: Negative. Electronically Signed   By: Marijo Conception M.D.   On: 02/01/2021 14:55   VAS [place] LOWER EXTREMITY VENOUS (DVT)  Result Date: 02/01/2021  Lower Venous DVT Study Indications: Pain in back of right leg.  Comparison Study: No prior. Performing Technologist: Oda Cogan RDMS, RVT  Examination Guidelines: A complete evaluation includes B-mode imaging, spectral Doppler, color Doppler, and power Doppler as needed of all accessible portions of each vessel. Bilateral testing is considered an integral part of a complete examination. Limited examinations for reoccurring indications may be performed as noted. The reflux portion of the exam is performed with the patient in reverse Trendelenburg.  +---------+---------------+---------+-----------+----------+--------------+ RIGHT    CompressibilityPhasicitySpontaneityPropertiesThrombus Aging +---------+---------------+---------+-----------+----------+--------------+ CFV      Full           Yes                                          +---------+---------------+---------+-----------+----------+--------------+ SFJ      Full                    Yes                                 +---------+---------------+---------+-----------+----------+--------------+ FV Prox  Full                                                        +---------+---------------+---------+-----------+----------+--------------+ FV Mid   Full                                                         +---------+---------------+---------+-----------+----------+--------------+ FV DistalFull                                                        +---------+---------------+---------+-----------+----------+--------------+ PFV      Full                                                        +---------+---------------+---------+-----------+----------+--------------+ POP      Full           Yes      Yes                                 +---------+---------------+---------+-----------+----------+--------------+  PTV      Full                                                        +---------+---------------+---------+-----------+----------+--------------+ PERO     Full                                                        +---------+---------------+---------+-----------+----------+--------------+   +----+---------------+---------+-----------+----------+--------------+ LEFTCompressibilityPhasicitySpontaneityPropertiesThrombus Aging +----+---------------+---------+-----------+----------+--------------+ CFV Full           Yes      Yes                                 +----+---------------+---------+-----------+----------+--------------+ SFJ Full                                                        +----+---------------+---------+-----------+----------+--------------+     Summary: RIGHT: - There is no evidence of deep vein thrombosis in the lower extremity.  - No cystic structure found in the popliteal fossa.  LEFT: - No evidence of common femoral vein obstruction.  *See table(s) above for measurements and observations.    Preliminary         Scheduled Meds: . aspirin  81 mg Oral Daily  . atorvastatin  80 mg Oral Daily  . carvedilol  3.125 mg Oral BID WC  . Chlorhexidine Gluconate Cloth  6 each Topical Daily  . clopidogrel  75 mg Oral Daily  . enoxaparin (LOVENOX) injection  30 mg Subcutaneous Q24H  . insulin aspart  0-15 Units Subcutaneous TID WC  .  insulin aspart  0-5 Units Subcutaneous QHS  . isosorbide-hydrALAZINE  1 tablet Oral TID  . pantoprazole  40 mg Oral Q0600  . predniSONE  40 mg Oral QAC breakfast  . sodium chloride flush  3 mL Intravenous Q12H   Continuous Infusions: . sodium chloride Stopped (02/01/21 0839)  . sodium chloride    . cefTRIAXone (ROCEPHIN)  IV 1 g (02/01/21 1551)     LOS: 4 days        Hosie Poisson, MD [facility]   To contact the attending provider between 7A-7P or the covering provider during after hours 7P-7A, please log into the web site [email or web address] and access using universal [place] password for that web site. If you do not have the password, please call the hospital operator.  02/02/2021, 4:32 PM

## 2021-02-03 DIAGNOSIS — J810 Acute pulmonary edema: Secondary | ICD-10-CM | POA: Diagnosis not present

## 2021-02-03 DIAGNOSIS — R509 Fever, unspecified: Secondary | ICD-10-CM | POA: Diagnosis not present

## 2021-02-03 DIAGNOSIS — I509 Heart failure, unspecified: Secondary | ICD-10-CM | POA: Diagnosis not present

## 2021-02-03 LAB — GLUCOSE, CAPILLARY
Glucose-Capillary: 139 mg/dL — ABNORMAL HIGH (ref 70–99)
Glucose-Capillary: 166 mg/dL — ABNORMAL HIGH (ref 70–99)
Glucose-Capillary: 366 mg/dL — ABNORMAL HIGH (ref 70–99)
Glucose-Capillary: 206 mg/dL — ABNORMAL HIGH (ref 70–99)

## 2021-02-03 LAB — BASIC METABOLIC PANEL
Anion gap: 12 (ref 5–15)
BUN: 55 mg/dL — ABNORMAL HIGH (ref 8–23)
CO2: 21 mmol/L — ABNORMAL LOW (ref 22–32)
Calcium: 8.2 mg/dL — ABNORMAL LOW (ref 8.9–10.3)
Chloride: 104 mmol/L (ref 98–111)
Creatinine, Ser: 2.47 mg/dL — ABNORMAL HIGH (ref 0.61–1.24)
GFR, Estimated: 26 mL/min — ABNORMAL LOW (ref >60)
Glucose, Bld: 196 mg/dL — ABNORMAL HIGH (ref 70–99)
Potassium: 4.2 mmol/L (ref 3.5–5.1)
Sodium: 137 mmol/L (ref 135–145)

## 2021-02-03 LAB — PROCALCITONIN: Procalcitonin: 0.29 ng/mL

## 2021-02-03 MED ORDER — PREDNISONE 20 MG PO TABS
40.0000 mg | ORAL_TABLET | Freq: Every day | ORAL | Status: AC
  Administered 2021-02-04 – 2021-02-05 (×2): 40 mg via ORAL
  Filled 2021-02-03 (×2): qty 2

## 2021-02-03 NOTE — TOC Initial Note (Signed)
Transition of Care [facility]) - Initial/Assessment Note    Patient Details  Name: Randy [name] MRN: [id number] Date of Birth: 10/08/1941  [facility] CM/SW Contact:    Bethena Roys, RN Phone Number: 02/03/2021, 3:13 PM  Clinical Narrative:  Patient presented for new onset CHF. Prior to arrival patient lives at the [facility]. Case Manager was able to meet the daughter Joelyn Oms in the room to discuss plan of care. Daughter works Monday-Wednesday this week and she is aware that patient may be ready by Tuesday to transition home. Daughter will see if family can assist with transportation. Family wants patient in an [place] (ALF) in the future. Plan for now will be for the patient [facility] and then the children will discuss getting their father to an ALF. A clinical social worker from [place] can assist with navigating family regarding ALF. Referral provided to [facility] and start of care to begin within 24-48 hours post transition home. Durable medical equipment rolling walker to be delivered to the room prior to transition home. Case Manager will continue to follow for additional toc needs.            Expected Discharge Plan: [place] Barriers to Discharge: Continued Medical Work up   Patient Goals and CMS Choice Patient states their goals for this hospitalization and ongoing recovery are:: to return to [facility]   Choice offered to / list presented to : NA (daughter did not need list she use to work for [employer]- wanted to use them)  Expected Discharge Plan and Services Expected Discharge Plan: [place] In-house Referral: NA Discharge Planning Services: CM Consult Post Acute Care Choice: [place] Living arrangements for the past 2 months: Single Family Home                 DME Arranged: Walker  rolling DME Agency: [employer] Date DME Agency Contacted: 02/03/21 Time DME Agency Contacted: 1500 Representative spoke with at DME Agency: Freda Munro HH Arranged: RN,Disease Management,PT,Social Work [place] Agency: [place] Date [place]: 02/03/21 Time [place]: [place] Representative spoke with at [place]: [place] Arrangements/Services Living arrangements for the past 2 months: [place] with:: Other (Comment) ([place]- with other [place]) Patient language and need for interpreter reviewed:: Yes (Daughter in the room to interpret) Do you feel safe going back to the place where you live?: Yes      Need for Family Participation in Patient Care: Yes (Comment) Care giver support system in place?: Yes (comment)   Criminal Activity/Legal Involvement Pertinent to Current Situation/Hospitalization: No - Comment as needed  Activities of Daily Living Home Assistive Devices/Equipment: Eyeglasses ADL Screening (condition at time of admission) Patient's cognitive ability adequate to safely complete daily activities?: Yes Is the patient deaf or have difficulty hearing?: No Does the patient have difficulty seeing, even when wearing glasses/contacts?: No Does the patient have difficulty concentrating, remembering, or making decisions?: No Patient able to express need for assistance with ADLs?: Yes Does the patient have difficulty dressing or bathing?: No Independently performs ADLs?: Yes (appropriate for developmental age) Does the patient have difficulty walking or climbing stairs?: No Weakness of Legs: None Weakness of Arms/Hands: None  Permission Sought/Granted Permission sought to share information with : Family Estate agent Permission granted to share information with :  Yes, Verbal Permission Granted     Permission granted to share info w AGENCY: [facility], [employer]         Emotional Assessment Appearance:: Appears stated age Attitude/Demeanor/Rapport: Engaged Affect (typically observed): Appropriate Orientation: : Oriented to Self,Oriented to Place,Oriented to  Time,Fluctuating Orientation (Suspected and/or reported Sundowners),Oriented to Situation Alcohol / Substance Use: Not Applicable Psych Involvement: No (comment)  Admission diagnosis:  Hypertensive crisis [I16.9] Acute pulmonary edema (HCC) [J81.0] Respiratory distress [R06.03] AKI (acute kidney injury) ([place]) [N17.9] New onset of congestive heart failure ([place]) [I50.9] Patient Active Problem List   Diagnosis Date Noted  . Acute pulmonary edema (HCC)   . AKI (acute kidney injury) ([place])   . Non-STEMI (non-ST elevated myocardial infarction) ([place])   . Renal insufficiency 01/29/2021  . Hypertensive crisis 01/29/2021  . New onset of congestive heart failure ([place]) 01/29/2021  . Acute respiratory failure with hypoxia ([place]) 01/29/2021   PCP:  Jilda Panda, MD Pharmacy:   [facility] [place] [address] Phone: [phone] Fax: [phone]   Readmission Risk Interventions No flowsheet data found.

## 2021-02-03 NOTE — Progress Notes (Signed)
Progress Note  Patient Name: Randy [name] Date of Encounter: 02/03/2021  [facility] Cardiologist: Donato Heinz, MD   Subjective   Patient feels well today; no chest pain or SOB. Hoping to go home soon No events overnight; Cr down-trending to 2.47 No weight recorded today  Inpatient Medications    Scheduled Meds: . aspirin  81 mg Oral Daily  . atorvastatin  80 mg Oral Daily  . carvedilol  3.125 mg Oral BID WC  . Chlorhexidine Gluconate Cloth  6 each Topical Daily  . clopidogrel  75 mg Oral Daily  . enoxaparin (LOVENOX) injection  30 mg Subcutaneous Q24H  . insulin aspart  0-15 Units Subcutaneous TID WC  . insulin aspart  0-5 Units Subcutaneous QHS  . isosorbide-hydrALAZINE  1 tablet Oral TID  . pantoprazole  40 mg Oral Q0600  . predniSONE  40 mg Oral QAC breakfast  . sodium chloride flush  3 mL Intravenous Q12H   Continuous Infusions: . sodium chloride Stopped (02/01/21 0839)  . sodium chloride    . cefTRIAXone (ROCEPHIN)  IV 1 g (02/02/21 1801)   PRN Meds: sodium chloride, acetaminophen, alum & mag hydroxide-simeth, nitroGLYCERIN, ondansetron (ZOFRAN) IV, polyvinyl alcohol, sodium chloride flush   Vital Signs    Vitals:   02/02/21 1048 02/02/21 1118 02/02/21 1148 02/02/21 1620  BP: (!) 123/52 (!) 131/49 (!) 104/46 (!) 136/57  Pulse:    74  Resp:      Temp:    (!) 97.5 F (36.4 C)  TempSrc:    Oral  SpO2: 98% 96% 97%   Weight:      Height:        Intake/Output Summary (Last 24 hours) at 02/03/2021 0754 Last data filed at 02/02/2021 1623 Gross per 24 hour  Intake --  Output 400 ml  Net -400 ml   Last 3 Weights 02/02/2021 02/01/2021 01/31/2021  Weight (lbs) 149 lb 14.6 oz 146 lb 2.6 oz 149 lb 11.1 oz  Weight (kg) 68 kg 66.3 kg 67.9 kg      Telemetry    NSR - Personally Reviewed  ECG    No new tracing - Personally Reviewed  Physical Exam   GEN: No acute distress.   Neck: No JVD Cardiac: RRR, no murmurs, rubs, or gallops.   Respiratory: Clear to auscultation bilaterally. GI: Soft, nontender, non-distended  MS: No edema; No deformity. Neuro:  Nonfocal  Psych: Normal affect   Labs    High Sensitivity Troponin:   Recent Labs  Lab 01/29/21 0725 01/29/21 0913 01/29/21 1210 01/29/21 1700 01/29/21 1945  TROPONINIHS 16,150* 14,097* 16,532* 14,215* 15,900*      Chemistry Recent Labs  Lab 01/30/21 0442 01/30/21 1335 02/01/21 0151 02/02/21 0130 02/03/21 0236  NA 138   < > 136 135 137  K 4.3   < > 4.0 4.3 4.2  CL 104   < > 101 101 104  CO2 21*   < > 23 21* 21*  GLUCOSE 120*   < > 150* 222* 196*  BUN 48*   < > 41* 48* 55*  CREATININE 2.26*   < > 2.32* 2.56* 2.47*  CALCIUM 9.2   < > 8.7* 8.4* 8.2*  PROT 6.5  --   --   --   --   ALBUMIN 3.6  --   --   --   --   AST 57*  --   --   --   --   ALT 31  --   --   --   --  ALKPHOS 49  --   --   --   --   BILITOT 0.6  --   --   --   --   GFRNONAA 28*   < > 28* 24* 26*  ANIONGAP 13   < > 12 13 12    < > = values in this interval not displayed.     Hematology Recent Labs  Lab 01/31/21 1819 02/01/21 0151 02/02/21 0130  WBC 13.5* 15.5* 17.1*  RBC 3.92* 3.98* 3.66*  HGB 10.5* 10.4* 9.8*  HCT 32.4* 33.3* 29.9*  MCV 82.7 83.7 81.7  MCH 26.8 26.1 26.8  MCHC 32.4 31.2 32.8  RDW 13.3 13.2 13.1  PLT 446* 452* 443*    BNP Recent Labs  Lab 01/28/21 2322  BNP 643.2*     DDimer No results for input(s): DDIMER in the last 168 hours.   Radiology    DG CHEST PORT 1 VIEW  Result Date: 02/01/2021 CLINICAL DATA:  Fever and shortness of breath EXAM: PORTABLE CHEST 1 VIEW COMPARISON:  January 31, 2021 FINDINGS: There is no edema or airspace opacity. Heart is mildly enlarged with pulmonary vascularity normal. Intra-aortic balloon pump no longer evident. There is aortic atherosclerosis. No adenopathy. No bone lesions. IMPRESSION: Intra-aortic balloon pump no longer evident. Stable cardiac prominence. Lungs clear. Aortic Atherosclerosis (ICD10-I70.0).  Electronically Signed   By: Lowella Grip III M.D.   On: 02/01/2021 09:21   DG Foot Complete Right  Result Date: 02/01/2021 CLINICAL DATA:  Right foot pain. EXAM: RIGHT FOOT COMPLETE - 3+ VIEW COMPARISON:  None. FINDINGS: There is no evidence of fracture or dislocation. There is no evidence of arthropathy or other focal bone abnormality. Soft tissues are unremarkable. IMPRESSION: Negative. Electronically Signed   By: Marijo Conception M.D.   On: 02/01/2021 14:55   VAS [place] LOWER EXTREMITY VENOUS (DVT)  Result Date: 02/02/2021  Lower Venous DVT Study Indications: Pain in back of right leg.  Comparison Study: No prior. Performing Technologist: Oda Cogan RDMS, RVT  Examination Guidelines: A complete evaluation includes B-mode imaging, spectral Doppler, color Doppler, and power Doppler as needed of all accessible portions of each vessel. Bilateral testing is considered an integral part of a complete examination. Limited examinations for reoccurring indications may be performed as noted. The reflux portion of the exam is performed with the patient in reverse Trendelenburg.  +---------+---------------+---------+-----------+----------+--------------+ RIGHT    CompressibilityPhasicitySpontaneityPropertiesThrombus Aging +---------+---------------+---------+-----------+----------+--------------+ CFV      Full           Yes                                          +---------+---------------+---------+-----------+----------+--------------+ SFJ      Full                    Yes                                 +---------+---------------+---------+-----------+----------+--------------+ FV Prox  Full                                                        +---------+---------------+---------+-----------+----------+--------------+ FV Mid   Full                                                        +---------+---------------+---------+-----------+----------+--------------+  FV  DistalFull                                                        +---------+---------------+---------+-----------+----------+--------------+ PFV      Full                                                        +---------+---------------+---------+-----------+----------+--------------+ POP      Full           Yes      Yes                                 +---------+---------------+---------+-----------+----------+--------------+ PTV      Full                                                        +---------+---------------+---------+-----------+----------+--------------+ PERO     Full                                                        +---------+---------------+---------+-----------+----------+--------------+   +----+---------------+---------+-----------+----------+--------------+ LEFTCompressibilityPhasicitySpontaneityPropertiesThrombus Aging +----+---------------+---------+-----------+----------+--------------+ CFV Full           Yes      Yes                                 +----+---------------+---------+-----------+----------+--------------+ SFJ Full                                                        +----+---------------+---------+-----------+----------+--------------+     Summary: RIGHT: - There is no evidence of deep vein thrombosis in the lower extremity.  - No cystic structure found in the popliteal fossa.  LEFT: - No evidence of common femoral vein obstruction.  *See table(s) above for measurements and observations. Electronically signed by Harold Barban MD on 02/02/2021 at 7:59:15 PM.    Final     Cardiac Studies   Echo 01/29/21: 1. Akinesis of the inferolateral wall; hypokinesis of the inferior wall;  overall moderate LV dysfunction.  2. Left ventricular ejection fraction, by estimation, is 35 to 40%. The  left ventricle has moderately decreased function. The left ventricle  demonstrates regional wall motion abnormalities (see scoring   diagram/findings for description). There is  moderate left ventricular hypertrophy. Left ventricular diastolic  parameters are consistent with Grade I diastolic dysfunction (impaired  relaxation).  3. Right ventricular systolic function is normal. The right ventricular  size is normal.  4. The mitral valve is normal in structure. Trivial mitral valve  regurgitation. No evidence of mitral stenosis.  5. The aortic valve is tricuspid. Aortic valve regurgitation is mild.  Mild aortic valve sclerosis is present, with no evidence of aortic valve  stenosis.   Cath 01/27/70:  LV end diastolic pressure is moderately elevated.  There is no aortic valve stenosis.  Hemodynamic findings consistent with mild pulmonary hypertension. With mildly elevated LVEDP. Normal cardiac output and index by Fick  -------------------------  Prox RCA to Mid RCA lesion is 50% stenosed.  Dist RCA lesion is 99% (subtotal occlusion) stenosed with 90% stenosed side branch in RPAV. Distal RPL fills via left-to-right collaterals  RPDA lesion is 100% stenosed. PDA fills via septal collaterals  Ostial LM 50% stenosis. Dist LM to Ost LAD lesion is 60% stenosed with 80% stenosed side branch in Ost Cx.  Mid LAD lesion is 90% stenosed with 40% stenosed side branch in 2nd Diag.  SUMMARY  Severe Multivessel CAD:  ? Ostial LM 40 to 50%, distal LM 50 to 60% involving ostial LCx 80;   mid LAD 90% (at major septal and diagonal branches);  ? 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 CVTS consulted.  Per request, we will check a noncontrasted chest CT  Dr. Gardiner Rhyme has agreed  to discussed the case with the patient's daughter who is a cardiac nurse. With the language barrier this is very difficult discussion having he is extremely scared. At this point I think his only real option is bypass surgery.  IV heparin was started per pharmacy protocol for IABP.   Patient Profile     80 y.o. male with history of HTN and DMII who presented with NSTEMI found to have severe multivessel CAD deemed not to be a surgical or PCI candidate now on medical management.  Assessment & Plan    #NSTEMI #Multivessel CAD: Patient presented with acute chest pain, troponin peaked at 16,532. TTE showed akinesis of inferolateral wall with hypokinesis of inferior wall, EF 35 to 40%, normal RV function, mild AI. Cath demonstrated ostial LM 40-50%, distal LM 50-60%, ostial LCX 80%, mid LAD 90%, subtotal occluded distal RCA 99%. RHC with CI 3.3, PWCP 18-20. Not a candidate for CABG per surgery or PCI per interventional cardiology, now on medical management. Currently chest pain-free.  -Continue aspirin 81 mg daily -Continue plavix 75mg  daily -Plan for 1 year DAPT due to NSTEMI -Continue atorvastatin 80 mg daily -IABP removed 2/10 -Continue coreg 3.125mg  BID  #Acute combined systolic and diastolic heart failure:  Secondary to NSTEMI as above. LVEF 35 to 40% with inferolateral/inferior wall motion abnormalities. CI 3.3 on RHC, RA 12, RV 45/5, PA 34/6/20, LVEDP 19 -Hold off on ACE/ARB/Arni given kidney function -ContinueBiDil 1 tab 3 times daily -Continue carvedilol 3.125 mg BID as above -Hold lasix again today--resume tomorrow pending continued improvement in renal function; likely reduced dose of 20mg  daily -Monitor I/Os and daily weights -Low Na diet  #RLE pain:  Suspected gout flare. -Started on prednisone per primary -No evidence of DVT on doppler -Xray of foot without acute pathology -Continue symptomatic management  #Fever:  -Cultures pending -On empiric ABX per  IM  #Hematuria:  Noted after Foley catheter attempt yesterday, Foley was placed by urology and started  on CBI. Hematruai resolved, now off CBI -Continue to monitor  #CKDstage IV:  Creatinine up slightly to 2.5 this morning.  -Hold lasix today due to episode of hypotension -Likely resume tomorrow at reduced dose 20mg  daily  #Hyperlipidemia:  LDL 129. 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  -On SSI  Hopefully home tomorrow. Will resume lasix 20mg  in the AM tomorrow as maintenance diuretic pending renal function.    For questions or updates, please contact Cecilia Please consult [email or web address] for contact info under        Signed, Freada Bergeron, MD  02/03/2021, 7:54 AM

## 2021-02-03 NOTE — Plan of Care (Signed)
  Problem: Clinical Measurements: Goal: Ability to maintain clinical measurements within normal limits will improve Outcome: Progressing   

## 2021-02-03 NOTE — Evaluation (Signed)
Physical Therapy Evaluation Patient Details Name: Randy [name] MRN: [id number] DOB: [birth year] Today's Date: 02/03/2021   History of Present Illness  80 y.o. 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 Deemed not to be a surgical or PCI candidate now on medical management. Pt also with RLE pain and suspected gout flare. Xray of foot without acute pathology.  Clinical Impression  Prior to admission, pt states he lives with his "pastor" (unsure of accuracy) and is independent. Pt presents with balance deficits and gait abnormalities. Pt ambulating x 150 feet with a walker at a min guard assist level. Improved stability with walker vs without. Recommended use of walker for all mobility. Will continue to follow acutely to progress mobility as tolerated.     Follow Up Recommendations Home health PT;Supervision for mobility/OOB    Equipment Recommendations  Rolling walker with 5" wheels    Recommendations for Other Services       Precautions / Restrictions Precautions Precautions: Fall Restrictions Weight Bearing Restrictions: No      Mobility  Bed Mobility               General bed mobility comments: Sitting EOB upon arrival    Transfers Overall transfer level: Needs assistance Equipment used: None Transfers: Sit to/from Stand Sit to Stand: Min guard         General transfer comment: Min guard to rise from edge of bed  Ambulation/Gait Ambulation/Gait assistance: Min assist;Min guard Gait Distance (Feet): 150 Feet Assistive device: Rolling walker (2 wheeled);None Gait Pattern/deviations: Step-through pattern;Decreased stride length;Antalgic;Decreased weight shift to right Gait velocity: decreased   General Gait Details: Pt initially requiring minA for balance with heavily antalgic gait pattern. Progressed to min guard assist with use of walker. Cues provided for walker proximity and segmental  turning  Stairs            Wheelchair Mobility    Modified Rankin (Stroke Patients Only)       Balance Overall balance assessment: Needs assistance Sitting-balance support: Feet supported Sitting balance-Leahy Scale: Normal     Standing balance support: No upper extremity supported;During functional activity Standing balance-Leahy Scale: Poor                               Pertinent Vitals/Pain Pain Assessment: Faces Faces Pain Scale: Hurts a little bit Pain Location: R foot Pain Descriptors / Indicators: Guarding Pain Intervention(s): Monitored during session    Home Living Family/patient expects to be discharged to:: Private residence Living Arrangements: Non-relatives/Friends Available Help at Discharge: Family (daughters) Type of Home: House Home Access: Stairs to enter   Technical brewer of Steps: 6 Home Layout: One level Home Equipment: None      Prior Function Level of Independence: Independent         Comments: Denies falls     Hand Dominance        Extremity/Trunk Assessment   Upper Extremity Assessment Upper Extremity Assessment: Defer to OT evaluation    Lower Extremity Assessment Lower Extremity Assessment: Overall WFL for tasks assessed    Cervical / Trunk Assessment Cervical / Trunk Assessment: Normal  Communication   Communication: Prefers language other than [place] ([place]; Solicitor utilized)  Cognition Arousal/Alertness: Awake/alert Behavior During Therapy: WFL for tasks assessed/performed Overall Cognitive Status: Difficult to assess  General Comments: Pt seems to be perseverating on needing to call his daughter to obtain his belongings. Difficult to assess cognition due to non English speaking      General Comments  VSS    Exercises     Assessment/Plan    PT Assessment Patient needs continued PT services  PT Problem List  Decreased strength;Decreased activity tolerance;Decreased balance;Decreased mobility;Pain       PT Treatment Interventions DME instruction;Gait training;Stair training;Functional mobility training;Therapeutic activities;Balance training;Therapeutic exercise;Patient/family education    PT Goals (Current goals can be found in the Care Plan section)  Acute Rehab PT Goals Patient Stated Goal: go home PT Goal Formulation: With patient Time For Goal Achievement: 02/17/21 Potential to Achieve Goals: Good    Frequency Min 3X/week   Barriers to discharge        Co-evaluation               AM-PAC PT "6 Clicks" Mobility  Outcome Measure Help needed turning from your back to your side while in a flat bed without using bedrails?: None Help needed moving from lying on your back to sitting on the side of a flat bed without using bedrails?: None Help needed moving to and from a bed to a chair (including a wheelchair)?: A Little Help needed standing up from a chair using your arms (e.g., wheelchair or bedside chair)?: A Little Help needed to walk in hospital room?: A Little Help needed climbing 3-5 steps with a railing? : A Little 6 Click Score: 20    End of Session Equipment Utilized During Treatment: Gait belt Activity Tolerance: Patient tolerated treatment well Patient left: in bed;with call bell/phone within reach;with bed alarm set;Other (comment) (with OT) Nurse Communication: Mobility status PT Visit Diagnosis: Unsteadiness on feet (R26.81);Pain Pain - Right/Left: Right Pain - part of body: Ankle and joints of foot    Time: 0902-0925 PT Time Calculation (min) (ACUTE ONLY): 23 min   Charges:   PT Evaluation $PT Eval Moderate Complexity: 1 Mod PT Treatments $Gait Training: 8-22 mins        Wyona Almas, PT, DPT Acute Rehabilitation Services Pager [phone] Office [phone]   Deno Etienne 02/03/2021, 11:02 AM

## 2021-02-03 NOTE — Progress Notes (Signed)
PROGRESS NOTE    Randy [name]  YSA:[id number] DOB: Jul 24, 1941 DOA: 01/28/2021 PCP: Jilda Panda, MD    No chief complaint on file.   Brief Narrative:  Randy [name] is a 80 y.o. male with medical history significant of HTN, presents with chest pain and sob.  Meanwhile pt had traumatic foley placement resulting in hematuria on CBI,  Urine remains lightly bloody on Slow CBI gtt. Urology consulted and he underwent coude catheter placement. CBI was removed. Pt was also started on nitro gtt for chest pain IV heparin and cardiology consulted.  Underwent cardiac cath showing Severe Multivessel CAD and Moderately reduced LVEF 35 to 40% by Echocardiogram. He was transferred to ICU and CVTS consulted . An intra aortic balloon pump was placed in the cath lab and removed on 01/31/2021. Pt was not deemed a candidate for CABG due to calcifications of the aortic root and ascending aorta extending into aortic arch and not a candidate for PCI  As per interventional cardiology given his anatomy. Cardiology recommending medical management at this time.    Pt seen and examined , reports that he walked with PT and was able to walk in the hallway.  Therapy evaluations recommending home health PT.       Assessment & Plan:   Principal Problem:   New onset of congestive heart failure (HCC) Active Problems:   Renal insufficiency   Hypertensive crisis   Acute respiratory failure with hypoxia (HCC)   Acute pulmonary edema (HCC)   AKI (acute kidney injury) ([place])   Non-STEMI (non-ST elevated myocardial infarction) ([place])   NSTEMI;  Pt presents with chest pain and sob.  Troponin peaked at 16,532.  Echo showed akinesis of inferolateral wall with hypokinesis of inferior wall , LVEF of 35% to 40 %.  Continue with aspirin, plavix, statin, and bidil.  Cardiac cath today showed multivessel CAD and reduced LVEF 35%.  Pt was not deemed a candidate for CABG due to calcifications of the aortic root and  ascending aorta extending into aortic arch, not a candidate for PCI  As per interventional cardiology given his anatomy. Cardiology recommending medical management at this time. Today he denies any chest pain or sob.     Acute combined systolic and diastolic heart failure.  Initially diuresed with IV lasix, transitioned to oral lasix 40 mg daily, which has been held for hypotension. Plan to start him on low dose lasix tomorrow.  Weaned off oxygen.  Continue with strict intake and output.  He appears euvolemic.     Hematuria from traumatic foley placement resulting in hematuria on CBI, urine remains lightly bloody on Slow CBI gtt. Urology consulted and he underwent coude catheter placement. CBI removed.  flomax on discharge.  Recommend outpatient follow up  With urology. Will probably discharge him on coude catheter.   Stage 4 CKD:  Creatinine around 2.56.     Fever with leukocytosis:  ? Coude catheter placement vs gout.  - UA ordered , showed Moderate leukocytes, with rare bacteria. Urine cultures negative.   - blood cultures ordered, pending, and negative.  - empirically started him on IV rocephin, transitioned to oral keflex to complete the course.     Gouty flare up:  In view of his stage 4 CKD, will start him on prednisone. Pain is improving.   x ray of the right foot is negative. He was able to tolerate the walking and weight on his feet.    Hyperlipidemia:  Resume lipitor.    Lung nodule  CT of the chest shows 9 mm nodule in the left apex. Recommend repeat CT in about 3 months time for further evaluation  PT/OT evaluation ordered recommending home health PT.    DVT prophylaxis: heparin.  Code Status: (Full code) Family Communication: None at bedside.discssed with daughter on 02/01/21 Disposition:   Status is: Inpatient  Remains inpatient appropriate because:Ongoing diagnostic testing needed not appropriate for outpatient work up and Unsafe d/c  plan   Dispo: The patient is from: Home              Anticipated d/c is to: Home              Anticipated d/c date is: 2 days              Patient currently is not medically stable to d/c.   Difficult to place patient No       Level of care: Progressive Consultants:   Cardiology.  Cardiothoracic surgery.   Procedures: cardiac cath.    Antimicrobials: none.    Subjective: REPROTS HEART BURN resolved.  No chest pain or sob. No nausea or vomiting.   Objective: Vitals:   02/02/21 1048 02/02/21 1118 02/02/21 1148 02/02/21 1620  BP: (!) 123/52 (!) 131/49 (!) 104/46 (!) 136/57  Pulse:    74  Resp:      Temp:    (!) 97.5 F (36.4 C)  TempSrc:    Oral  SpO2: 98% 96% 97%   Weight:      Height:        Intake/Output Summary (Last 24 hours) at 02/03/2021 1416 Last data filed at 02/03/2021 1000 Gross per 24 hour  Intake 240 ml  Output 1750 ml  Net -1510 ml   Filed Weights   01/31/21 0500 02/01/21 0900 02/02/21 0448  Weight: 67.9 kg 66.3 kg 68 kg    Examination:  General exam: Alert and comfortable, not in any distress Respiratory system: Clear to auscultation bilaterally, no wheezing or rhonchi Cardiovascular system: S1-S2 heard, regular rate rhythm, no JVD, no pedal edema Gastrointestinal system: Abdomen is soft nontender, nondistended, bowel sounds normal Central nervous system: Alert and answering questions appropriately Extremities: No pedal edema Skin: No rashes seen Psychiatry: Mood is appropriate    Data Reviewed: I have personally reviewed following labs and imaging studies  CBC: Recent Labs  Lab 01/28/21 2322 01/30/21 0442 01/30/21 1335 01/30/21 1341 01/31/21 0106 01/31/21 1819 02/01/21 0151 02/02/21 0130  WBC 15.7* 17.8*  --   --  14.3* 13.5* 15.5* 17.1*  NEUTROABS 12.3*  --   --   --   --   --   --   --   HGB 12.1* 11.0*   < > 11.2*  11.2* 11.4* 10.5* 10.4* 9.8*  HCT 37.9* 35.1*   < > 33.0*  33.0* 34.1* 32.4* 33.3* 29.9*  MCV 84.2 83.2   --   --  81.4 82.7 83.7 81.7  PLT 501* 493*  --   --  479* 446* 452* 443*   < > = values in this interval not displayed.    Basic Metabolic Panel: Recent Labs  Lab 01/30/21 0442 01/30/21 1335 01/30/21 1341 01/31/21 0106 02/01/21 0151 02/02/21 0130 02/03/21 0236  NA 138   < > 139  139 137 136 135 137  K 4.3   < > 4.1  4.2 4.1 4.0 4.3 4.2  CL 104  --   --  104 101 101 104  CO2 21*  --   --  19* 23 21* 21*  GLUCOSE 120*  --   --  131* 150* 222* 196*  BUN 48*  --   --  44* 41* 48* 55*  CREATININE 2.26*  --   --  2.34* 2.32* 2.56* 2.47*  CALCIUM 9.2  --   --  8.7* 8.7* 8.4* 8.2*  MG 1.8  --   --   --   --   --   --    < > = values in this interval not displayed.    GFR: Estimated Creatinine Clearance: 22.6 mL/min (A) (by C-G formula based on SCr of 2.47 mg/dL (H)).  Liver Function Tests: Recent Labs  Lab 01/30/21 0442  AST 57*  ALT 31  ALKPHOS 49  BILITOT 0.6  PROT 6.5  ALBUMIN 3.6    CBG: Recent Labs  Lab 02/02/21 1210 02/02/21 1616 02/02/21 2129 02/03/21 0757 02/03/21 1200  GLUCAP 242* 244* 175* 139* 166*     Recent Results (from the past 240 hour(s))  SARS Coronavirus 2 by RT PCR (hospital order, performed in [facility] hospital lab) Nasopharyngeal Nasopharyngeal Swab     Status: None   Collection Time: 01/30/21  4:41 AM   Specimen: Nasopharyngeal Swab  Result Value Ref Range Status   SARS Coronavirus 2 NEGATIVE NEGATIVE Final    Comment: (NOTE) SARS-CoV-2 target nucleic acids are NOT DETECTED.  The SARS-CoV-2 RNA is generally detectable in upper and lower respiratory specimens during the acute phase of infection. The lowest concentration of SARS-CoV-2 viral copies this assay can detect is 250 copies / mL. A negative result does not preclude SARS-CoV-2 infection and should not be used as the sole basis for treatment or other patient management decisions.  A negative result may occur with improper specimen collection / handling, submission of  specimen other than nasopharyngeal swab, presence of viral mutation(s) within the areas targeted by this assay, and inadequate number of viral copies (<250 copies / mL). A negative result must be combined with clinical observations, patient history, and epidemiological information.  Fact Sheet for Patients:   StrictlyIdeas.no  Fact Sheet for Healthcare Providers: BankingDealers.co.za  This test is not yet approved or  cleared by the [place] FDA and has been authorized for detection and/or diagnosis of SARS-CoV-2 by FDA under an Emergency Use Authorization (EUA).  This EUA will remain in effect (meaning this test can be used) for the duration of the COVID-19 declaration under Section 564(b)(1) of the Act, 21 U.S.C. section 360bbb-3(b)(1), unless the authorization is terminated or revoked sooner.  Performed at [facility], [place] [address] [place], [place] [address]   Surgical pcr screen     Status: None   Collection Time: 01/30/21  8:34 PM   Specimen: Nasal Mucosa; Nasal Swab  Result Value Ref Range Status   MRSA, PCR NEGATIVE NEGATIVE Final   Staphylococcus aureus NEGATIVE NEGATIVE Final    Comment: (NOTE) The Xpert SA Assay (FDA approved for NASAL specimens in patients 1 years of age and older), is one component of a comprehensive surveillance program. It is not intended to diagnose infection nor to guide or monitor treatment. Performed at [facility], [place] [address] [place], [place] [address]   Urine Culture     Status: None   Collection Time: 02/01/21  9:02 AM   Specimen: Urine, Random  Result Value Ref Range Status   Specimen Description URINE, RANDOM  Final   Special Requests NONE  Final   Culture  Final    NO GROWTH Performed at [facility], [place] [address], [place], [place] [address]    Report Status 02/02/2021 FINAL  Final  Culture, blood (Routine X 2) w Reflex to ID Panel     Status:  None (Preliminary result)   Collection Time: 02/01/21  9:53 AM   Specimen: BLOOD  Result Value Ref Range Status   Specimen Description BLOOD RIGHT ANTECUBITAL  Final   Special Requests   Final    BOTTLES DRAWN AEROBIC ONLY Blood Culture results may not be optimal due to an inadequate volume of blood received in culture bottles   Culture   Final    NO GROWTH 1 DAY Performed at [facility], [place] [address], [place], [place] [address]    Report Status PENDING  Incomplete         Radiology Studies: DG Foot Complete Right  Result Date: 02/01/2021 CLINICAL DATA:  Right foot pain. EXAM: RIGHT FOOT COMPLETE - 3+ VIEW COMPARISON:  None. FINDINGS: There is no evidence of fracture or dislocation. There is no evidence of arthropathy or other focal bone abnormality. Soft tissues are unremarkable. IMPRESSION: Negative. Electronically Signed   By: Marijo Conception M.D.   On: 02/01/2021 14:55   VAS [place] LOWER EXTREMITY VENOUS (DVT)  Result Date: 02/02/2021  Lower Venous DVT Study Indications: Pain in back of right leg.  Comparison Study: No prior. Performing Technologist: Oda Cogan RDMS, RVT  Examination Guidelines: A complete evaluation includes B-mode imaging, spectral Doppler, color Doppler, and power Doppler as needed of all accessible portions of each vessel. Bilateral testing is considered an integral part of a complete examination. Limited examinations for reoccurring indications may be performed as noted. The reflux portion of the exam is performed with the patient in reverse Trendelenburg.  +---------+---------------+---------+-----------+----------+--------------+ RIGHT    CompressibilityPhasicitySpontaneityPropertiesThrombus Aging +---------+---------------+---------+-----------+----------+--------------+ CFV      Full           Yes                                          +---------+---------------+---------+-----------+----------+--------------+ SFJ      Full                     Yes                                 +---------+---------------+---------+-----------+----------+--------------+ FV Prox  Full                                                        +---------+---------------+---------+-----------+----------+--------------+ FV Mid   Full                                                        +---------+---------------+---------+-----------+----------+--------------+ FV DistalFull                                                        +---------+---------------+---------+-----------+----------+--------------+  PFV      Full                                                        +---------+---------------+---------+-----------+----------+--------------+ POP      Full           Yes      Yes                                 +---------+---------------+---------+-----------+----------+--------------+ PTV      Full                                                        +---------+---------------+---------+-----------+----------+--------------+ PERO     Full                                                        +---------+---------------+---------+-----------+----------+--------------+   +----+---------------+---------+-----------+----------+--------------+ LEFTCompressibilityPhasicitySpontaneityPropertiesThrombus Aging +----+---------------+---------+-----------+----------+--------------+ CFV Full           Yes      Yes                                 +----+---------------+---------+-----------+----------+--------------+ SFJ Full                                                        +----+---------------+---------+-----------+----------+--------------+     Summary: RIGHT: - There is no evidence of deep vein thrombosis in the lower extremity.  - No cystic structure found in the popliteal fossa.  LEFT: - No evidence of common femoral vein obstruction.  *See table(s) above for measurements and observations.  Electronically signed by Harold Barban MD on 02/02/2021 at 7:59:15 PM.    Final         Scheduled Meds: . aspirin  81 mg Oral Daily  . atorvastatin  80 mg Oral Daily  . carvedilol  3.125 mg Oral BID WC  . Chlorhexidine Gluconate Cloth  6 each Topical Daily  . clopidogrel  75 mg Oral Daily  . enoxaparin (LOVENOX) injection  30 mg Subcutaneous Q24H  . insulin aspart  0-15 Units Subcutaneous TID WC  . insulin aspart  0-5 Units Subcutaneous QHS  . isosorbide-hydrALAZINE  1 tablet Oral TID  . pantoprazole  40 mg Oral Q0600  . sodium chloride flush  3 mL Intravenous Q12H   Continuous Infusions: . sodium chloride Stopped (02/01/21 0839)  . sodium chloride    . cefTRIAXone (ROCEPHIN)  IV 1 g (02/02/21 1801)     LOS: 5 days        Hosie Poisson, MD [facility]   To contact the attending provider between 7A-7P or the covering provider during after hours 7P-7A, please log into the web site [email or web address]  and access using universal [place] password for that web site. If you do not have the password, please call the hospital operator.  02/03/2021, 2:16 PM

## 2021-02-03 NOTE — Evaluation (Signed)
Occupational Therapy Evaluation Patient Details Name: Randy [name] MRN: [id number] DOB: [birth year] Today's Date: 02/03/2021    History of Present Illness 80 y.o. 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 Deemed not to be a surgical or PCI candidate now on medical management. Pt also with RLE pain and suspected gout flare. Xray of foot without acute pathology.   Clinical Impression   PTA, pt reports he was living with his pastor, and reports he was independent with ADL/IADL and functional mobility. Unsure accuracy of pt's report. Pt currently requires minguard assistance at [facility] level for completion of grooming while standing and sink level. Pt demonstrates mild impulsivity and requires cues for safety with mobility. Due to decline in current level of function, pt would benefit from acute OT to address established goals to facilitate safe D/C to venue listed below. At this time, recommend HHOT follow-up. Will continue to follow acutely.     Follow Up Recommendations  Home health OT;Supervision - Intermittent (with mobility)    Equipment Recommendations  None recommended by OT    Recommendations for Other Services       Precautions / Restrictions Precautions Precautions: Fall Restrictions Weight Bearing Restrictions: No      Mobility Bed Mobility               General bed mobility comments: Sitting EOB upon arrival    Transfers Overall transfer level: Needs assistance Equipment used: None Transfers: Sit to/from Stand Sit to Stand: Min guard         General transfer comment: Min guard to rise from edge of bed and cues for proper body positioning with return to recliner    Balance Overall balance assessment: Needs assistance Sitting-balance support: Feet supported Sitting balance-Leahy Scale: Normal     Standing balance support: No upper extremity supported;During functional activity Standing  balance-Leahy Scale: Fair Standing balance comment: completed oral care and washed face while standing at sink, unable to tolerate much challenge with standing balance                           ADL either performed or assessed with clinical judgement   ADL Overall ADL's : Needs assistance/impaired Eating/Feeding: Set up;Sitting   Grooming: Min guard;Standing Grooming Details (indicate cue type and reason): completed oral care while standing at sink level Upper Body Bathing: Set up;Sitting   Lower Body Bathing: Min guard;Sit to/from stand   Upper Body Dressing : Set up;Sitting   Lower Body Dressing: Min guard;Sit to/from stand Lower Body Dressing Details (indicate cue type and reason): pt able to figure-4 Toilet Transfer: Min Marine scientist Details (indicate cue type and reason): cues for safe use of DME;pt impulsive at times, cues for safety Toileting- Clothing Manipulation and Hygiene: Min guard;Sit to/from stand       Functional mobility during ADLs: Min guard;Rolling walker General ADL Comments: pt impulsive at times, cues for safe use of DME and safety with mobiltiy     Vision         Perception     Praxis      Pertinent Vitals/Pain Pain Assessment: No/denies pain Faces Pain Scale: Hurts a little bit Pain Location: R foot Pain Descriptors / Indicators: Guarding Pain Intervention(s): Monitored during session     Hand Dominance Right   Extremity/Trunk Assessment Upper Extremity Assessment Upper Extremity Assessment: Generalized weakness;Overall [facility] for tasks assessed   Lower Extremity Assessment  Lower Extremity Assessment: Overall WFL for tasks assessed;Defer to PT evaluation   Cervical / Trunk Assessment Cervical / Trunk Assessment: Normal   Communication Communication Communication: Prefers language other than [place] ([place]; Solicitor utilized)   Cognition Arousal/Alertness:  Awake/alert Behavior During Therapy: WFL for tasks assessed/performed Overall Cognitive Status: Difficult to assess                                 General Comments: Pt seems to be perseverating on needing to call his daughter to obtain his belongings. Difficult to assess cognition due to non English speaking;oriented x4   General Comments  VSS on RA    Exercises     Shoulder Instructions      Home Living Family/patient expects to be discharged to:: Private residence Living Arrangements: Non-relatives/Friends Available Help at Discharge: Family (daughters) Type of Home: House Home Access: Stairs to enter Technical brewer of Steps: 6   Home Layout: One level     Bathroom Shower/Tub: Chief Strategy Officer: None          Prior Functioning/Environment Level of Independence: Independent        Comments: Denies falls        OT Problem List: Decreased activity tolerance;Impaired balance (sitting and/or standing);Decreased safety awareness      OT Treatment/Interventions: Self-care/ADL training;Therapeutic exercise;Energy conservation;DME and/or AE instruction;Therapeutic activities;Patient/family education;Balance training    OT Goals(Current goals can be found in the care plan section) Acute Rehab OT Goals Patient Stated Goal: go home OT Goal Formulation: With patient Time For Goal Achievement: 02/17/21 Potential to Achieve Goals: Good ADL Goals Pt Will Perform Grooming: with modified independence;standing Pt Will Perform Lower Body Dressing: with modified independence;sit to/from stand Pt Will Transfer to Toilet: with modified independence;ambulating Additional ADL Goal #1: Pt will demonstrate independence with 3 energy conservation strategies during ADL and functional mobility.  OT Frequency: Min 2X/week   Barriers to D/C:            Co-evaluation              AM-PAC OT "6 Clicks" Daily Activity     Outcome  Measure Help from another person eating meals?: A Little Help from another person taking care of personal grooming?: A Little Help from another person toileting, which includes using toliet, bedpan, or urinal?: A Little Help from another person bathing (including washing, rinsing, drying)?: A Little Help from another person to put on and taking off regular upper body clothing?: A Little Help from another person to put on and taking off regular lower body clothing?: A Little 6 Click Score: 18   End of Session Equipment Utilized During Treatment: Gait belt Nurse Communication: Mobility status  Activity Tolerance: Patient tolerated treatment well Patient left: in chair;with call bell/phone within reach;with chair alarm set  OT Visit Diagnosis: Other abnormalities of gait and mobility (R26.89);Muscle weakness (generalized) (M62.81)                Time: 5056-9794 OT Time Calculation (min): 20 min Charges:  OT General Charges $OT Visit: 1 Visit OT Evaluation $OT Eval Moderate Complexity: [place] OTR/L Acute Rehabilitation Services Office: [place] 02/03/2021, 12:34 PM

## 2021-02-04 DIAGNOSIS — I509 Heart failure, unspecified: Secondary | ICD-10-CM | POA: Diagnosis not present

## 2021-02-04 DIAGNOSIS — J810 Acute pulmonary edema: Secondary | ICD-10-CM | POA: Diagnosis not present

## 2021-02-04 DIAGNOSIS — R509 Fever, unspecified: Secondary | ICD-10-CM | POA: Diagnosis not present

## 2021-02-04 LAB — CBC WITH DIFFERENTIAL/PLATELET
Abs Immature Granulocytes: 0.17 10*3/uL — ABNORMAL HIGH (ref 0.00–0.07)
Basophils Absolute: 0 10*3/uL (ref 0.0–0.1)
Basophils Relative: 0 %
Eosinophils Absolute: 0 10*3/uL (ref 0.0–0.5)
Eosinophils Relative: 0 %
HCT: 34.6 % — ABNORMAL LOW (ref 39.0–52.0)
Hemoglobin: 10.7 g/dL — ABNORMAL LOW (ref 13.0–17.0)
Immature Granulocytes: 1 %
Lymphocytes Relative: 9 %
Lymphs Abs: 1.3 10*3/uL (ref 0.7–4.0)
MCH: 26 pg (ref 26.0–34.0)
MCHC: 30.9 g/dL (ref 30.0–36.0)
MCV: 84 fL (ref 80.0–100.0)
Monocytes Absolute: 1.3 10*3/uL — ABNORMAL HIGH (ref 0.1–1.0)
Monocytes Relative: 9 %
Neutro Abs: 11.9 10*3/uL — ABNORMAL HIGH (ref 1.7–7.7)
Neutrophils Relative %: 81 %
Platelets: 604 10*3/uL — ABNORMAL HIGH (ref 150–400)
RBC: 4.12 MIL/uL — ABNORMAL LOW (ref 4.22–5.81)
RDW: 13.2 % (ref 11.5–15.5)
WBC: 14.8 10*3/uL — ABNORMAL HIGH (ref 4.0–10.5)
nRBC: 0 % (ref 0.0–0.2)

## 2021-02-04 LAB — GLUCOSE, CAPILLARY
Glucose-Capillary: 120 mg/dL — ABNORMAL HIGH (ref 70–99)
Glucose-Capillary: 190 mg/dL — ABNORMAL HIGH (ref 70–99)
Glucose-Capillary: 240 mg/dL — ABNORMAL HIGH (ref 70–99)
Glucose-Capillary: 335 mg/dL — ABNORMAL HIGH (ref 70–99)

## 2021-02-04 LAB — BASIC METABOLIC PANEL
Anion gap: 11 (ref 5–15)
BUN: 50 mg/dL — ABNORMAL HIGH (ref 8–23)
CO2: 22 mmol/L (ref 22–32)
Calcium: 8.3 mg/dL — ABNORMAL LOW (ref 8.9–10.3)
Chloride: 102 mmol/L (ref 98–111)
Creatinine, Ser: 2.29 mg/dL — ABNORMAL HIGH (ref 0.61–1.24)
GFR, Estimated: 28 mL/min — ABNORMAL LOW (ref >60)
Glucose, Bld: 222 mg/dL — ABNORMAL HIGH (ref 70–99)
Potassium: 4 mmol/L (ref 3.5–5.1)
Sodium: 135 mmol/L (ref 135–145)

## 2021-02-04 MED ORDER — TAMSULOSIN HCL 0.4 MG PO CAPS
0.4000 mg | ORAL_CAPSULE | Freq: Every day | ORAL | Status: DC
  Administered 2021-02-04 – 2021-02-05 (×2): 0.4 mg via ORAL
  Filled 2021-02-04 (×2): qty 1

## 2021-02-04 NOTE — Progress Notes (Signed)
PROGRESS NOTE    Randy [name]  ONG:[id number] DOB: 02/02/1941 DOA: 01/28/2021 PCP: Jilda Panda, MD    No chief complaint on file.   Brief Narrative:  Randy [name] is a 80 y.o. male with medical history significant of HTN, presents with chest pain and sob.  Meanwhile pt had traumatic foley placement resulting in hematuria on CBI,  Urine remains lightly bloody on Slow CBI gtt. Urology consulted and he underwent coude catheter placement. CBI was removed. Pt was also started on nitro gtt for chest pain IV heparin and cardiology consulted.  Underwent cardiac cath showing Severe Multivessel CAD and Moderately reduced LVEF 35 to 40% by Echocardiogram. He was transferred to ICU and CVTS consulted . An intra aortic balloon pump was placed in the cath lab and removed on 01/31/2021. Pt was not deemed a candidate for CABG due to calcifications of the aortic root and ascending aorta extending into aortic arch and not a candidate for PCI  As per interventional cardiology given his anatomy. Cardiology recommending medical management at this time.    Pt seen and examined , no new complaints today. Plan for voiding trial today. D/c coude catheter Therapy evaluations recommending home health PT.       Assessment & Plan:   Principal Problem:   New onset of congestive heart failure (HCC) Active Problems:   Renal insufficiency   Hypertensive crisis   Acute respiratory failure with hypoxia (HCC)   Acute pulmonary edema (HCC)   AKI (acute kidney injury) ([place])   Non-STEMI (non-ST elevated myocardial infarction) ([place])   NSTEMI;  Pt presents with chest pain and sob.  Troponin peaked at 16,532.  Echo showed akinesis of inferolateral wall with hypokinesis of inferior wall , LVEF of 35% to 40 %.  Continue with aspirin, plavix, statin, and bidil.  Cardiac cath today showed multivessel CAD and reduced LVEF 35%.  Pt was not deemed a candidate for CABG due to calcifications of the aortic root and  ascending aorta extending into aortic arch, not a candidate for PCI  As per interventional cardiology given his anatomy. Cardiology recommending medical management at this time. No new complaints at this time.     Acute combined systolic and diastolic heart failure.  Initially diuresed with IV lasix, transitioned to oral lasix 40 mg daily, which has been held for hypotension. His BP parameters improved, and he was started on low dose lasix 20 mg daily.  Weaned off oxygen.  Continue with strict intake and output.  He appears euvolemic.     Hematuria from traumatic foley placement resulting in hematuria on CBI, urine remains lightly bloody on Slow CBI gtt. Urology consulted and he underwent coude catheter placement. CBI removed.  flomax on discharge.  Recommend outpatient follow up  With urology.  Will d/c coude catheter today and do voiding trial.  Will start him on flomax.   Stage 4 CKD:  Creatinine around 2.2.    Fever with leukocytosis:  ? Coude catheter placement vs gout.  - UA ordered , showed Moderate leukocytes, with rare bacteria. Urine cultures negative.   - blood cultures ordered, pending, and negative.  - empirically started him on IV rocephin, transitioned to oral keflex to complete the course.     Gouty flare up:  In view of his stage 4 CKD, will start him on prednisone. Pain is improving. D/c prednisone after tomorrow's dose.    x ray of the right foot is negative. He was able to tolerate the walking and weight  on his feet.    Hyperlipidemia:  Resume lipitor.    Lung nodule  CT of the chest shows 9 mm nodule in the left apex. Recommend repeat CT in about 3 months time for further evaluation  PT/OT evaluation ordered recommending home health PT.    DVT prophylaxis: heparin.  Code Status: (Full code) Family Communication: None at bedside.discssed with daughter on 02/03/21 Disposition:   Status is: Inpatient  Remains inpatient appropriate because:Ongoing  diagnostic testing needed not appropriate for outpatient work up and Unsafe d/c plan   Dispo: The patient is from: Home              Anticipated d/c is to: Home              Anticipated d/c date is: 1 day              Patient currently is not medically stable to d/c.   Difficult to place patient No       Level of care: Progressive Consultants:   Cardiology.  Cardiothoracic surgery.   Procedures: cardiac cath.    Antimicrobials: none.    Subjective:  No new complaints.  Objective: Vitals:   02/02/21 1620 02/03/21 2100 02/04/21 0345 02/04/21 0837  BP: (!) 136/57 (!) 125/50 135/74 (!) 136/55  Pulse: 74   69  Resp:  18 18   Temp: (!) 97.5 F (36.4 C) 97.7 F (36.5 C) 97.9 F (36.6 C)   TempSrc: Oral Oral Oral   SpO2:   98%   Weight:      Height:        Intake/Output Summary (Last 24 hours) at 02/04/2021 1333 Last data filed at 02/04/2021 0600 Gross per 24 hour  Intake 120 ml  Output 1050 ml  Net -930 ml   Filed Weights   01/31/21 0500 02/01/21 0900 02/02/21 0448  Weight: 67.9 kg 66.3 kg 68 kg    Examination:  General exam: alert , not in distress.  Respiratory system: Air entry fair, no wheezing heard.  Cardiovascular system: S1-S2 heard, RRR no PEDAL EDEMA.  Gastrointestinal system: Abdomen is soft, NT ND BS+ Central nervous system: Alert answering questions appropriately.  Extremities: right foot pain improved.  Skin: No rashes seen.  Psychiatry: Mood is appropriate.     Data Reviewed: I have personally reviewed following labs and imaging studies  CBC: Recent Labs  Lab 01/28/21 2322 01/30/21 0442 01/30/21 1335 01/30/21 1341 01/31/21 0106 01/31/21 1819 02/01/21 0151 02/02/21 0130  WBC 15.7* 17.8*  --   --  14.3* 13.5* 15.5* 17.1*  NEUTROABS 12.3*  --   --   --   --   --   --   --   HGB 12.1* 11.0*   < > 11.2*  11.2* 11.4* 10.5* 10.4* 9.8*  HCT 37.9* 35.1*   < > 33.0*  33.0* 34.1* 32.4* 33.3* 29.9*  MCV 84.2 83.2  --   --  81.4 82.7  83.7 81.7  PLT 501* 493*  --   --  479* 446* 452* 443*   < > = values in this interval not displayed.    Basic Metabolic Panel: Recent Labs  Lab 01/30/21 0442 01/30/21 1335 01/31/21 0106 02/01/21 0151 02/02/21 0130 02/03/21 0236 02/04/21 0958  NA 138   < > 137 136 135 137 135  K 4.3   < > 4.1 4.0 4.3 4.2 4.0  CL 104  --  104 101 101 104 102  CO2 21*  --  19*  23 21* 21* 22  GLUCOSE 120*  --  131* 150* 222* 196* 222*  BUN 48*  --  44* 41* 48* 55* 50*  CREATININE 2.26*  --  2.34* 2.32* 2.56* 2.47* 2.29*  CALCIUM 9.2  --  8.7* 8.7* 8.4* 8.2* 8.3*  MG 1.8  --   --   --   --   --   --    < > = values in this interval not displayed.    GFR: Estimated Creatinine Clearance: 24.3 mL/min (A) (by C-G formula based on SCr of 2.29 mg/dL (H)).  Liver Function Tests: Recent Labs  Lab 01/30/21 0442  AST 57*  ALT 31  ALKPHOS 49  BILITOT 0.6  PROT 6.5  ALBUMIN 3.6    CBG: Recent Labs  Lab 02/03/21 1200 02/03/21 1634 02/03/21 2013 02/04/21 0759 02/04/21 1102  GLUCAP 166* 366* 206* 120* 190*     Recent Results (from the past 240 hour(s))  SARS Coronavirus 2 by RT PCR (hospital order, performed in [facility] hospital lab) Nasopharyngeal Nasopharyngeal Swab     Status: None   Collection Time: 01/30/21  4:41 AM   Specimen: Nasopharyngeal Swab  Result Value Ref Range Status   SARS Coronavirus 2 NEGATIVE NEGATIVE Final    Comment: (NOTE) SARS-CoV-2 target nucleic acids are NOT DETECTED.  The SARS-CoV-2 RNA is generally detectable in upper and lower respiratory specimens during the acute phase of infection. The lowest concentration of SARS-CoV-2 viral copies this assay can detect is 250 copies / mL. A negative result does not preclude SARS-CoV-2 infection and should not be used as the sole basis for treatment or other patient management decisions.  A negative result may occur with improper specimen collection / handling, submission of specimen other than nasopharyngeal  swab, presence of viral mutation(s) within the areas targeted by this assay, and inadequate number of viral copies (<250 copies / mL). A negative result must be combined with clinical observations, patient history, and epidemiological information.  Fact Sheet for Patients:   StrictlyIdeas.no  Fact Sheet for Healthcare Providers: BankingDealers.co.za  This test is not yet approved or  cleared by the [place] FDA and has been authorized for detection and/or diagnosis of SARS-CoV-2 by FDA under an Emergency Use Authorization (EUA).  This EUA will remain in effect (meaning this test can be used) for the duration of the COVID-19 declaration under Section 564(b)(1) of the Act, 21 U.S.C. section 360bbb-3(b)(1), unless the authorization is terminated or revoked sooner.  Performed at [facility], [place] [address] [place], [place] [address]   Surgical pcr screen     Status: None   Collection Time: 01/30/21  8:34 PM   Specimen: Nasal Mucosa; Nasal Swab  Result Value Ref Range Status   MRSA, PCR NEGATIVE NEGATIVE Final   Staphylococcus aureus NEGATIVE NEGATIVE Final    Comment: (NOTE) The Xpert SA Assay (FDA approved for NASAL specimens in patients 45 years of age and older), is one component of a comprehensive surveillance program. It is not intended to diagnose infection nor to guide or monitor treatment. Performed at [facility], [place] [address] [place], [place] [address]   Urine Culture     Status: None   Collection Time: 02/01/21  9:02 AM   Specimen: Urine, Random  Result Value Ref Range Status   Specimen Description URINE, RANDOM  Final   Special Requests NONE  Final   Culture   Final    NO GROWTH Performed at  [facility], [place] [address], [place], [place] [address]    Report Status 02/02/2021 FINAL  Final  Culture, blood (Routine X 2) w Reflex to ID Panel     Status: None (Preliminary result)   Collection  Time: 02/01/21  9:53 AM   Specimen: BLOOD  Result Value Ref Range Status   Specimen Description BLOOD RIGHT ANTECUBITAL  Final   Special Requests   Final    BOTTLES DRAWN AEROBIC ONLY Blood Culture results may not be optimal due to an inadequate volume of blood received in culture bottles   Culture   Final    NO GROWTH 2 DAYS Performed at [facility], [place] [address], [place], [place] [address]    Report Status PENDING  Incomplete         Radiology Studies: No results found.      Scheduled Meds: . aspirin  81 mg Oral Daily  . atorvastatin  80 mg Oral Daily  . carvedilol  3.125 mg Oral BID WC  . Chlorhexidine Gluconate Cloth  6 each Topical Daily  . clopidogrel  75 mg Oral Daily  . enoxaparin (LOVENOX) injection  30 mg Subcutaneous Q24H  . insulin aspart  0-15 Units Subcutaneous TID WC  . insulin aspart  0-5 Units Subcutaneous QHS  . isosorbide-hydrALAZINE  1 tablet Oral TID  . pantoprazole  40 mg Oral Q0600  . predniSONE  40 mg Oral QAC breakfast  . sodium chloride flush  3 mL Intravenous Q12H   Continuous Infusions: . sodium chloride Stopped (02/01/21 0839)  . sodium chloride    . cefTRIAXone (ROCEPHIN)  IV 1 g (02/03/21 1718)     LOS: 6 days        Hosie Poisson, MD [facility]   To contact the attending provider between 7A-7P or the covering provider during after hours 7P-7A, please log into the web site [email or web address] and access using universal [place] password for that web site. If you do not have the password, please call the hospital operator.  02/04/2021, 1:33 PM

## 2021-02-04 NOTE — Care Management (Signed)
Case manager discussed discharge plan with both of patient's daughter's via telephone today. They are going to figure out who will pick up patient or if he will need transportation home. One of them will contact Case Manager this afternoon. TOC Team will continue to monitor.  Ricki Miller, RN BSN Case Manager

## 2021-02-04 NOTE — Care Management Important Message (Signed)
Important Message  Patient Details  Name: Randy [name] MRN: [id number] Date of Birth: 1941-10-12   Medicare Important Message Given:  Yes     [name] 02/04/2021, 11:14 AM

## 2021-02-04 NOTE — Progress Notes (Signed)
Cardiology Progress Note  Patient ID: Randy [name] MRN: [id number] DOB: 08-Jan-1941 Date of Encounter: 02/04/2021  Primary Cardiologist: Donato Heinz, MD  Subjective   Chief Complaint: None  HPI: He reports he walked the hall and had no chest pain.  He reports his legs were tired but no chest discomfort.  ROS:  All other ROS reviewed and negative. Pertinent positives noted in the HPI.     Inpatient Medications  Scheduled Meds: . aspirin  81 mg Oral Daily  . atorvastatin  80 mg Oral Daily  . carvedilol  3.125 mg Oral BID WC  . Chlorhexidine Gluconate Cloth  6 each Topical Daily  . clopidogrel  75 mg Oral Daily  . enoxaparin (LOVENOX) injection  30 mg Subcutaneous Q24H  . insulin aspart  0-15 Units Subcutaneous TID WC  . insulin aspart  0-5 Units Subcutaneous QHS  . isosorbide-hydrALAZINE  1 tablet Oral TID  . pantoprazole  40 mg Oral Q0600  . predniSONE  40 mg Oral QAC breakfast  . sodium chloride flush  3 mL Intravenous Q12H   Continuous Infusions: . sodium chloride Stopped (02/01/21 0839)  . sodium chloride    . cefTRIAXone (ROCEPHIN)  IV 1 g (02/03/21 1718)   PRN Meds: sodium chloride, acetaminophen, alum & mag hydroxide-simeth, nitroGLYCERIN, ondansetron (ZOFRAN) IV, polyvinyl alcohol, sodium chloride flush   Vital Signs   Vitals:   02/02/21 1620 02/03/21 2100 02/04/21 0345 02/04/21 0837  BP: (!) 136/57 (!) 125/50 135/74 (!) 136/55  Pulse: 74   69  Resp:  18 18   Temp: (!) 97.5 F (36.4 C) 97.7 F (36.5 C) 97.9 F (36.6 C)   TempSrc: Oral Oral Oral   SpO2:   98%   Weight:      Height:        Intake/Output Summary (Last 24 hours) at 02/04/2021 0950 Last data filed at 02/04/2021 0600 Gross per 24 hour  Intake 400 ml  Output 1950 ml  Net -1550 ml   Last 3 Weights 02/02/2021 02/01/2021 01/31/2021  Weight (lbs) 149 lb 14.6 oz 146 lb 2.6 oz 149 lb 11.1 oz  Weight (kg) 68 kg 66.3 kg 67.9 kg      Telemetry  Overnight telemetry shows sinus  rhythm in the 70s, which I personally reviewed.   ECG  The most recent ECG shows sinus rhythm heart rate 72, LVH with marked repolarization abnormality, which I personally reviewed.   Physical Exam   Vitals:   02/02/21 1620 02/03/21 2100 02/04/21 0345 02/04/21 0837  BP: (!) 136/57 (!) 125/50 135/74 (!) 136/55  Pulse: 74   69  Resp:  18 18   Temp: (!) 97.5 F (36.4 C) 97.7 F (36.5 C) 97.9 F (36.6 C)   TempSrc: Oral Oral Oral   SpO2:   98%   Weight:      Height:         Intake/Output Summary (Last 24 hours) at 02/04/2021 0950 Last data filed at 02/04/2021 0600 Gross per 24 hour  Intake 400 ml  Output 1950 ml  Net -1550 ml    Last 3 Weights 02/02/2021 02/01/2021 01/31/2021  Weight (lbs) 149 lb 14.6 oz 146 lb 2.6 oz 149 lb 11.1 oz  Weight (kg) 68 kg 66.3 kg 67.9 kg    Body mass index is 22.14 kg/m.  General: Well nourished, well developed, in no acute distress Head: Atraumatic, normal size  Eyes: PEERLA, EOMI  Neck: Supple, no JVD Endocrine: No thryomegaly Cardiac: Normal S1, S2;  RRR; no murmurs, rubs, or gallops Lungs: Clear to auscultation bilaterally, no wheezing, rhonchi or rales  Abd: Soft, nontender, no hepatomegaly  Ext: No edema, pulses 2+ Musculoskeletal: No deformities, BUE and BLE strength normal and equal Skin: Warm and dry, no rashes   Neuro: Alert and oriented to person, place, time, and situation, CNII-XII grossly intact, no focal deficits  Psych: Normal mood and affect   Labs  High Sensitivity Troponin:   Recent Labs  Lab 01/29/21 0725 01/29/21 0913 01/29/21 1210 01/29/21 1700 01/29/21 1945  TROPONINIHS 16,150* 14,097* 16,532* 14,215* 15,900*     Cardiac EnzymesNo results for input(s): TROPONINI in the last 168 hours. No results for input(s): TROPIPOC in the last 168 hours.  Chemistry Recent Labs  Lab 01/30/21 0442 01/30/21 1335 02/01/21 0151 02/02/21 0130 02/03/21 0236  NA 138   < > 136 135 137  K 4.3   < > 4.0 4.3 4.2  CL 104   < > 101  101 104  CO2 21*   < > 23 21* 21*  GLUCOSE 120*   < > 150* 222* 196*  BUN 48*   < > 41* 48* 55*  CREATININE 2.26*   < > 2.32* 2.56* 2.47*  CALCIUM 9.2   < > 8.7* 8.4* 8.2*  PROT 6.5  --   --   --   --   ALBUMIN 3.6  --   --   --   --   AST 57*  --   --   --   --   ALT 31  --   --   --   --   ALKPHOS 49  --   --   --   --   BILITOT 0.6  --   --   --   --   GFRNONAA 28*   < > 28* 24* 26*  ANIONGAP 13   < > 12 13 12    < > = values in this interval not displayed.    Hematology Recent Labs  Lab 01/31/21 1819 02/01/21 0151 02/02/21 0130  WBC 13.5* 15.5* 17.1*  RBC 3.92* 3.98* 3.66*  HGB 10.5* 10.4* 9.8*  HCT 32.4* 33.3* 29.9*  MCV 82.7 83.7 81.7  MCH 26.8 26.1 26.8  MCHC 32.4 31.2 32.8  RDW 13.3 13.2 13.1  PLT 446* 452* 443*   BNP Recent Labs  Lab 01/28/21 2322  BNP 643.2*    DDimer No results for input(s): DDIMER in the last 168 hours.   Radiology  No results found.  Cardiac Studies  LHC 01/23/253   LV end diastolic pressure is moderately elevated.  There is no aortic valve stenosis.  Hemodynamic findings consistent with mild pulmonary hypertension. With mildly elevated LVEDP. Normal cardiac output and index by Fick  -------------------------  Prox RCA to Mid RCA lesion is 50% stenosed.  Dist RCA lesion is 99% (subtotal occlusion) stenosed with 90% stenosed side branch in RPAV. Distal RPL fills via left-to-right collaterals  RPDA lesion is 100% stenosed. PDA fills via septal collaterals  Ostial LM 50% stenosis. Dist LM to Ost LAD lesion is 60% stenosed with 80% stenosed side branch in Ost Cx.  Mid LAD lesion is 90% stenosed with 40% stenosed side branch in 2nd Diag.   SUMMARY  Severe Multivessel CAD:  ? Ostial LM 40 to 50%, distal LM 50 to 60% involving ostial LCx 80;   mid LAD 90% (at major septal and diagonal branches);  ? 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 Akinesis of the inferolateral wall; hypokinesis of the inferior wall;  overall moderate LV dysfunction.  2. Left ventricular ejection fraction, by estimation, is 35 to 40%. The  left ventricle has moderately decreased function. The left ventricle  demonstrates regional wall motion abnormalities (see scoring  diagram/findings for description). There is  moderate left ventricular hypertrophy. Left ventricular diastolic  parameters are consistent with Grade I diastolic dysfunction (impaired  relaxation).  3. Right ventricular systolic function is normal. The right ventricular  size is normal.  4. The mitral valve is normal in structure. Trivial mitral valve  regurgitation. No evidence of mitral stenosis.  5. The aortic valve is tricuspid. Aortic valve regurgitation is mild.  Mild aortic valve sclerosis is present, with no evidence of aortic valve  stenosis.     Patient Profile  Claron Rosencrans is a 80 y.o. male with hypertension, diabetes who was admitted on 01/29/2021 with new onset systolic heart failure and non-STEMI.  Found to have severe three-vessel CAD that is not amendable to surgical percutaneous revascularization.  Medical management was recommended.  Assessment & Plan   1.  Non-STEMI/severe three-vessel CAD including left main disease -60% left main, 80% ostial circumflex, 90% mid LAD, subtotal distal RCA -Hemodynamics were preserved.  Turndown for CABG.  No targets for PCI. -Medical management has been pursued.  He has completed 48 hours of heparin.  Continue DAPT for 1 year. -Continue Lipitor 80 mg daily. -Continue Coreg.  2.  Acute systolic heart failure, ischemic cardiomyopathy -CKD stage IV.  He will continue his beta-blocker.  He is on BiDil 3  times a day.  He cannot be on ACE/ARB/Arni/MRA due to kidney function. -I have ordered 20 mg of Lasix to start today.    3.  CKD stage IV -Unable to tolerate guideline directed medical therapy due to this.  Starting p.o. Lasix today.  For questions or updates, please contact [place] Please consult [email or web address] for contact info under   Time Spent with Patient: I have spent a total of 25 minutes with patient reviewing hospital notes, telemetry, EKGs, labs and examining the patient as well as establishing an assessment and plan that was discussed with the patient.  > 50% of time was spent in direct patient care.    Signed, Addison Naegeli. Audie Box, [place]  02/04/2021 9:50 AM ] \

## 2021-02-05 DIAGNOSIS — I214 Non-ST elevation (NSTEMI) myocardial infarction: Secondary | ICD-10-CM | POA: Diagnosis not present

## 2021-02-05 DIAGNOSIS — N179 Acute kidney failure, unspecified: Secondary | ICD-10-CM | POA: Diagnosis not present

## 2021-02-05 DIAGNOSIS — I509 Heart failure, unspecified: Secondary | ICD-10-CM | POA: Diagnosis not present

## 2021-02-05 LAB — GLUCOSE, CAPILLARY
Glucose-Capillary: 184 mg/dL — ABNORMAL HIGH (ref 70–99)
Glucose-Capillary: 178 mg/dL — ABNORMAL HIGH (ref 70–99)
Glucose-Capillary: 247 mg/dL — ABNORMAL HIGH (ref 70–99)
Glucose-Capillary: 264 mg/dL — ABNORMAL HIGH (ref 70–99)

## 2021-02-05 MED ORDER — NITROGLYCERIN 0.4 MG SL SUBL: 0.4000 mg | SUBLINGUAL_TABLET | SUBLINGUAL | 12 refills | Status: DC | PRN

## 2021-02-05 MED ORDER — PANTOPRAZOLE SODIUM 40 MG PO TBEC: 40.0000 mg | DELAYED_RELEASE_TABLET | Freq: Every day | ORAL | 1 refills | Status: DC

## 2021-02-05 MED ORDER — ATORVASTATIN CALCIUM 80 MG PO TABS: 80.0000 mg | ORAL_TABLET | Freq: Every day | ORAL | 2 refills | Status: DC

## 2021-02-05 MED ORDER — FUROSEMIDE 20 MG PO TABS
20.0000 mg | ORAL_TABLET | Freq: Every day | ORAL | Status: DC
  Administered 2021-02-05 – 2021-02-06 (×2): 20 mg via ORAL
  Filled 2021-02-05 (×2): qty 1

## 2021-02-05 MED ORDER — FUROSEMIDE 20 MG PO TABS: 20.0000 mg | ORAL_TABLET | Freq: Every day | ORAL | 2 refills | Status: DC

## 2021-02-05 MED ORDER — ASPIRIN 81 MG PO CHEW: 81.0000 mg | CHEWABLE_TABLET | Freq: Every day | ORAL | 1 refills | Status: DC

## 2021-02-05 MED ORDER — CARVEDILOL 6.25 MG PO TABS: 6.2500 mg | ORAL_TABLET | Freq: Two times a day (BID) | ORAL | 2 refills | Status: DC

## 2021-02-05 MED ORDER — TAMSULOSIN HCL 0.4 MG PO CAPS: 0.4000 mg | ORAL_CAPSULE | Freq: Every day | ORAL | 1 refills | Status: DC

## 2021-02-05 MED ORDER — CARVEDILOL 6.25 MG PO TABS
6.2500 mg | ORAL_TABLET | Freq: Two times a day (BID) | ORAL | Status: DC
  Administered 2021-02-05 – 2021-02-06 (×3): 6.25 mg via ORAL
  Filled 2021-02-05 (×3): qty 1

## 2021-02-05 MED ORDER — CLOPIDOGREL BISULFATE 75 MG PO TABS: 75.0000 mg | ORAL_TABLET | Freq: Every day | ORAL | 2 refills | Status: DC

## 2021-02-05 MED ORDER — ISOSORB DINITRATE-HYDRALAZINE 20-37.5 MG PO TABS: 1.0000 | ORAL_TABLET | Freq: Three times a day (TID) | ORAL | 2 refills | Status: DC

## 2021-02-05 NOTE — Progress Notes (Signed)
Occupational Therapy Treatment Patient Details Name: Randy [name] MRN: [id number] DOB: [birth year] Today's Date: 02/05/2021    History of present illness 80 y.o. 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 Deemed not to be a surgical or PCI candidate now on medical management. Pt also with RLE pain and suspected gout flare. Xray of foot without acute pathology.   OT comments  Pt progressing towards established OT goals and demonstrating increased activity tolerance. Pt performing functional mobility in hallway simulating home distance with Supervision and RW. Providing education on safe tub transfer techniques. Continue to recommend dc to home with HHOT. Answered questions in preparation for dc later today.    Follow Up Recommendations  Home health OT;Supervision - Intermittent    Equipment Recommendations  None recommended by OT    Recommendations for Other Services      Precautions / Restrictions Precautions Precautions: Fall Restrictions Weight Bearing Restrictions: No       Mobility Bed Mobility Overal bed mobility: Modified Independent             General bed mobility comments: no physical assist needed  Transfers Overall transfer level: Needs assistance Equipment used: None Transfers: Sit to/from Stand Sit to Stand: Supervision         General transfer comment: Supervision for safety    Balance Overall balance assessment: Needs assistance Sitting-balance support: Feet supported Sitting balance-Leahy Scale: Normal     Standing balance support: During functional activity;Bilateral upper extremity supported Standing balance-Leahy Scale: Fair Standing balance comment: able to stand at bedside without UE briefly/to fix mask and only light UE reliance on RW during gait                           ADL either performed or assessed with clinical judgement   ADL Overall ADL's : Needs  assistance/impaired                       Lower Body Dressing Details (indicate cue type and reason): Reaching forward to adjust socks without fatigue or discomfort Toilet Transfer: Supervision/safety;Ambulation;RW (simulated to recliner)         Tub/Shower Transfer Details (indicate cue type and reason): Discussed safe tub transfer techniques. Functional mobility during ADLs: Supervision/safety;Rolling walker General ADL Comments: Pt demonstrating near baseline function. Denies fatigue.     Vision       Perception     Praxis      Cognition Arousal/Alertness: Awake/alert Behavior During Therapy: WFL for tasks assessed/performed Overall Cognitive Status: Difficult to assess                                 General Comments: Pt following simple commands and agreeable to therapy. Difficult to fully assess due to language barrier. Pt prefering to use daughter for interpreter vs phone interpreter. However, daughter busy (preparing to pick him up for dc).        Exercises Exercises: General Lower Extremity;Other exercises General Exercises - Lower Extremity Ankle Circles/Pumps: AROM;Both;15 reps;Seated Long Arc Quad: AROM;Both;10 reps;Seated Hip Flexion/Marching: AROM;Both;10 reps;Standing Mini-Sqauts: AROM;Both;10 reps;Standing Other Exercises Other Exercises: BLE AROM: standing hamstring curls x10   Shoulder Instructions       General Comments HR 60-70s    Pertinent Vitals/ Pain       Pain Assessment: Faces Faces Pain Scale: Hurts a  little bit Pain Location: BLE with steps Pain Descriptors / Indicators: Sore Pain Intervention(s): Monitored during session;Repositioned  Home Living                                          Prior Functioning/Environment              Frequency  Min 2X/week        Progress Toward Goals  OT Goals(current goals can now be found in the care plan section)  Progress towards OT goals:  Progressing toward goals  Acute Rehab OT Goals Patient Stated Goal: go home OT Goal Formulation: All assessment and education complete, DC therapy ADL Goals Pt Will Perform Grooming: with modified independence;standing Pt Will Perform Lower Body Dressing: with modified independence;sit to/from stand Pt Will Transfer to Toilet: with modified independence;ambulating Additional ADL Goal #1: Pt will demonstrate independence with 3 energy conservation strategies during ADL and functional mobility.  Plan Discharge plan remains appropriate    Co-evaluation                 AM-PAC OT "6 Clicks" Daily Activity     Outcome Measure   Help from another person eating meals?: None Help from another person taking care of personal grooming?: A Little Help from another person toileting, which includes using toliet, bedpan, or urinal?: A Little Help from another person bathing (including washing, rinsing, drying)?: A Little Help from another person to put on and taking off regular upper body clothing?: None Help from another person to put on and taking off regular lower body clothing?: A Little 6 Click Score: 20    End of Session Equipment Utilized During Treatment: Gait belt;Rolling walker  OT Visit Diagnosis: Other abnormalities of gait and mobility (R26.89);Muscle weakness (generalized) (M62.81)   Activity Tolerance Patient tolerated treatment well   Patient Left in chair;with call bell/phone within reach;with chair alarm set   Nurse Communication Mobility status        Time: 6283-6629 OT Time Calculation (min): 11 min  Charges: OT General Charges $OT Visit: 1 Visit OT Treatments $Therapeutic Activity: 8-22 mins  [place], OTR/L Acute Rehab Pager: [phone] Office: [place] 02/05/2021, 12:46 PM

## 2021-02-05 NOTE — Discharge Summary (Signed)
Physician Discharge Summary  Din Bookwalter HQI:[id number] DOB: 04/10/1941 DOA: 01/28/2021  PCP: Jilda Panda, MD  Admit date: 01/28/2021 Discharge date: 02/05/2021  Admitted From: home.  Disposition:  Home.   Recommendations for Outpatient Follow-up:  1. Follow up with PCP in 1-2 weeks 2. Please obtain BMP/CBC in one week 3. Please follow up Cardiology as recommended.  4.         Recommend repeat CT in about 3 months time for further evaluation 5.         Recommend outpatient follow up with Dr [facility] Meckel [facility].   Home Health:YES   Discharge Condition:stable. CODE STATUS: FULL CODE Diet recommendation: Heart Healthy / Carb Modified  Brief/Interim Summary: Randy Syriphoneis a 80 y.o.malewith medical history significant ofHTN, presents with chest pain and sob.  Meanwhile pt had traumatic foley placement resulting in hematuria on CBI,  Urine remains lightly bloody on Slow CBI gtt. Urology consulted and he underwent coude catheter placement. CBI was removed. Voiding trial today and he had already urinated twice today.  Pt was also started on nitro gtt for chest pain IV heparin and cardiology consulted.  Underwent cardiac cath showing Severe Multivessel CAD and Moderately reduced LVEF 35 to 40% by Echocardiogram. He was transferred to ICU and CVTS consulted . An intra aortic balloon pump was placed in the cath lab and removed on 01/31/2021. Pt was not deemed a candidate for CABG due to calcifications of the aortic root and ascending aorta extending into aortic arch and not a candidate for PCI  As per interventional cardiology given his anatomy. Cardiology recommending medical management at this time.    Discharge Diagnoses:  Principal Problem:   New onset of congestive heart failure (HCC) Active Problems:   Renal insufficiency   Hypertensive crisis   Acute respiratory failure with hypoxia (HCC)   Acute pulmonary edema (HCC)   AKI (acute kidney injury) ([place])   Non-STEMI (non-ST  elevated myocardial infarction) ([place])   NSTEMI;  Pt presents with chest pain and sob.  Troponin peaked at 16,532.  Echo showed akinesis of inferolateral wall with hypokinesis of inferior wall , LVEF of 35% to 40 %.  Continue with aspirin, plavix, statin, and bidil on discharge.  Cardiac cath today showed multivessel CAD and reduced LVEF 35%.  Pt was not deemed a candidate for CABG due to calcifications of the aortic root and ascending aorta extending into aortic arch, not a candidate for PCI  As per interventional cardiology given his anatomy. Cardiology recommending medical management at this time. No new complaints at this time.     Acute combined systolic and diastolic heart failure.  Initially diuresed with IV lasix, transitioned to oral lasix 40 mg daily, which has been held for hypotension. His BP parameters improved, and he was started on low dose lasix 20 mg daily.  Weaned off oxygen.  Continue with strict intake and output.  He appears euvolemic.     Hematuria from traumatic foley placement resulting in hematuria on CBI, urine remains lightly bloody on Slow CBI gtt. Urology consulted and he underwent coude catheter placement. CBI removed.  Recommend outpatient follow up  With urology.  Coude catheter discontinued and flomax started.   Stage 4 CKD:  Creatinine around 2.2.    Fever with leukocytosis:  ? Coude catheter placement vs gout.  - UA ordered , showed Moderate leukocytes, with rare bacteria. Urine cultures negative.   - blood cultures ordered, pending, and negative.  - completed 5 days of rocephin.  Gouty flare up:  In view of his stage 4 CKD, completed a prednisone course.   x ray of the right foot is negative. He was able to tolerate the walking and weight on his feet.    Hyperlipidemia:  Resume lipitor.    Lung nodule  CT of the chest shows 9 mm nodule in the left apex. Recommend repeat CT in about 3 months time for further  evaluation  PT/OT evaluation ordered recommending home health PT.    Discharge Instructions  Discharge Instructions    Avoid straining   Complete by: As directed    Diet - low sodium heart healthy   Complete by: As directed    Discharge instructions   Complete by: As directed    Please follow up with cardiology as recommended.  Please follow up with Dr [facility]ck[facility] for voiding trial in one week.   Heart Failure patients record your daily weight using the same scale at the same time of day   Complete by: As directed    Increase activity slowly   Complete by: As directed    STOP any activity that causes chest pain, shortness of breath, dizziness, sweating, or exessive weakness   Complete by: As directed      Allergies as of 02/05/2021   No Known Allergies     Medication List    STOP taking these medications   amLODipine 5 MG tablet Commonly known as: NORVASC   colchicine 0.6 MG tablet   metFORMIN 500 MG tablet Commonly known as: GLUCOPHAGE     TAKE these medications   aspirin 81 MG chewable tablet Chew 1 tablet (81 mg total) by mouth daily. Start taking on: February 06, 2021   atorvastatin 80 MG tablet Commonly known as: LIPITOR Take 1 tablet (80 mg total) by mouth daily. Start taking on: February 06, 2021   carvedilol 6.25 MG tablet Commonly known as: COREG Take 1 tablet (6.25 mg total) by mouth 2 (two) times daily with a meal.   clopidogrel 75 MG tablet Commonly known as: PLAVIX Take 1 tablet (75 mg total) by mouth daily. Start taking on: February 06, 2021   furosemide 20 MG tablet Commonly known as: LASIX Take 1 tablet (20 mg total) by mouth daily.   isosorbide-hydrALAZINE 20-37.5 MG tablet Commonly known as: BIDIL Take 1 tablet by mouth 3 (three) times daily.   nitroGLYCERIN 0.4 MG SL tablet Commonly known as: NITROSTAT Place 1 tablet (0.4 mg total) under the tongue every 5 (five) minutes x 3 doses as needed for chest pain.   pantoprazole  40 MG tablet Commonly known as: PROTONIX Take 1 tablet (40 mg total) by mouth daily at 6 (six) AM. Start taking on: February 06, 2021   tamsulosin 0.4 MG Caps capsule Commonly known as: FLOMAX Take 1 capsule (0.4 mg total) by mouth daily after supper.            Durable Medical Equipment  (From admission, onward)         Start     Ordered   02/03/21 1123  For home use only DME Walker rolling  Once       Question Answer Comment  Walker: With 5 Inch Wheels   Patient needs a walker to treat with the following condition NSTEMI (non-ST elevated myocardial infarction) ([place])      02/03/21 1122          Follow-up Information    [facility] Follow up.   Why: Rolling  Walker- to be delivered to the room.  Contact information: [place] [address] [phone]        [facility] Follow up.   Specialty: Home Health Services Why: Registered Nurse, Physical Therapy and Social Worker- office to arrange visit times.  Contact information: [address] [place] [address] [phone]        Deberah Pelton, NP Follow up.   Specialty: Cardiology Why: Hospital follow-up with Cardiology scheduled for 02/18/2021 at 8:45am with Coletta Memos, one of Dr. Newman Nickels NPs. Please arrive 15 minutes early for check-in. If this date/time does not work for you, please call our office to reschedule. Contact information: [address] [place] [address] [phone]        Ardis Hughs, MD In 1 week.   Specialty: Urology Why: Catheter removal Contact information: [place] [address] [phone]              No Known Allergies  Consultations:  Cardiology  Urology.    Procedures/Studies: DG Chest 1 View  Result Date: 01/31/2021 CLINICAL DATA:  80 year old male intra-aortic balloon pump. In STEMI. Heart failure. EXAM: CHEST  1 VIEW COMPARISON:  CT chest 01/30/2021 and  earlier. FINDINGS: AP chest at 0425 hours. Mildly larger lung volumes from the CT yesterday. Stable cardiac size and mediastinal contours. Marker of intra-aortic balloon pump at the descending thoracic aorta located just below the level of the left mainstem bronchus, unchanged from CT. No pneumothorax. Pulmonary vascularity has improved since 01/28/2021, no overt edema. No pleural effusion or confluent pulmonary opacity. Visualized tracheal air column is within normal limits. Stable visualized osseous structures. IMPRESSION: 1. Intra-aortic balloon pump marker in the descending aorta, unchanged from CT yesterday. 2. Regressed/resolved pulmonary edema since 01/28/2021. Electronically Signed   By: Genevie Ann M.D.   On: 01/31/2021 04:49   CT CHEST WO CONTRAST  Result Date: 01/31/2021 CLINICAL DATA:  Chest pain, pre CABG EXAM: CT CHEST WITHOUT CONTRAST TECHNIQUE: Multidetector CT imaging of the chest was performed following the standard protocol without IV contrast. COMPARISON:  None. FINDINGS: Cardiovascular: Heart is borderline in size. Aorta normal caliber. Diffuse aortic calcifications. Moderate coronary artery and great vessel calcifications. Presumed intra-aortic balloon pump within the aorta with the tip in the mid descending thoracic aorta. Mediastinum/Nodes: No mediastinal, hilar, or axillary adenopathy. Trachea and esophagus are unremarkable. Thyroid unremarkable. Lungs/Pleura: 9 mm nodule in the left apex. Calcified granulomas in the right lung. No effusions. Upper Abdomen: Calcified granulomas in the liver and spleen. Musculoskeletal: Chest wall soft tissues are unremarkable. No acute bony abnormality. IMPRESSION: Coronary artery disease. 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 Old granulomatous disease. 9 mm nodule in the left apex. Consider one of the following in 3 months for both low-risk and high-risk individuals: (a) repeat chest CT, (b) follow-up PET-CT,  or (c) tissue sampling. This recommendation follows the consensus statement: Guidelines for Management of Incidental Pulmonary Nodules Detected on CT Images: From the Fleischner Society 2017; Radiology 2017; 284:228-243. Aortic Atherosclerosis (ICD10-I70.0). Electronically Signed   By: Rolm Baptise M.D.   On: 01/31/2021 00:08   CARDIAC CATHETERIZATION  Result Date: 06/28/6753  LV end diastolic pressure is moderately elevated.  There is no aortic valve stenosis.  Hemodynamic findings consistent with mild pulmonary hypertension. With mildly elevated LVEDP. Normal cardiac output and index by Fick  -------------------------  Prox RCA to Mid RCA lesion is  50% stenosed.  Dist RCA lesion is 99% (subtotal occlusion) stenosed with 90% stenosed side branch in RPAV. Distal RPL fills via left-to-right collaterals  RPDA lesion is 100% stenosed. PDA fills via septal collaterals  Ostial LM 50% stenosis. Dist LM to Ost LAD lesion is 60% stenosed with 80% stenosed side branch in Ost Cx.  Mid LAD lesion is 90% stenosed with 40% stenosed side branch in 2nd Diag.  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  CVTS consulted.  Per request, we will check a noncontrasted chest CT  Dr. Gardiner Rhyme has agreed to discussed the case with the patient's daughter who is a cardiac nurse.  With the language barrier this is very difficult discussion having he is extremely scared.  At this point I think his only real option is bypass surgery.  IV heparin  was started per pharmacy protocol for IABP. He was hemodynamically stable, chest pain-free and breathing well leave the Cath Lab. Glenetta Hew, MD   US RENAL  Result Date: 01/29/2021 CLINICAL DATA:  AKI EXAM: RENAL / URINARY TRACT ULTRASOUND COMPLETE COMPARISON:  10/15/2018 and prior. FINDINGS: Right Kidney: Renal measurements: 8.9 x 4.5 x 4.9 cm = volume: 102.7 mL. Diffuse cortical thinning. Echogenicity within normal limits. No mass or hydronephrosis visualized. Left Kidney: Renal measurements: 9.4 x 5.5 x 5.3 cm. = volume: 143.9 mL. Diffuse cortical thinning. Echogenicity within normal limits. No mass or hydronephrosis visualized. Bladder: Appears normal for degree of bladder distention. Other: Enlarged prostate measuring 6.5 x 3.9 x 5.0 cm. IMPRESSION: Bilateral cortical atrophy. Otherwise unremarkable sonographic appearance of the kidneys. Prostatomegaly. Electronically Signed   By: Primitivo Gauze M.D.   On: 01/29/2021 12:55   DG CHEST PORT 1 VIEW  Result Date: 02/01/2021 CLINICAL DATA:  Fever and shortness of breath EXAM: PORTABLE CHEST 1 VIEW COMPARISON:  January 31, 2021 FINDINGS: There is no edema or airspace opacity. Heart is mildly enlarged with pulmonary vascularity normal. Intra-aortic balloon pump no longer evident. There is aortic atherosclerosis. No adenopathy. No bone lesions. IMPRESSION: Intra-aortic balloon pump no longer evident. Stable cardiac prominence. Lungs clear. Aortic Atherosclerosis (ICD10-I70.0). Electronically Signed   By: Lowella Grip III M.D.   On: 02/01/2021 09:21   DG Chest Port 1 View  Result Date: 01/28/2021 CLINICAL DATA:  Shortness of breath EXAM: PORTABLE CHEST 1 VIEW COMPARISON:  None. FINDINGS: Heart is borderline in size. Vascular congestion. Mild interstitial prominence could reflect interstitial edema. Right basilar opacity, likely atelectasis with small right effusion. IMPRESSION: Borderline heart size with vascular congestion and possible early  interstitial edema. Small right effusion with right base atelectasis. Electronically Signed   By: Rolm Baptise M.D.   On: 01/28/2021 22:55   DG Foot Complete Right  Result Date: 02/01/2021 CLINICAL DATA:  Right foot pain. EXAM: RIGHT FOOT COMPLETE - 3+ VIEW COMPARISON:  None. FINDINGS: There is no evidence of fracture or dislocation. There is no evidence of arthropathy or other focal bone abnormality. Soft tissues are unremarkable. IMPRESSION: Negative. Electronically Signed  By: Marijo Conception M.D.   On: 02/01/2021 14:55   ECHOCARDIOGRAM COMPLETE  Result Date: 01/29/2021    ECHOCARDIOGRAM REPORT   Patient Name:   Randy [name] Date of Exam: 01/29/2021 Medical Rec #:  [id number]          Height: Accession #:    [id number]         Weight: Date of Birth:  11/14/1941           BSA: Patient Age:    57 years           BP:           181/76 mmHg Patient Gender: M                  HR:           73 bpm. Exam Location:  Inpatient Procedure: 2D Echo, Cardiac Doppler, Color Doppler and Strain Analysis Indications:    CHF-Acute Systolic H21.22  History:        Patient has no prior history of Echocardiogram examinations.                 Signs/Symptoms:Chest Pain; Risk Factors:Hypertension, Diabetes                 and Dyslipidemia. Elevated Troponin.  Sonographer:    Darlina Sicilian RDCS Referring Phys: [place]  1. Akinesis of the inferolateral wall; hypokinesis of the inferior wall; overall moderate LV dysfunction.  2. Left ventricular ejection fraction, by estimation, is 35 to 40%. The left ventricle has moderately decreased function. The left ventricle demonstrates regional wall motion abnormalities (see scoring diagram/findings for description). There is moderate left ventricular hypertrophy. Left ventricular diastolic parameters are consistent with Grade I diastolic dysfunction (impaired relaxation).  3. Right ventricular systolic function is normal. The right ventricular size is normal.  4.  The mitral valve is normal in structure. Trivial mitral valve regurgitation. No evidence of mitral stenosis.  5. The aortic valve is tricuspid. Aortic valve regurgitation is mild. Mild aortic valve sclerosis is present, with no evidence of aortic valve stenosis. FINDINGS  Left Ventricle: Left ventricular ejection fraction, by estimation, is 35 to 40%. The left ventricle has moderately decreased function. The left ventricle demonstrates regional wall motion abnormalities. The left ventricular internal cavity size was normal in size. There is moderate left ventricular hypertrophy. Left ventricular diastolic parameters are consistent with Grade I diastolic dysfunction (impaired relaxation). Right Ventricle: The right ventricular size is normal. Right ventricular systolic function is normal. Left Atrium: Left atrial size was normal in size. Right Atrium: Right atrial size was normal in size. Pericardium: There is no evidence of pericardial effusion. Mitral Valve: The mitral valve is normal in structure. Trivial mitral valve regurgitation. No evidence of mitral valve stenosis. Tricuspid Valve: The tricuspid valve is normal in structure. Tricuspid valve regurgitation is trivial. No evidence of tricuspid stenosis. Aortic Valve: The aortic valve is tricuspid. Aortic valve regurgitation is mild. Aortic regurgitation PHT measures 419 msec. Mild aortic valve sclerosis is present, with no evidence of aortic valve stenosis. Pulmonic Valve: The pulmonic valve was not well visualized. Pulmonic valve regurgitation is trivial. No evidence of pulmonic stenosis. Aorta: The aortic root is normal in size and structure. Venous: The inferior vena cava was not well visualized.  Additional Comments: Akinesis of the inferolateral wall; hypokinesis of the inferior wall; overall moderate LV dysfunction.  LEFT VENTRICLE PLAX 2D LVIDd:  4.70 cm LVIDs:         3.70 cm LV PW:         1.30 cm LV IVS:        1.50 cm LVOT diam:     2.00 cm  LVOT Area:     3.14 cm  LV Volumes (MOD) LV vol d, MOD A2C: 107.0 ml LV vol d, MOD A4C: 167.0 ml LV vol s, MOD A2C: 61.2 ml LV vol s, MOD A4C: 93.9 ml LV SV MOD A2C:     45.8 ml LV SV MOD A4C:     167.0 ml LV SV MOD BP:      62.5 ml RIGHT VENTRICLE RV S prime:     18.30 cm/s TAPSE (M-mode): 1.2 cm LEFT ATRIUM LA diam:      4.50 cm LA Vol (A2C): 46.9 ml LA Vol (A4C): 63.6 ml  AORTIC VALVE AI PHT:      419 msec  AORTA Ao Root diam: 3.20 cm Ao Asc diam:  3.30 cm  SHUNTS Systemic Diam: 2.00 cm Kirk Ruths MD Electronically signed by Kirk Ruths MD Signature Date/Time: 01/29/2021/12:59:05 PM    Final    VAS [place] LOWER EXTREMITY VENOUS (DVT)  Result Date: 02/02/2021  Lower Venous DVT Study Indications: Pain in back of right leg.  Comparison Study: No prior. Performing Technologist: Oda Cogan RDMS, RVT  Examination Guidelines: A complete evaluation includes B-mode imaging, spectral Doppler, color Doppler, and power Doppler as needed of all accessible portions of each vessel. Bilateral testing is considered an integral part of a complete examination. Limited examinations for reoccurring indications may be performed as noted. The reflux portion of the exam is performed with the patient in reverse Trendelenburg.  +---------+---------------+---------+-----------+----------+--------------+ RIGHT    CompressibilityPhasicitySpontaneityPropertiesThrombus Aging +---------+---------------+---------+-----------+----------+--------------+ CFV      Full           Yes                                          +---------+---------------+---------+-----------+----------+--------------+ SFJ      Full                    Yes                                 +---------+---------------+---------+-----------+----------+--------------+ FV Prox  Full                                                        +---------+---------------+---------+-----------+----------+--------------+ FV Mid   Full                                                         +---------+---------------+---------+-----------+----------+--------------+ FV DistalFull                                                        +---------+---------------+---------+-----------+----------+--------------+  PFV      Full                                                        +---------+---------------+---------+-----------+----------+--------------+ POP      Full           Yes      Yes                                 +---------+---------------+---------+-----------+----------+--------------+ PTV      Full                                                        +---------+---------------+---------+-----------+----------+--------------+ PERO     Full                                                        +---------+---------------+---------+-----------+----------+--------------+   +----+---------------+---------+-----------+----------+--------------+ LEFTCompressibilityPhasicitySpontaneityPropertiesThrombus Aging +----+---------------+---------+-----------+----------+--------------+ CFV Full           Yes      Yes                                 +----+---------------+---------+-----------+----------+--------------+ SFJ Full                                                        +----+---------------+---------+-----------+----------+--------------+     Summary: RIGHT: - There is no evidence of deep vein thrombosis in the lower extremity.  - No cystic structure found in the popliteal fossa.  LEFT: - No evidence of common femoral vein obstruction.  *See table(s) above for measurements and observations. Electronically signed by Harold Barban MD on 02/02/2021 at 7:59:15 PM.    Final        Subjective: No new complaints.   Discharge Exam: Vitals:   02/04/21 1946 02/05/21 0918  BP: (!) 123/52 (!) 137/53  Pulse: 60 73  Resp: 17   Temp: (!) 97.3 F (36.3 C)   SpO2: 100%    Vitals:   02/04/21  1624 02/04/21 1804 02/04/21 1946 02/05/21 0918  BP: 138/64  (!) 123/52 (!) 137/53  Pulse: 60 64 60 73  Resp: 15  17   Temp: (!) 97.4 F (36.3 C)  (!) 97.3 F (36.3 C)   TempSrc: Oral  Oral   SpO2: 98%  100%   Weight:      Height:        General: Pt is alert, awake, not in acute distress Cardiovascular: RRR, S1/S2 +, no rubs, no gallops Respiratory: CTA bilaterally, no wheezing, no rhonchi Abdominal: Soft, NT, ND, bowel sounds + Extremities: no edema, no cyanosis    The results of significant diagnostics from this hospitalization (including imaging, microbiology, ancillary and laboratory) are  listed below for reference.     Microbiology: Recent Results (from the past 240 hour(s))  SARS Coronavirus 2 by RT PCR (hospital order, performed in [facility] hospital lab) Nasopharyngeal Nasopharyngeal Swab     Status: None   Collection Time: 01/30/21  4:41 AM   Specimen: Nasopharyngeal Swab  Result Value Ref Range Status   SARS Coronavirus 2 NEGATIVE NEGATIVE Final    Comment: (NOTE) SARS-CoV-2 target nucleic acids are NOT DETECTED.  The SARS-CoV-2 RNA is generally detectable in upper and lower respiratory specimens during the acute phase of infection. The lowest concentration of SARS-CoV-2 viral copies this assay can detect is 250 copies / mL. A negative result does not preclude SARS-CoV-2 infection and should not be used as the sole basis for treatment or other patient management decisions.  A negative result may occur with improper specimen collection / handling, submission of specimen other than nasopharyngeal swab, presence of viral mutation(s) within the areas targeted by this assay, and inadequate number of viral copies (<250 copies / mL). A negative result must be combined with clinical observations, patient history, and epidemiological information.  Fact Sheet for Patients:   StrictlyIdeas.no  Fact Sheet for Healthcare  Providers: BankingDealers.co.za  This test is not yet approved or  cleared by the [place] FDA and has been authorized for detection and/or diagnosis of SARS-CoV-2 by FDA under an Emergency Use Authorization (EUA).  This EUA will remain in effect (meaning this test can be used) for the duration of the COVID-19 declaration under Section 564(b)(1) of the Act, 21 U.S.C. section 360bbb-3(b)(1), unless the authorization is terminated or revoked sooner.  Performed at [facility], [place] [address], [place], [place] [address]   Surgical pcr screen     Status: None   Collection Time: 01/30/21  8:34 PM   Specimen: Nasal Mucosa; Nasal Swab  Result Value Ref Range Status   MRSA, PCR NEGATIVE NEGATIVE Final   Staphylococcus aureus NEGATIVE NEGATIVE Final    Comment: (NOTE) The Xpert SA Assay (FDA approved for NASAL specimens in patients 24 years of age and older), is one component of a comprehensive surveillance program. It is not intended to diagnose infection nor to guide or monitor treatment. Performed at [facility], [place] [address], [place], [place] [address]   Urine Culture     Status: None   Collection Time: 02/01/21  9:02 AM   Specimen: Urine, Random  Result Value Ref Range Status   Specimen Description URINE, RANDOM  Final   Special Requests NONE  Final   Culture   Final    NO GROWTH Performed at [facility], [place] [address], [place], [place] [address]    Report Status 02/02/2021 FINAL  Final  Culture, blood (Routine X 2) w Reflex to ID Panel     Status: None (Preliminary result)   Collection Time: 02/01/21  9:53 AM   Specimen: BLOOD  Result Value Ref Range Status   Specimen Description BLOOD RIGHT ANTECUBITAL  Final   Special Requests   Final    BOTTLES DRAWN AEROBIC ONLY Blood Culture results may not be optimal due to an inadequate volume of blood received in culture bottles   Culture   Final    NO GROWTH 3 DAYS Performed at  [facility], [place] [address] [place],  [address]    Report Status PENDING  Incomplete     Labs: BNP (last 3 results) Recent Labs    01/28/21  2322  BNP 885.0*   Basic Metabolic Panel: Recent Labs  Lab 01/30/21 0442 01/30/21 1335 01/31/21 0106 02/01/21 0151 02/02/21 0130 02/03/21 0236 02/04/21 0958  NA 138   < > 137 136 135 137 135  K 4.3   < > 4.1 4.0 4.3 4.2 4.0  CL 104  --  104 101 101 104 102  CO2 21*  --  19* 23 21* 21* 22  GLUCOSE 120*  --  131* 150* 222* 196* 222*  BUN 48*  --  44* 41* 48* 55* 50*  CREATININE 2.26*  --  2.34* 2.32* 2.56* 2.47* 2.29*  CALCIUM 9.2  --  8.7* 8.7* 8.4* 8.2* 8.3*  MG 1.8  --   --   --   --   --   --    < > = values in this interval not displayed.   Liver Function Tests: Recent Labs  Lab 01/30/21 0442  AST 57*  ALT 31  ALKPHOS 49  BILITOT 0.6  PROT 6.5  ALBUMIN 3.6   No results for input(s): LIPASE, AMYLASE in the last 168 hours. No results for input(s): AMMONIA in the last 168 hours. CBC: Recent Labs  Lab 01/31/21 0106 01/31/21 1819 02/01/21 0151 02/02/21 0130 02/04/21 0958  WBC 14.3* 13.5* 15.5* 17.1* 14.8*  NEUTROABS  --   --   --   --  11.9*  HGB 11.4* 10.5* 10.4* 9.8* 10.7*  HCT 34.1* 32.4* 33.3* 29.9* 34.6*  MCV 81.4 82.7 83.7 81.7 84.0  PLT 479* 446* 452* 443* 604*   Cardiac Enzymes: No results for input(s): CKTOTAL, CKMB, CKMBINDEX, TROPONINI in the last 168 hours. BNP: Invalid input(s): POCBNP CBG: Recent Labs  Lab 02/04/21 0759 02/04/21 1102 02/04/21 1619 02/04/21 2127 02/05/21 0744  GLUCAP 120* 190* 240* 335* 184*   D-Dimer No results for input(s): DDIMER in the last 72 hours. Hgb A1c No results for input(s): HGBA1C in the last 72 hours. Lipid Profile No results for input(s): CHOL, HDL, LDLCALC, TRIG, CHOLHDL, LDLDIRECT in the last 72 hours. Thyroid function studies No results for input(s): TSH, T4TOTAL, T3FREE, THYROIDAB in the last 72 hours.  Invalid input(s): FREET3 Anemia  work up No results for input(s): VITAMINB12, FOLATE, FERRITIN, TIBC, IRON, RETICCTPCT in the last 72 hours. Urinalysis    Component Value Date/Time   COLORURINE YELLOW 02/01/2021 0902   APPEARANCEUR HAZY (A) 02/01/2021 0902   LABSPEC 1.012 02/01/2021 0902   PHURINE 5.0 02/01/2021 0902   GLUCOSEU NEGATIVE 02/01/2021 0902   HGBUR LARGE (A) 02/01/2021 0902   BILIRUBINUR NEGATIVE 02/01/2021 0902   KETONESUR NEGATIVE 02/01/2021 0902   PROTEINUR 30 (A) 02/01/2021 0902   UROBILINOGEN 0.2 08/06/2011 0313   NITRITE NEGATIVE 02/01/2021 0902   LEUKOCYTESUR MODERATE (A) 02/01/2021 0902   Sepsis Labs Invalid input(s): PROCALCITONIN,  WBC,  LACTICIDVEN Microbiology Recent Results (from the past 240 hour(s))  SARS Coronavirus 2 by RT PCR (hospital order, performed in [place] hospital lab) Nasopharyngeal Nasopharyngeal Swab     Status: None   Collection Time: 01/30/21  4:41 AM   Specimen: Nasopharyngeal Swab  Result Value Ref Range Status   SARS Coronavirus 2 NEGATIVE NEGATIVE Final    Comment: (NOTE) SARS-CoV-2 target nucleic acids are NOT DETECTED.  The SARS-CoV-2 RNA is generally detectable in upper and lower respiratory specimens during the acute phase of infection. The lowest concentration of SARS-CoV-2 viral copies this assay can detect is 250 copies / mL. A negative result does not preclude SARS-CoV-2 infection and should  not be used as the sole basis for treatment or other patient management decisions.  A negative result may occur with improper specimen collection / handling, submission of specimen other than nasopharyngeal swab, presence of viral mutation(s) within the areas targeted by this assay, and inadequate number of viral copies (<250 copies / mL). A negative result must be combined with clinical observations, patient history, and epidemiological information.  Fact Sheet for Patients:   StrictlyIdeas.no  Fact Sheet for Healthcare  Providers: BankingDealers.co.za  This test is not yet approved or  cleared by the [place] FDA and has been authorized for detection and/or diagnosis of SARS-CoV-2 by FDA under an Emergency Use Authorization (EUA).  This EUA will remain in effect (meaning this test can be used) for the duration of the COVID-19 declaration under Section 564(b)(1) of the Act, 21 U.S.C. section 360bbb-3(b)(1), unless the authorization is terminated or revoked sooner.  Performed at [facility], [place] [address], [place], [place] [address]   Surgical pcr screen     Status: None   Collection Time: 01/30/21  8:34 PM   Specimen: Nasal Mucosa; Nasal Swab  Result Value Ref Range Status   MRSA, PCR NEGATIVE NEGATIVE Final   Staphylococcus aureus NEGATIVE NEGATIVE Final    Comment: (NOTE) The Xpert SA Assay (FDA approved for NASAL specimens in patients 18 years of age and older), is one component of a comprehensive surveillance program. It is not intended to diagnose infection nor to guide or monitor treatment. Performed at [facility], [place] [address], [place], [place] [address]   Urine Culture     Status: None   Collection Time: 02/01/21  9:02 AM   Specimen: Urine, Random  Result Value Ref Range Status   Specimen Description URINE, RANDOM  Final   Special Requests NONE  Final   Culture   Final    NO GROWTH Performed at [facility], [place] [address], [place], [place] [address]    Report Status 02/02/2021 FINAL  Final  Culture, blood (Routine X 2) w Reflex to ID Panel     Status: None (Preliminary result)   Collection Time: 02/01/21  9:53 AM   Specimen: BLOOD  Result Value Ref Range Status   Specimen Description BLOOD RIGHT ANTECUBITAL  Final   Special Requests   Final    BOTTLES DRAWN AEROBIC ONLY Blood Culture results may not be optimal due to an inadequate volume of blood received in culture bottles   Culture   Final    NO GROWTH 3 DAYS Performed at  [facility], [place] [address] [place], [place] [address]    Report Status PENDING  Incomplete     Time coordinating discharge: 34 minutes.   SIGNED:   Hosie Poisson, MD  [facility] 02/05/2021, 10:35 AM

## 2021-02-05 NOTE — Progress Notes (Signed)
Physical Therapy Treatment Patient Details Name: Randy [name] MRN: [id number] DOB: Dec 12, 1941 Today's Date: 02/05/2021    History of Present Illness 80 y.o. 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 Deemed not to be a surgical or PCI candidate now on medical management. Pt also with RLE pain and suspected gout flare. Xray of foot without acute pathology.    PT Comments    Pt received in supine, agreeable to therapy session and with good participation and tolerance for mobility. Pt able to progress to greater than household distances with Supervision using RW and performed stair trial with up to min guard, reporting increased BLE pain with stairs but otherwise denies pain and no overt LOB. Attempted to call daughter to review HEP/safety however no answer, left voicemail. [employer] however pt deferring to use interpreter, able to communicate needs in English and follows 1-step instructions with verbal/visual demo for exercises with good tolerance as detailed below. Pt continues to benefit from PT services to progress toward functional mobility goals. Continue to recommend HHPT, anticipate pt safe to DC home once medically cleared with supervision/assist for stairs at [facility].   Follow Up Recommendations  Home health PT;Supervision for mobility/OOB     Equipment Recommendations  Rolling walker with 5" wheels    Recommendations for Other Services       Precautions / Restrictions Precautions Precautions: Fall Restrictions Weight Bearing Restrictions: No    Mobility  Bed Mobility Overal bed mobility: Modified Independent             General bed mobility comments: no physical assist needed    Transfers Overall transfer level: Needs assistance Equipment used: None;Rolling walker (2 wheeled) Transfers: Sit to/from Stand Sit to Stand: Supervision         General transfer comment: from EOB x10 reps for  strengthening no LOB, used RW for support, able to stand without UE on RW when cued from EOB  Ambulation/Gait Ambulation/Gait assistance: Supervision Gait Distance (Feet): 200 Feet Assistive device: Rolling walker (2 wheeled) Gait Pattern/deviations: Step-through pattern;Decreased stride length Gait velocity: decreased   General Gait Details: initial cues for body orientation with RW with visual demo, fair carryover; speed grossly 0.4-0.5 m/s   Stairs Stairs: Yes Stairs assistance: Min guard;Supervision Stair Management: One rail Left;Alternating pattern;Step to pattern Number of Stairs: 10 General stair comments: pt ascended/descended flight of steps with reciprocal pattern to ascend and step-to pattern to descend, reviewed sequencing for pain relief however pt reporting pain is bilateral LE today; no LOB, min guard to descend due to antalgic steps   Wheelchair Mobility    Modified Rankin (Stroke Patients Only)       Balance Overall balance assessment: Needs assistance Sitting-balance support: Feet supported Sitting balance-Leahy Scale: Normal     Standing balance support: During functional activity;Bilateral upper extremity supported Standing balance-Leahy Scale: Fair Standing balance comment: able to stand at bedside without UE briefly/to fix mask and only light UE reliance on RW during gait                            Cognition Arousal/Alertness: Awake/alert Behavior During Therapy: WFL for tasks assessed/performed Overall Cognitive Status: Difficult to assess                                 General Comments: Pt seems to be  perseverating on needing to call his daughter, pt refusing to use interpreter when called. Difficult to assess cognition due to non English speaking;oriented x4 and participatory for mobility tasks      Exercises General Exercises - Lower Extremity Ankle Circles/Pumps: AROM;Both;15 reps;Seated Long Arc Quad:  AROM;Both;10 reps;Seated Hip Flexion/Marching: AROM;Both;10 reps;Standing Mini-Sqauts: AROM;Both;10 reps;Standing Other Exercises Other Exercises: BLE AROM: standing hamstring curls x10    General Comments General comments (skin integrity, edema, etc.): HR 60 bpm resting and 72 bpm with mobility      Pertinent Vitals/Pain Pain Assessment: Faces Faces Pain Scale: Hurts a little bit Pain Location: BLE with steps Pain Descriptors / Indicators: Grimacing Pain Intervention(s): Monitored during session;Repositioned (cues for step sequencing but pt reports pain is bilateral)    Home Living                      Prior Function            PT Goals (current goals can now be found in the care plan section) Acute Rehab PT Goals Patient Stated Goal: go home PT Goal Formulation: With patient Time For Goal Achievement: 02/17/21 Potential to Achieve Goals: Good Progress towards PT goals: Progressing toward goals    Frequency    Min 3X/week      PT Plan Current plan remains appropriate    Co-evaluation              AM-PAC PT "6 Clicks" Mobility   Outcome Measure  Help needed turning from your back to your side while in a flat bed without using bedrails?: None Help needed moving from lying on your back to sitting on the side of a flat bed without using bedrails?: None Help needed moving to and from a bed to a chair (including a wheelchair)?: A Little Help needed standing up from a chair using your arms (e.g., wheelchair or bedside chair)?: A Little Help needed to walk in hospital room?: A Little Help needed climbing 3-5 steps with a railing? : A Little 6 Click Score: 20    End of Session Equipment Utilized During Treatment: Gait belt Activity Tolerance: Patient tolerated treatment well Patient left: Other (comment) (in care of OT ambulating back to room) Nurse Communication: Mobility status PT Visit Diagnosis: Unsteadiness on feet (R26.81);Pain Pain -  Right/Left:  (both) Pain - part of body: Leg (feet/knees?)     Time: 1112-1140 PT Time Calculation (min) (ACUTE ONLY): 28 min  Charges:  $Gait Training: 8-22 mins $Therapeutic Exercise: 8-22 mins                     Zoella Roberti P., PTA Acute Rehabilitation Services Pager: [phone] Office: [place] 02/05/2021, 11:53 AM

## 2021-02-05 NOTE — Progress Notes (Signed)
If patient can tolerate Flomax he should be started on this.  We will set him up for catheter removal early next week in clinic.

## 2021-02-05 NOTE — Discharge Instructions (Signed)
Foley Catheter Care, Adult A soft, flexible tube (Foley catheter) has been placed in your bladder. This may be done to temporarily help with urine drainage after an operation or to relieve blockage from an enlarged prostate gland. HOME CARE INSTRUCTIONS  If you are going home with a Foley catheter in place, follow these instructions: Taking Care of the Catheter: Keep the area where the catheter leaves your body clean. Attach the catheter to the leg so there is no tension on the catheter. Keep the drainage bag below the level of the bladder, but keep it OFF the floor. Do not take long soaking baths.  Wash your hands before touching ANYTHING related to the catheter or bag. Using mild soap and warm water on a washcloth: Clean the area closest to the catheter insertion site using a circular motion around the catheter. Clean the catheter itself by wiping AWAY from the insertion site for several inches down the tube. NEVER wipe upward as this could sweep bacteria up into the urethra (tube in your body that normally drains the bladder) and cause infection. Taking Care of the Drainage Bags: Two drainage bags will be taken home: a large overnight drainage bag, and a smaller leg bag which fits underneath clothing. It is okay to wear the overnight bag at any time, but NEVER wear the smaller leg bag at night. Keep the drainage bag well below the level of your bladder. This prevents backflow of urine into the bladder and allows the urine to drain freely. Anchor the tubing to your leg to prevent pulling or tension on the catheter. Use tape or a leg strap provided by the hospital. Empty the drainage bag when it is  to  full. Wash your hands before and after touching the bag. Periodically check the tubing for kinks to make sure there is no pressure on the tubing which could restrict the flow of urine. Changing the Drainage Bags: Cleanse both ends of the clean bag with alcohol before changing. Pinch off the  rubber catheter to avoid urine spillage during the disconnection. Disconnect the dirty bag and connect the clean one. Empty the dirty bag carefully to avoid a urine spill. Attach the new bag to the leg with tape or a leg strap. Cleaning the Drainage Bags: Whenever a drainage bag is disconnected, it must be cleaned quickly so it is ready for the next use. Wash the bag in warm, soapy water. Rinse the bag thoroughly with warm water. Soak the bag for 30 minutes in a solution of white vinegar and water (1 cup vinegar to 1 quart warm water). Rinse with warm water. SEEK MEDICAL CARE IF:  Some pain develops in the kidney (lower back) area. The urine is cloudy or smells bad. There is some blood in the urine. The catheter becomes clogged and/or there is no urine drainage. SEEK IMMEDIATE MEDICAL CARE IF:  You have moderate or severe pain in the kidney region. You start to throw up (vomit). Blood fills the tube. Worsening belly (abdominal) pain develops. You have a fever. MAKE SURE YOU:  Understand these instructions. Will watch your condition. Will get help right away if you are not doing well or get worse.  Call [facility] if you have any questions or concerns: [phone]    

## 2021-02-06 LAB — GLUCOSE, CAPILLARY
Glucose-Capillary: 169 mg/dL — ABNORMAL HIGH (ref 70–99)
Glucose-Capillary: 268 mg/dL — ABNORMAL HIGH (ref 70–99)

## 2021-02-06 LAB — CULTURE, BLOOD (ROUTINE X 2): Culture: NO GROWTH

## 2021-02-06 NOTE — Progress Notes (Signed)
Patient seen and examined at bedside. No issues overnight. No chest pain. Questions answered. Patient is awaiting transportation for discharge. Continues to be stable for discharge home.  BP (!) 129/50   Pulse 74   Temp 98 F (36.7 C) (Oral)   Resp 19   Ht 5\' 9"  (1.753 m)   Wt 69.8 kg   SpO2 98%   BMI 22.72 kg/m   General exam: Appears calm and comfortable Respiratory system: Clear to auscultation. Respiratory effort normal. Cardiovascular system: S1 & S2 heard, RRR. No murmurs, rubs, gallops or clicks.  Cordelia Poche, MD [facility] 02/06/2021, 10:18 AM

## 2021-02-06 NOTE — Plan of Care (Signed)
  Problem: Education: Goal: Knowledge of General Education information will improve Description: Including pain rating scale, medication(s)/side effects and non-pharmacologic comfort measures Outcome: Completed/Met   Problem: Health Behavior/Discharge Planning: Goal: Ability to manage health-related needs will improve Outcome: Completed/Met   Problem: Clinical Measurements: Goal: Ability to maintain clinical measurements within normal limits will improve Outcome: Completed/Met Goal: Will remain free from infection Outcome: Completed/Met Goal: Diagnostic test results will improve Outcome: Completed/Met Goal: Respiratory complications will improve Outcome: Completed/Met Goal: Cardiovascular complication will be avoided Outcome: Completed/Met   Problem: Activity: Goal: Risk for activity intolerance will decrease Outcome: Completed/Met   Problem: Nutrition: Goal: Adequate nutrition will be maintained Outcome: Completed/Met   Problem: Coping: Goal: Level of anxiety will decrease Outcome: Completed/Met   Problem: Elimination: Goal: Will not experience complications related to bowel motility Outcome: Completed/Met Goal: Will not experience complications related to urinary retention Outcome: Completed/Met   Problem: Pain Managment: Goal: General experience of comfort will improve Outcome: Completed/Met   Problem: Safety: Goal: Ability to remain free from injury will improve Outcome: Completed/Met   Problem: Skin Integrity: Goal: Risk for impaired skin integrity will decrease Outcome: Completed/Met   Problem: Safety: Goal: Non-violent Restraint(s) Outcome: Completed/Met   Problem: Safety: Goal: Non-violent Restraint(s) Outcome: Completed/Met

## 2021-02-06 NOTE — Progress Notes (Addendum)
Inpatient Diabetes Program Recommendations  AACE/ADA: New Consensus Statement on Inpatient Glycemic Control (2015)  Target Ranges:  Prepandial:   less than 140 mg/dL      Peak postprandial:   less than 180 mg/dL (1-2 hours)      Critically ill patients:  140 - 180 mg/dL   Lab Results  Component Value Date   GLUCAP 169 (H) 02/06/2021   HGBA1C 8.1 (H) 01/29/2021    Review of Glycemic Control Results for Randy [name], Randy [name] (MRN [id number]) as of 02/06/2021 10:46  Ref. Range 02/05/2021 07:44 02/05/2021 11:35 02/05/2021 16:11 02/05/2021 21:08 02/06/2021 07:30  Glucose-Capillary Latest Ref Range: 70 - 99 mg/dL 184 (H) 178 (H) 247 (H) 264 (H) 169 (H)   Diabetes history: DM 2 Outpatient Diabetes medications: Metformin Current orders for Inpatient glycemic control:  Novolog 0-15 units tid  hs  Inpatient Diabetes Program Recommendations:    - consider adding Novolog 2 units tid meal coverage if eating >50% of meals  Thanks,  Tama Headings RN, MSN, BC-ADM Inpatient Diabetes Coordinator Team Pager [phone] (8a-5p)

## 2021-02-07 ENCOUNTER — Encounter (HOSPITAL_COMMUNITY): Payer: Self-pay | Admitting: Cardiology

## 2021-02-15 NOTE — Progress Notes (Deleted)
Cardiology Clinic Note   Patient Name: Randy [name] Date of Encounter: 02/15/2021  Primary Care Provider:  Jilda Panda, MD Primary Cardiologist:  Donato Heinz, MD  Patient Profile    Randy [name] 80 year old male presents to the clinic today status post NSTEMI and new onset systolic heart failure.  Past Medical History    Past Medical History:  Diagnosis Date  . Hearing loss   . HTN (hypertension)    Past Surgical History:  Procedure Laterality Date  . EYE SURGERY    . IABP INSERTION N/A 01/30/2021   Procedure: IABP Insertion;  Surgeon: Leonie Man, MD;  Location: [facility];  Service: Cardiovascular;  Laterality: N/A;  . RIGHT/LEFT HEART CATH AND CORONARY ANGIOGRAPHY N/A 01/30/2021   Procedure: RIGHT/LEFT HEART CATH AND CORONARY ANGIOGRAPHY;  Surgeon: Leonie Man, MD;  Location: [facility];  Service: Cardiovascular;  Laterality: N/A;    Allergies  No Known Allergies  History of Present Illness    Randy [name] was admitted to the hospital on 01/29/21 with NSTEMI and new onset systolic heart failure. He underwent cardiac catheterization on 01/30/2021 which showed elevated LVEDP consistent with mild pulmonary hypertension, normal LV function, proximal-mid RCA 50%, distal RCA 99% with distal RPL collaterals filling left to right, are PDA percent stenosis. Ostial LM 50% stenosis, distal LM-ostial LAD 60% stenosed with 80% stenosed sidebranch. Mid LAD lesion 90% with 40% sidebranch and second diagonal. He was not a candidate for CABG and no good targets for PCI were noted. DAPT was recommended for 1 year. His Lipitor and carvedilol were continued. He was discharged in stable condition on 02/06/2021.  His PMH also includes acute systolic CHF EF 29-79%, CKD stage IV, hyperlipidemia, and hypertension.  He presents the clinic today for follow-up evaluation states***  *** denies chest pain, shortness of breath, lower extremity edema, fatigue,  palpitations, melena, hematuria, hemoptysis, diaphoresis, weakness, presyncope, syncope, orthopnea, and PND.   Home Medications    Prior to Admission medications   Medication Sig Start Date End Date Taking? Authorizing Provider  aspirin 81 MG chewable tablet Chew 1 tablet (81 mg total) by mouth daily. 02/06/21   Hosie Poisson, MD  atorvastatin (LIPITOR) 80 MG tablet Take 1 tablet (80 mg total) by mouth daily. 02/06/21   Hosie Poisson, MD  carvedilol (COREG) 6.25 MG tablet Take 1 tablet (6.25 mg total) by mouth 2 (two) times daily with a meal. 02/05/21   Hosie Poisson, MD  clopidogrel (PLAVIX) 75 MG tablet Take 1 tablet (75 mg total) by mouth daily. 02/06/21   Hosie Poisson, MD  furosemide (LASIX) 20 MG tablet Take 1 tablet (20 mg total) by mouth daily. 02/05/21   Hosie Poisson, MD  isosorbide-hydrALAZINE (BIDIL) 20-37.5 MG tablet Take 1 tablet by mouth 3 (three) times daily. 02/05/21   Hosie Poisson, MD  nitroGLYCERIN (NITROSTAT) 0.4 MG SL tablet Place 1 tablet (0.4 mg total) under the tongue every 5 (five) minutes x 3 doses as needed for chest pain. 02/05/21   Hosie Poisson, MD  pantoprazole (PROTONIX) 40 MG tablet Take 1 tablet (40 mg total) by mouth daily at 6 (six) AM. 02/06/21   Hosie Poisson, MD  tamsulosin (FLOMAX) 0.4 MG CAPS capsule Take 1 capsule (0.4 mg total) by mouth daily after supper. 02/05/21   Hosie Poisson, MD    Family History    No family history on file. has no family status information on file.   Social History    Social History  Socioeconomic History  . Marital status: Married    Spouse name: Not on file  . Number of children: Not on file  . Years of education: Not on file  . Highest education level: Not on file  Occupational History  . Not on file  Tobacco Use  . Smoking status: Former Smoker    Types: Cigarettes  . Smokeless tobacco: Not on file  Substance and Sexual Activity  . Alcohol use: Not on file  . Drug use: Not on file  . Sexual activity: Not on  file  Other Topics Concern  . Not on file  Social History Narrative   ** Merged History Encounter **       Social Determinants of Health   Financial Resource Strain: Not on file  Food Insecurity: Not on file  Transportation Needs: Not on file  Physical Activity: Not on file  Stress: Not on file  Social Connections: Not on file  Intimate Partner Violence: Not on file     Review of Systems    General:  No chills, fever, night sweats or weight changes.  Cardiovascular:  No chest pain, dyspnea on exertion, edema, orthopnea, palpitations, paroxysmal nocturnal dyspnea. Dermatological: No rash, lesions/masses Respiratory: No cough, dyspnea Urologic: No hematuria, dysuria Abdominal:   No nausea, vomiting, diarrhea, bright red blood per rectum, melena, or hematemesis Neurologic:  No visual changes, wkns, changes in mental status. All other systems reviewed and are otherwise negative except as noted above.  Physical Exam    VS:  There were no vitals taken for this visit. , BMI There is no height or weight on file to calculate BMI. GEN: Well nourished, well developed, in no acute distress. HEENT: normal. Neck: Supple, no JVD, carotid bruits, or masses. Cardiac: RRR, no murmurs, rubs, or gallops. No clubbing, cyanosis, edema.  Radials/DP/PT 2+ and equal bilaterally.  Respiratory:  Respirations regular and unlabored, clear to auscultation bilaterally. GI: Soft, nontender, nondistended, BS + x 4. MS: no deformity or atrophy. Skin: warm and dry, no rash. Neuro:  Strength and sensation are intact. Psych: Normal affect.  Accessory Clinical Findings    Recent Labs: 01/28/2021: B Natriuretic Peptide 643.2 01/29/2021: TSH 1.851 01/30/2021: ALT 31; Magnesium 1.8 02/04/2021: BUN 50; Creatinine, Ser 2.29; Hemoglobin 10.7; Platelets 604; Potassium 4.0; Sodium 135   Recent Lipid Panel    Component Value Date/Time   CHOL 211 (H) 01/29/2021 0352   TRIG 71 01/29/2021 0352   HDL 68 01/29/2021  0352   CHOLHDL 3.1 01/29/2021 0352   VLDL 14 01/29/2021 0352   LDLCALC 129 (H) 01/29/2021 0352    ECG personally reviewed by me today- *** - No acute changes  Echocardiogram 01/29/2021 IMPRESSIONS    1. Akinesis of the inferolateral wall; hypokinesis of the inferior wall;  overall moderate LV dysfunction.  2. Left ventricular ejection fraction, by estimation, is 35 to 40%. The  left ventricle has moderately decreased function. The left ventricle  demonstrates regional wall motion abnormalities (see scoring  diagram/findings for description). There is  moderate left ventricular hypertrophy. Left ventricular diastolic  parameters are consistent with Grade I diastolic dysfunction (impaired  relaxation).  3. Right ventricular systolic function is normal. The right ventricular  size is normal.  4. The mitral valve is normal in structure. Trivial mitral valve  regurgitation. No evidence of mitral stenosis.  5. The aortic valve is tricuspid. Aortic valve regurgitation is mild.  Mild aortic valve sclerosis is present, with no evidence of aortic valve  stenosis.  Cardiac catheterization 01/26/972  LV end diastolic pressure is moderately elevated.  There is no aortic valve stenosis.  Hemodynamic findings consistent with mild pulmonary hypertension. With mildly elevated LVEDP. Normal cardiac output and index by Fick  -------------------------  Prox RCA to Mid RCA lesion is 50% stenosed.  Dist RCA lesion is 99% (subtotal occlusion) stenosed with 90% stenosed side branch in RPAV. Distal RPL fills via left-to-right collaterals  RPDA lesion is 100% stenosed. PDA fills via septal collaterals  Ostial LM 50% stenosis. Dist LM to Ost LAD lesion is 60% stenosed with 80% stenosed side branch in Ost Cx.  Mid LAD lesion is 90% stenosed with 40% stenosed side branch in 2nd Diag.   SUMMARY  Severe Multivessel CAD:  ? Ostial LM 40 to 50%, distal LM 50 to 60% involving ostial LCx 80;    mid LAD 90% (at major septal and diagonal branches);  ? 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  CVTS consulted.  Per request, we will check a noncontrasted chest CT  Dr. Gardiner Rhyme has agreed to discussed the case with the patient's daughter who is a cardiac nurse.  With the language barrier this is very difficult discussion having he is extremely scared.  At this point I think his only real option is bypass surgery.  IV heparin was started per pharmacy protocol for IABP.  He was hemodynamically stable, chest pain-free and breathing well leave the Cath Lab.   Glenetta Hew, MD  Diagnostic Dominance: Right    Intervention     Assessment & Plan   1. NSTEMI-no chest pain today. Underwent cardiac catheterization 01/30/2021.which showed elevated LVEDP consistent with mild pulmonary hypertension, normal LV function, proximal-mid RCA 50%, distal RCA 99% with distal RPL collaterals filling left to right, are PDA percent stenosis. Ostial LM 50% stenosis, distal LM-ostial LAD 60% stenosed with 80% stenosed sidebranch. Mid LAD lesion 90% with 40% sidebranch and second diagonal. He was not a candidate for CABG and no good targets for PCI. Medical management recommended Continue carvedilol, atorvastatin, aspirin, isosorbide-hydralazine Heart healthy low-sodium diet-salty 6 given Increase physical activity as tolerated  New onset systolic CHF-no increased DOE or activity tolerance. Not a candidate for ACE or ARB Arni due to renal function. Continue isosorbide-hydralazine, furosemide Heart healthy low-sodium diet-salty 6 given Increase physical  activity as tolerated Contact office with a weight increase of 3 pounds overnight or 5 pounds in 1 week  Hyperlipidemia-01/29/2021: Cholesterol 211; HDL 68; LDL Cholesterol 129; Triglycerides 71; VLDL 14 Continue atorvastatin Heart healthy low-sodium high-fiber diet Increase physical activity as tolerated  CKD stage IV--creatinine 2.29 on 02/04/2021. Follows with PCP  Disposition: Follow-up with Dr. Gardiner Rhyme in 3 months.     Jossie Ng. Kiara Mcdowell NP-C    02/15/2021, 7:05 AM [address] Office [phone] Fax [phone]  Notice: This dictation was prepared with Dragon dictation along with smaller phrase technology. Any transcriptional errors that result from this process are unintentional and may not be corrected upon review.  I spent***minutes examining this patient, reviewing medications, and using patient centered shared decision making involving her cardiac care.  Prior to her visit I spent  greater than 20 minutes reviewing her past medical history,  medications, and prior cardiac tests.

## 2021-02-18 ENCOUNTER — Ambulatory Visit: Payer: Medicare Other | Admitting: General Practice

## 2021-06-30 ENCOUNTER — Inpatient Hospital Stay (HOSPITAL_COMMUNITY)
Admission: EM | Admit: 2021-06-30 | Discharge: 2021-07-05 | DRG: 280 | Disposition: A | Discharge status: Home / Self Care | Payer: Medicare Other | Attending: Internal Medicine | Admitting: Internal Medicine

## 2021-06-30 ENCOUNTER — Emergency Department (HOSPITAL_COMMUNITY): Payer: Medicare Other

## 2021-06-30 DIAGNOSIS — Z20822 Contact with and (suspected) exposure to covid-19: Secondary | ICD-10-CM | POA: Diagnosis present

## 2021-06-30 DIAGNOSIS — J9601 Acute respiratory failure with hypoxia: Secondary | ICD-10-CM | POA: Diagnosis present

## 2021-06-30 DIAGNOSIS — Z7902 Long term (current) use of antithrombotics/antiplatelets: Secondary | ICD-10-CM

## 2021-06-30 DIAGNOSIS — D649 Anemia, unspecified: Secondary | ICD-10-CM

## 2021-06-30 DIAGNOSIS — I509 Heart failure, unspecified: Secondary | ICD-10-CM

## 2021-06-30 DIAGNOSIS — E782 Mixed hyperlipidemia: Secondary | ICD-10-CM | POA: Diagnosis present

## 2021-06-30 DIAGNOSIS — I130 Hypertensive heart and chronic kidney disease with heart failure and stage 1 through stage 4 chronic kidney disease, or unspecified chronic kidney disease: Secondary | ICD-10-CM | POA: Diagnosis not present

## 2021-06-30 DIAGNOSIS — E785 Hyperlipidemia, unspecified: Secondary | ICD-10-CM | POA: Diagnosis present

## 2021-06-30 DIAGNOSIS — Z7982 Long term (current) use of aspirin: Secondary | ICD-10-CM

## 2021-06-30 DIAGNOSIS — N184 Chronic kidney disease, stage 4 (severe): Secondary | ICD-10-CM | POA: Diagnosis present

## 2021-06-30 DIAGNOSIS — N1832 Chronic kidney disease, stage 3b: Secondary | ICD-10-CM | POA: Diagnosis present

## 2021-06-30 DIAGNOSIS — I5023 Acute on chronic systolic (congestive) heart failure: Secondary | ICD-10-CM | POA: Diagnosis present

## 2021-06-30 DIAGNOSIS — I214 Non-ST elevation (NSTEMI) myocardial infarction: Secondary | ICD-10-CM | POA: Diagnosis present

## 2021-06-30 DIAGNOSIS — I2510 Atherosclerotic heart disease of native coronary artery without angina pectoris: Secondary | ICD-10-CM | POA: Diagnosis present

## 2021-06-30 DIAGNOSIS — E1122 Type 2 diabetes mellitus with diabetic chronic kidney disease: Secondary | ICD-10-CM | POA: Diagnosis present

## 2021-06-30 DIAGNOSIS — I159 Secondary hypertension, unspecified: Secondary | ICD-10-CM | POA: Diagnosis present

## 2021-06-30 DIAGNOSIS — Z9981 Dependence on supplemental oxygen: Secondary | ICD-10-CM

## 2021-06-30 DIAGNOSIS — J811 Chronic pulmonary edema: Secondary | ICD-10-CM | POA: Diagnosis present

## 2021-06-30 DIAGNOSIS — R0902 Hypoxemia: Secondary | ICD-10-CM | POA: Diagnosis present

## 2021-06-30 DIAGNOSIS — N401 Enlarged prostate with lower urinary tract symptoms: Secondary | ICD-10-CM | POA: Diagnosis present

## 2021-06-30 DIAGNOSIS — R001 Bradycardia, unspecified: Secondary | ICD-10-CM

## 2021-06-30 DIAGNOSIS — R0602 Shortness of breath: Secondary | ICD-10-CM | POA: Diagnosis not present

## 2021-06-30 DIAGNOSIS — R319 Hematuria, unspecified: Secondary | ICD-10-CM

## 2021-06-30 DIAGNOSIS — R350 Frequency of micturition: Secondary | ICD-10-CM | POA: Diagnosis present

## 2021-06-30 DIAGNOSIS — Z79899 Other long term (current) drug therapy: Secondary | ICD-10-CM

## 2021-06-30 LAB — CBC WITH DIFFERENTIAL/PLATELET
Abs Immature Granulocytes: 0.03 10*3/uL (ref 0.00–0.07)
Basophils Absolute: 0.1 10*3/uL (ref 0.0–0.1)
Basophils Relative: 1 %
Eosinophils Absolute: 0.2 10*3/uL (ref 0.0–0.5)
Eosinophils Relative: 3 %
HCT: 41.9 % (ref 39.0–52.0)
Hemoglobin: 12.5 g/dL — ABNORMAL LOW (ref 13.0–17.0)
Immature Granulocytes: 0 %
Lymphocytes Relative: 19 %
Lymphs Abs: 1.7 10*3/uL (ref 0.7–4.0)
MCH: 24 pg — ABNORMAL LOW (ref 26.0–34.0)
MCHC: 29.8 g/dL — ABNORMAL LOW (ref 30.0–36.0)
MCV: 80.4 fL (ref 80.0–100.0)
Monocytes Absolute: 0.9 10*3/uL (ref 0.1–1.0)
Monocytes Relative: 10 %
Neutro Abs: 6.3 10*3/uL (ref 1.7–7.7)
Neutrophils Relative %: 67 %
Platelets: 566 10*3/uL — ABNORMAL HIGH (ref 150–400)
RBC: 5.21 MIL/uL (ref 4.22–5.81)
RDW: 14.8 % (ref 11.5–15.5)
WBC: 9.3 10*3/uL (ref 4.0–10.5)
nRBC: 0 % (ref 0.0–0.2)

## 2021-06-30 LAB — I-STAT VENOUS BLOOD GAS, ED
Acid-Base Excess: 1 mmol/L (ref 0.0–2.0)
Bicarbonate: 26.9 mmol/L (ref 20.0–28.0)
Calcium, Ion: 1.02 mmol/L — ABNORMAL LOW (ref 1.15–1.40)
HCT: 42 % (ref 39.0–52.0)
Hemoglobin: 14.3 g/dL (ref 13.0–17.0)
O2 Saturation: 100 %
Potassium: 6.4 mmol/L (ref 3.5–5.1)
Sodium: 136 mmol/L (ref 135–145)
TCO2: 28 mmol/L (ref 22–32)
pCO2, Ven: 48.3 mmHg (ref 44.0–60.0)
pH, Ven: 7.353 (ref 7.250–7.430)
pO2, Ven: 192 mmHg — ABNORMAL HIGH (ref 32.0–45.0)

## 2021-06-30 LAB — RESP PANEL BY RT-PCR (FLU A&B, COVID) ARPGX2
Influenza A by PCR: NEGATIVE
Influenza B by PCR: NEGATIVE
SARS Coronavirus 2 by RT PCR: NEGATIVE

## 2021-06-30 LAB — BRAIN NATRIURETIC PEPTIDE: B Natriuretic Peptide: 1687.3 pg/mL — ABNORMAL HIGH (ref 0.0–100.0)

## 2021-06-30 LAB — I-STAT CHEM 8, ED
BUN: 46 mg/dL — ABNORMAL HIGH (ref 8–23)
Calcium, Ion: 1.04 mmol/L — ABNORMAL LOW (ref 1.15–1.40)
Chloride: 105 mmol/L (ref 98–111)
Creatinine, Ser: 1.7 mg/dL — ABNORMAL HIGH (ref 0.61–1.24)
Glucose, Bld: 198 mg/dL — ABNORMAL HIGH (ref 70–99)
HCT: 44 % (ref 39.0–52.0)
Hemoglobin: 15 g/dL (ref 13.0–17.0)
Potassium: 6.4 mmol/L (ref 3.5–5.1)
Sodium: 137 mmol/L (ref 135–145)
TCO2: 25 mmol/L (ref 22–32)

## 2021-06-30 LAB — TROPONIN I (HIGH SENSITIVITY): Troponin I (High Sensitivity): 1344 ng/L (ref <18)

## 2021-06-30 MED ORDER — FUROSEMIDE 10 MG/ML IJ SOLN
60.0000 mg | Freq: Once | INTRAMUSCULAR | Status: AC
  Administered 2021-06-30: 60 mg via INTRAVENOUS
  Filled 2021-06-30: qty 6

## 2021-06-30 MED ORDER — SODIUM ZIRCONIUM CYCLOSILICATE 10 G PO PACK
10.0000 g | PACK | Freq: Once | ORAL | Status: AC
  Administered 2021-06-30: 10 g via ORAL
  Filled 2021-06-30: qty 1

## 2021-06-30 NOTE — ED Provider Notes (Signed)
[facility] EMERGENCY DEPARTMENT Provider Note   CSN: [id number] Arrival date & time: 06/30/21  2227     History Chief Complaint  Patient presents with   Chest Pain   Shortness of Breath    Sahib Bulluck is a 80 y.o. male.  The history is provided by the patient and a relative.  Shortness of Breath Severity:  Severe Onset quality:  Gradual Duration:  2 days Timing:  Constant Progression:  Worsening Chronicity:  Recurrent Context: activity (and rest)   Relieved by:  Oxygen Worsened by:  Exertion Associated symptoms: chest pain   Associated symptoms: no abdominal pain, no cough, no ear pain, no fever, no rash, no sore throat, no sputum production and no vomiting   Risk factors: no hx of PE/DVT       Past Medical History:  Diagnosis Date   Hearing loss    HTN (hypertension)     Patient Active Problem List   Diagnosis Date Noted   Acute pulmonary edema (HCC)    AKI (acute kidney injury) ([place])    Non-STEMI (non-ST elevated myocardial infarction) ([place])    Renal insufficiency 01/29/2021   Hypertensive crisis 01/29/2021   New onset of congestive heart failure ([place]) 01/29/2021   Acute respiratory failure with hypoxia ([place]) 01/29/2021    Past Surgical History:  Procedure Laterality Date   EYE SURGERY     IABP INSERTION N/A 01/30/2021   Procedure: IABP Insertion;  Surgeon: Leonie Man, MD;  Location: [facility];  Service: Cardiovascular;  Laterality: N/A;   RIGHT/LEFT HEART CATH AND CORONARY ANGIOGRAPHY N/A 01/30/2021   Procedure: RIGHT/LEFT HEART CATH AND CORONARY ANGIOGRAPHY;  Surgeon: Leonie Man, MD;  Location: [facility];  Service: Cardiovascular;  Laterality: N/A;       No family history on file.  Social History   Tobacco Use   Smoking status: Former    Pack years: 0.00    Types: Cigarettes    Home Medications Prior to Admission medications   Medication Sig Start Date End Date Taking? Authorizing  Provider  aspirin 81 MG chewable tablet Chew 1 tablet (81 mg total) by mouth daily. 02/06/21   Hosie Poisson, MD  atorvastatin (LIPITOR) 80 MG tablet Take 1 tablet (80 mg total) by mouth daily. 02/06/21   Hosie Poisson, MD  carvedilol (COREG) 6.25 MG tablet Take 1 tablet (6.25 mg total) by mouth 2 (two) times daily with a meal. 02/05/21   Hosie Poisson, MD  clopidogrel (PLAVIX) 75 MG tablet Take 1 tablet (75 mg total) by mouth daily. 02/06/21   Hosie Poisson, MD  furosemide (LASIX) 20 MG tablet Take 1 tablet (20 mg total) by mouth daily. 02/05/21   Hosie Poisson, MD  isosorbide-hydrALAZINE (BIDIL) 20-37.5 MG tablet Take 1 tablet by mouth 3 (three) times daily. 02/05/21   Hosie Poisson, MD  nitroGLYCERIN (NITROSTAT) 0.4 MG SL tablet Place 1 tablet (0.4 mg total) under the tongue every 5 (five) minutes x 3 doses as needed for chest pain. 02/05/21   Hosie Poisson, MD  pantoprazole (PROTONIX) 40 MG tablet Take 1 tablet (40 mg total) by mouth daily at 6 (six) AM. 02/06/21   Hosie Poisson, MD  tamsulosin (FLOMAX) 0.4 MG CAPS capsule Take 1 capsule (0.4 mg total) by mouth daily after supper. 02/05/21   Hosie Poisson, MD    Allergies    Patient has no known allergies.  Review of Systems   Review of Systems  Constitutional:  Negative for  chills and fever.  HENT:  Negative for ear pain and sore throat.   Eyes:  Negative for pain and visual disturbance.  Respiratory:  Positive for shortness of breath. Negative for cough and sputum production.   Cardiovascular:  Positive for chest pain. Negative for palpitations.  Gastrointestinal:  Negative for abdominal pain and vomiting.  Genitourinary:  Negative for dysuria and hematuria.  Musculoskeletal:  Negative for arthralgias and back pain.  Skin:  Negative for color change and rash.  Neurological:  Negative for seizures and syncope.  All other systems reviewed and are negative.  Physical Exam Updated Vital Signs  ED Triage Vitals  Enc Vitals Group     BP  06/30/21 2232 (!) 160/81     Pulse Rate 06/30/21 2231 73     Resp 06/30/21 2231 (!) 29     Temp 06/30/21 2230 98.1 F (36.7 C)     Temp Source 06/30/21 2230 Oral     SpO2 06/30/21 2227 100 %     Weight --      Height --      Head Circumference --      Peak Flow --      Pain Score --      Pain Loc --      Pain Edu? --      Excl. in [place]? --      Physical Exam Vitals and nursing note reviewed.  Constitutional:      General: He is not in acute distress.    Appearance: He is well-developed. He is not ill-appearing.  HENT:     Head: Normocephalic and atraumatic.  Eyes:     Extraocular Movements: Extraocular movements intact.     Conjunctiva/sclera: Conjunctivae normal.     Pupils: Pupils are equal, round, and reactive to light.  Cardiovascular:     Rate and Rhythm: Normal rate and regular rhythm.     Pulses:          Radial pulses are 2+ on the right side and 2+ on the left side.     Heart sounds: Normal heart sounds. No murmur heard. Pulmonary:     Effort: Tachypnea present. No respiratory distress.     Breath sounds: Decreased breath sounds and rales present.  Abdominal:     Palpations: Abdomen is soft.     Tenderness: There is no abdominal tenderness.  Musculoskeletal:     Cervical back: Normal range of motion and neck supple.     Right lower leg: Edema (trace) present.     Left lower leg: Edema (trace) present.  Skin:    General: Skin is warm and dry.     Capillary Refill: Capillary refill takes less than 2 seconds.  Neurological:     General: No focal deficit present.     Mental Status: He is alert.    ED Results / Procedures / Treatments   Labs (all labs ordered are listed, but only abnormal results are displayed) Labs Reviewed  CBC WITH DIFFERENTIAL/PLATELET - Abnormal; Notable for the following components:      Result Value   Hemoglobin 12.5 (*)    MCH 24.0 (*)    MCHC 29.8 (*)    Platelets 566 (*)    All other components within normal limits  BRAIN  NATRIURETIC PEPTIDE - Abnormal; Notable for the following components:   B Natriuretic Peptide 1,687.3 (*)    All other components within normal limits  I-STAT CHEM 8, ED - Abnormal; Notable for the following  components:   Potassium 6.4 (*)    BUN 46 (*)    Creatinine, Ser 1.70 (*)    Glucose, Bld 198 (*)    Calcium, Ion 1.04 (*)    All other components within normal limits  I-STAT VENOUS BLOOD GAS, ED - Abnormal; Notable for the following components:   pO2, Ven 192.0 (*)    Potassium 6.4 (*)    Calcium, Ion 1.02 (*)    All other components within normal limits  RESP PANEL BY RT-PCR (FLU A&B, COVID) ARPGX2  URINALYSIS, ROUTINE W REFLEX MICROSCOPIC  TROPONIN I (HIGH SENSITIVITY)    EKG EKG Interpretation  Date/Time:  Sunday June 30 2021 22:29:13 EDT Ventricular Rate:  75 PR Interval:  223 QRS Duration: 113 QT Interval:  404 QTC Calculation: 452 R Axis:   90 Text Interpretation: Sinus rhythm Borderline prolonged PR interval Left posterior fascicular block LVH with secondary repolarization abnormality Confirmed by Lennice Sites ([id number]) on 06/30/2021 10:30:42 PM  Radiology DG Chest Port 1 View  Result Date: 06/30/2021 CLINICAL DATA:  Shortness of breath and chest pain, initial encounter EXAM: PORTABLE CHEST 1 VIEW COMPARISON:  02/01/2021 FINDINGS: Cardiac shadow is stable. Aortic calcifications are again seen and stable. Diffuse increased central vascular congestion is noted with mild interstitial edema. No sizable effusion is seen. No focal infiltrate is noted. IMPRESSION: Diffuse increased vascular congestion and edema consistent with CHF. Electronically Signed   By: Inez Catalina M.D.   On: 06/30/2021 22:57    Procedures Procedures   Medications Ordered in ED Medications  sodium zirconium cyclosilicate (LOKELMA) packet 10 g (10 g Oral Given 06/30/21 2326)  furosemide (LASIX) injection 60 mg (60 mg Intravenous Given 06/30/21 2326)    ED Course  I have reviewed the triage vital  signs and the nursing notes.  Pertinent labs & imaging results that were available during my care of the patient were reviewed by me and considered in my medical decision making (see chart for details).    MDM Rules/Calculators/A&P                          Azarias Derks is a 80 year old male with history of heart failure, CAD who presents the ED with shortness of breath.  History somewhat limited by issues with interpreter.  Was able to use family member as an interpreter as well as patient does speak some [place].  I gather that he has been short of breath for the last several days.  Got worse today while driving.  Maybe there is concern for some noncompliance with his medications.  He has some chest pain as well.  EKG shows sinus rhythm.  He has some T wave inversions laterally that are unchanged from prior.  He came in on nonrebreather with EMS but was able to titrate him down to 2 L of oxygen.  He did desat to the upper 80s.  He is tachypneic.  But seems to be improving on 2 L of oxygen.  Overall suspect volume overload.  May be ACS.  I-STAT shows elevated potassium but suspect hemolysis.  EKG does not show any hyperkalemic changes.  However given that he appears volume overloaded on exam and on chest x-ray will give a dose of IV Lasix as well as Lokelma until we wait for BMP potassium.  Gas showed normal pH.  No hypercarbia.  Overall suspect heart failure exacerbation and anticipate admission.  Awaiting troponin, BNP, CMP.  Handed off to oncoming  ED staff pending remaining lab work, anticipate admit to medicine.  This chart was dictated using voice recognition software.  Despite best efforts to proofread,  errors can occur which can change the documentation meaning.   Diagnosis:  Acute respiratory failure with hypoxia (HCC)  Acute on chronic congestive heart failure, unspecified heart failure type [employer])   Rx / DC Orders ED Discharge Orders     None        Lennice Sites,  DO 06/30/21 2341

## 2021-06-30 NOTE — ED Notes (Signed)
Informed Dr. Ronnald Nian of pt's I-stat Chem 8 (potassium) and I-stat venous blood gas (potassium) elevated results.

## 2021-06-30 NOTE — ED Provider Notes (Signed)
11:40 PM Assumed care from Dr. Ronnald Nian, please see their note for full history, physical and decision making until this point. In brief this is a 80 y.o. year old male who presented to the ED tonight with Chest Pain and Shortness of Breath     Resp dsitress. Weaned to 2L [place], likely chf. Also Hyper-K pending labs, reeval for stability and admission.   BNP elevated.  His potassium on repeat was 4.0 I suspect this is probably more accurate the the other was likely hemolyzed.  Troponin came back above the thousand.  Patient without any chest pain at this time.  He is little tachypneic but no respiratory distress on 2 L of oxygen.  No active chest pain.  Heparin and nitroglycerin started.  I discussed with cardiology who agrees with plan and will admit.  CRITICAL CARE Performed by: Merrily Pew Total critical care time: 35 minutes Critical care time was exclusive of separately billable procedures and treating other patients. Critical care was necessary to treat or prevent imminent or life-threatening deterioration. Critical care was time spent personally by me on the following activities: development of treatment plan with patient and/or surrogate as well as nursing, discussions with consultants, evaluation of patient's response to treatment, examination of patient, obtaining history from patient or surrogate, ordering and performing treatments and interventions, ordering and review of laboratory studies, ordering and review of radiographic studies, pulse oximetry and re-evaluation of patient's condition.   Labs, studies and imaging reviewed by myself and considered in medical decision making if ordered. Imaging interpreted by radiology.  Labs Reviewed  CBC WITH DIFFERENTIAL/PLATELET - Abnormal; Notable for the following components:      Result Value   Hemoglobin 12.5 (*)    MCH 24.0 (*)    MCHC 29.8 (*)    Platelets 566 (*)    All other components within normal limits  BRAIN NATRIURETIC PEPTIDE  - Abnormal; Notable for the following components:   B Natriuretic Peptide 1,687.3 (*)    All other components within normal limits  I-STAT CHEM 8, ED - Abnormal; Notable for the following components:   Potassium 6.4 (*)    BUN 46 (*)    Creatinine, Ser 1.70 (*)    Glucose, Bld 198 (*)    Calcium, Ion 1.04 (*)    All other components within normal limits  I-STAT VENOUS BLOOD GAS, ED - Abnormal; Notable for the following components:   pO2, Ven 192.0 (*)    Potassium 6.4 (*)    Calcium, Ion 1.02 (*)    All other components within normal limits  RESP PANEL BY RT-PCR (FLU A&B, COVID) ARPGX2  URINALYSIS, ROUTINE W REFLEX MICROSCOPIC  TROPONIN I (HIGH SENSITIVITY)    DG Chest Port 1 View  Final Result      No follow-ups on file.    Joclyn Alsobrook, Corene Cornea, MD 07/01/21 [phone]

## 2021-06-30 NOTE — ED Triage Notes (Signed)
Pt arrives via [facility] for cp and shortness of breath, pt was driving and pulled over and called 911. Pt speaks [place], EMS unable to obtain hx. Upon Ems arrival, pt had labored respirations, 82% on RA, pt grabbing at chest. EMS gave 324mg  asa, 1 SL nitro.   18g LAC 170/90 85 HR 20 RR CBG 228 100% NRB 12L

## 2021-07-01 ENCOUNTER — Inpatient Hospital Stay (HOSPITAL_COMMUNITY): Payer: Medicare Other

## 2021-07-01 ENCOUNTER — Encounter (HOSPITAL_COMMUNITY): Payer: Self-pay | Admitting: Cardiology

## 2021-07-01 DIAGNOSIS — I10 Essential (primary) hypertension: Secondary | ICD-10-CM | POA: Insufficient documentation

## 2021-07-01 DIAGNOSIS — Z7982 Long term (current) use of aspirin: Secondary | ICD-10-CM | POA: Diagnosis not present

## 2021-07-01 DIAGNOSIS — E785 Hyperlipidemia, unspecified: Secondary | ICD-10-CM | POA: Diagnosis present

## 2021-07-01 DIAGNOSIS — N401 Enlarged prostate with lower urinary tract symptoms: Secondary | ICD-10-CM | POA: Diagnosis present

## 2021-07-01 DIAGNOSIS — I130 Hypertensive heart and chronic kidney disease with heart failure and stage 1 through stage 4 chronic kidney disease, or unspecified chronic kidney disease: Secondary | ICD-10-CM | POA: Diagnosis present

## 2021-07-01 DIAGNOSIS — Z7902 Long term (current) use of antithrombotics/antiplatelets: Secondary | ICD-10-CM | POA: Diagnosis not present

## 2021-07-01 DIAGNOSIS — N184 Chronic kidney disease, stage 4 (severe): Secondary | ICD-10-CM | POA: Diagnosis present

## 2021-07-01 DIAGNOSIS — I5021 Acute systolic (congestive) heart failure: Secondary | ICD-10-CM

## 2021-07-01 DIAGNOSIS — Z20822 Contact with and (suspected) exposure to covid-19: Secondary | ICD-10-CM | POA: Diagnosis present

## 2021-07-01 DIAGNOSIS — I2510 Atherosclerotic heart disease of native coronary artery without angina pectoris: Secondary | ICD-10-CM | POA: Diagnosis present

## 2021-07-01 DIAGNOSIS — I214 Non-ST elevation (NSTEMI) myocardial infarction: Secondary | ICD-10-CM | POA: Diagnosis present

## 2021-07-01 DIAGNOSIS — I509 Heart failure, unspecified: Secondary | ICD-10-CM | POA: Diagnosis not present

## 2021-07-01 DIAGNOSIS — R350 Frequency of micturition: Secondary | ICD-10-CM | POA: Diagnosis present

## 2021-07-01 DIAGNOSIS — I159 Secondary hypertension, unspecified: Secondary | ICD-10-CM | POA: Diagnosis present

## 2021-07-01 DIAGNOSIS — Z79899 Other long term (current) drug therapy: Secondary | ICD-10-CM | POA: Diagnosis not present

## 2021-07-01 DIAGNOSIS — R319 Hematuria, unspecified: Secondary | ICD-10-CM | POA: Diagnosis not present

## 2021-07-01 DIAGNOSIS — N1832 Chronic kidney disease, stage 3b: Secondary | ICD-10-CM | POA: Diagnosis present

## 2021-07-01 DIAGNOSIS — N138 Other obstructive and reflux uropathy: Secondary | ICD-10-CM | POA: Diagnosis not present

## 2021-07-01 DIAGNOSIS — R0902 Hypoxemia: Secondary | ICD-10-CM | POA: Diagnosis present

## 2021-07-01 DIAGNOSIS — J9601 Acute respiratory failure with hypoxia: Secondary | ICD-10-CM | POA: Diagnosis present

## 2021-07-01 DIAGNOSIS — I5023 Acute on chronic systolic (congestive) heart failure: Secondary | ICD-10-CM | POA: Diagnosis present

## 2021-07-01 DIAGNOSIS — E1122 Type 2 diabetes mellitus with diabetic chronic kidney disease: Secondary | ICD-10-CM | POA: Diagnosis present

## 2021-07-01 DIAGNOSIS — E782 Mixed hyperlipidemia: Secondary | ICD-10-CM | POA: Diagnosis present

## 2021-07-01 DIAGNOSIS — R0602 Shortness of breath: Secondary | ICD-10-CM | POA: Diagnosis present

## 2021-07-01 DIAGNOSIS — Z9981 Dependence on supplemental oxygen: Secondary | ICD-10-CM | POA: Diagnosis not present

## 2021-07-01 HISTORY — DX: Atherosclerotic heart disease of native coronary artery without angina pectoris: I25.10

## 2021-07-01 HISTORY — DX: Hypoxemia: R09.02

## 2021-07-01 HISTORY — DX: Acute on chronic systolic (congestive) heart failure: I50.23

## 2021-07-01 LAB — ECHOCARDIOGRAM COMPLETE
Area-P 1/2: 3.37 cm2
Calc EF: 33.4 %
Height: 69 in
S' Lateral: 3.8 cm
Single Plane A2C EF: 37.4 %
Single Plane A4C EF: 31.1 %
Weight: 2448 oz

## 2021-07-01 LAB — BASIC METABOLIC PANEL
Anion gap: 13 (ref 5–15)
BUN: 27 mg/dL — ABNORMAL HIGH (ref 8–23)
CO2: 23 mmol/L (ref 22–32)
Calcium: 9 mg/dL (ref 8.9–10.3)
Chloride: 102 mmol/L (ref 98–111)
Creatinine, Ser: 1.65 mg/dL — ABNORMAL HIGH (ref 0.61–1.24)
GFR, Estimated: 41 mL/min — ABNORMAL LOW (ref >60)
Glucose, Bld: 136 mg/dL — ABNORMAL HIGH (ref 70–99)
Potassium: 3.4 mmol/L — ABNORMAL LOW (ref 3.5–5.1)
Sodium: 138 mmol/L (ref 135–145)

## 2021-07-01 LAB — CBC
HCT: 41.2 % (ref 39.0–52.0)
Hemoglobin: 12.2 g/dL — ABNORMAL LOW (ref 13.0–17.0)
MCH: 23.5 pg — ABNORMAL LOW (ref 26.0–34.0)
MCHC: 29.6 g/dL — ABNORMAL LOW (ref 30.0–36.0)
MCV: 79.2 fL — ABNORMAL LOW (ref 80.0–100.0)
Platelets: 631 10*3/uL — ABNORMAL HIGH (ref 150–400)
RBC: 5.2 MIL/uL (ref 4.22–5.81)
RDW: 14.9 % (ref 11.5–15.5)
WBC: 10.6 10*3/uL — ABNORMAL HIGH (ref 4.0–10.5)
nRBC: 0 % (ref 0.0–0.2)

## 2021-07-01 LAB — COMPREHENSIVE METABOLIC PANEL
ALT: 15 U/L (ref 0–44)
AST: 30 U/L (ref 15–41)
Albumin: 3.1 g/dL — ABNORMAL LOW (ref 3.5–5.0)
Alkaline Phosphatase: 56 U/L (ref 38–126)
Anion gap: 8 (ref 5–15)
BUN: 28 mg/dL — ABNORMAL HIGH (ref 8–23)
CO2: 25 mmol/L (ref 22–32)
Calcium: 8.5 mg/dL — ABNORMAL LOW (ref 8.9–10.3)
Chloride: 106 mmol/L (ref 98–111)
Creatinine, Ser: 1.69 mg/dL — ABNORMAL HIGH (ref 0.61–1.24)
GFR, Estimated: 40 mL/min — ABNORMAL LOW (ref >60)
Glucose, Bld: 168 mg/dL — ABNORMAL HIGH (ref 70–99)
Potassium: 4 mmol/L (ref 3.5–5.1)
Sodium: 139 mmol/L (ref 135–145)
Total Bilirubin: 0.6 mg/dL (ref 0.3–1.2)
Total Protein: 6.4 g/dL — ABNORMAL LOW (ref 6.5–8.1)

## 2021-07-01 LAB — TROPONIN I (HIGH SENSITIVITY)
Troponin I (High Sensitivity): 1671 ng/L (ref <18)
Troponin I (High Sensitivity): 2306 ng/L (ref <18)

## 2021-07-01 LAB — MAGNESIUM: Magnesium: 1.8 mg/dL (ref 1.7–2.4)

## 2021-07-01 LAB — HEPARIN LEVEL (UNFRACTIONATED): Heparin Unfractionated: 0.31 IU/mL (ref 0.30–0.70)

## 2021-07-01 MED ORDER — HEPARIN (PORCINE) 25000 UT/250ML-% IV SOLN
950.0000 [IU]/h | INTRAVENOUS | Status: DC
  Administered 2021-07-01 – 2021-07-02 (×2): 950 [IU]/h via INTRAVENOUS
  Filled 2021-07-01: qty 250

## 2021-07-01 MED ORDER — CLOPIDOGREL BISULFATE 75 MG PO TABS
75.0000 mg | ORAL_TABLET | Freq: Every day | ORAL | Status: DC
  Administered 2021-07-01: 75 mg via ORAL
  Filled 2021-07-01: qty 1

## 2021-07-01 MED ORDER — ACETAMINOPHEN 325 MG PO TABS: 650.0000 mg | ORAL_TABLET | ORAL | Status: DC | PRN

## 2021-07-01 MED ORDER — HEPARIN BOLUS VIA INFUSION
3000.0000 [IU] | Freq: Once | INTRAVENOUS | Status: AC
  Administered 2021-07-01: 3000 [IU] via INTRAVENOUS
  Filled 2021-07-01: qty 3000

## 2021-07-01 MED ORDER — NITROGLYCERIN IN D5W 200-5 MCG/ML-% IV SOLN
0.0000 ug/min | INTRAVENOUS | Status: DC
  Administered 2021-07-01: 5 ug/min via INTRAVENOUS
  Filled 2021-07-01: qty 250

## 2021-07-01 MED ORDER — NITROGLYCERIN 0.4 MG SL SUBL: 0.4000 mg | SUBLINGUAL_TABLET | SUBLINGUAL | Status: DC | PRN

## 2021-07-01 MED ORDER — ONDANSETRON HCL 4 MG/2ML IJ SOLN: 4.0000 mg | Freq: Four times a day (QID) | INTRAMUSCULAR | Status: DC | PRN

## 2021-07-01 MED ORDER — CARVEDILOL 12.5 MG PO TABS
12.5000 mg | ORAL_TABLET | Freq: Two times a day (BID) | ORAL | Status: DC
  Administered 2021-07-01: 12.5 mg via ORAL
  Filled 2021-07-01 (×2): qty 4

## 2021-07-01 MED ORDER — HEPARIN (PORCINE) 25000 UT/250ML-% IV SOLN
900.0000 [IU]/h | INTRAVENOUS | Status: DC
  Administered 2021-07-01: 900 [IU]/h via INTRAVENOUS
  Filled 2021-07-01: qty 250

## 2021-07-01 MED ORDER — TAMSULOSIN HCL 0.4 MG PO CAPS
0.4000 mg | ORAL_CAPSULE | Freq: Every day | ORAL | Status: DC
  Administered 2021-07-01 – 2021-07-04 (×4): 0.4 mg via ORAL
  Filled 2021-07-01 (×4): qty 1

## 2021-07-01 MED ORDER — FUROSEMIDE 10 MG/ML IJ SOLN
80.0000 mg | Freq: Two times a day (BID) | INTRAMUSCULAR | Status: DC
  Administered 2021-07-01 (×2): 80 mg via INTRAVENOUS
  Filled 2021-07-01 (×3): qty 8

## 2021-07-01 MED ORDER — ATORVASTATIN CALCIUM 80 MG PO TABS
80.0000 mg | ORAL_TABLET | Freq: Every day | ORAL | Status: DC
  Administered 2021-07-01 – 2021-07-05 (×5): 80 mg via ORAL
  Filled 2021-07-01 (×5): qty 1

## 2021-07-01 MED ORDER — ASPIRIN 81 MG PO CHEW
81.0000 mg | CHEWABLE_TABLET | Freq: Every day | ORAL | Status: DC
  Administered 2021-07-01 – 2021-07-05 (×5): 81 mg via ORAL
  Filled 2021-07-01 (×5): qty 1

## 2021-07-01 MED ORDER — CARVEDILOL 3.125 MG PO TABS: 6.2500 mg | ORAL_TABLET | Freq: Two times a day (BID) | ORAL | Status: DC

## 2021-07-01 MED ORDER — CHLORHEXIDINE GLUCONATE CLOTH 2 % EX PADS
6.0000 | MEDICATED_PAD | Freq: Every day | CUTANEOUS | Status: DC
  Administered 2021-07-02: 6 via TOPICAL

## 2021-07-01 MED ORDER — PANTOPRAZOLE SODIUM 40 MG PO TBEC
40.0000 mg | DELAYED_RELEASE_TABLET | Freq: Every day | ORAL | Status: DC
  Administered 2021-07-01 – 2021-07-05 (×5): 40 mg via ORAL
  Filled 2021-07-01 (×5): qty 1

## 2021-07-01 MED ORDER — ISOSORB DINITRATE-HYDRALAZINE 20-37.5 MG PO TABS
1.0000 | ORAL_TABLET | Freq: Three times a day (TID) | ORAL | Status: DC
  Administered 2021-07-01 – 2021-07-05 (×13): 1 via ORAL
  Filled 2021-07-01 (×14): qty 1

## 2021-07-01 NOTE — Progress Notes (Signed)
DAILY PROGRESS NOTE   Patient Name: Randy [name] Date of Encounter: 07/01/2021 Cardiologist: Donato Heinz, MD  Chief Complaint   No complaints  Patient Profile   Randy [name] is a 80 y.o. 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  Subjective   Patient seen in the ER today.  Use the telemedicine translation services as he speaks [place].  Tried to reach his daughter who is a Marine scientist but she was not available.  As per the interpreter he he is not complaining of chest pain or shortness of breath today.  Was noted to have some difficulty voiding overnight and improved with in and out catheterization.  He currently has a Foley catheter placed with blood-tinged urine.  He is on IV heparin and nitroglycerin.  Blood pressure remains elevated.  Objective   Vitals:   07/01/21 0615 07/01/21 0630 07/01/21 0645 07/01/21 0730  BP: (!) 144/78 135/73 140/79 136/84  Pulse: 70 (!) 56 (!) 55 65  Resp:   18 18  Temp:      TempSrc:      SpO2: 99% 98% 97% 97%  Weight:      Height:        Intake/Output Summary (Last 24 hours) at 07/01/2021 3007 Last data filed at 07/01/2021 0341 Gross per 24 hour  Intake --  Output 1300 ml  Net -1300 ml   Filed Weights   07/01/21 0015  Weight: 69.4 kg    Physical Exam   General appearance: alert and no distress Neck: JVD - 5 cm above sternal notch, no carotid bruit, and thyroid not enlarged, symmetric, no tenderness/mass/nodules Lungs: diminished breath sounds bibasilar Heart: regular rate and rhythm, S1, S2 normal, no murmur, click, rub or gallop Abdomen: soft, non-tender; bowel sounds normal; no masses,  no organomegaly Extremities: extremities normal, atraumatic, no cyanosis or  edema Pulses: 2+ and symmetric Skin: Skin color, texture, turgor normal. No rashes or lesions Neurologic: Grossly normal Psych: Pleasant  Inpatient Medications    Scheduled Meds:  aspirin  81 mg Oral Daily   atorvastatin  80 mg Oral Daily   carvedilol  12.5 mg Oral BID WC   clopidogrel  75 mg Oral Daily   furosemide  80 mg Intravenous Q12H   isosorbide-hydrALAZINE  1 tablet Oral TID   pantoprazole  40 mg Oral Q0600   tamsulosin  0.4 mg Oral QPC supper    Continuous Infusions:  heparin 900 Units/hr (07/01/21 0056)   nitroGLYCERIN 35 mcg/min (07/01/21 0340)    PRN Meds: acetaminophen, nitroGLYCERIN, ondansetron (ZOFRAN) IV   Labs   Results for orders placed or performed during the hospital encounter of 06/30/21 (from the past 48 hour(s))  Resp Panel by RT-PCR (Flu A&B, Covid) Nasopharyngeal Swab     Status: None   Collection Time: 06/30/21 10:26 PM   Specimen: Nasopharyngeal Swab; Nasopharyngeal(NP) swabs in vial transport medium  Result Value Ref Range   SARS Coronavirus 2 by RT PCR NEGATIVE NEGATIVE    Comment: (NOTE) SARS-CoV-2 target nucleic acids are NOT DETECTED.  The SARS-CoV-2 RNA is generally detectable in upper respiratory specimens during the acute phase of infection. The lowest concentration of SARS-CoV-2 viral copies this assay can detect is 138 copies/mL. A negative result does  not preclude SARS-Cov-2 infection and should not be used as the sole basis for treatment or other patient management decisions. A negative result may occur with  improper specimen collection/handling, submission of specimen other than nasopharyngeal swab, presence of viral mutation(s) within the areas targeted by this assay, and inadequate number of viral copies(<138 copies/mL). A negative result must be combined with clinical observations, patient history, and epidemiological information. The expected result is Negative.  Fact Sheet for Patients:   EntrepreneurPulse.com.au  Fact Sheet for Healthcare Providers:  IncredibleEmployment.be  This test is no t yet approved or cleared by the [place] FDA and  has been authorized for detection and/or diagnosis of SARS-CoV-2 by FDA under an Emergency Use Authorization (EUA). This EUA will remain  in effect (meaning this test can be used) for the duration of the COVID-19 declaration under Section 564(b)(1) of the Act, 21 U.S.C.section 360bbb-3(b)(1), unless the authorization is terminated  or revoked sooner.       Influenza A by PCR NEGATIVE NEGATIVE   Influenza B by PCR NEGATIVE NEGATIVE    Comment: (NOTE) The Xpert Xpress SARS-CoV-2/FLU/RSV plus assay is intended as an aid in the diagnosis of influenza from Nasopharyngeal swab specimens and should not be used as a sole basis for treatment. Nasal washings and aspirates are unacceptable for Xpert Xpress SARS-CoV-2/FLU/RSV testing.  Fact Sheet for Patients: EntrepreneurPulse.com.au  Fact Sheet for Healthcare Providers: IncredibleEmployment.be  This test is not yet approved or cleared by the [place] FDA and has been authorized for detection and/or diagnosis of SARS-CoV-2 by FDA under an Emergency Use Authorization (EUA). This EUA will remain in effect (meaning this test can be used) for the duration of the COVID-19 declaration under Section 564(b)(1) of the Act, 21 U.S.C. section 360bbb-3(b)(1), unless the authorization is terminated or revoked.  Performed at [facility], [place] [address] [place], [place] [address]   CBC WITH DIFFERENTIAL     Status: Abnormal   Collection Time: 06/30/21 10:29 PM  Result Value Ref Range   WBC 9.3 4.0 - 10.5 K/uL   RBC 5.21 4.22 - 5.81 MIL/uL   Hemoglobin 12.5 (L) 13.0 - 17.0 g/dL   HCT 41.9 39.0 - 52.0 %   MCV 80.4 80.0 - 100.0 fL   MCH 24.0 (L) 26.0 - 34.0 pg   MCHC 29.8 (L) 30.0 - 36.0 g/dL   RDW 14.8  11.5 - 15.5 %   Platelets 566 (H) 150 - 400 K/uL   nRBC 0.0 0.0 - 0.2 %   Neutrophils Relative % 67 %   Neutro Abs 6.3 1.7 - 7.7 K/uL   Lymphocytes Relative 19 %   Lymphs Abs 1.7 0.7 - 4.0 K/uL   Monocytes Relative 10 %   Monocytes Absolute 0.9 0.1 - 1.0 K/uL   Eosinophils Relative 3 %   Eosinophils Absolute 0.2 0.0 - 0.5 K/uL   Basophils Relative 1 %   Basophils Absolute 0.1 0.0 - 0.1 K/uL   Immature Granulocytes 0 %   Abs Immature Granulocytes 0.03 0.00 - 0.07 K/uL    Comment: Performed at [facility] [address] [place], [place] [address]  Troponin I (High Sensitivity)     Status: Abnormal   Collection Time: 06/30/21 10:29 PM  Result Value Ref Range   Troponin I (High Sensitivity) 1,344 (HH) <18 ng/L    Comment: CRITICAL RESULT CALLED TO, READ BACK BY AND VERIFIED WITH:  Adan Sis, RN, 2355, 06/30/21, ADEDOKUNE (NOTE) Elevated high sensitivity troponin  I (hsTnI) values and significant  changes across serial measurements may suggest ACS but many other  chronic and acute conditions are known to elevate hsTnI results.  Refer to the Links section for chest pain algorithms and additional  guidance. Performed at [facility], [place] [address] [place], [place] [address]   Brain natriuretic peptide     Status: Abnormal   Collection Time: 06/30/21 10:30 PM  Result Value Ref Range   B Natriuretic Peptide 1,687.3 (H) 0.0 - 100.0 pg/mL    Comment: Performed at [facility] [address], [place], [place] [address]  I-Stat Chem 8, ED ([facility], [facility], [facility] only)     Status: Abnormal   Collection Time: 06/30/21 10:37 PM  Result Value Ref Range   Sodium 137 135 - 145 mmol/L   Potassium 6.4 (HH) 3.5 - 5.1 mmol/L   Chloride 105 98 - 111 mmol/L   BUN 46 (H) 8 - 23 mg/dL   Creatinine, Ser 1.70 (H) 0.61 - 1.24 mg/dL   Glucose, Bld 198 (H) 70 - 99 mg/dL    Comment: Glucose reference range applies only to samples taken after fasting for at least 8 hours.   Calcium, Ion  1.04 (L) 1.15 - 1.40 mmol/L   TCO2 25 22 - 32 mmol/L   Hemoglobin 15.0 13.0 - 17.0 g/dL   HCT 44.0 39.0 - 52.0 %   Comment NOTIFIED PHYSICIAN   I-Stat venous blood gas, [facility] ED)     Status: Abnormal   Collection Time: 06/30/21 10:50 PM  Result Value Ref Range   pH, Ven 7.353 7.250 - 7.430   pCO2, Ven 48.3 44.0 - 60.0 mmHg   pO2, Ven 192.0 (H) 32.0 - 45.0 mmHg   Bicarbonate 26.9 20.0 - 28.0 mmol/L   TCO2 28 22 - 32 mmol/L   O2 Saturation 100.0 %   Acid-Base Excess 1.0 0.0 - 2.0 mmol/L   Sodium 136 135 - 145 mmol/L   Potassium 6.4 (HH) 3.5 - 5.1 mmol/L   Calcium, Ion 1.02 (L) 1.15 - 1.40 mmol/L   HCT 42.0 39.0 - 52.0 %   Hemoglobin 14.3 13.0 - 17.0 g/dL   Sample type VENOUS    Comment NOTIFIED PHYSICIAN   Comprehensive metabolic panel     Status: Abnormal   Collection Time: 06/30/21 11:45 PM  Result Value Ref Range   Sodium 139 135 - 145 mmol/L   Potassium 4.0 3.5 - 5.1 mmol/L    Comment: DELTA CHECK NOTED CORRECTED ON 07/11 AT 0029: PREVIOUSLY REPORTED AS 4.0 DIALYSIS    Chloride 106 98 - 111 mmol/L   CO2 25 22 - 32 mmol/L   Glucose, Bld 168 (H) 70 - 99 mg/dL    Comment: Glucose reference range applies only to samples taken after fasting for at least 8 hours.   BUN 28 (H) 8 - 23 mg/dL   Creatinine, Ser 1.69 (H) 0.61 - 1.24 mg/dL   Calcium 8.5 (L) 8.9 - 10.3 mg/dL   Total Protein 6.4 (L) 6.5 - 8.1 g/dL   Albumin 3.1 (L) 3.5 - 5.0 g/dL   AST 30 15 - 41 U/L   ALT 15 0 - 44 U/L   Alkaline Phosphatase 56 38 - 126 U/L   Total Bilirubin 0.6 0.3 - 1.2 mg/dL   GFR, Estimated 40 (L) >60 mL/min    Comment: (NOTE) Calculated using the CKD-EPI Creatinine Equation (2021)    Anion gap 8 5 - 15    Comment: Performed  at [facility], [place] [address], [place], [place] [address]  Troponin I (High Sensitivity)     Status: Abnormal   Collection Time: 07/01/21 12:23 AM  Result Value Ref Range   Troponin I (High Sensitivity) 1,671 (HH) <18 ng/L    Comment: CRITICAL VALUE NOTED.   VALUE IS CONSISTENT WITH PREVIOUSLY REPORTED AND CALLED VALUE. (NOTE) Elevated high sensitivity troponin I (hsTnI) values and significant  changes across serial measurements may suggest ACS but many other  chronic and acute conditions are known to elevate hsTnI results.  Refer to the Links section for chest pain algorithms and additional  guidance. Performed at [facility], [place] [address], [place], [place] [address]   Magnesium     Status: None   Collection Time: 07/01/21  5:10 AM  Result Value Ref Range   Magnesium 1.8 1.7 - 2.4 mg/dL    Comment: Performed at [facility] [address], [place], [place] [address]  Basic metabolic panel     Status: Abnormal   Collection Time: 07/01/21  5:10 AM  Result Value Ref Range   Sodium 138 135 - 145 mmol/L   Potassium 3.4 (L) 3.5 - 5.1 mmol/L   Chloride 102 98 - 111 mmol/L   CO2 23 22 - 32 mmol/L   Glucose, Bld 136 (H) 70 - 99 mg/dL    Comment: Glucose reference range applies only to samples taken after fasting for at least 8 hours.   BUN 27 (H) 8 - 23 mg/dL   Creatinine, Ser 1.65 (H) 0.61 - 1.24 mg/dL   Calcium 9.0 8.9 - 10.3 mg/dL   GFR, Estimated 41 (L) >60 mL/min    Comment: (NOTE) Calculated using the CKD-EPI Creatinine Equation (2021)    Anion gap 13 5 - 15    Comment: Performed at [facility] [address], [place], [place] [address]  CBC     Status: Abnormal   Collection Time: 07/01/21  5:10 AM  Result Value Ref Range   WBC 10.6 (H) 4.0 - 10.5 K/uL   RBC 5.20 4.22 - 5.81 MIL/uL   Hemoglobin 12.2 (L) 13.0 - 17.0 g/dL   HCT 41.2 39.0 - 52.0 %   MCV 79.2 (L) 80.0 - 100.0 fL   MCH 23.5 (L) 26.0 - 34.0 pg   MCHC 29.6 (L) 30.0 - 36.0 g/dL   RDW 14.9 11.5 - 15.5 %   Platelets 631 (H) 150 - 400 K/uL   nRBC 0.0 0.0 - 0.2 %    Comment: Performed at [facility] [address], [place], [place] [address]  Troponin I (High Sensitivity)     Status: Abnormal   Collection Time: 07/01/21  6:30 AM  Result  Value Ref Range   Troponin I (High Sensitivity) 2,306 (HH) <18 ng/L    Comment: CRITICAL VALUE NOTED.  VALUE IS CONSISTENT WITH PREVIOUSLY REPORTED AND CALLED VALUE. (NOTE) Elevated high sensitivity troponin I (hsTnI) values and significant  changes across serial measurements may suggest ACS but many other  chronic and acute conditions are known to elevate hsTnI results.  Refer to the Links section for chest pain algorithms and additional  guidance. Performed at [facility], [place] [address], [place], [place] [address]     ECG   Sinus rhythm at 57, anterior ST segment elevation, LVH by voltage- Personally Reviewed  Telemetry   Sinus rhythm- Personally Reviewed  Radiology    DG Chest [facility]  Result Date: 06/30/2021 CLINICAL DATA:  Shortness of breath and chest pain, initial encounter EXAM: PORTABLE CHEST 1 VIEW COMPARISON:  02/01/2021 FINDINGS: Cardiac shadow is stable. Aortic calcifications are again seen and stable. Diffuse increased central vascular congestion is noted with mild interstitial edema. No sizable effusion is seen. No focal infiltrate is noted. IMPRESSION: Diffuse increased vascular congestion and edema consistent with CHF. Electronically Signed   By: Inez Catalina M.D.   On: 06/30/2021 22:57    Cardiac Studies   Echo pending  Assessment   Active Problems:   Pulmonary edema   NSTEMI (non-ST elevated myocardial infarction) (HCC)   Acute on chronic HFrEF (heart failure with reduced ejection fraction) (HCC)   Hypoxia   CAD, multiple vessel   CHF (congestive heart failure) ([place])   Plan   Randy [name] reports improvement in his shortness of breath today.  He had some concerns about a vaccine that he got and some "electric shock" that he received, not clear what this meant.  It does appear that he has presented with heart failure and has a known reduced ejection fraction as well as multivessel coronary disease which was not amenable to percutaneous or  surgical revascularization.  He does report compliance with medications and insist that he was taking the BiDil 3 times a day.  He is also scheduled to be on aspirin and Plavix.  He does have a Foley catheter in place with blood-tinged urine.  He was having difficulty voiding.  He is ordered for tamsulosin.  Urinalysis pending.  He is on heparin which I would recommend continuing no more than 48 hours due to non-STEMI since he is not a revascularization candidate.  We will continue IV Lasix and monitor urine output as well as renal function.  His viral respiratory panel is negative for influenza and COVID-19 therefore we will discontinue precautions.  I will try to contact his daughter later in the afternoon apparently she works at night and may be sleeping today.  Time Spent Directly with Patient:  I have spent a total of 25 minutes with the patient reviewing hospital notes, telemetry, EKGs, labs and examining the patient as well as establishing an assessment and plan that was discussed personally with the patient.  > 50% of time was spent in direct patient care.  Length of Stay:  LOS: 0 days   Pixie Casino, MD, [facility], [place] Director of the Advanced Lipid Disorders &  Cardiovascular Risk Reduction Clinic Diplomate of the American Board of Clinical Lipidology Attending Cardiologist  Direct Dial: [phone]  Fax: [phone]  Website:  [email or web address] Osgood Darwyn Ponzo 07/01/2021, 8:32 AM

## 2021-07-01 NOTE — ED Notes (Signed)
Echo at bedside

## 2021-07-01 NOTE — ED Notes (Signed)
Patient SpO2 noted to drop between 86%-88%. Upon entering the room, patient noted to be on room air. Patient placed back on 2L [place]. RN notified.

## 2021-07-01 NOTE — Progress Notes (Signed)
ANTICOAGULATION CONSULT NOTE - Initial Consult  Pharmacy Consult for heparin Indication: chest pain/ACS  No Known Allergies  Patient Measurements: Height: 5\' 9"  (175.3 cm) Weight: 69.4 kg (153 lb) (from Feb 2022 records) IBW/kg (Calculated) : 70.7  Vital Signs: Temp: 98.1 F (36.7 C) (07/10 2230) Temp Source: Temporal (07/10 2231) BP: 166/98 (07/11 0015) Pulse Rate: 67 (07/11 0015)  Labs: Recent Labs    06/30/21 2229 06/30/21 2237 06/30/21 2250 06/30/21 2345  HGB 12.5* 15.0 14.3  --   HCT 41.9 44.0 42.0  --   PLT 566*  --   --   --   CREATININE  --  1.70*  --  1.69*  TROPONINIHS 1,344*  --   --   --     Estimated Creatinine Clearance: 33.1 mL/min (A) (by C-G formula based on SCr of 1.69 mg/dL (H)).   Medical History: Past Medical History:  Diagnosis Date   Hearing loss    HTN (hypertension)     Assessment: 80yo male c/o worsening SOB, being admitted for suspected HF exacerbation, also found to have elevated troponin, to begin heparin.  Goal of Therapy:  Heparin level 0.3-0.7 units/ml Monitor platelets by anticoagulation protocol: Yes   Plan:  Heparin 3000 units IV bolus x1 followed by gtt at 900 units/hr (based on heparin requirements during previous hospitalization) and monitor heparin levels and CBC.  Wynona Neat, PharmD, BCPS  07/01/2021,12:29 AM

## 2021-07-01 NOTE — Progress Notes (Signed)
  Echocardiogram 2D Echocardiogram has been performed.  Michiel Cowboy 07/01/2021, 11:36 AM

## 2021-07-01 NOTE — Progress Notes (Signed)
ANTICOAGULATION CONSULT NOTE - Initial Consult  Pharmacy Consult for heparin Indication: chest pain/ACS  No Known Allergies  Patient Measurements: Height: 5\' 9"  (175.3 cm) Weight: 69.4 kg (153 lb) (from Feb 2022 records) IBW/kg (Calculated) : 70.7 Heparin dosing wt: 69.4 kg  Vital Signs: BP: 110/78 (07/11 1200) Pulse Rate: 57 (07/11 1200)  Labs: Recent Labs    06/30/21 2229 06/30/21 2237 06/30/21 2250 06/30/21 2345 07/01/21 0023 07/01/21 0510 07/01/21 0630 07/01/21 0845  HGB 12.5* 15.0 14.3  --   --  12.2*  --   --   HCT 41.9 44.0 42.0  --   --  41.2  --   --   PLT 566*  --   --   --   --  631*  --   --   HEPARINUNFRC  --   --   --   --   --   --   --  0.31  CREATININE  --  1.70*  --  1.69*  --  1.65*  --   --   TROPONINIHS 1,344*  --   --   --  1,671*  --  2,306*  --      Estimated Creatinine Clearance: 33.9 mL/min (A) (by C-G formula based on SCr of 1.65 mg/dL (H)).   Medical History: Past Medical History:  Diagnosis Date   Acute on chronic HFrEF (heart failure with reduced ejection fraction) ([place]) 07/01/2021   CAD, 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   Hearing loss    HTN (hypertension)    Hypoxia 07/01/2021    Assessment: 80yo male c/o worsening SOB, being admitted for suspected HF exacerbation, also found to have elevated troponin, to begin heparin.  HL 0.31 this AM  Goal of Therapy:  Heparin level 0.3-0.7 units/ml Monitor platelets by anticoagulation protocol: Yes   Plan:  Increased infusion to 950 units/hr (slight increase, no bolus) Remains to have blood tinged urine in foley, RN and MD aware -f/u HL in AM tomorrow  Joetta Manners, PharmD, [facility] Emergency Medicine Clinical Pharmacist ED RPh Phone: [place]: [phone]

## 2021-07-01 NOTE — ED Notes (Signed)
Patient had BM in bed. Full linen changed. Patient up to chair in room and placed back in bed. Meal tray given.

## 2021-07-01 NOTE — ED Notes (Signed)
Pt c/o pain and cannot fully empty bladder by self. Pt c/o same earlier with relief from in and out. Urine output of 891ml earlier see chart

## 2021-07-01 NOTE — H&P (Signed)
Cardiology Admission History and Physical:   Patient ID: Randy [name] MRN: [id number]; DOB: 1941-11-17   Admission date: 06/30/2021  PCP:  Jilda Panda, MD   [facility] Providers Cardiologist:  Donato Heinz, MD        Chief Complaint:  chest pain and SOB  Patient Profile:   Randy [name] is a 80 y.o. 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  History of Present Illness:   Randy [name] is a 80 y.o. 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  Patient speaks loation and history is limited  Per report- he was driving and started having chest pain and sob, called 911. EMS arrived saw him in laboured breathing, 82% sats, gave aspirin 324mg , 1 nitro, BP was 170/90s Initial EKG from EMS called for STEMI but was cancelled subsequently,. EKG in the ER shows NSR, LVH with ST depressions int thelateral leads  SOB going on for several days with gradual worsening and also reports every time he moves around or after eating he gets fatigued, sob and sometimes chest pain. Further history is limited  ER: initially on non rebreaather and now on 2lts oxygen, cxr pulm edema got IV lasix 80mg . BNP 1687, trop 1344 Labs- K is 4.0 (NOT 6.4 - hemolyzed sample)   Past Medical History:  Diagnosis Date   Acute on chronic HFrEF (heart failure with reduced ejection fraction) ([place]) 07/01/2021   CAD, 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   Hearing loss    HTN (hypertension)    Hypoxia  07/01/2021    Past Surgical History:  Procedure Laterality Date   EYE SURGERY     IABP INSERTION N/A 01/30/2021   Procedure: IABP Insertion;  Surgeon: Leonie Man, MD;  Location: [place] [facility];  Service: Cardiovascular;  Laterality: N/A;   RIGHT/LEFT HEART CATH AND CORONARY ANGIOGRAPHY N/A 01/30/2021   Procedure: RIGHT/LEFT HEART CATH AND CORONARY ANGIOGRAPHY;  Surgeon: Leonie Man, MD;  Location: [place] [facility];  Service: Cardiovascular;  Laterality: N/A;     Medications Prior to Admission: Prior to Admission medications   Medication Sig Start Date End Date Taking? Authorizing Provider  aspirin 81 MG chewable tablet Chew 1 tablet (81 mg total) by mouth daily. 02/06/21   Hosie Poisson, MD  atorvastatin (LIPITOR) 80 MG tablet Take 1 tablet (80 mg total) by mouth daily. 02/06/21   Hosie Poisson, MD  carvedilol (COREG) 6.25 MG tablet Take 1 tablet (6.25 mg total) by mouth 2 (two) times daily with a meal. 02/05/21   Hosie Poisson, MD  clopidogrel (PLAVIX) 75 MG tablet Take 1 tablet (75 mg total) by mouth daily. 02/06/21   Hosie Poisson, MD  furosemide (LASIX) 20 MG tablet Take 1 tablet (20 mg total) by mouth daily. 02/05/21   Hosie Poisson, MD  isosorbide-hydrALAZINE (BIDIL) 20-37.5 MG tablet Take 1 tablet by mouth 3 (three) times daily. 02/05/21   Hosie Poisson, MD  nitroGLYCERIN (NITROSTAT) 0.4 MG SL tablet Place 1 tablet (0.4 mg total)  under the tongue every 5 (five) minutes x 3 doses as needed for chest pain. 02/05/21   Hosie Poisson, MD  pantoprazole (PROTONIX) 40 MG tablet Take 1 tablet (40 mg total) by mouth daily at 6 (six) AM. 02/06/21   Hosie Poisson, MD  tamsulosin (FLOMAX) 0.4 MG CAPS capsule Take 1 capsule (0.4 mg total) by mouth daily after supper. 02/05/21   Hosie Poisson, MD     Allergies:   No Known Allergies  Social History:   Social History   Socioeconomic History   Marital status: Married    Spouse name: Not on file   Number of children: Not on file   Years of  education: Not on file   Highest education level: Not on file  Occupational History   Not on file  Tobacco Use   Smoking status: Former    Pack years: 0.00    Types: Cigarettes   Smokeless tobacco: Not on file  Substance and Sexual Activity   Alcohol use: Not on file   Drug use: Not on file   Sexual activity: Not on file  Other Topics Concern   Not on file  Social History Narrative   ** Merged History Encounter **       Social Determinants of Health   Financial Resource Strain: Not on file  Food Insecurity: Not on file  Transportation Needs: Not on file  Physical Activity: Not on file  Stress: Not on file  Social Connections: Not on file  Intimate Partner Violence: Not on file    Family History:   The patient's family history is not on file.    ROS:  Please see the history of present illness.  All other ROS reviewed and negative.     Physical Exam/Data:   Vitals:   06/30/21 2315 06/30/21 2330 07/01/21 0003 07/01/21 0015  BP: (!) 156/68 (!) 149/72 139/67 (!) 166/98  Pulse: 68 68 (!) 53 67  Resp: (!) 26 20 (!) 27 (!) 23  Temp:      TempSrc:      SpO2: 99% 100% 96% 97%  Weight:    69.4 kg  Height:    5\' 9"  (1.753 m)   No intake or output data in the 24 hours ending 07/01/21 0042 Last 3 Weights 07/01/2021 02/06/2021 02/02/2021  Weight (lbs) 153 lb 153 lb 14.1 oz 149 lb 14.6 oz  Weight (kg) 69.4 kg 69.8 kg 68 kg     Body mass index is 22.59 kg/m.  General:  Well nourished, well developed, in no acute distress HEENT: normal Lymph: no adenopathy Neck:  JVD++ Endocrine:  No thryomegaly Vascular: No carotid bruits; FA pulses 2+ bilaterally without bruits  Cardiac:  normal S1, S2; RRR; no murmur  Lungs:  b/l crackles+ Abd: soft, nontender, no hepatomegaly  Ext: 2+edema Musculoskeletal:  No deformities, BUE and BLE strength normal and equal Skin: warm and dry  Neuro:  CNs 2-12 intact, no focal abnormalities noted Psych:  Normal affect    Laboratory  Data:  High Sensitivity Troponin:   Recent Labs  Lab 06/30/21 2229  TROPONINIHS 1,344*      Chemistry Recent Labs  Lab 06/30/21 2237 06/30/21 2250 06/30/21 2345  NA 137 136 139  K 6.4* 6.4* 4.0  CL 105  --  106  CO2  --   --  25  GLUCOSE 198*  --  168*  BUN 46*  --  28*  CREATININE 1.70*  --  1.69*  CALCIUM  --   --  8.5*  GFRNONAA  --   --  40*  ANIONGAP  --   --  8    Recent Labs  Lab 06/30/21 2345  PROT 6.4*  ALBUMIN 3.1*  AST 30  ALT 15  ALKPHOS 56  BILITOT 0.6   Hematology Recent Labs  Lab 06/30/21 2229 06/30/21 2237 06/30/21 2250  WBC 9.3  --   --   RBC 5.21  --   --   HGB 12.5* 15.0 14.3  HCT 41.9 44.0 42.0  MCV 80.4  --   --   MCH 24.0*  --   --   MCHC 29.8*  --   --   RDW 14.8  --   --   PLT 566*  --   --    BNP Recent Labs  Lab 06/30/21 2230  BNP 1,687.3*    DDimer No results for input(s): DDIMER in the last 168 hours.   Radiology/Studies:  DG Chest Port 1 View  Result Date: 06/30/2021 CLINICAL DATA:  Shortness of breath and chest pain, initial encounter EXAM: PORTABLE CHEST 1 VIEW COMPARISON:  02/01/2021 FINDINGS: Cardiac shadow is stable. Aortic calcifications are again seen and stable. Diffuse increased central vascular congestion is noted with mild interstitial edema. No sizable effusion is seen. No focal infiltrate is noted. IMPRESSION: Diffuse increased vascular congestion and edema consistent with CHF. Electronically Signed   By: Inez Catalina M.D.   On: 06/30/2021 22:57      Prior Cardiac work up: ECHO: 01/29/21 IMPRESSIONS     1. Akinesis of the inferolateral wall; hypokinesis of the inferior wall;  overall moderate LV dysfunction.   2. Left ventricular ejection fraction, by estimation, is 35 to 40%. The  left ventricle has moderately decreased function. The left ventricle  demonstrates regional wall motion abnormalities (see scoring  diagram/findings for description). There is  moderate left ventricular hypertrophy. Left  ventricular diastolic  parameters are consistent with Grade I diastolic dysfunction (impaired  relaxation).   3. Right ventricular systolic function is normal. The right ventricular  size is normal.   4. The mitral valve is normal in structure. Trivial mitral valve  regurgitation. No evidence of mitral stenosis.   5. The aortic valve is tricuspid. Aortic valve regurgitation is mild.  Mild aortic valve sclerosis is present, with no evidence of aortic valve  stenosis.   LHC: 05/29/33  LV end diastolic pressure is moderately elevated. There is no aortic valve stenosis. Hemodynamic findings consistent with mild pulmonary hypertension. With mildly elevated LVEDP. Normal cardiac output and index by Fick ------------------------- Prox RCA to Mid RCA lesion is 50% stenosed. Dist RCA lesion is 99% (subtotal occlusion) stenosed with 90% stenosed side branch in RPAV. Distal RPL fills via left-to-right collaterals RPDA lesion is 100% stenosed. PDA fills via septal collaterals Ostial LM 50% stenosis. Dist LM to Ost LAD lesion is 60% stenosed with 80% stenosed side branch in Ost Cx. Mid LAD lesion is 90% stenosed with 40% stenosed side branch in 2nd Diag.   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  CVTS consulted. Per request, we will check a noncontrasted chest CT Dr. Gardiner Rhyme has agreed to discussed the case with the patient's daughter who is a cardiac nurse.  With the language barrier this is very difficult  discussion having he is extremely scared.  At this point I think his only real option is bypass surgery. IV heparin was started per pharmacy protocol for IABP.   He was hemodynamically stable, chest pain-free and breathing well leave the Cath Lab.    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  RHC with CI 3.3, PWCP 18-20.  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 He has chest pain- sec to MVD and demand ischemia. If trops flat out- can stop heparin gtt  - volume up on exam : lasix 80mg  BID, accurate urine output. - continue aspirin, statin and home medications:GDMT: DAPT (aspirin plus plavix), coreg 12.5mg  BID (consider uptitrating), bidil 1tab TID - repeat ECHO this admission - continue heparin gtt for now. Stop if trops flat - continue nitro gtt to bring BP down~ 120s range - oxygen support - continue home meds.  Full code  Risk Assessment/Risk Scores:    TIMI Risk Score for Unstable Angina or Non-ST Elevation MI:   The patient's TIMI risk score is  , which indicates a  % risk of all cause mortality, new or recurrent myocardial infarction or need for urgent revascularization in the next 14 days.  New York Heart Association (NYHA) Functional Class NYHA Class IV     Severity of Illness: The appropriate patient status for this patient is INPATIENT. Inpatient status is judged to be reasonable and necessary in  order to provide the required intensity of service to ensure the patient's safety. The patient's presenting symptoms, physical exam findings, and initial radiographic and laboratory data in the context of their chronic comorbidities is felt to place them at high risk for further clinical deterioration. Furthermore, it is not anticipated that the patient will be medically stable for discharge from the hospital within 2 midnights of admission. The following factors support the patient status of inpatient.   " The patient's presenting symptoms include chest pain, sob. " The worrisome physical exam findings include pulmonary edema. " The initial radiographic and laboratory data are worrisome because of pulmonary edema, trop elevation. " The chronic co-morbidities include htn.   * I certify that at the point of admission it is my clinical judgment that the patient will require inpatient hospital care spanning beyond 2 midnights from the point of admission due to high intensity of service, high risk for further deterioration and high frequency of surveillance required.*   For questions or updates, please contact [place] Please consult [email or web address] for contact info under     Signed, Renae Fickle, MD  07/01/2021 12:42 AM

## 2021-07-02 DIAGNOSIS — I2510 Atherosclerotic heart disease of native coronary artery without angina pectoris: Secondary | ICD-10-CM

## 2021-07-02 DIAGNOSIS — I5023 Acute on chronic systolic (congestive) heart failure: Secondary | ICD-10-CM

## 2021-07-02 DIAGNOSIS — R319 Hematuria, unspecified: Secondary | ICD-10-CM

## 2021-07-02 DIAGNOSIS — I214 Non-ST elevation (NSTEMI) myocardial infarction: Secondary | ICD-10-CM

## 2021-07-02 LAB — BASIC METABOLIC PANEL
Anion gap: 10 (ref 5–15)
BUN: 35 mg/dL — ABNORMAL HIGH (ref 8–23)
CO2: 27 mmol/L (ref 22–32)
Calcium: 9.2 mg/dL (ref 8.9–10.3)
Chloride: 99 mmol/L (ref 98–111)
Creatinine, Ser: 2.23 mg/dL — ABNORMAL HIGH (ref 0.61–1.24)
GFR, Estimated: 29 mL/min — ABNORMAL LOW (ref >60)
Glucose, Bld: 180 mg/dL — ABNORMAL HIGH (ref 70–99)
Potassium: 3.2 mmol/L — ABNORMAL LOW (ref 3.5–5.1)
Sodium: 136 mmol/L (ref 135–145)

## 2021-07-02 LAB — HEPARIN LEVEL (UNFRACTIONATED): Heparin Unfractionated: 0.33 IU/mL (ref 0.30–0.70)

## 2021-07-02 LAB — CBC
HCT: 38.9 % — ABNORMAL LOW (ref 39.0–52.0)
Hemoglobin: 12 g/dL — ABNORMAL LOW (ref 13.0–17.0)
MCH: 24 pg — ABNORMAL LOW (ref 26.0–34.0)
MCHC: 30.8 g/dL (ref 30.0–36.0)
MCV: 77.8 fL — ABNORMAL LOW (ref 80.0–100.0)
Platelets: 646 10*3/uL — ABNORMAL HIGH (ref 150–400)
RBC: 5 MIL/uL (ref 4.22–5.81)
RDW: 14.9 % (ref 11.5–15.5)
WBC: 12.8 10*3/uL — ABNORMAL HIGH (ref 4.0–10.5)
nRBC: 0 % (ref 0.0–0.2)

## 2021-07-02 LAB — MAGNESIUM: Magnesium: 1.8 mg/dL (ref 1.7–2.4)

## 2021-07-02 MED ORDER — POTASSIUM CHLORIDE CRYS ER 20 MEQ PO TBCR
40.0000 meq | EXTENDED_RELEASE_TABLET | Freq: Once | ORAL | Status: AC
  Administered 2021-07-02: 40 meq via ORAL
  Filled 2021-07-02: qty 2

## 2021-07-02 MED ORDER — MAGNESIUM SULFATE 2 GM/50ML IV SOLN
2.0000 g | Freq: Once | INTRAVENOUS | Status: AC
  Administered 2021-07-02: 2 g via INTRAVENOUS
  Filled 2021-07-02: qty 50

## 2021-07-02 MED ORDER — CARVEDILOL 6.25 MG PO TABS
6.2500 mg | ORAL_TABLET | Freq: Two times a day (BID) | ORAL | Status: DC
  Administered 2021-07-03 – 2021-07-05 (×5): 6.25 mg via ORAL
  Filled 2021-07-02 (×6): qty 1

## 2021-07-02 MED ORDER — CLOPIDOGREL BISULFATE 75 MG PO TABS
75.0000 mg | ORAL_TABLET | Freq: Every day | ORAL | Status: DC
  Administered 2021-07-02 – 2021-07-05 (×4): 75 mg via ORAL
  Filled 2021-07-02 (×4): qty 1

## 2021-07-02 NOTE — Plan of Care (Signed)
  Problem: Education: Goal: Knowledge of General Education information will improve Description Including pain rating scale, medication(s)/side effects and non-pharmacologic comfort measures Outcome: Progressing   

## 2021-07-02 NOTE — Progress Notes (Signed)
DAILY PROGRESS NOTE   Patient Name: Randy [name] Date of Encounter: 07/02/2021 Cardiologist: Donato Heinz, MD  Chief Complaint   No complaints  Patient Profile   Ted Goodner is a 80 y.o. 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  Subjective   No issues overnight- seen today with professional language interpreter via telephone. noted to have some blood tinged urine with foley in place- suspect trauma. Bradycardic this am - BB was held. Creatinine has increased to 2.23 (from 1.65). Potassium low at 3.2. Slight increase in WBC count (10.6 to 12.8) - afebrile. Echo yesterday shows stable LVEF of around 35%. He is on DAPT and heparin.  Objective   Vitals:   07/02/21 0010 07/02/21 0558 07/02/21 0747 07/02/21 0851  BP: (!) 126/54  (!) 120/54 (!) 120/51  Pulse: 63  (!) 59 (!) 52  Resp: 16  18   Temp: 98 F (36.7 C)  98.3 F (36.8 C)   TempSrc: Oral  Oral   SpO2: 97%  95% 97%  Weight:  59 kg    Height:        Intake/Output Summary (Last 24 hours) at 07/02/2021 0932 Last data filed at 07/02/2021 0600 Gross per 24 hour  Intake 595.45 ml  Output 2350 ml  Net -1754.55 ml   Filed Weights   07/01/21 0015 07/01/21 2058 07/02/21 0558  Weight: 69.4 kg 59 kg 59 kg    Physical Exam   General appearance: alert and no distress Neck: no carotid bruit and thyroid not enlarged, symmetric, no tenderness/mass/nodules Lungs: clear to auscultation bilaterally Heart: regular rate and rhythm, S1, S2 normal, no murmur, click, rub or gallop Abdomen: soft, non-tender; bowel sounds normal; no masses,  no organomegaly Extremities: extremities normal, atraumatic, no cyanosis or edema Pulses: 2+ and symmetric Skin: Skin  color, texture, turgor normal. No rashes or lesions Neurologic: Grossly normal Psych: Pleasant  Inpatient Medications    Scheduled Meds:  aspirin  81 mg Oral Daily   atorvastatin  80 mg Oral Daily   carvedilol  12.5 mg Oral BID WC   Chlorhexidine Gluconate Cloth  6 each Topical Daily   clopidogrel  75 mg Oral Daily   furosemide  80 mg Intravenous Q12H   isosorbide-hydrALAZINE  1 tablet Oral TID   pantoprazole  40 mg Oral Q0600   tamsulosin  0.4 mg Oral QPC supper    Continuous Infusions:  heparin 950 Units/hr (07/02/21 0538)   nitroGLYCERIN Stopped (07/01/21 1430)    PRN Meds: acetaminophen, nitroGLYCERIN, ondansetron (ZOFRAN) IV   Labs   Results for orders placed or performed during the hospital encounter of 06/30/21 (from the past 48 hour(s))  Resp Panel by RT-PCR (Flu A&B, Covid) Nasopharyngeal Swab     Status: None   Collection Time: 06/30/21 10:26 PM   Specimen: Nasopharyngeal Swab; Nasopharyngeal(NP) swabs in vial transport medium  Result Value Ref Range   SARS Coronavirus 2 by RT PCR NEGATIVE NEGATIVE    Comment: (NOTE) SARS-CoV-2 target nucleic acids are NOT DETECTED.  The SARS-CoV-2 RNA is generally detectable in upper respiratory specimens during the acute phase of infection. The lowest concentration of SARS-CoV-2 viral copies this assay can detect is 138 copies/mL. A negative result does not preclude SARS-Cov-2  infection and should not be used as the sole basis for treatment or other patient management decisions. A negative result may occur with  improper specimen collection/handling, submission of specimen other than nasopharyngeal swab, presence of viral mutation(s) within the areas targeted by this assay, and inadequate number of viral copies(<138 copies/mL). A negative result must be combined with clinical observations, patient history, and epidemiological information. The expected result is Negative.  Fact Sheet for Patients:   EntrepreneurPulse.com.au  Fact Sheet for Healthcare Providers:  IncredibleEmployment.be  This test is no t yet approved or cleared by the [place] FDA and  has been authorized for detection and/or diagnosis of SARS-CoV-2 by FDA under an Emergency Use Authorization (EUA). This EUA will remain  in effect (meaning this test can be used) for the duration of the COVID-19 declaration under Section 564(b)(1) of the Act, 21 U.S.C.section 360bbb-3(b)(1), unless the authorization is terminated  or revoked sooner.       Influenza A by PCR NEGATIVE NEGATIVE   Influenza B by PCR NEGATIVE NEGATIVE    Comment: (NOTE) The Xpert Xpress SARS-CoV-2/FLU/RSV plus assay is intended as an aid in the diagnosis of influenza from Nasopharyngeal swab specimens and should not be used as a sole basis for treatment. Nasal washings and aspirates are unacceptable for Xpert Xpress SARS-CoV-2/FLU/RSV testing.  Fact Sheet for Patients: EntrepreneurPulse.com.au  Fact Sheet for Healthcare Providers: IncredibleEmployment.be  This test is not yet approved or cleared by the [place] FDA and has been authorized for detection and/or diagnosis of SARS-CoV-2 by FDA under an Emergency Use Authorization (EUA). This EUA will remain in effect (meaning this test can be used) for the duration of the COVID-19 declaration under Section 564(b)(1) of the Act, 21 U.S.C. section 360bbb-3(b)(1), unless the authorization is terminated or revoked.  Performed at [facility], [place] [address], [place], [place] [address]   CBC WITH DIFFERENTIAL     Status: Abnormal   Collection Time: 06/30/21 10:29 PM  Result Value Ref Range   WBC 9.3 4.0 - 10.5 K/uL   RBC 5.21 4.22 - 5.81 MIL/uL   Hemoglobin 12.5 (L) 13.0 - 17.0 g/dL   HCT 41.9 39.0 - 52.0 %   MCV 80.4 80.0 - 100.0 fL   MCH 24.0 (L) 26.0 - 34.0 pg   MCHC 29.8 (L) 30.0 - 36.0 g/dL   RDW 14.8  11.5 - 15.5 %   Platelets 566 (H) 150 - 400 K/uL   nRBC 0.0 0.0 - 0.2 %   Neutrophils Relative % 67 %   Neutro Abs 6.3 1.7 - 7.7 K/uL   Lymphocytes Relative 19 %   Lymphs Abs 1.7 0.7 - 4.0 K/uL   Monocytes Relative 10 %   Monocytes Absolute 0.9 0.1 - 1.0 K/uL   Eosinophils Relative 3 %   Eosinophils Absolute 0.2 0.0 - 0.5 K/uL   Basophils Relative 1 %   Basophils Absolute 0.1 0.0 - 0.1 K/uL   Immature Granulocytes 0 %   Abs Immature Granulocytes 0.03 0.00 - 0.07 K/uL    Comment: Performed at [facility] [address], [place], [place] [address]  Troponin I (High Sensitivity)     Status: Abnormal   Collection Time: 06/30/21 10:29 PM  Result Value Ref Range   Troponin I (High Sensitivity) 1,344 (HH) <18 ng/L    Comment: CRITICAL RESULT CALLED TO, READ BACK BY AND VERIFIED WITH:  Adan Sis, RN, 2355, 06/30/21, ADEDOKUNE (NOTE) Elevated high sensitivity troponin I (hsTnI) values  and significant  changes across serial measurements may suggest ACS but many other  chronic and acute conditions are known to elevate hsTnI results.  Refer to the Links section for chest pain algorithms and additional  guidance. Performed at [facility], [place] [address], [place], [place] [address]   Brain natriuretic peptide     Status: Abnormal   Collection Time: 06/30/21 10:30 PM  Result Value Ref Range   B Natriuretic Peptide 1,687.3 (H) 0.0 - 100.0 pg/mL    Comment: Performed at [facility] [address], [place], [place] [address]  I-Stat Chem 8, ED ([facility], [facility], [facility] only)     Status: Abnormal   Collection Time: 06/30/21 10:37 PM  Result Value Ref Range   Sodium 137 135 - 145 mmol/L   Potassium 6.4 (HH) 3.5 - 5.1 mmol/L   Chloride 105 98 - 111 mmol/L   BUN 46 (H) 8 - 23 mg/dL   Creatinine, Ser 1.70 (H) 0.61 - 1.24 mg/dL   Glucose, Bld 198 (H) 70 - 99 mg/dL    Comment: Glucose reference range applies only to samples taken after fasting for at least 8 hours.   Calcium, Ion  1.04 (L) 1.15 - 1.40 mmol/L   TCO2 25 22 - 32 mmol/L   Hemoglobin 15.0 13.0 - 17.0 g/dL   HCT 44.0 39.0 - 52.0 %   Comment NOTIFIED PHYSICIAN   I-Stat venous blood gas, [facility] ED)     Status: Abnormal   Collection Time: 06/30/21 10:50 PM  Result Value Ref Range   pH, Ven 7.353 7.250 - 7.430   pCO2, Ven 48.3 44.0 - 60.0 mmHg   pO2, Ven 192.0 (H) 32.0 - 45.0 mmHg   Bicarbonate 26.9 20.0 - 28.0 mmol/L   TCO2 28 22 - 32 mmol/L   O2 Saturation 100.0 %   Acid-Base Excess 1.0 0.0 - 2.0 mmol/L   Sodium 136 135 - 145 mmol/L   Potassium 6.4 (HH) 3.5 - 5.1 mmol/L   Calcium, Ion 1.02 (L) 1.15 - 1.40 mmol/L   HCT 42.0 39.0 - 52.0 %   Hemoglobin 14.3 13.0 - 17.0 g/dL   Sample type VENOUS    Comment NOTIFIED PHYSICIAN   Comprehensive metabolic panel     Status: Abnormal   Collection Time: 06/30/21 11:45 PM  Result Value Ref Range   Sodium 139 135 - 145 mmol/L   Potassium 4.0 3.5 - 5.1 mmol/L    Comment: DELTA CHECK NOTED CORRECTED ON 07/11 AT 0029: PREVIOUSLY REPORTED AS 4.0 DIALYSIS    Chloride 106 98 - 111 mmol/L   CO2 25 22 - 32 mmol/L   Glucose, Bld 168 (H) 70 - 99 mg/dL    Comment: Glucose reference range applies only to samples taken after fasting for at least 8 hours.   BUN 28 (H) 8 - 23 mg/dL   Creatinine, Ser 1.69 (H) 0.61 - 1.24 mg/dL   Calcium 8.5 (L) 8.9 - 10.3 mg/dL   Total Protein 6.4 (L) 6.5 - 8.1 g/dL   Albumin 3.1 (L) 3.5 - 5.0 g/dL   AST 30 15 - 41 U/L   ALT 15 0 - 44 U/L   Alkaline Phosphatase 56 38 - 126 U/L   Total Bilirubin 0.6 0.3 - 1.2 mg/dL   GFR, Estimated 40 (L) >60 mL/min    Comment: (NOTE) Calculated using the CKD-EPI Creatinine Equation (2021)    Anion gap 8 5 - 15    Comment: Performed at [facility], [place] [address], [place], [place] [address]  Troponin I (High Sensitivity)     Status: Abnormal   Collection Time: 07/01/21 12:23 AM  Result Value Ref Range   Troponin I (High Sensitivity) 1,671 (HH) <18 ng/L    Comment: CRITICAL VALUE NOTED.   VALUE IS CONSISTENT WITH PREVIOUSLY REPORTED AND CALLED VALUE. (NOTE) Elevated high sensitivity troponin I (hsTnI) values and significant  changes across serial measurements may suggest ACS but many other  chronic and acute conditions are known to elevate hsTnI results.  Refer to the Links section for chest pain algorithms and additional  guidance. Performed at [facility], [place] [address] [place], [place] [address]   Magnesium     Status: None   Collection Time: 07/01/21  5:10 AM  Result Value Ref Range   Magnesium 1.8 1.7 - 2.4 mg/dL    Comment: Performed at [facility] [address] [place], [place] [address]  Basic metabolic panel     Status: Abnormal   Collection Time: 07/01/21  5:10 AM  Result Value Ref Range   Sodium 138 135 - 145 mmol/L   Potassium 3.4 (L) 3.5 - 5.1 mmol/L   Chloride 102 98 - 111 mmol/L   CO2 23 22 - 32 mmol/L   Glucose, Bld 136 (H) 70 - 99 mg/dL    Comment: Glucose reference range applies only to samples taken after fasting for at least 8 hours.   BUN 27 (H) 8 - 23 mg/dL   Creatinine, Ser 1.65 (H) 0.61 - 1.24 mg/dL   Calcium 9.0 8.9 - 10.3 mg/dL   GFR, Estimated 41 (L) >60 mL/min    Comment: (NOTE) Calculated using the CKD-EPI Creatinine Equation (2021)    Anion gap 13 5 - 15    Comment: Performed at [facility] [address] [place], [place] [address]  CBC     Status: Abnormal   Collection Time: 07/01/21  5:10 AM  Result Value Ref Range   WBC 10.6 (H) 4.0 - 10.5 K/uL   RBC 5.20 4.22 - 5.81 MIL/uL   Hemoglobin 12.2 (L) 13.0 - 17.0 g/dL   HCT 41.2 39.0 - 52.0 %   MCV 79.2 (L) 80.0 - 100.0 fL   MCH 23.5 (L) 26.0 - 34.0 pg   MCHC 29.6 (L) 30.0 - 36.0 g/dL   RDW 14.9 11.5 - 15.5 %   Platelets 631 (H) 150 - 400 K/uL   nRBC 0.0 0.0 - 0.2 %    Comment: Performed at [facility] [address] [place], [place] [address]  Troponin I (High Sensitivity)     Status: Abnormal   Collection Time: 07/01/21  6:30 AM  Result  Value Ref Range   Troponin I (High Sensitivity) 2,306 (HH) <18 ng/L    Comment: CRITICAL VALUE NOTED.  VALUE IS CONSISTENT WITH PREVIOUSLY REPORTED AND CALLED VALUE. (NOTE) Elevated high sensitivity troponin I (hsTnI) values and significant  changes across serial measurements may suggest ACS but many other  chronic and acute conditions are known to elevate hsTnI results.  Refer to the Links section for chest pain algorithms and additional  guidance. Performed at [facility], [place] [address], [place], [place] [address]   Heparin level (unfractionated)     Status: None   Collection Time: 07/01/21  8:45 AM  Result Value Ref Range   Heparin Unfractionated 0.31 0.30 - 0.70 IU/mL    Comment: (NOTE) The clinical reportable  range upper limit is being lowered to >1.10 to align with the FDA approved guidance for the current laboratory assay.  If heparin results are below expected values, and patient dosage has  been confirmed, suggest follow up testing of antithrombin III levels. Performed at [facility], [place] [address], [place], [place] [address]   Heparin level (unfractionated)     Status: None   Collection Time: 07/02/21 12:51 AM  Result Value Ref Range   Heparin Unfractionated 0.33 0.30 - 0.70 IU/mL    Comment: (NOTE) The clinical reportable range upper limit is being lowered to >1.10 to align with the FDA approved guidance for the current laboratory assay.  If heparin results are below expected values, and patient dosage has  been confirmed, suggest follow up testing of antithrombin III levels. Performed at [facility], [place] [address] [place], [place] [address]   CBC     Status: Abnormal   Collection Time: 07/02/21 12:51 AM  Result Value Ref Range   WBC 12.8 (H) 4.0 - 10.5 K/uL   RBC 5.00 4.22 - 5.81 MIL/uL   Hemoglobin 12.0 (L) 13.0 - 17.0 g/dL   HCT 38.9 (L) 39.0 - 52.0 %   MCV 77.8 (L) 80.0 - 100.0 fL   MCH 24.0 (L) 26.0 - 34.0 pg   MCHC 30.8 30.0 -  36.0 g/dL   RDW 14.9 11.5 - 15.5 %   Platelets 646 (H) 150 - 400 K/uL   nRBC 0.0 0.0 - 0.2 %    Comment: Performed at [facility], [place] [address], [place], [place] [address]  Magnesium     Status: None   Collection Time: 07/02/21 12:51 AM  Result Value Ref Range   Magnesium 1.8 1.7 - 2.4 mg/dL    Comment: Performed at [facility] [address] [place], [place] [address]  Basic metabolic panel     Status: Abnormal   Collection Time: 07/02/21 12:51 AM  Result Value Ref Range   Sodium 136 135 - 145 mmol/L   Potassium 3.2 (L) 3.5 - 5.1 mmol/L   Chloride 99 98 - 111 mmol/L   CO2 27 22 - 32 mmol/L   Glucose, Bld 180 (H) 70 - 99 mg/dL    Comment: Glucose reference range applies only to samples taken after fasting for at least 8 hours.   BUN 35 (H) 8 - 23 mg/dL   Creatinine, Ser 2.23 (H) 0.61 - 1.24 mg/dL   Calcium 9.2 8.9 - 10.3 mg/dL   GFR, Estimated 29 (L) >60 mL/min    Comment: (NOTE) Calculated using the CKD-EPI Creatinine Equation (2021)    Anion gap 10 5 - 15    Comment: Performed at [facility] [address], [place], [place] [address]    ECG   Sinus rhythm at 57, anterior ST segment elevation, LVH by voltage- Personally Reviewed  Telemetry   Sinus rhythm- Personally Reviewed  Radiology    DG Chest Port 1 View  Result Date: 06/30/2021 CLINICAL DATA:  Shortness of breath and chest pain, initial encounter EXAM: PORTABLE CHEST 1 VIEW COMPARISON:  02/01/2021 FINDINGS: Cardiac shadow is stable. Aortic calcifications are again seen and stable. Diffuse increased central vascular congestion is noted with mild interstitial edema. No sizable effusion is seen. No focal infiltrate is noted. IMPRESSION: Diffuse increased vascular congestion and edema consistent with CHF. Electronically Signed   By: Inez Catalina M.D.   On: 06/30/2021 22:57   ECHOCARDIOGRAM COMPLETE  Result Date: 07/01/2021    ECHOCARDIOGRAM REPORT   Patient Name:   KHAMANI FAIRLEY Date of  Exam: 07/01/2021 Medical Rec #:  [id number]          Height:       69.0 in Accession #:    [id number]         Weight:       153.0 lb Date of Birth:  11-23-1941           BSA:          1.844 m Patient Age:    2 years           BP:           136/84 mmHg Patient Gender: M                  HR:           63 bpm. Exam Location:  Inpatient Procedure: 2D Echo, Cardiac Doppler and Color Doppler Indications:    NSTEMI I21.4                 CHF-Acute Systolic M84.13  History:        Patient has prior history of Echocardiogram examinations, most                 recent 01/29/2021. CHF, CAD; Risk Factors:Hypertension.  Sonographer:    Vickie Epley RDCS Referring Phys: [id number] ROBIN [place]  1. Left ventricular ejection fraction, by estimation, is 30 to 35%. The left ventricle has moderately decreased function. The left ventricle demonstrates regional wall motion abnormalities. There is severe hypokinesis of the inferior and inferolateral LV walls. There is moderate left ventricular hypertrophy. Left ventricular diastolic parameters are consistent with Grade I diastolic dysfunction (impaired relaxation).  2. Right ventricular systolic function is normal. The right ventricular size is normal.  3. Left atrial size was mildly dilated.  4. The mitral valve is normal in structure. Trivial mitral valve regurgitation.  5. The aortic valve is tricuspid. There is mild calcification of the aortic valve. There is mild thickening of the aortic valve. Aortic valve regurgitation is trivial. Mild aortic valve sclerosis is present, with no evidence of aortic valve stenosis.  6. The inferior vena cava is normal in size with greater than 50% respiratory variability, suggesting right atrial pressure of 3 mmHg. Comparison(s): Compared to prior TTE in 01/29/21, there is no significant change. EF continues to be ~35% with inferior and inferolateral WMA. FINDINGS  Left Ventricle: Left ventricular ejection fraction, by estimation, is 30 to 35%.  The left ventricle has moderately decreased function. The left ventricle demonstrates regional wall motion abnormalities. There is severe hypokinesis of the inferior and inferolateral LV walls. The left ventricular internal cavity size was normal in size. There is moderate left ventricular hypertrophy. Left ventricular diastolic parameters are consistent with Grade I diastolic dysfunction (impaired relaxation). Right Ventricle: The right ventricular size is normal. Right vetricular wall thickness was not well visualized. Right ventricular systolic function is normal. Left Atrium: Left atrial size was mildly dilated. Right Atrium: Right atrial size was normal in size. Pericardium: There is no evidence of pericardial effusion. Mitral Valve: The mitral valve is normal in structure. There is mild thickening of the mitral valve leaflet(s). There is mild calcification of the mitral valve leaflet(s). Trivial mitral valve regurgitation. Tricuspid Valve: The tricuspid valve is normal in structure. Tricuspid valve regurgitation is trivial. Aortic Valve: The aortic valve is tricuspid. There is mild calcification of the  aortic valve. There is mild thickening of the aortic valve. Aortic valve regurgitation is trivial. Mild aortic valve sclerosis is present, with no evidence of aortic valve stenosis. Pulmonic Valve: The pulmonic valve was normal in structure. Pulmonic valve regurgitation is trivial. Aorta: The aortic root and ascending aorta are structurally normal, with no evidence of dilitation. Venous: The inferior vena cava is normal in size with greater than 50% respiratory variability, suggesting right atrial pressure of 3 mmHg. IAS/Shunts: No atrial level shunt detected by color flow Doppler.  LEFT VENTRICLE PLAX 2D LVIDd:         4.90 cm      Diastology LVIDs:         3.80 cm      LV e' medial:    2.13 cm/s LV PW:         0.90 cm      LV E/e' medial:  11.9 LV IVS:        1.00 cm      LV e' lateral:   3.57 cm/s LVOT diam:      2.00 cm      LV E/e' lateral: 7.1 LV SV:         50 LV SV Index:   27 LVOT Area:     3.14 cm  LV Volumes (MOD) LV vol d, MOD A2C: 154.0 ml LV vol d, MOD A4C: 136.0 ml LV vol s, MOD A2C: 96.4 ml LV vol s, MOD A4C: 93.7 ml LV SV MOD A2C:     57.6 ml LV SV MOD A4C:     136.0 ml LV SV MOD BP:      48.4 ml RIGHT VENTRICLE RV S prime:     11.70 cm/s TAPSE (M-mode): 1.6 cm LEFT ATRIUM           Index       RIGHT ATRIUM           Index LA diam:      4.80 cm 2.60 cm/m  RA Area:     13.00 cm LA Vol (A2C): 42.9 ml 23.27 ml/m RA Volume:   30.00 ml  16.27 ml/m LA Vol (A4C): 65.2 ml 35.36 ml/m  AORTIC VALVE LVOT Vmax:   77.40 cm/s LVOT Vmean:  59.300 cm/s LVOT VTI:    0.160 m  AORTA Ao Root diam: 3.60 cm Ao Asc diam:  3.40 cm MITRAL VALVE MV Area (PHT): 3.37 cm    SHUNTS MV Decel Time: 225 msec    Systemic VTI:  0.16 m MV E velocity: 25.40 cm/s  Systemic Diam: 2.00 cm MV A velocity: 65.80 cm/s MV E/A ratio:  0.39 Gwyndolyn Kaufman MD Electronically signed by Gwyndolyn Kaufman MD Signature Date/Time: 07/01/2021/3:07:34 PM    Final     Cardiac Studies   Echo above  Assessment   Active Problems:   Pulmonary edema   NSTEMI (non-ST elevated myocardial infarction) (HCC)   Acute on chronic HFrEF (heart failure with reduced ejection fraction) (HCC)   Hypoxia   CAD, multiple vessel   CHF (congestive heart failure) ([facility])   Plan   Mr. Sleight says he is feeling better - good diuresis. LVEF unchanged despite increased troponin, however, creatinine has risen as well. Will hold diuretics. No plans for ischemia eval due to renal function. Net negative 3L. Blood tinged urine- may be trauma from foley or infection. Will send UA today. D/c heparin - continue DAPT, but could consider holding Plavix. Bradycardic this am - will lower the coreg  to 6.25 mg BID. Magnesium is 1.8 - replete. Potassium 3.2 - replete. Will need continued monitoring - may be able to d/c in 1-2 days. Will try to update daughter today.  Time  Spent Directly with Patient:  I have spent a total of 35 minutes with the patient reviewing hospital notes, telemetry, EKGs, labs and examining the patient as well as establishing an assessment and plan that was discussed personally with the patient.  > 50% of time was spent in direct patient care.  Length of Stay:  LOS: 1 day   Pixie Casino, MD, [facility], [place] Director of the Advanced Lipid Disorders &  Cardiovascular Risk Reduction Clinic Diplomate of the American Board of Clinical Lipidology Attending Cardiologist  Direct Dial: [phone]  Fax: [phone]  Website:  [email or web address] Osgood Shaleigh Laubscher 07/02/2021, 9:32 AM

## 2021-07-02 NOTE — Progress Notes (Signed)
Foley discontinued per MD order, pt due to void at 2105. Will continue to monitor.  Randy [name] M

## 2021-07-02 NOTE — Progress Notes (Addendum)
Nurse requested update on foley as she has had difficulty obtaining requested UA due to foley leaking. She had reviewed foley with Dr. Debara Pickett today and plan was to discontinue this. I confirmed with Dr. Debara Pickett that he is OK with d/c of foley. Will continue to monitor for any voiding difficulties and await UA.

## 2021-07-02 NOTE — Progress Notes (Signed)
Received pt from the ED this shift: Pt ambulatory with stand-by assistance on arrival. Pt A&O x3, disoriented to location. Pt denies chest pain or discomfort. Foley draining blood tinged urine, MD aware per ED report and MD note. Daughter Joelyn Oms was called and updated on admission. RN could not reach his other daughter [name].  [place] interpreter utilized for POC and admission questions.   Pt noted tugging at Foley and states he has some discomfort when voiding. Foley placement assessed - patent. RN instructed patient not to tug/pull at foley and to call if he experiences pain for prompt treatment. Pt verbalizes understanding and purpose of foley.   Pt ambulated to bathroom for BM and was noted splashing water from the commode onto his bottom. Pt states that he flushes then uses the water to clean his bottom. RN explained to patient that although it is his routine at home, that it is increasing his risk for infection since he has a foley catheter so near to that area. Pt educated on infection risk and offered peri care alternatives.   RN could not complete the remaining admission questions as pt was unable to answer them questions. His daughter [name] helps him with his meds and care. RN will attempt to call daughter in the morning or endorse to day shift RN

## 2021-07-03 DIAGNOSIS — I509 Heart failure, unspecified: Secondary | ICD-10-CM

## 2021-07-03 LAB — CBC
HCT: 36.9 % — ABNORMAL LOW (ref 39.0–52.0)
Hemoglobin: 11.1 g/dL — ABNORMAL LOW (ref 13.0–17.0)
MCH: 23.4 pg — ABNORMAL LOW (ref 26.0–34.0)
MCHC: 30.1 g/dL (ref 30.0–36.0)
MCV: 77.7 fL — ABNORMAL LOW (ref 80.0–100.0)
Platelets: 545 10*3/uL — ABNORMAL HIGH (ref 150–400)
RBC: 4.75 MIL/uL (ref 4.22–5.81)
RDW: 14.8 % (ref 11.5–15.5)
WBC: 7.9 10*3/uL (ref 4.0–10.5)
nRBC: 0 % (ref 0.0–0.2)

## 2021-07-03 LAB — BASIC METABOLIC PANEL
Anion gap: 6 (ref 5–15)
BUN: 41 mg/dL — ABNORMAL HIGH (ref 8–23)
CO2: 27 mmol/L (ref 22–32)
Calcium: 9 mg/dL (ref 8.9–10.3)
Chloride: 103 mmol/L (ref 98–111)
Creatinine, Ser: 2.14 mg/dL — ABNORMAL HIGH (ref 0.61–1.24)
GFR, Estimated: 30 mL/min — ABNORMAL LOW (ref >60)
Glucose, Bld: 160 mg/dL — ABNORMAL HIGH (ref 70–99)
Potassium: 3.7 mmol/L (ref 3.5–5.1)
Sodium: 136 mmol/L (ref 135–145)

## 2021-07-03 LAB — MAGNESIUM: Magnesium: 2.3 mg/dL (ref 1.7–2.4)

## 2021-07-03 MED ORDER — FUROSEMIDE 80 MG PO TABS
80.0000 mg | ORAL_TABLET | Freq: Every day | ORAL | Status: DC
  Administered 2021-07-04 – 2021-07-05 (×2): 80 mg via ORAL
  Filled 2021-07-03 (×2): qty 1

## 2021-07-03 NOTE — Progress Notes (Signed)
Have been in contact with nurse, MD, social worker. Patient having issues with disposition. There was some consideration of discharging to [facility] but family would like the opportunity to discuss amongst themselves potentially going home with family member instead and discharge tomorrow once formal plans known. They are working on getting him an apartment. D/w Dr. Debara Pickett, [place] to stay overnight - await dispo in AM.

## 2021-07-03 NOTE — Progress Notes (Addendum)
CSW received call from patients nurse to speak with patient regarding dc plan. Patient spoke with patient at bedside. Patient gave CSW permission to speak with his cousin Randy [name] who was also at Bedside. Randy [name] confirmed with CSW that patient does not have place to go at DC. Randy [name] confimed that patient cannot return where he came from. CSW called patients daughter Randy, [name] who confirmed they are working on getting an apartment for patient. Patients daughter wants CSW to speak with Randy [name] regarding DC plan for patient. Patient agreed for CSW to discuss DC plan with friend Randy [name]. CSW provided patient with area shelter resources and housing resources. Patient accepted.CSW confirmed with daughter and patients friend Randy [name] for patient to DC to [facility] tomorrow if medically ready. CSW will continue to follow and assist with dc planning needs.

## 2021-07-03 NOTE — Progress Notes (Signed)
DAILY PROGRESS NOTE   Patient Name: Randy [name] Date of Encounter: 07/03/2021 Cardiologist: Donato Heinz, MD  Chief Complaint   No complaints  Patient Profile   Keith Felten is a 80 y.o. 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  Subjective   Creatinine improved today- hemoglobin mildly decreased. Able to void ok. Potassium normal at 3.7, magnesium 2.3.   Objective   Vitals:   07/02/21 2103 07/02/21 2358 07/03/21 0422 07/03/21 0424  BP: (!) 108/53 (!) 142/63  128/60  Pulse:  70  (!) 59  Resp:  17  18  Temp:  98 F (36.7 C)  97.8 F (36.6 C)  TempSrc:  Oral  Oral  SpO2:  94%  94%  Weight:   60.2 kg   Height:        Intake/Output Summary (Last 24 hours) at 07/03/2021 0842 Last data filed at 07/03/2021 0834 Gross per 24 hour  Intake 480 ml  Output 450 ml  Net 30 ml   Filed Weights   07/01/21 2058 07/02/21 0558 07/03/21 0422  Weight: 59 kg 59 kg 60.2 kg    Physical Exam   General appearance: alert and no distress Neck: no carotid bruit and thyroid not enlarged, symmetric, no tenderness/mass/nodules Lungs: clear to auscultation bilaterally Heart: regular rate and rhythm, S1, S2 normal, no murmur, click, rub or gallop Abdomen: soft, non-tender; bowel sounds normal; no masses,  no organomegaly Extremities: extremities normal, atraumatic, no cyanosis or edema Pulses: 2+ and symmetric Skin: Skin color, texture, turgor normal. No rashes or lesions Neurologic: Grossly normal Psych: Pleasant  Inpatient Medications    Scheduled Meds:  aspirin  81 mg Oral Daily   atorvastatin  80 mg Oral Daily   carvedilol  6.25 mg Oral BID WC   Chlorhexidine Gluconate Cloth  6 each Topical Daily    clopidogrel  75 mg Oral Daily   isosorbide-hydrALAZINE  1 tablet Oral TID   pantoprazole  40 mg Oral Q0600   tamsulosin  0.4 mg Oral QPC supper    Continuous Infusions:  nitroGLYCERIN Stopped (07/01/21 1430)    PRN Meds: acetaminophen, nitroGLYCERIN, ondansetron (ZOFRAN) IV   Labs   Results for orders placed or performed during the hospital encounter of 06/30/21 (from the past 48 hour(s))  Heparin level (unfractionated)     Status: None   Collection Time: 07/01/21  8:45 AM  Result Value Ref Range   Heparin Unfractionated 0.31 0.30 - 0.70 IU/mL    Comment: (NOTE) The clinical reportable range upper limit is being lowered to >1.10 to align with the FDA approved guidance for the current laboratory assay.  If heparin results are below expected values, and patient dosage has  been confirmed, suggest follow up testing of antithrombin III levels. Performed at [facility], [place] [address], [place], [place] [address]   Heparin level (unfractionated)     Status: None   Collection Time: 07/02/21 12:51 AM  Result Value Ref Range   Heparin Unfractionated 0.33 0.30 - 0.70 IU/mL    Comment: (NOTE) The clinical reportable range upper limit is being lowered to >1.10 to align with the FDA approved guidance for the current laboratory assay.  If heparin results are below expected values, and  patient dosage has  been confirmed, suggest follow up testing of antithrombin III levels. Performed at [facility], [place] [address] [place], [place] [address]   CBC     Status: Abnormal   Collection Time: 07/02/21 12:51 AM  Result Value Ref Range   WBC 12.8 (H) 4.0 - 10.5 K/uL   RBC 5.00 4.22 - 5.81 MIL/uL   Hemoglobin 12.0 (L) 13.0 - 17.0 g/dL   HCT 38.9 (L) 39.0 - 52.0 %   MCV 77.8 (L) 80.0 - 100.0 fL   MCH 24.0 (L) 26.0 - 34.0 pg   MCHC 30.8 30.0 - 36.0 g/dL   RDW 14.9 11.5 - 15.5 %   Platelets 646 (H) 150 - 400 K/uL   nRBC 0.0 0.0 - 0.2 %    Comment: Performed at [facility], [place] [address] [place], [place] [address]  Magnesium     Status: None   Collection Time: 07/02/21 12:51 AM  Result Value Ref Range   Magnesium 1.8 1.7 - 2.4 mg/dL    Comment: Performed at [facility] [address] [place], [place] [address]  Basic metabolic panel     Status: Abnormal   Collection Time: 07/02/21 12:51 AM  Result Value Ref Range   Sodium 136 135 - 145 mmol/L   Potassium 3.2 (L) 3.5 - 5.1 mmol/L   Chloride 99 98 - 111 mmol/L   CO2 27 22 - 32 mmol/L   Glucose, Bld 180 (H) 70 - 99 mg/dL    Comment: Glucose reference range applies only to samples taken after fasting for at least 8 hours.   BUN 35 (H) 8 - 23 mg/dL   Creatinine, Ser 2.23 (H) 0.61 - 1.24 mg/dL   Calcium 9.2 8.9 - 10.3 mg/dL   GFR, Estimated 29 (L) >60 mL/min    Comment: (NOTE) Calculated using the CKD-EPI Creatinine Equation (2021)    Anion gap 10 5 - 15    Comment: Performed at [facility] [address] [place], [place] [address]  Magnesium     Status: None   Collection Time: 07/03/21  3:57 AM  Result Value Ref Range   Magnesium 2.3 1.7 - 2.4 mg/dL    Comment: Performed at [facility] [address], [place], [place] [address]  Basic metabolic panel     Status: Abnormal   Collection Time: 07/03/21  3:57 AM  Result Value Ref Range   Sodium 136 135 - 145 mmol/L   Potassium 3.7 3.5 - 5.1 mmol/L   Chloride 103 98 - 111 mmol/L   CO2 27 22 - 32 mmol/L   Glucose, Bld 160 (H) 70 - 99 mg/dL    Comment: Glucose reference range applies only to samples taken after fasting for at least 8 hours.   BUN 41 (H) 8 - 23 mg/dL   Creatinine, Ser 2.14 (H) 0.61 - 1.24 mg/dL   Calcium 9.0 8.9 - 10.3 mg/dL   GFR, Estimated 30 (L) >60 mL/min    Comment: (NOTE) Calculated using the CKD-EPI Creatinine Equation (2021)    Anion gap 6 5 - 15    Comment: Performed at [facility] [address], [place] [address]  CBC     Status: Abnormal   Collection Time: 07/03/21  3:57  AM  Result Value Ref Range   WBC 7.9 4.0 - 10.5 K/uL   RBC 4.75 4.22 - 5.81 MIL/uL   Hemoglobin 11.1 (L)  13.0 - 17.0 g/dL   HCT 36.9 (L) 39.0 - 52.0 %   MCV 77.7 (L) 80.0 - 100.0 fL   MCH 23.4 (L) 26.0 - 34.0 pg   MCHC 30.1 30.0 - 36.0 g/dL   RDW 14.8 11.5 - 15.5 %   Platelets 545 (H) 150 - 400 K/uL   nRBC 0.0 0.0 - 0.2 %    Comment: Performed at [facility] [address], [place], [place] [address]    ECG   Sinus rhythm at 57, anterior ST segment elevation, LVH by voltage- Personally Reviewed  Telemetry   Sinus rhythm- Personally Reviewed  Radiology    ECHOCARDIOGRAM COMPLETE  Result Date: 07/01/2021    ECHOCARDIOGRAM REPORT   Patient Name:   CHUCK CABAN Date of Exam: 07/01/2021 Medical Rec #:  [id number]          Height:       69.0 in Accession #:    [id number]         Weight:       153.0 lb Date of Birth:  1941-04-27           BSA:          1.844 m Patient Age:    58 years           BP:           136/84 mmHg Patient Gender: M                  HR:           63 bpm. Exam Location:  Inpatient Procedure: 2D Echo, Cardiac Doppler and Color Doppler Indications:    NSTEMI I21.4                 CHF-Acute Systolic C58.52  History:        Patient has prior history of Echocardiogram examinations, most                 recent 01/29/2021. CHF, CAD; Risk Factors:Hypertension.  Sonographer:    Vickie Epley RDCS Referring Phys: [id number] ROBIN Yale  1. Left ventricular ejection fraction, by estimation, is 30 to 35%. The left ventricle has moderately decreased function. The left ventricle demonstrates regional wall motion abnormalities. There is severe hypokinesis of the inferior and inferolateral LV walls. There is moderate left ventricular hypertrophy. Left ventricular diastolic parameters are consistent with Grade I diastolic dysfunction (impaired relaxation).  2. Right ventricular systolic function is normal. The right ventricular size is normal.  3. Left atrial size was  mildly dilated.  4. The mitral valve is normal in structure. Trivial mitral valve regurgitation.  5. The aortic valve is tricuspid. There is mild calcification of the aortic valve. There is mild thickening of the aortic valve. Aortic valve regurgitation is trivial. Mild aortic valve sclerosis is present, with no evidence of aortic valve stenosis.  6. The inferior vena cava is normal in size with greater than 50% respiratory variability, suggesting right atrial pressure of 3 mmHg. Comparison(s): Compared to prior TTE in 01/29/21, there is no significant change. EF continues to be ~35% with inferior and inferolateral WMA. FINDINGS  Left Ventricle: Left ventricular ejection fraction, by estimation, is 30 to 35%. The left ventricle has moderately decreased function. The left ventricle demonstrates regional wall motion abnormalities. There is severe hypokinesis of the inferior and inferolateral LV walls. The left ventricular internal cavity size was normal in size. There is moderate left ventricular hypertrophy. Left ventricular diastolic  parameters are consistent with Grade I diastolic dysfunction (impaired relaxation). Right Ventricle: The right ventricular size is normal. Right vetricular wall thickness was not well visualized. Right ventricular systolic function is normal. Left Atrium: Left atrial size was mildly dilated. Right Atrium: Right atrial size was normal in size. Pericardium: There is no evidence of pericardial effusion. Mitral Valve: The mitral valve is normal in structure. There is mild thickening of the mitral valve leaflet(s). There is mild calcification of the mitral valve leaflet(s). Trivial mitral valve regurgitation. Tricuspid Valve: The tricuspid valve is normal in structure. Tricuspid valve regurgitation is trivial. Aortic Valve: The aortic valve is tricuspid. There is mild calcification of the aortic valve. There is mild thickening of the aortic valve. Aortic valve regurgitation is trivial. Mild  aortic valve sclerosis is present, with no evidence of aortic valve stenosis. Pulmonic Valve: The pulmonic valve was normal in structure. Pulmonic valve regurgitation is trivial. Aorta: The aortic root and ascending aorta are structurally normal, with no evidence of dilitation. Venous: The inferior vena cava is normal in size with greater than 50% respiratory variability, suggesting right atrial pressure of 3 mmHg. IAS/Shunts: No atrial level shunt detected by color flow Doppler.  LEFT VENTRICLE PLAX 2D LVIDd:         4.90 cm      Diastology LVIDs:         3.80 cm      LV e' medial:    2.13 cm/s LV PW:         0.90 cm      LV E/e' medial:  11.9 LV IVS:        1.00 cm      LV e' lateral:   3.57 cm/s LVOT diam:     2.00 cm      LV E/e' lateral: 7.1 LV SV:         50 LV SV Index:   27 LVOT Area:     3.14 cm  LV Volumes (MOD) LV vol d, MOD A2C: 154.0 ml LV vol d, MOD A4C: 136.0 ml LV vol s, MOD A2C: 96.4 ml LV vol s, MOD A4C: 93.7 ml LV SV MOD A2C:     57.6 ml LV SV MOD A4C:     136.0 ml LV SV MOD BP:      48.4 ml RIGHT VENTRICLE RV S prime:     11.70 cm/s TAPSE (M-mode): 1.6 cm LEFT ATRIUM           Index       RIGHT ATRIUM           Index LA diam:      4.80 cm 2.60 cm/m  RA Area:     13.00 cm LA Vol (A2C): 42.9 ml 23.27 ml/m RA Volume:   30.00 ml  16.27 ml/m LA Vol (A4C): 65.2 ml 35.36 ml/m  AORTIC VALVE LVOT Vmax:   77.40 cm/s LVOT Vmean:  59.300 cm/s LVOT VTI:    0.160 m  AORTA Ao Root diam: 3.60 cm Ao Asc diam:  3.40 cm MITRAL VALVE MV Area (PHT): 3.37 cm    SHUNTS MV Decel Time: 225 msec    Systemic VTI:  0.16 m MV E velocity: 25.40 cm/s  Systemic Diam: 2.00 cm MV A velocity: 65.80 cm/s MV E/A ratio:  0.39 Gwyndolyn Kaufman MD Electronically signed by Gwyndolyn Kaufman MD Signature Date/Time: 07/01/2021/3:07:34 PM    Final     Cardiac Studies   Echo above  Assessment  Active Problems:   Pulmonary edema   NSTEMI (non-ST elevated myocardial infarction) (HCC)   Acute on chronic HFrEF (heart failure  with reduced ejection fraction) (HCC)   Hypoxia   CAD, multiple vessel   CHF (congestive heart failure) ([place])   Plan   Mr. Tagliaferro says he is feeling better - urinating ok. Urine is now clear off heparin. Creatinine has improved, will start maintenance lasix tomorrow. He is medically ok for d/c home today, however, have had difficulty reaching family and securing a disposition. Will ask for social work assistance.  Time Spent Directly with Patient:  I have spent a total of 25 minutes with the patient reviewing hospital notes, telemetry, EKGs, labs and examining the patient as well as establishing an assessment and plan that was discussed personally with the patient.  > 50% of time was spent in direct patient care.  Length of Stay:  LOS: 2 days   Pixie Casino, MD, [facility], [place] Director of the Advanced Lipid Disorders &  Cardiovascular Risk Reduction Clinic Diplomate of the American Board of Clinical Lipidology Attending Cardiologist  Direct Dial: [phone]  Fax: [phone]  Website:  [email or web address] 07/03/2021, 8:42 AM

## 2021-07-04 ENCOUNTER — Other Ambulatory Visit (HOSPITAL_COMMUNITY): Payer: Self-pay

## 2021-07-04 LAB — CBC
HCT: 35 % — ABNORMAL LOW (ref 39.0–52.0)
Hemoglobin: 10.7 g/dL — ABNORMAL LOW (ref 13.0–17.0)
MCH: 23.9 pg — ABNORMAL LOW (ref 26.0–34.0)
MCHC: 30.6 g/dL (ref 30.0–36.0)
MCV: 78.3 fL — ABNORMAL LOW (ref 80.0–100.0)
Platelets: 581 10*3/uL — ABNORMAL HIGH (ref 150–400)
RBC: 4.47 MIL/uL (ref 4.22–5.81)
RDW: 14.6 % (ref 11.5–15.5)
WBC: 8.8 10*3/uL (ref 4.0–10.5)
nRBC: 0 % (ref 0.0–0.2)

## 2021-07-04 LAB — URINALYSIS, ROUTINE W REFLEX MICROSCOPIC
Bilirubin Urine: NEGATIVE
Glucose, UA: NEGATIVE mg/dL
Hgb urine dipstick: NEGATIVE
Ketones, ur: NEGATIVE mg/dL
Nitrite: NEGATIVE
Protein, ur: NEGATIVE mg/dL
Specific Gravity, Urine: 1.015 (ref 1.005–1.030)
pH: 5.5 (ref 5.0–8.0)

## 2021-07-04 LAB — MAGNESIUM: Magnesium: 2.3 mg/dL (ref 1.7–2.4)

## 2021-07-04 LAB — BASIC METABOLIC PANEL
Anion gap: 8 (ref 5–15)
BUN: 47 mg/dL — ABNORMAL HIGH (ref 8–23)
CO2: 24 mmol/L (ref 22–32)
Calcium: 8.8 mg/dL — ABNORMAL LOW (ref 8.9–10.3)
Chloride: 103 mmol/L (ref 98–111)
Creatinine, Ser: 2.22 mg/dL — ABNORMAL HIGH (ref 0.61–1.24)
GFR, Estimated: 29 mL/min — ABNORMAL LOW (ref >60)
Glucose, Bld: 133 mg/dL — ABNORMAL HIGH (ref 70–99)
Potassium: 4.1 mmol/L (ref 3.5–5.1)
Sodium: 135 mmol/L (ref 135–145)

## 2021-07-04 LAB — URINALYSIS, MICROSCOPIC (REFLEX)
Bacteria, UA: NONE SEEN
Squamous Epithelial / HPF: NONE SEEN (ref 0–5)

## 2021-07-04 MED ORDER — ISOSORB DINITRATE-HYDRALAZINE 20-37.5 MG PO TABS
1.0000 | ORAL_TABLET | Freq: Three times a day (TID) | ORAL | 6 refills | Status: DC
  Filled 2021-07-04: qty 90, 30d supply, fill #0

## 2021-07-04 MED ORDER — FUROSEMIDE 80 MG PO TABS
80.0000 mg | ORAL_TABLET | Freq: Every day | ORAL | 6 refills | Status: DC
  Filled 2021-07-04: qty 30, 30d supply, fill #0

## 2021-07-04 MED ORDER — ACETAMINOPHEN 325 MG PO TABS: 650.0000 mg | ORAL_TABLET | ORAL | Status: DC | PRN

## 2021-07-04 NOTE — Discharge Instructions (Signed)
Heart Healthy low salt diet   If you have scales, weigh daily and call if weight climbs more than 3 pounds in a day or 5 pounds in a week.

## 2021-07-04 NOTE — TOC Progression Note (Signed)
Transition of Care (TOC) - Progression Note  Marvetta Gibbons RN, BSN Transitions of Care Unit 4E- RN Case Manager See Treatment Team for direct phone #  Cross coverage [place]  Patient Details  Name: Randy [name] MRN: [id number] Date of Birth: 12-04-1941  Transition of Care [facility]) CM/SW Contact  Dahlia Client Romeo Rabon, RN Phone Number: 07/04/2021, 4:42 PM  Clinical Narrative:    Notified by [facility] main office that we have received notice from [place] that an appeal has been called in on on patient. Awaiting fax regarding Medicare appeal. It appears pt's friend Ronalee Belts as called in the appeal on pt's behalf.   Have notified MD team regarding appeal- d/c will be on hold until appeal process has been completed.   1500- CSW and CM went to bedside to speak with pt using [employer] (ID [phone]) attempted discussion with pt regarding appeal with interpreter assistance. Per interpreter patient does not seem to understand about the appeal. Pt keeps saying "Ronalee Belts called someone"- we tried asking several ways to see if pt was agreeable with appeal and understood about the appeal- but it did not seem pt understood what we were talking about per the interpreter. Asked interpreter to ask pt about any shortness of breath which pt reported having some during the night but denied having any since then. Only complaint was some pain in his knees.  Had the interpreter explain to pt that he would not be leaving hospital today.  Pt's friend Ronalee Belts was also speaking to pt on arrival to the room by phone- per Ronalee Belts he is on the way to the hospital to "explain" things to patient- have requested to speak with Ronalee Belts when he arrives.   [place] arrived to bedside however did not stay long enough for staff to come speak with him- CSW- Stephanie Coup. And CM- made TC to speak with him regarding Appeal process, tried to explain to [facility] appeal process and DNOD/HINN notices- however Ronalee Belts would interrupt explanations,  Attempted to explain that forms needed to be signed with patient understanding appeal process and what that means- however pt at this time does not seem to understand appeal process and that without forms being signed and faxed in appeal process can not move forward. Attempted to discuss medical status and stability however Ronalee Belts voices that he feels patient is not telling staff that he still has medical symptoms. Ronalee Belts states he will be able to return to hospital in the AM (10:00) to help explain appeal to pt and have needed forms signed that need to be faxed in for appeal process.   TOC will f/u in am with Ronalee Belts and the patient at the bedside- team/MD updated on d/c-appeal status.       Expected Discharge Plan and Services           Expected Discharge Date: 07/04/21                                     Social Determinants of Health (SDOH) Interventions    Readmission Risk Interventions No flowsheet data found.

## 2021-07-04 NOTE — Progress Notes (Signed)
CSW received call from patients friend Ronalee Belts regarding patients dc plan. Patients friend asked for [facility] address and telephone number. CSW provided information to him. Ronalee Belts then informed CSW that he appealed patients dc. Case manager, RN, and MD informed of appeal. CSW and Kristi case manager went in to speak with patient at bedside. CSW and Casemanager used pac[employer] interpreters to help communicate with patient. Patient did not understand the appeal information provided to him per interpreter. Ronalee Belts patients friend is going to meet with Case manager and CSW in the morning with patient. Ronalee Belts patients friend is going to try and explain appeal to patient. CSW will continue to follow and assist with discharge planning needs.

## 2021-07-04 NOTE — Progress Notes (Signed)
DAILY PROGRESS NOTE   Patient Name: Randy [name] Date of Encounter: 07/04/2021 Cardiologist: Donato Heinz, MD  Chief Complaint   No complaints  Patient Profile   Randy [name] is a 80 y.o. 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  Subjective   No issues overnight - mild drop in hemoglobin, but no further hematuria. Creatinine stabilized between 2.1-2.2. Weight stable.  Objective   Vitals:   07/04/21 0022 07/04/21 0520 07/04/21 0530 07/04/21 0849  BP: 126/61  (!) 157/66 137/60  Pulse: 60  65 63  Resp: 19  17 16   Temp: 98.3 F (36.8 C)  97.7 F (36.5 C) (!) 97.5 F (36.4 C)  TempSrc: Oral  Oral Oral  SpO2:   92% 97%  Weight:  60.1 kg    Height:        Intake/Output Summary (Last 24 hours) at 07/04/2021 0900 Last data filed at 07/03/2021 2020 Gross per 24 hour  Intake 480 ml  Output 150 ml  Net 330 ml   Filed Weights   07/02/21 0558 07/03/21 0422 07/04/21 0520  Weight: 59 kg 60.2 kg 60.1 kg    Physical Exam   General appearance: alert and no distress Neck: no carotid bruit and thyroid not enlarged, symmetric, no tenderness/mass/nodules Lungs: clear to auscultation bilaterally Heart: regular rate and rhythm, S1, S2 normal, no murmur, click, rub or gallop Abdomen: soft, non-tender; bowel sounds normal; no masses,  no organomegaly Extremities: extremities normal, atraumatic, no cyanosis or edema Pulses: 2+ and symmetric Skin: Skin color, texture, turgor normal. No rashes or lesions Neurologic: Grossly normal Psych: Pleasant  Inpatient Medications    Scheduled Meds:  aspirin  81 mg Oral Daily   atorvastatin  80 mg Oral Daily   carvedilol  6.25 mg Oral BID WC   clopidogrel  75 mg Oral  Daily   furosemide  80 mg Oral Daily   isosorbide-hydrALAZINE  1 tablet Oral TID   pantoprazole  40 mg Oral Q0600   tamsulosin  0.4 mg Oral QPC supper    Continuous Infusions:    PRN Meds: acetaminophen, nitroGLYCERIN, ondansetron (ZOFRAN) IV   Labs   Results for orders placed or performed during the hospital encounter of 06/30/21 (from the past 48 hour(s))  Magnesium     Status: None   Collection Time: 07/03/21  3:57 AM  Result Value Ref Range   Magnesium 2.3 1.7 - 2.4 mg/dL    Comment: Performed at [facility], [address] [place],  [address]  Basic metabolic panel     Status: Abnormal   Collection Time: 07/03/21  3:57 AM  Result Value Ref Range   Sodium 136 135 - 145 mmol/L   Potassium 3.7 3.5 - 5.1 mmol/L   Chloride 103 98 - 111 mmol/L   CO2 27 22 - 32 mmol/L   Glucose, Bld 160 (H) 70 - 99 mg/dL    Comment: Glucose reference range applies only to samples taken after fasting for at least 8 hours.   BUN 41 (H) 8 - 23 mg/dL   Creatinine, Ser 2.14 (H) 0.61 - 1.24 mg/dL   Calcium 9.0 8.9 - 10.3 mg/dL   GFR, Estimated 30 (L) >60 mL/min    Comment: (  NOTE) Calculated using the CKD-EPI Creatinine Equation (2021)    Anion gap 6 5 - 15    Comment: Performed at [facility], [place] [address], [place], [place] [address]  CBC     Status: Abnormal   Collection Time: 07/03/21  3:57 AM  Result Value Ref Range   WBC 7.9 4.0 - 10.5 K/uL   RBC 4.75 4.22 - 5.81 MIL/uL   Hemoglobin 11.1 (L) 13.0 - 17.0 g/dL   HCT 36.9 (L) 39.0 - 52.0 %   MCV 77.7 (L) 80.0 - 100.0 fL   MCH 23.4 (L) 26.0 - 34.0 pg   MCHC 30.1 30.0 - 36.0 g/dL   RDW 14.8 11.5 - 15.5 %   Platelets 545 (H) 150 - 400 K/uL   nRBC 0.0 0.0 - 0.2 %    Comment: Performed at [facility], [place] [address] [place], [place] [address]  Magnesium     Status: None   Collection Time: 07/04/21  1:20 AM  Result Value Ref Range   Magnesium 2.3 1.7 - 2.4 mg/dL    Comment: Performed at [facility] [address], [place], [place] [address]  Basic metabolic panel     Status: Abnormal   Collection Time: 07/04/21  1:20 AM  Result Value Ref Range   Sodium 135 135 - 145 mmol/L   Potassium 4.1 3.5 - 5.1 mmol/L   Chloride 103 98 - 111 mmol/L   CO2 24 22 - 32 mmol/L   Glucose, Bld 133 (H) 70 - 99 mg/dL    Comment: Glucose reference range applies only to samples taken after fasting for at least 8 hours.   BUN 47 (H) 8 - 23 mg/dL   Creatinine, Ser 2.22 (H) 0.61 - 1.24 mg/dL   Calcium 8.8 (L) 8.9 - 10.3 mg/dL   GFR, Estimated 29 (L) >60 mL/min    Comment: (NOTE) Calculated using the CKD-EPI Creatinine Equation (2021)    Anion gap 8 5 - 15    Comment: Performed at [facility] [address] [place], [place] [address]  CBC     Status: Abnormal   Collection Time: 07/04/21  1:20 AM  Result Value Ref Range   WBC 8.8 4.0 - 10.5 K/uL   RBC 4.47 4.22 - 5.81 MIL/uL   Hemoglobin 10.7 (L) 13.0 - 17.0 g/dL   HCT 35.0 (L) 39.0 - 52.0 %   MCV 78.3 (L) 80.0 - 100.0 fL   MCH 23.9 (L) 26.0 - 34.0 pg   MCHC 30.6 30.0 - 36.0 g/dL   RDW 14.6 11.5 - 15.5 %   Platelets 581 (H) 150 - 400 K/uL   nRBC 0.0 0.0 - 0.2 %    Comment: Performed at [facility] [address] [place], [place] [address]    ECG   Sinus rhythm at 57, anterior ST segment elevation, LVH by voltage- Personally Reviewed  Telemetry   Sinus rhythm- Personally Reviewed  Radiology    No results found.  Cardiac Studies   Echo above  Assessment   Active Problems:   Pulmonary edema   NSTEMI (non-ST elevated myocardial infarction) (HCC)   Acute on chronic HFrEF (heart failure with reduced ejection fraction) (HCC)   Hypoxia   CAD, multiple vessel   CHF (congestive heart failure) ([place])   Plan   Mr. Borba is medically stable for discharge today. Arrangements have been made for housing by social work yesterday. Follow-up with Dr. Gardiner Rhyme  and a Teacher, adult education at the visit.  Time Spent Directly  with Patient:  I have spent a total of 25 minutes with the patient reviewing hospital notes, telemetry, EKGs, labs and examining the patient as well as establishing an assessment and plan that was discussed personally with the patient.  > 50% of time was spent in direct patient care.  Length of Stay:  LOS: 3 days   Pixie Casino, MD, [facility], [place] Director of the Advanced Lipid Disorders &  Cardiovascular Risk Reduction Clinic Diplomate of the American Board of Clinical Lipidology Attending Cardiologist  Direct Dial: [phone]  Fax: [phone]  Website:  [email or web address] 07/04/2021, 9:00 AM

## 2021-07-04 NOTE — Discharge Summary (Addendum)
Discharge Summary    Patient ID: Randy [name] MRN: [id number]; DOB: 03/19/1941  Admit date: 06/30/2021 Discharge date: 07/04/2021  PCP:  Jilda Panda, MD   [facility] Providers Cardiologist:  Donato Heinz, MD        Discharge Diagnoses    Principal Problem:   Pulmonary edema Active Problems:   NSTEMI (non-ST elevated myocardial infarction) [facility])   Acute on chronic HFrEF (heart failure with reduced ejection fraction) (HCC)   Hypoxia   CAD, multiple vessel   CHF (congestive heart failure) ([place])    Diagnostic Studies/Procedures    Echo 07/01/21 IMPRESSIONS     1. Left ventricular ejection fraction, by estimation, is 30 to 35%. The  left ventricle has moderately decreased function. The left ventricle  demonstrates regional wall motion abnormalities. There is severe  hypokinesis of the inferior and inferolateral  LV walls. There is moderate left ventricular hypertrophy. Left ventricular  diastolic parameters are consistent with Grade I diastolic dysfunction  (impaired relaxation).   2. Right ventricular systolic function is normal. The right ventricular  size is normal.   3. Left atrial size was mildly dilated.   4. The mitral valve is normal in structure. Trivial mitral valve  regurgitation.   5. The aortic valve is tricuspid. There is mild calcification of the  aortic valve. There is mild thickening of the aortic valve. Aortic valve  regurgitation is trivial. Mild aortic valve sclerosis is present, with no  evidence of aortic valve stenosis.   6. The inferior vena cava is normal in size with greater than 50%  respiratory variability, suggesting right atrial pressure of 3 mmHg.   Comparison(s): Compared to prior TTE in 01/29/21, there is no significant  change. EF continues to be ~35% with inferior and inferolateral WMA.   FINDINGS   Left Ventricle: Left ventricular ejection fraction, by estimation, is 30  to 35%. The left ventricle has moderately  decreased function. The left  ventricle demonstrates regional wall motion abnormalities. There is severe  hypokinesis of the inferior and  inferolateral LV walls. The left ventricular internal cavity size was  normal in size. There is moderate left ventricular hypertrophy. Left  ventricular diastolic parameters are consistent with Grade I diastolic  dysfunction (impaired relaxation).   Right Ventricle: The right ventricular size is normal. Right vetricular  wall thickness was not well visualized. Right ventricular systolic  function is normal.   Left Atrium: Left atrial size was mildly dilated.   Right Atrium: Right atrial size was normal in size.   Pericardium: There is no evidence of pericardial effusion.   Mitral Valve: The mitral valve is normal in structure. There is mild  thickening of the mitral valve leaflet(s). There is mild calcification of  the mitral valve leaflet(s). Trivial mitral valve regurgitation.  _____________   History of Present Illness     Randy [name] is a 80 y.o. 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    While driving pt with chest pain and he stopped and called 911.  His sp02 was 82% by EMS, laboured breathing.  ASA 324 and NTG sl given X 1. BP was 170/90s,  EKG NSR with LVH and ST depression in lateral leads.  He had been having SOB for several days. And occ  chest pain.  Initially placed on non re breather but weanted to 2L 02, CXR with pulmonary edema.  BNP 1687 and troponin 1344. K+ 4.0  pt admitted with acute on chronic systolic HF with known EF 35-40%. Also with NSTEMI with hs troponin 1344.  His las cath as above with medical management.      Hospital Course     Consultants: none   Pt was  diuresed over next several day and since admit neg 2694 ml and wt has been stable at 60.1 Kg at discharge.  Echo this admit 30-35%.  Mod. LVH, G1DD. Trivial MR, trivial AR. No significant change.  He did have some bradycardia and needed lower dose of BB.  He also had hematuria and heparin was stopped.  He has had due to traumatic foley.   Cr at discharge 2.22 up from admit of 1.68 will monitor as outpt.  Hgb at discharge 10.7 down from 12.2, may be low he has been on IV heparin, now stopped.  No further hematuria.  U/A without nitrates.   Pt has progressed and was to be discharged 07/03/21 but housing arrangements had not yet been made.   Pt has no place to live at discharge.  He could not go back to prior arrangement. Plan for [facility] to [facility].  Pt seen and evaluated By Dr. Debara Pickett and stable for discharge.  He will follow up in specialty CHF clinic to prevent re-hospitalization.   ADDENDUM 07/05/21 by Melina Copa PA-C - discharge summary reviewed as outlined by Cecilie Kicks, NP. Patient remained inpatient until today pending resolution of disposition/discharge appeal. Tamsulosin was increased to BID on 07/05/21 for urinary frequency felt due to BPH. Otherwise plans remain as previously outlined. Dr. Debara Pickett has seen and evaluated the patient and feels he is stable for discharge. CHF TOC follow-up has already been arranged as well as with f/u with Dr. Gardiner Rhyme.  -----  Did the patient have an acute coronary syndrome (MI, NSTEMI, STEMI, etc) this admission?:  Yes                               AHA/ACC Clinical Performance & Quality Measures: Aspirin prescribed? - Yes ADP Receptor Inhibitor (Plavix/Clopidogrel, Brilinta/Ticagrelor or Effient/Prasugrel) prescribed (includes medically managed patients)? - Yes Beta Blocker prescribed? - Yes High Intensity Statin (Lipitor 40-80mg  or Crestor 20-40mg ) prescribed? - Yes EF assessed during THIS hospitalization? - Yes For EF <40%, was ACEI/ARB prescribed? - No renal  insuff For EF <40%, Aldosterone Antagonist (Spironolactone or Eplerenone) prescribed? - No - Reason:  renal insuff  Cardiac Rehab Phase II ordered (including medically managed patients)? - No - no insurance   lives in homeless shelter      _____________  Discharge Vitals Blood pressure 137/60, pulse 63, temperature (!) 97.5 F (36.4 C), temperature source Oral, resp. rate 16, height 4\' 9"  (1.448 m), weight 60.1 kg, SpO2 97 %.  Filed Weights   07/02/21 0558 07/03/21 0422 07/04/21 0520  Weight: 59 kg 60.2 kg 60.1 kg    Labs & Radiologic Studies    CBC Recent Labs    07/03/21 0357 07/04/21 0120  WBC 7.9 8.8  HGB 11.1* 10.7*  HCT 36.9* 35.0*  MCV 77.7* 78.3*  PLT 545* 503*   Basic Metabolic Panel Recent Labs    07/03/21 0357 07/04/21 0120  NA 136 135  K 3.7 4.1  CL 103 103  CO2 27 24  GLUCOSE 160* 133*  BUN 41* 47*  CREATININE 2.14* 2.22*  CALCIUM 9.0 8.8*  MG 2.3 2.3   Liver Function Tests No results for input(s): AST, ALT, ALKPHOS, BILITOT, PROT, ALBUMIN in the last 72 hours. No results for input(s): LIPASE, AMYLASE in the last 72 hours. High Sensitivity Troponin:   Recent Labs  Lab 06/30/21 2229 07/01/21 0023 07/01/21 0630  TROPONINIHS 1,344* 1,671* 2,306*    BNP Invalid input(s): POCBNP D-Dimer No results for input(s): DDIMER in the last 72 hours. Hemoglobin A1C No results for input(s): HGBA1C in the last 72 hours. Fasting Lipid Panel No results for input(s): CHOL, HDL, LDLCALC, TRIG, CHOLHDL, LDLDIRECT in the last 72 hours. Thyroid Function Tests No results for input(s): TSH, T4TOTAL, T3FREE, THYROIDAB in the last 72 hours.  Invalid input(s): FREET3 _____________  DG Chest Port 1 View  Result Date: 06/30/2021 CLINICAL DATA:  Shortness of breath and chest pain, initial encounter EXAM: PORTABLE CHEST 1 VIEW COMPARISON:  02/01/2021 FINDINGS: Cardiac shadow is stable. Aortic calcifications are again seen and stable. Diffuse increased central vascular  congestion is noted with mild interstitial edema. No sizable effusion is seen. No focal infiltrate is noted. IMPRESSION: Diffuse increased vascular congestion and edema consistent with CHF. Electronically Signed   By: Inez Catalina M.D.   On: 06/30/2021 22:57   ECHOCARDIOGRAM COMPLETE  Result Date: 07/01/2021    ECHOCARDIOGRAM REPORT   Patient Name:   CHRLES SELLEY Date of Exam: 07/01/2021 Medical Rec #:  [id number]          Height:       69.0 in Accession #:    [id number]         Weight:       153.0 lb Date of Birth:  1941/08/09           BSA:          1.844 m Patient Age:    80 years           BP:           136/84 mmHg Patient Gender: M                  HR:           63 bpm. Exam Location:  Inpatient Procedure: 2D Echo, Cardiac Doppler and Color Doppler Indications:    NSTEMI I21.4                 CHF-Acute Systolic Y65.03  History:        Patient has prior history of Echocardiogram examinations, most                 recent 01/29/2021. CHF, CAD; Risk Factors:Hypertension.  Sonographer:    Vickie Epley RDCS Referring Phys: [id number] ROBIN [place]  1. Left ventricular ejection fraction, by estimation, is 30 to 35%. The left ventricle has moderately decreased function. The left ventricle demonstrates regional wall motion abnormalities. There is severe hypokinesis of the inferior and inferolateral LV walls. There is moderate left ventricular hypertrophy. Left ventricular diastolic parameters are consistent with Grade I diastolic dysfunction (impaired relaxation).  2. Right ventricular systolic function is normal. The right ventricular size is normal.  3. Left atrial size was mildly dilated.  4. The mitral valve is normal in structure. Trivial mitral valve regurgitation.  5. The aortic valve is tricuspid. There is mild calcification of the aortic valve. There is mild thickening of the aortic valve. Aortic valve regurgitation is trivial. Mild aortic valve sclerosis is present, with  no evidence of aortic  valve stenosis.  6. The inferior vena cava is normal in size with greater than 50% respiratory variability, suggesting right atrial pressure of 3 mmHg. Comparison(s): Compared to prior TTE in 01/29/21, there is no significant change. EF continues to be ~35% with inferior and inferolateral WMA. FINDINGS  Left Ventricle: Left ventricular ejection fraction, by estimation, is 30 to 35%. The left ventricle has moderately decreased function. The left ventricle demonstrates regional wall motion abnormalities. There is severe hypokinesis of the inferior and inferolateral LV walls. The left ventricular internal cavity size was normal in size. There is moderate left ventricular hypertrophy. Left ventricular diastolic parameters are consistent with Grade I diastolic dysfunction (impaired relaxation). Right Ventricle: The right ventricular size is normal. Right vetricular wall thickness was not well visualized. Right ventricular systolic function is normal. Left Atrium: Left atrial size was mildly dilated. Right Atrium: Right atrial size was normal in size. Pericardium: There is no evidence of pericardial effusion. Mitral Valve: The mitral valve is normal in structure. There is mild thickening of the mitral valve leaflet(s). There is mild calcification of the mitral valve leaflet(s). Trivial mitral valve regurgitation. Tricuspid Valve: The tricuspid valve is normal in structure. Tricuspid valve regurgitation is trivial. Aortic Valve: The aortic valve is tricuspid. There is mild calcification of the aortic valve. There is mild thickening of the aortic valve. Aortic valve regurgitation is trivial. Mild aortic valve sclerosis is present, with no evidence of aortic valve stenosis. Pulmonic Valve: The pulmonic valve was normal in structure. Pulmonic valve regurgitation is trivial. Aorta: The aortic root and ascending aorta are structurally normal, with no evidence of dilitation. Venous: The inferior vena cava is normal in size with  greater than 50% respiratory variability, suggesting right atrial pressure of 3 mmHg. IAS/Shunts: No atrial level shunt detected by color flow Doppler.  LEFT VENTRICLE PLAX 2D LVIDd:         4.90 cm      Diastology LVIDs:         3.80 cm      LV e' medial:    2.13 cm/s LV PW:         0.90 cm      LV E/e' medial:  11.9 LV IVS:        1.00 cm      LV e' lateral:   3.57 cm/s LVOT diam:     2.00 cm      LV E/e' lateral: 7.1 LV SV:         50 LV SV Index:   27 LVOT Area:     3.14 cm  LV Volumes (MOD) LV vol d, MOD A2C: 154.0 ml LV vol d, MOD A4C: 136.0 ml LV vol s, MOD A2C: 96.4 ml LV vol s, MOD A4C: 93.7 ml LV SV MOD A2C:     57.6 ml LV SV MOD A4C:     136.0 ml LV SV MOD BP:      48.4 ml RIGHT VENTRICLE RV S prime:     11.70 cm/s TAPSE (M-mode): 1.6 cm LEFT ATRIUM           Index       RIGHT ATRIUM           Index LA diam:      4.80 cm 2.60 cm/m  RA Area:     13.00 cm LA Vol (A2C): 42.9 ml 23.27 ml/m RA Volume:   30.00 ml  16.27 ml/m LA Vol (A4C): 65.2 ml  35.36 ml/m  AORTIC VALVE LVOT Vmax:   77.40 cm/s LVOT Vmean:  59.300 cm/s LVOT VTI:    0.160 m  AORTA Ao Root diam: 3.60 cm Ao Asc diam:  3.40 cm MITRAL VALVE MV Area (PHT): 3.37 cm    SHUNTS MV Decel Time: 225 msec    Systemic VTI:  0.16 m MV E velocity: 25.40 cm/s  Systemic Diam: 2.00 cm MV A velocity: 65.80 cm/s MV E/A ratio:  0.39 Gwyndolyn Kaufman MD Electronically signed by Gwyndolyn Kaufman MD Signature Date/Time: 07/01/2021/3:07:34 PM    Final    Disposition   Pt is being discharged home today in good condition.  Follow-up Plans & Appointments   Heart Healthy low salt diet   If you have scales, weigh daily and call if weight climbs more than 3 pounds in a day or 5 pounds in a week.   Follow-up Information     [facility] Follow up today.   Specialty: Cardiology Why: - Follow up 07/11/21 at 2:00 PM at [place] (CHF clinic at [facility]). Please arrive 15 minutes  early to check in. - Parking code is [id number] for the month of July for the parking garage off [address]. There is also Valet parking available at the [place] entrance. Contact information: [address] [id number] [place] [address] [place]        Donato Heinz, MD Follow up.   Specialties: Cardiology, Radiology Why: [facility] location - you have a follow-up scheduled on Thursday July 25, 2021 at 1:30 PM (Arrive by 1:15 PM). Contact information: [address] [place] [address] [phone]                  Discharge Medications   Allergies as of 07/05/2021   No Known Allergies      Medication List     TAKE these medications    acetaminophen 325 MG tablet Commonly known as: TYLENOL Take 2 tablets (650 mg total) by mouth every 4 (four) hours as needed for headache or mild pain.   aspirin 81 MG chewable tablet Chew 1 tablet (81 mg total) by mouth daily.   atorvastatin 80 MG tablet Commonly known as: LIPITOR Take 1 tablet (80 mg total) by mouth daily.   BiDil 20-37.5 MG tablet Generic drug: isosorbide-hydrALAZINE Take 1 tablet by mouth 3 (three) times daily.   carvedilol 6.25 MG tablet Commonly known as: COREG Take 1 tablet (6.25 mg total) by mouth 2 (two) times daily with a meal.   clopidogrel 75 MG tablet Commonly known as: PLAVIX Take 1 tablet (75 mg total) by mouth daily.   furosemide 80 MG tablet Commonly known as: LASIX Take 1 tablet (80 mg total) by mouth daily. What changed:  medication strength how much to take   nitroGLYCERIN 0.4 MG SL tablet Commonly known as: NITROSTAT Place 1 tablet (0.4 mg total) under the tongue every 5 (five) minutes x 3 doses as needed for chest pain.   pantoprazole 40 MG tablet Commonly known as: PROTONIX Take 1 tablet (40 mg total) by mouth daily at 6 (six) AM.   tamsulosin 0.4 MG Caps capsule Commonly known as:  FLOMAX Take 1 capsule (0.4 mg total) by mouth 2 (two) times daily. What changed: when to take this           Outstanding Labs/Studies   BMP in 1 week  Duration of Discharge Encounter   Greater than 30 minutes including physician time.  Signed, Cecilie Kicks, NP 07/04/2021, 10:49 AM   Addendum: Chart reviewed, patient did not go home yesterday due to discharge appeal. Anticipate discharge as planned today after progression meeting. AVS was updated to reflect increased dose of tamsulosin, otherwise discharge summary remains as outlined above by Cecilie Kicks, NP. Zyshonne Malecha PA-C

## 2021-07-05 ENCOUNTER — Other Ambulatory Visit (HOSPITAL_COMMUNITY): Payer: Self-pay

## 2021-07-05 DIAGNOSIS — D649 Anemia, unspecified: Secondary | ICD-10-CM

## 2021-07-05 DIAGNOSIS — N401 Enlarged prostate with lower urinary tract symptoms: Secondary | ICD-10-CM

## 2021-07-05 DIAGNOSIS — N138 Other obstructive and reflux uropathy: Secondary | ICD-10-CM

## 2021-07-05 DIAGNOSIS — R001 Bradycardia, unspecified: Secondary | ICD-10-CM

## 2021-07-05 DIAGNOSIS — R319 Hematuria, unspecified: Secondary | ICD-10-CM

## 2021-07-05 MED ORDER — TAMSULOSIN HCL 0.4 MG PO CAPS: 0.4000 mg | ORAL_CAPSULE | Freq: Two times a day (BID) | ORAL | Status: DC

## 2021-07-05 MED ORDER — TAMSULOSIN HCL 0.4 MG PO CAPS
0.4000 mg | ORAL_CAPSULE | Freq: Two times a day (BID) | ORAL | 3 refills | Status: DC
  Filled 2021-07-05: qty 60, 30d supply, fill #0

## 2021-07-05 NOTE — Progress Notes (Signed)
Discharge instruction discussed with pt with help of [place] interpreter. Pt had indicated he understood instructions. Asked if he has questions for me with regards to d/c instruction he says he does not have any.

## 2021-07-05 NOTE — Progress Notes (Signed)
DAILY PROGRESS NOTE   Patient Name: Randy [name] Date of Encounter: 07/05/2021 Cardiologist: Donato Heinz, MD  Chief Complaint   No complaints  Patient Profile   Randy [name] is a 80 y.o. 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  Subjective   Vitals normal today. Weight is stable. No complaints overnight. Urinalysis yesterday negative- no blood. Long talk with patient and professional Randy [name] today - only real complaints are knee pain (likely arthritis) and urinating every few hours (BPH).  Objective   Vitals:   07/04/21 0849 07/04/21 2024 07/04/21 2117 07/05/21 0502  BP: 137/60 118/60 137/60 (!) 123/50  Pulse: 63 65  62  Resp: 16 18  16   Temp: (!) 97.5 F (36.4 C) 98.4 F (36.9 C)  98.4 F (36.9 C)  TempSrc: Oral Oral  Oral  SpO2: 97% 95%  95%  Weight:    60.8 kg  Height:       No intake or output data in the 24 hours ending 07/05/21 0857  Filed Weights   07/03/21 0422 07/04/21 0520 07/05/21 0502  Weight: 60.2 kg 60.1 kg 60.8 kg    Physical Exam   General appearance: alert and no distress Neck: no carotid bruit and thyroid not enlarged, symmetric, no tenderness/mass/nodules Lungs: clear to auscultation bilaterally Heart: regular rate and rhythm, S1, S2 normal, no murmur, click, rub or gallop Abdomen: soft, non-tender; bowel sounds normal; no masses,  no organomegaly Extremities: extremities normal, atraumatic, no cyanosis or edema Pulses: 2+ and symmetric Skin: Skin color, texture, turgor normal. No rashes or lesions Neurologic: Grossly normal Psych: Pleasant  Inpatient Medications    Scheduled Meds:  aspirin  81 mg Oral Daily   atorvastatin  80 mg Oral Daily    carvedilol  6.25 mg Oral BID WC   clopidogrel  75 mg Oral Daily   furosemide  80 mg Oral Daily   isosorbide-hydrALAZINE  1 tablet Oral TID   pantoprazole  40 mg Oral Q0600   tamsulosin  0.4 mg Oral QPC supper    Continuous Infusions:    PRN Meds: acetaminophen, nitroGLYCERIN, ondansetron (ZOFRAN) IV   Labs   Results for orders placed or performed during the hospital encounter of 06/30/21 (from the past 48 hour(s))  Magnesium     Status: None   Collection Time: 07/04/21  1:20 AM  Result Value Ref Range   Magnesium 2.3 1.7 - 2.4 mg/dL    Comment: Performed at [facility], [address], [place], [place] [address]  Basic metabolic panel     Status: Abnormal   Collection Time: 07/04/21  1:20 AM  Result Value Ref Range   Sodium 135 135 - 145 mmol/L   Potassium 4.1 3.5 - 5.1 mmol/L   Chloride 103 98 - 111 mmol/L   CO2 24 22 - 32 mmol/L   Glucose, Bld 133 (H) 70 - 99 mg/dL    Comment: Glucose reference range applies only to samples taken after fasting for at least 8 hours.   BUN 47 (H) 8 - 23 mg/dL   Creatinine, Ser 2.22 (H) 0.61 - 1.24 mg/dL   Calcium 8.8 (L) 8.9 - 10.3 mg/dL   GFR, Estimated 29 (L) >60 mL/min  Comment: (NOTE) Calculated using the CKD-EPI Creatinine Equation (2021)    Anion gap 8 5 - 15    Comment: Performed at [facility], [place] [address] [place], [place] [address]  CBC     Status: Abnormal   Collection Time: 07/04/21  1:20 AM  Result Value Ref Range   WBC 8.8 4.0 - 10.5 K/uL   RBC 4.47 4.22 - 5.81 MIL/uL   Hemoglobin 10.7 (L) 13.0 - 17.0 g/dL   HCT 35.0 (L) 39.0 - 52.0 %   MCV 78.3 (L) 80.0 - 100.0 fL   MCH 23.9 (L) 26.0 - 34.0 pg   MCHC 30.6 30.0 - 36.0 g/dL   RDW 14.6 11.5 - 15.5 %   Platelets 581 (H) 150 - 400 K/uL   nRBC 0.0 0.0 - 0.2 %    Comment: Performed at [facility] [address] [place], [place] [address]  Urinalysis, Routine w reflex microscopic     Status: Abnormal   Collection Time: 07/04/21 11:15 AM   Result Value Ref Range   Color, Urine YELLOW YELLOW   APPearance CLEAR CLEAR   Specific Gravity, Urine 1.015 1.005 - 1.030   pH 5.5 5.0 - 8.0   Glucose, UA NEGATIVE NEGATIVE mg/dL   Hgb urine dipstick NEGATIVE NEGATIVE   Bilirubin Urine NEGATIVE NEGATIVE   Ketones, ur NEGATIVE NEGATIVE mg/dL   Protein, ur NEGATIVE NEGATIVE mg/dL   Nitrite NEGATIVE NEGATIVE   Leukocytes,Ua TRACE (A) NEGATIVE    Comment: Performed at [facility] [address], [place], [place] [address]  Urinalysis, Microscopic (reflex)     Status: None   Collection Time: 07/04/21 11:15 AM  Result Value Ref Range   RBC / HPF 0-5 0 - 5 RBC/hpf   WBC, UA 0-5 0 - 5 WBC/hpf   Bacteria, UA NONE SEEN NONE SEEN   Squamous Epithelial / LPF NONE SEEN 0 - 5    Comment: Performed at [facility], [place] [address], [place], [place] [address]    ECG   N/A  Telemetry   N/A  Radiology    No results found.  Cardiac Studies   Echo above  Assessment   Principal Problem:   Pulmonary edema Active Problems:   NSTEMI (non-ST elevated myocardial infarction) (HCC)   Acute on chronic HFrEF (heart failure with reduced ejection fraction) (HCC)   Hypoxia   CAD, multiple vessel   CHF (congestive heart failure) ([place])   Plan   Randy [name] continues to be medically stable for discharge - his discharge has been apparently appealed by a friend - it appears family is not able to take him home. Appreciate social work assistance. Will increase tamsulosin to 0.4 mg BID for frequent urination. Continue other meds.  Time Spent Directly with Patient:  I have spent a total of 25 minutes with the patient reviewing hospital notes, telemetry, EKGs, labs and examining the patient as well as establishing an assessment and plan that was discussed personally with the patient.  > 50% of time was spent in direct patient care.  Length of Stay:  LOS: 4 days   Pixie Casino, MD, [facility], [place] Director of the Advanced Lipid Disorders &  Cardiovascular Risk Reduction Clinic Diplomate of the American Board of Clinical Lipidology Attending Cardiologist  Direct Dial: [phone]  Fax: [phone]  Website:  [email or web address] 07/05/2021, 8:57 AM

## 2021-07-05 NOTE — TOC Progression Note (Addendum)
Transition of Care [facility]) - Progression Note    Patient Details  Name: Randy [name] MRN: [id number] Date of Birth: November 03, 1941  Transition of Care [facility]) CM/SW [place], [place] Phone Number: 07/05/2021, 11:46 AM  Clinical Narrative:     CSW, case manager Steffanie Dunn, and [employer] spoke with Randy [name] patients friend by phone. Randy [name] has confirmed he is going to cancel patients appeal for discharge. CSW spoke with Randy [name] to let him know that [facility] is not currently opened today and that CSW found a homeless shelter that currently has male beds available. CSW explained to patients friend Randy [name] that the beds are first come first serve. CSW let Randy [name] know that we can provide transportation for patient to shelter. Randy [name] patients friend agreed to shelter placement for patient and asked CSW for homeless shelter address. CSW sent Randy [name] address to homeless shelter that we will be sending patient to at dc.CSW spoke with patient at bedside CSW  used pacific interpreter [phone] to help communicate with patient. CSW explained to patient that the [facility] is not currently opened today. CSW explained that they are closed every third Friday for staff development day. CSW found patient a shelter in [place] called [facility]. CSW informed patient that she found a shelter that has male beds currently available but is first come first serve. CSW let patient know we can provide transportation to transport him over there. Patient understood and agreed.CSW also informed patient that she talked to his friend Randy [name] and gave him address of shelter where patient will be.No further questions reported at this time. Patient will DC over today to [facility] the homeless.        Expected Discharge Plan and Services           Expected Discharge Date: 07/04/21                                     Social Determinants of Health (SDOH) Interventions    Readmission Risk  Interventions No flowsheet data found.

## 2021-07-05 NOTE — Plan of Care (Signed)
  Problem: Education: Goal: Knowledge of General Education information will improve Description: Including pain rating scale, medication(s)/side effects and non-pharmacologic comfort measures Outcome: Adequate for Discharge   Problem: Health Behavior/Discharge Planning: Goal: Ability to manage health-related needs will improve Outcome: Adequate for Discharge   Problem: Clinical Measurements: Goal: Ability to maintain clinical measurements within normal limits will improve Outcome: Adequate for Discharge Goal: Will remain free from infection Outcome: Adequate for Discharge Goal: Diagnostic test results will improve Outcome: Adequate for Discharge Goal: Respiratory complications will improve Outcome: Adequate for Discharge Goal: Cardiovascular complication will be avoided Outcome: Adequate for Discharge   Problem: Activity: Goal: Risk for activity intolerance will decrease Outcome: Adequate for Discharge   Problem: Nutrition: Goal: Adequate nutrition will be maintained Outcome: Adequate for Discharge   Problem: Coping: Goal: Level of anxiety will decrease Outcome: Adequate for Discharge   Problem: Elimination: Goal: Will not experience complications related to bowel motility Outcome: Adequate for Discharge Goal: Will not experience complications related to urinary retention Outcome: Adequate for Discharge   Problem: Pain Managment: Goal: General experience of comfort will improve Outcome: Adequate for Discharge   Problem: Safety: Goal: Ability to remain free from injury will improve Outcome: Adequate for Discharge   Problem: Skin Integrity: Goal: Risk for impaired skin integrity will decrease Outcome: Adequate for Discharge   Problem: Education: Goal: Ability to demonstrate management of disease process will improve Outcome: Adequate for Discharge Goal: Ability to verbalize understanding of medication therapies will improve Outcome: Adequate for Discharge Goal:  Individualized Educational Video(s) Outcome: Adequate for Discharge   Problem: Activity: Goal: Capacity to carry out activities will improve Outcome: Adequate for Discharge   Problem: Cardiac: Goal: Ability to achieve and maintain adequate cardiopulmonary perfusion will improve Outcome: Adequate for Discharge   Problem: Education: Goal: Understanding of cardiac disease, CV risk reduction, and recovery process will improve Outcome: Adequate for Discharge Goal: Individualized Educational Video(s) Outcome: Adequate for Discharge   Problem: Activity: Goal: Ability to tolerate increased activity will improve Outcome: Adequate for Discharge   Problem: Cardiac: Goal: Ability to achieve and maintain adequate cardiovascular perfusion will improve Outcome: Adequate for Discharge   Problem: Health Behavior/Discharge Planning: Goal: Ability to safely manage health-related needs after discharge will improve Outcome: Adequate for Discharge

## 2021-07-05 NOTE — Care Management Important Message (Signed)
Important Message  Patient Details  Name: Randy [name] MRN: [id number]   [facility] Appeal Detailed Notice of Discharge letter created and saved: Yes Jeronimo Norma NCM completed doc and delivered to the patient) Detailed Notice of Discharge Document Given to [employer]: Yes [employer] ROI Document Created: Yes [employer] appeal documents uploaded to [employer] stite: Yes    Ghada Abbett 07/05/2021, 9:13 AM

## 2021-07-05 NOTE — TOC Transition Note (Signed)
Transition of Care [facility]) - CM/SW Discharge Note   Patient Details  Name: Nat Lowenthal MRN: [id number] Date of Birth: [birth year]  Transition of Care [facility]) CM/SW Contact:  Trula Ore, [place] Phone Number: 07/05/2021, 12:16 PM   Clinical Narrative:     Patient will DC to: [place] [place] [address]  Anticipated DC date: 07/05/2021  Family notified: Ronalee Belts   Transport by: Larence Penning Transportation   ?  Per MD patient ready for DC to [facility] for the homeless . RN, patient, patient's family, notified of DC.  [facility] transport requested for patient.  CSW signing off.           Patient Goals and CMS Choice        Discharge Placement                       Discharge Plan and Services                                     Social Determinants of Health (SDOH) Interventions     Readmission Risk Interventions No flowsheet data found.

## 2021-07-05 NOTE — Care Management Important Message (Signed)
Important Message  Patient Details  Name: Randy [name] MRN: [id number] Date of Birth: March 28, 1941   Medicare Important Message Given:  Yes     [name] 07/05/2021, 10:04 AM

## 2021-07-05 NOTE — Progress Notes (Signed)
CM present in the room with CSW and [employer] on TC- provided pt with copy of [facility] Detailed Notice of Discharge, [facility] staff has confirmed that pt's friend Ronalee Belts has called into [employer] and cancelled pt's appeal. Pt is cleared to be discharged today. CSW is working on IT trainer and communicating with Ronalee Belts plan for discharge.

## 2021-07-10 ENCOUNTER — Telehealth (HOSPITAL_COMMUNITY): Payer: Self-pay | Admitting: Surgery

## 2021-07-10 NOTE — Telephone Encounter (Signed)
I called patient to provide reminder for Heart Impact Appt scheduled tomorrow.  I spoke with his daughter who tells me that he is unable to make appt and will need to reschedule.  She was unable to give an alternate date as she is out of town currently.  She says that she will call when appointment can be rescheduled.

## 2021-07-11 ENCOUNTER — Encounter (HOSPITAL_COMMUNITY): Payer: Medicare Other

## 2021-07-21 NOTE — Progress Notes (Deleted)
Cardiology Office Note:    Date:  07/21/2021   ID:  Randy [name], DOB 1941/02/01, MRN [id number]  PCP:  Jilda Panda, MD  Cardiologist:  Donato Heinz, MD  Electrophysiologist:  None   Referring MD: Jilda Panda, MD   No chief complaint on file. ***  History of Present Illness:    Randy [name] is a 80 y.o. male with a hx of severe multivessel CAD being medically managed, hypertension, hyperlipidemia, CKD stage III-IV, gout who presents for hospital follow-up.  He was admitted in February 2022 with chest pain, found of NSTEMI with peak troponin 16,532.  Echo showed EF 35 to 40% with inferior/inferolateral wall motion abnormality.  Cardiac catheterization showed severe multivessel CAD.  Did not be candidate for CABG or PCI he was discharged on medical therapy.  He did not follow-up and presented back to [facility] with chest pain on 06/30/2021.  Initially hypoxic to 80s, required nonrebreather.  Improved with IV diuresis.  Echo showed EF 30 to 35%.  Course was complicated by AKI, creatinine 1.7 on admission had increased to 2.2 on discharge. Past Medical History:  Diagnosis Date   Acute on chronic HFrEF (heart failure with reduced ejection fraction) ([place]) 07/01/2021   CAD, 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   Hearing loss    HTN (hypertension)    Hypoxia 07/01/2021    Past Surgical History:  Procedure Laterality Date   EYE SURGERY     IABP INSERTION N/A 01/30/2021   Procedure: IABP Insertion;  Surgeon: Leonie Man, MD;  Location: [place] [facility];  Service: Cardiovascular;  Laterality: N/A;   RIGHT/LEFT HEART CATH AND CORONARY ANGIOGRAPHY N/A 01/30/2021   Procedure: RIGHT/LEFT HEART CATH AND CORONARY ANGIOGRAPHY;  Surgeon: Leonie Man, MD;  Location: [place] [facility];  Service: Cardiovascular;  Laterality: N/A;    Current Medications: No outpatient medications have been marked as taking for the 07/25/21 encounter (Appointment) with Donato Heinz, MD.      Allergies:   Patient has no known allergies.   Social History   Socioeconomic History   Marital status: Married    Spouse name: Not on file   Number of children: Not on file   Years of education: Not on file   Highest education level: Not on file  Occupational History   Not on file  Tobacco Use   Smoking status: Former    Types: Cigarettes   Smokeless tobacco: Not on file  Substance and Sexual Activity   Alcohol use: Not on file   Drug use: Not on file   Sexual activity: Not on file  Other Topics Concern   Not on file  Social History Narrative   ** Merged History Encounter **       Social Determinants of Health   Financial Resource Strain: Not on file  Food Insecurity: Not on file  Transportation Needs: Not on file  Physical Activity: Not on file  Stress: Not on file  Social Connections: Not on file     Family History: The patient's ***family history is not on file.  ROS:   Please see the history of present illness.    *** All other systems reviewed and are negative.  EKGs/Labs/Other Studies Reviewed:    The following studies were reviewed today: ***  EKG:  EKG is *** ordered today.  The ekg ordered today demonstrates ***  Recent Labs: 01/29/2021: TSH 1.851 06/30/2021: ALT 15; B Natriuretic Peptide 1,687.3 07/04/2021: BUN 47; Creatinine, Ser 2.22; Hemoglobin 10.7; Magnesium 2.3;  Platelets 581; Potassium 4.1; Sodium 135  Recent Lipid Panel    Component Value Date/Time   CHOL 211 (H) 01/29/2021 0352   TRIG 71 01/29/2021 0352   HDL 68 01/29/2021 0352   CHOLHDL 3.1 01/29/2021 0352   VLDL 14 01/29/2021 0352   LDLCALC 129 (H) 01/29/2021 0352    Physical Exam:    VS:  There were no vitals taken for this visit.    Wt Readings from Last 3 Encounters:  07/05/21 134 lb (60.8 kg)  02/06/21 153 lb 14.1 oz (69.8 kg)     GEN: *** Well nourished, well developed in no acute distress HEENT: Normal NECK: No JVD; No carotid bruits LYMPHATICS: No  lymphadenopathy CARDIAC: ***RRR, no murmurs, rubs, gallops RESPIRATORY:  Clear to auscultation without rales, wheezing or rhonchi  ABDOMEN: Soft, non-tender, non-distended MUSCULOSKELETAL:  No edema; No deformity  SKIN: Warm and dry NEUROLOGIC:  Alert and oriented x 3 PSYCHIATRIC:  Normal affect   ASSESSMENT:    No diagnosis found. PLAN:    CAD with severe three-vessel CAD including left main disease: Evaluated by cardiac surgery, not candidate for CABG note targets for PCI. 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  Ischemic cardiomyopathy. 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  RTC in***   Medication Adjustments/Labs and Tests Ordered: Current medicines are reviewed at length with the patient today.  Concerns regarding medicines are outlined above.  No orders of the defined types were placed in this encounter.  No orders of the defined types were placed in this encounter.   There are no Patient Instructions on file for this visit.   Signed, Donato Heinz, MD  07/21/2021 8:51 PM    [facility]

## 2021-07-25 ENCOUNTER — Ambulatory Visit: Payer: Medicare Other | Admitting: Cardiology

## 2021-08-07 ENCOUNTER — Telehealth (HOSPITAL_COMMUNITY): Payer: Self-pay

## 2021-08-07 NOTE — Telephone Encounter (Signed)
Heart Failure Nurse Navigator  Called primary number listed. Joelyn Oms, daughter answered with permission from patient. Attempted to reschedule HV TOC appt as pt had missed scheduled appointment in July and also missed [facility] appt 8/4. Daughter stated they (both family members) were out of town from 7/17-8/7 and unable to take him to his appointment. Encouraged getting cardiology follow up as medication changes were made at last hospitalization.  Daughter reluctant to schedule as she works third shift and sister works day shift, described difficulty with care. Offered [facility] transportation services, declined. Declined follow up appointment at this time as she was driving to work.   Offered to callback next week to check availability and coordinate appointment, declined. Encouraged cardiology follow up again. Left phone number if needs arise.   Pricilla Holm, RN, BSN Heart Failure Nurse Navigator [phone]

## 2021-09-19 ENCOUNTER — Emergency Department (HOSPITAL_COMMUNITY)
Admission: EM | Admit: 2021-09-19 | Discharge: 2021-09-20 | Disposition: A | Discharge status: Home / Self Care | Payer: Medicare Other | Attending: Emergency Medicine | Admitting: Emergency Medicine

## 2021-09-19 ENCOUNTER — Encounter (HOSPITAL_COMMUNITY): Payer: Self-pay | Admitting: Emergency Medicine

## 2021-09-19 DIAGNOSIS — Z7982 Long term (current) use of aspirin: Secondary | ICD-10-CM | POA: Diagnosis not present

## 2021-09-19 DIAGNOSIS — R1030 Lower abdominal pain, unspecified: Secondary | ICD-10-CM | POA: Diagnosis present

## 2021-09-19 DIAGNOSIS — I2510 Atherosclerotic heart disease of native coronary artery without angina pectoris: Secondary | ICD-10-CM | POA: Diagnosis not present

## 2021-09-19 DIAGNOSIS — Z79899 Other long term (current) drug therapy: Secondary | ICD-10-CM | POA: Insufficient documentation

## 2021-09-19 DIAGNOSIS — Z7902 Long term (current) use of antithrombotics/antiplatelets: Secondary | ICD-10-CM | POA: Diagnosis not present

## 2021-09-19 DIAGNOSIS — I130 Hypertensive heart and chronic kidney disease with heart failure and stage 1 through stage 4 chronic kidney disease, or unspecified chronic kidney disease: Secondary | ICD-10-CM | POA: Insufficient documentation

## 2021-09-19 DIAGNOSIS — R339 Retention of urine, unspecified: Secondary | ICD-10-CM | POA: Diagnosis not present

## 2021-09-19 DIAGNOSIS — N1832 Chronic kidney disease, stage 3b: Secondary | ICD-10-CM | POA: Diagnosis not present

## 2021-09-19 DIAGNOSIS — I509 Heart failure, unspecified: Secondary | ICD-10-CM | POA: Diagnosis not present

## 2021-09-19 DIAGNOSIS — Z87891 Personal history of nicotine dependence: Secondary | ICD-10-CM | POA: Insufficient documentation

## 2021-09-19 NOTE — ED Notes (Signed)
PT called for room X3 with no response. PT not outside nor in triage

## 2021-09-19 NOTE — ED Provider Notes (Signed)
Emergency Medicine Provider Triage Evaluation Note  Other Randy [name] , a 80 y.o. male  was evaluated in triage.  Pt complains of dysuria and urinary leakage that began this morning.  He states that he had a COVID shot a few days ago.  He reports associated suprapubic abdominal pain which she rates mild severity but denies any nausea, vomiting, diarrhea, constipation, fever, chills.  Review of Systems  Positive:  Negative: See above  Physical Exam  BP (!) 157/90 (BP Location: Right Arm)   Pulse 81   Temp 98.9 F (37.2 C) (Oral)   Resp 16   SpO2 100%  Gen:   Awake, no distress   Resp:  Normal effort  MSK:   Moves extremities without difficulty  Other:  Mild suprapubic abdominal tenderness  Medical Decision Making  Medically screening exam initiated at 3:48 PM.  Appropriate orders placed.  Randy [name] was informed that the remainder of the evaluation will be completed by another provider, this initial triage assessment does not replace that evaluation, and the importance of remaining in the ED until their evaluation is complete.     Randy [name] [place], PA-C 09/19/21 1549    Gareth Morgan, MD 09/19/21 [phone]

## 2021-09-19 NOTE — ED Triage Notes (Signed)
Patient here for evaluation of lower abdominal pain that radiates into both legs that started this morning. Denies nausea, vomiting, diarrhea. Patient alert, oriented, ambulatory, and in no apparent distress at this time.

## 2021-09-20 ENCOUNTER — Emergency Department (HOSPITAL_COMMUNITY): Payer: Medicare Other

## 2021-09-20 DIAGNOSIS — R339 Retention of urine, unspecified: Secondary | ICD-10-CM | POA: Diagnosis not present

## 2021-09-20 LAB — CBC WITH DIFFERENTIAL/PLATELET
Abs Immature Granulocytes: 0.03 10*3/uL (ref 0.00–0.07)
Basophils Absolute: 0 10*3/uL (ref 0.0–0.1)
Basophils Relative: 0 %
Eosinophils Absolute: 0.6 10*3/uL — ABNORMAL HIGH (ref 0.0–0.5)
Eosinophils Relative: 6 %
HCT: 45.4 % (ref 39.0–52.0)
Hemoglobin: 13.7 g/dL (ref 13.0–17.0)
Immature Granulocytes: 0 %
Lymphocytes Relative: 10 %
Lymphs Abs: 1 10*3/uL (ref 0.7–4.0)
MCH: 23 pg — ABNORMAL LOW (ref 26.0–34.0)
MCHC: 30.2 g/dL (ref 30.0–36.0)
MCV: 76.2 fL — ABNORMAL LOW (ref 80.0–100.0)
Monocytes Absolute: 1.2 10*3/uL — ABNORMAL HIGH (ref 0.1–1.0)
Monocytes Relative: 11 %
Neutro Abs: 7.5 10*3/uL (ref 1.7–7.7)
Neutrophils Relative %: 73 %
Platelets: 577 10*3/uL — ABNORMAL HIGH (ref 150–400)
RBC: 5.96 MIL/uL — ABNORMAL HIGH (ref 4.22–5.81)
RDW: 15.5 % (ref 11.5–15.5)
WBC: 10.3 10*3/uL (ref 4.0–10.5)
nRBC: 0 % (ref 0.0–0.2)

## 2021-09-20 LAB — COMPREHENSIVE METABOLIC PANEL
ALT: 19 U/L (ref 0–44)
AST: 22 U/L (ref 15–41)
Albumin: 3.9 g/dL (ref 3.5–5.0)
Alkaline Phosphatase: 57 U/L (ref 38–126)
Anion gap: 12 (ref 5–15)
BUN: 27 mg/dL — ABNORMAL HIGH (ref 8–23)
CO2: 20 mmol/L — ABNORMAL LOW (ref 22–32)
Calcium: 9.2 mg/dL (ref 8.9–10.3)
Chloride: 104 mmol/L (ref 98–111)
Creatinine, Ser: 1.73 mg/dL — ABNORMAL HIGH (ref 0.61–1.24)
GFR, Estimated: 39 mL/min — ABNORMAL LOW (ref >60)
Glucose, Bld: 98 mg/dL (ref 70–99)
Potassium: 4.1 mmol/L (ref 3.5–5.1)
Sodium: 136 mmol/L (ref 135–145)
Total Bilirubin: 0.8 mg/dL (ref 0.3–1.2)
Total Protein: 7.3 g/dL (ref 6.5–8.1)

## 2021-09-20 LAB — URINALYSIS, ROUTINE W REFLEX MICROSCOPIC
Bacteria, UA: NONE SEEN
Bilirubin Urine: NEGATIVE
Glucose, UA: 50 mg/dL — AB
Ketones, ur: NEGATIVE mg/dL
Leukocytes,Ua: NEGATIVE
Nitrite: NEGATIVE
Protein, ur: 100 mg/dL — AB
Specific Gravity, Urine: 1.016 (ref 1.005–1.030)
pH: 5 (ref 5.0–8.0)

## 2021-09-20 LAB — LIPASE, BLOOD: Lipase: 59 U/L — ABNORMAL HIGH (ref 11–51)

## 2021-09-20 NOTE — ED Notes (Signed)
Leg bag placed.

## 2021-09-20 NOTE — ED Notes (Signed)
Difficult to communicate with patient due to language barrier, he speaks laotion.  Interpreter not available.

## 2021-09-20 NOTE — ED Provider Notes (Signed)
[facility] EMERGENCY DEPARTMENT Provider Note   CSN: [id number] Arrival date & time: 09/19/21  1415     History Chief Complaint  Patient presents with   Abdominal Pain    Randy [name] is a 80 y.o. male.  80 yo M with a chief complaints of lower abdominal discomfort.  Difficult to obtain history patient speaks loation and had 4 AM I was unable to get a loation interpreter on the phone.   Abdominal Pain Associated symptoms: no chest pain, no chills, no cough, no dysuria, no fever, no hematuria, no shortness of breath, no sore throat and no vomiting       Past Medical History:  Diagnosis Date   Acute on chronic HFrEF (heart failure with reduced ejection fraction) ([place]) 07/01/2021   CAD, 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   Hearing loss    HTN (hypertension)    Hypoxia 07/01/2021    Patient Active Problem List   Diagnosis Date Noted   Bradycardia 07/05/2021   Hematuria 07/05/2021   Anemia 07/05/2021   Acute on chronic HFrEF (heart failure with reduced ejection fraction) ([place]) 07/01/2021   Hypoxia 07/01/2021   CAD, 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   CHF (congestive heart failure) ([place]) 07/01/2021   Stage 3b chronic kidney disease ([place])    Primary hypertension    Mixed hyperlipidemia    Pulmonary edema    AKI (acute kidney injury) ([place])    NSTEMI (non-ST elevated myocardial infarction) ([place])    Renal insufficiency 01/29/2021   Hypertensive crisis 01/29/2021   New onset of congestive heart failure ([place]) 01/29/2021   Acute respiratory failure with hypoxia ([place]) 01/29/2021    Past Surgical History:  Procedure Laterality Date   EYE SURGERY     IABP INSERTION N/A 01/30/2021   Procedure: IABP Insertion;  Surgeon: Leonie Man, MD;  Location: [facility];  Service: Cardiovascular;  Laterality: N/A;   RIGHT/LEFT HEART CATH AND CORONARY ANGIOGRAPHY N/A 01/30/2021   Procedure: RIGHT/LEFT HEART CATH AND CORONARY ANGIOGRAPHY;  Surgeon: Leonie Man, MD;   Location: [facility];  Service: Cardiovascular;  Laterality: N/A;       No family history on file.  Social History   Tobacco Use   Smoking status: Former    Types: Cigarettes    Home Medications Prior to Admission medications   Medication Sig Start Date End Date Taking? Authorizing Provider  acetaminophen (TYLENOL) 325 MG tablet Take 2 tablets (650 mg total) by mouth every 4 (four) hours as needed for headache or mild pain. 07/04/21   Isaiah Serge, NP  aspirin 81 MG chewable tablet Chew 1 tablet (81 mg total) by mouth daily. 02/06/21   Hosie Poisson, MD  atorvastatin (LIPITOR) 80 MG tablet Take 1 tablet (80 mg total) by mouth daily. 02/06/21   Hosie Poisson, MD  carvedilol (COREG) 6.25 MG tablet Take 1 tablet (6.25 mg total) by mouth 2 (two) times daily with a meal. 02/05/21   Hosie Poisson, MD  clopidogrel (PLAVIX) 75 MG tablet Take 1 tablet (75 mg total) by mouth daily. 02/06/21   Hosie Poisson, MD  furosemide (LASIX) 80 MG tablet Take 1 tablet (80 mg total) by mouth daily. 07/05/21   Isaiah Serge, NP  isosorbide-hydrALAZINE (BIDIL) 20-37.5 MG tablet Take 1 tablet by mouth 3 (three) times daily. 07/04/21   Isaiah Serge, NP  nitroGLYCERIN (NITROSTAT) 0.4 MG SL tablet Place 1 tablet (0.4 mg total) under the tongue every 5 (five) minutes x 3 doses  as needed for chest pain. 02/05/21   Hosie Poisson, MD  pantoprazole (PROTONIX) 40 MG tablet Take 1 tablet (40 mg total) by mouth daily at 6 (six) AM. 02/06/21   Hosie Poisson, MD  tamsulosin (FLOMAX) 0.4 MG CAPS capsule Take 1 capsule (0.4 mg total) by mouth 2 (two) times daily. 07/05/21   Hilty, Nadean Corwin, MD    Allergies    Patient has no known allergies.  Review of Systems   Review of Systems  Constitutional:  Negative for chills and fever.  HENT:  Negative for ear pain and sore throat.   Eyes:  Negative for pain and visual disturbance.  Respiratory:  Negative for cough and shortness of breath.   Cardiovascular:  Negative for  chest pain and palpitations.  Gastrointestinal:  Positive for abdominal pain. Negative for vomiting.  Genitourinary:  Negative for dysuria and hematuria.  Musculoskeletal:  Negative for arthralgias and back pain.  Skin:  Negative for color change and rash.  Neurological:  Negative for seizures and syncope.  All other systems reviewed and are negative.  Physical Exam Updated Vital Signs BP (!) 183/65   Pulse (!) 54   Temp 97.6 F (36.4 C) (Oral)   Resp 16   SpO2 100%   Physical Exam Vitals and nursing note reviewed.  Constitutional:      Appearance: He is well-developed.  HENT:     Head: Normocephalic and atraumatic.  Eyes:     Pupils: Pupils are equal, round, and reactive to light.  Neck:     Vascular: No JVD.  Cardiovascular:     Rate and Rhythm: Normal rate and regular rhythm.     Heart sounds: No murmur heard.   No friction rub. No gallop.  Pulmonary:     Effort: No respiratory distress.     Breath sounds: No wheezing.  Abdominal:     General: There is no distension.     Tenderness: There is no abdominal tenderness. There is no guarding or rebound.     Comments: No obvious abdominal discomfort.  Musculoskeletal:        General: Normal range of motion.     Cervical back: Normal range of motion and neck supple.  Skin:    Coloration: Skin is not pale.     Findings: No rash.  Neurological:     Mental Status: He is alert and oriented to person, place, and time.  Psychiatric:        Behavior: Behavior normal.    ED Results / Procedures / Treatments   Labs (all labs ordered are listed, but only abnormal results are displayed) Labs Reviewed  URINALYSIS, ROUTINE W REFLEX MICROSCOPIC - Abnormal; Notable for the following components:      Result Value   Glucose, UA 50 (*)    Hgb urine dipstick SMALL (*)    Protein, ur 100 (*)    All other components within normal limits  CBC WITH DIFFERENTIAL/PLATELET - Abnormal; Notable for the following components:   RBC 5.96  (*)    MCV 76.2 (*)    MCH 23.0 (*)    Platelets 577 (*)    Monocytes Absolute 1.2 (*)    Eosinophils Absolute 0.6 (*)    All other components within normal limits  COMPREHENSIVE METABOLIC PANEL - Abnormal; Notable for the following components:   CO2 20 (*)    BUN 27 (*)    Creatinine, Ser 1.73 (*)    GFR, Estimated 39 (*)    All other  components within normal limits  LIPASE, BLOOD - Abnormal; Notable for the following components:   Lipase 59 (*)    All other components within normal limits    EKG None  Radiology CT Renal Stone Study  Result Date: 09/20/2021 CLINICAL DATA:  Flank pain EXAM: CT ABDOMEN AND PELVIS WITHOUT CONTRAST TECHNIQUE: Multidetector CT imaging of the abdomen and pelvis was performed following the standard protocol without IV contrast. COMPARISON:  08/06/2011 FINDINGS: Lower chest: Aortic and coronary atherosclerosis. Calcified granuloma in the right middle lobe Hepatobiliary: Granulomatous calcifications.No evidence of biliary obstruction or stone. Pancreas: Unremarkable. Spleen: Granulomatous calcifications Adrenals/Urinary Tract: Negative adrenals. No hydronephrosis or ureteral stone. 4 mm left renal calculus. Moderately distended bladder above an enlarged prostate. No focal abnormality. Stomach/Bowel:  No obstruction. No visible bowel inflammation Vascular/Lymphatic: No acute vascular abnormality. Diffuse atheromatous calcification of the aorta and branch vessels. No mass or adenopathy. Reproductive:Enlarged prostate up lifting the bladder and measuring 7 cm in anterior to posterior span. Other: No ascites or pneumoperitoneum. Musculoskeletal: No acute abnormalities. 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 Biforaminal L5-S1 impingement. Generalized lumbar spine degeneration. IMPRESSION: 1. Distended urinary bladder reaching the umbilicus, associated with a very enlarged prostate. No hydronephrosis. 2.  Aortic Atherosclerosis  (ICD10-I70.0).  Coronary atherosclerosis. Electronically Signed   By: Jorje Guild M.D.   On: 09/20/2021 04:41    Procedures Procedures   Medications Ordered in ED Medications - No data to display  ED Course  I have reviewed the triage vital signs and the nursing notes.  Pertinent labs & imaging results that were available during my care of the patient were reviewed by me and considered in my medical decision making (see chart for details).    MDM Rules/Calculators/A&P                           80 yo M with a presumed chief complaints of lower abdominal discomfort.  I say presumed because I am unable to communicate with him.  He speaks [place], and I was unable to get an interpreter of that language.  Due to the language barrier obtain a laboratory evaluation and a CT scan of the abdomen pelvis.  Bladder scan UA.  Reassess.  CT with a distended bladder likely secondary to enlarged prostate.  Foley catheter placed with about 400 cc of urine output.  UA negative for infection.  Renal function is somewhat improved from baseline.  Will discharge with the Foley catheter in place.  Have him follow-up with urologist in the office.  6:11 AM:  I have discussed the diagnosis/risks/treatment options with the patient and believe the pt to be eligible for discharge home to follow-up with Urology. We also discussed returning to the ED immediately if new or worsening sx occur. We discussed the sx which are most concerning (e.g., sudden worsening pain, fever, inability to tolerate by mouth, inability to urinate) that necessitate immediate return. Medications administered to the patient during their visit and any new prescriptions provided to the patient are listed below.  Medications given during this visit Medications - No data to display   The patient appears reasonably screen and/or stabilized for discharge and I doubt any other medical condition or other [facility] requiring further screening,  evaluation, or treatment in the ED at this time prior to discharge.   Final Clinical Impression(s) / ED Diagnoses Final diagnoses:  Urinary retention    Rx / DC Orders ED Discharge  Orders     None        Deno Etienne, [place] 09/20/21 [phone]

## 2021-09-20 NOTE — ED Notes (Signed)
Effort made to contact pt's daughter's, but no answer.  He states he will call his friend from he lobby that brought him here.  Also, stated he only lives about a mile away.

## 2021-09-20 NOTE — Discharge Instructions (Signed)
Your bladder was unable to fully empty.  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  This needs to be follow up by a urologist in the office.  We are leaving a catheter in place to let your urine drain.  Try to keep the bag below the level of your bladder.  Please return for worsening pain, fever, inability to urinate.   ??????????????????????????????????????. ????????????????? prostate ???????. ????????????????????????? urologist ?????????. ?????????????????????????????????????????????????????????????. ?????????????????????????????????????????????????. ???????????????????????????????????????????, ???, ????????????. phok nyiav khongchao bosamad vang dai temthi  adchapen nyontom prostate  khongthan  ni tongdaihab kantidtamodny urologist  nai hongkan  phuakhao kamlang pony tho tholabainam pheuohai pad sauaa khongchao labai k  phanyaiam haksa thong

## 2021-09-26 ENCOUNTER — Other Ambulatory Visit: Payer: Self-pay

## 2021-09-26 ENCOUNTER — Emergency Department (HOSPITAL_COMMUNITY)
Admission: EM | Admit: 2021-09-26 | Discharge: 2021-09-26 | Disposition: A | Discharge status: Home / Self Care | Payer: Medicare Other | Attending: Student | Admitting: Student

## 2021-09-26 ENCOUNTER — Encounter (HOSPITAL_COMMUNITY): Payer: Self-pay | Admitting: *Deleted

## 2021-09-26 DIAGNOSIS — Z79899 Other long term (current) drug therapy: Secondary | ICD-10-CM | POA: Diagnosis not present

## 2021-09-26 DIAGNOSIS — Z87891 Personal history of nicotine dependence: Secondary | ICD-10-CM | POA: Insufficient documentation

## 2021-09-26 DIAGNOSIS — I2510 Atherosclerotic heart disease of native coronary artery without angina pectoris: Secondary | ICD-10-CM | POA: Diagnosis not present

## 2021-09-26 DIAGNOSIS — N1832 Chronic kidney disease, stage 3b: Secondary | ICD-10-CM | POA: Insufficient documentation

## 2021-09-26 DIAGNOSIS — R339 Retention of urine, unspecified: Secondary | ICD-10-CM | POA: Insufficient documentation

## 2021-09-26 DIAGNOSIS — R109 Unspecified abdominal pain: Secondary | ICD-10-CM | POA: Insufficient documentation

## 2021-09-26 DIAGNOSIS — I5023 Acute on chronic systolic (congestive) heart failure: Secondary | ICD-10-CM | POA: Diagnosis not present

## 2021-09-26 DIAGNOSIS — Z7902 Long term (current) use of antithrombotics/antiplatelets: Secondary | ICD-10-CM | POA: Diagnosis not present

## 2021-09-26 DIAGNOSIS — Z466 Encounter for fitting and adjustment of urinary device: Secondary | ICD-10-CM | POA: Insufficient documentation

## 2021-09-26 DIAGNOSIS — Z7982 Long term (current) use of aspirin: Secondary | ICD-10-CM | POA: Diagnosis not present

## 2021-09-26 DIAGNOSIS — I130 Hypertensive heart and chronic kidney disease with heart failure and stage 1 through stage 4 chronic kidney disease, or unspecified chronic kidney disease: Secondary | ICD-10-CM | POA: Diagnosis not present

## 2021-09-26 NOTE — ED Provider Notes (Signed)
[facility] EMERGENCY DEPARTMENT Provider Note   CSN: [id number] Arrival date & time: 09/26/21  1453     History Chief Complaint  Patient presents with   Abdominal Pain    Randy [name] is a 80 y.o. male.  Patient presents today to have his catheter removed.  Patient was seen 9/29 for urinary retention and discharged with a catheter and instructions to follow-up with urology.  Patient reports he threw away his discharge paperwork and it has been 5 days that he return to the emergency room to have the catheter removed.  He denies any issues with the catheter, pain, issues with urinary output or drainage.  He is without fever, chills, hematuria or abdominal pain.   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   CHF (congestive heart failure) ([place]) 07/01/2021   Stage 3b chronic kidney disease ([place])    Primary hypertension    Mixed hyperlipidemia    Pulmonary edema    AKI (acute kidney injury) ([place])    NSTEMI (non-ST elevated myocardial infarction) ([place])    Renal insufficiency 01/29/2021   Hypertensive crisis 01/29/2021   New onset of congestive heart failure ([place]) 01/29/2021   Acute respiratory failure with hypoxia ([place]) 01/29/2021    Past Surgical History:  Procedure Laterality Date   EYE SURGERY     IABP INSERTION N/A 01/30/2021   Procedure: IABP Insertion;  Surgeon: Leonie Man, MD;  Location: [facility];  Service: Cardiovascular;   Laterality: N/A;   RIGHT/LEFT HEART CATH AND CORONARY ANGIOGRAPHY N/A 01/30/2021   Procedure: RIGHT/LEFT HEART CATH AND CORONARY ANGIOGRAPHY;  Surgeon: Leonie Man, MD;  Location: [facility];  Service: Cardiovascular;  Laterality: N/A;       No family history on file.  Social History   Tobacco Use   Smoking status: Former    Types: Cigarettes    Home Medications Prior to Admission medications   Medication Sig Start Date End Date Taking? Authorizing Provider  acetaminophen (TYLENOL) 325 MG tablet Take 2 tablets (650 mg total) by mouth every 4 (four) hours as needed for headache or mild pain. 07/04/21   Isaiah Serge, NP  aspirin 81 MG chewable tablet Chew 1 tablet (81 mg total) by mouth daily. 02/06/21   Hosie Poisson, MD  atorvastatin (LIPITOR) 80 MG tablet Take 1 tablet (80 mg total) by mouth daily. 02/06/21   Hosie Poisson, MD  carvedilol (COREG) 6.25 MG tablet Take 1 tablet (6.25 mg total) by mouth 2 (two) times daily with a meal. 02/05/21   Hosie Poisson, MD  clopidogrel (PLAVIX) 75 MG tablet Take 1 tablet (75 mg total) by mouth daily. 02/06/21   Hosie Poisson, MD  furosemide (LASIX) 80 MG tablet Take 1 tablet (80 mg total) by mouth daily. 07/05/21   Isaiah Serge, NP  isosorbide-hydrALAZINE (BIDIL) 20-37.5 MG tablet Take 1 tablet by mouth 3 (three) times daily. 07/04/21  Isaiah Serge, NP  nitroGLYCERIN (NITROSTAT) 0.4 MG SL tablet Place 1 tablet (0.4 mg total) under the tongue every 5 (five) minutes x 3 doses as needed for chest pain. 02/05/21   Hosie Poisson, MD  pantoprazole (PROTONIX) 40 MG tablet Take 1 tablet (40 mg total) by mouth daily at 6 (six) AM. 02/06/21   Hosie Poisson, MD  tamsulosin (FLOMAX) 0.4 MG CAPS capsule Take 1 capsule (0.4 mg total) by mouth 2 (two) times daily. 07/05/21   Hilty, Nadean Corwin, MD    Allergies    Patient has no known allergies.  Review of Systems   Review of Systems  Gastrointestinal:  Negative for abdominal distention, abdominal  pain and nausea.  Genitourinary:  Negative for decreased urine volume, difficulty urinating and dysuria.   Physical Exam Updated Vital Signs BP (!) 148/64 (BP Location: Left Arm)   Pulse 77   Temp 98.8 F (37.1 C) (Oral)   Resp 16   SpO2 100%   Physical Exam Vitals and nursing note reviewed.  Constitutional:      General: He is not in acute distress.    Appearance: Normal appearance. He is not ill-appearing.  HENT:     Head: Normocephalic and atraumatic.     Nose: Nose normal.  Eyes:     Conjunctiva/sclera: Conjunctivae normal.  Pulmonary:     Effort: Pulmonary effort is normal. No respiratory distress.  Abdominal:     General: Abdomen is flat. There is no distension.     Palpations: Abdomen is soft.     Tenderness: There is no abdominal tenderness. There is no guarding or rebound.  Musculoskeletal:        General: No deformity.  Skin:    Findings: No rash.  Neurological:     Mental Status: He is alert.    ED Results / Procedures / Treatments   Labs (all labs ordered are listed, but only abnormal results are displayed) Labs Reviewed - No data to display  EKG None  Radiology No results found.  Procedures Procedures   Medications Ordered in ED Medications - No data to display  ED Course  I have reviewed the triage vital signs and the nursing notes.  Pertinent labs & imaging results that were available during my care of the patient were reviewed by me and considered in my medical decision making (see chart for details).    MDM Rules/Calculators/A&P                           Patient presents for follow-up for urinary retention to have his catheter removed.  He is without acute issues or concerns.  His bladder scan had 16 mL.  Discussed instructions to follow-up with urology using interpreter.  Instructions also discussed with patient's family.  Patient voices understanding and is in agreement. Final Clinical Impression(s) / ED Diagnoses Final diagnoses:   Urinary retention    Rx / DC Orders ED Discharge Orders     None        Evlyn Courier, PA-C 09/26/21 1655    Kommor, Boyce, MD 09/26/21 1952

## 2021-09-26 NOTE — Discharge Instructions (Addendum)
Call the urology office for a follow-up appointment.  They will evaluate you and determine if the catheter can be taken out.  Return to the emergency department if you develop any issues with the catheter or severe pain.

## 2021-09-26 NOTE — ED Triage Notes (Signed)
Pt was tx for bladder retention and sent home with a catheter and leg bag.  Pt threw away the the instructions and would like the catheter removed.  14 ml noted on bladder scan.

## 2021-10-01 ENCOUNTER — Encounter (HOSPITAL_COMMUNITY): Payer: Self-pay

## 2021-10-01 ENCOUNTER — Emergency Department (HOSPITAL_COMMUNITY)
Admission: EM | Admit: 2021-10-01 | Discharge: 2021-10-02 | Disposition: A | Discharge status: Home / Self Care | Payer: Medicare Other | Attending: Emergency Medicine | Admitting: Emergency Medicine

## 2021-10-01 ENCOUNTER — Other Ambulatory Visit: Payer: Self-pay

## 2021-10-01 DIAGNOSIS — I130 Hypertensive heart and chronic kidney disease with heart failure and stage 1 through stage 4 chronic kidney disease, or unspecified chronic kidney disease: Secondary | ICD-10-CM | POA: Diagnosis not present

## 2021-10-01 DIAGNOSIS — R300 Dysuria: Secondary | ICD-10-CM | POA: Diagnosis present

## 2021-10-01 DIAGNOSIS — I509 Heart failure, unspecified: Secondary | ICD-10-CM | POA: Insufficient documentation

## 2021-10-01 DIAGNOSIS — I2510 Atherosclerotic heart disease of native coronary artery without angina pectoris: Secondary | ICD-10-CM | POA: Diagnosis not present

## 2021-10-01 DIAGNOSIS — N1832 Chronic kidney disease, stage 3b: Secondary | ICD-10-CM | POA: Insufficient documentation

## 2021-10-01 DIAGNOSIS — Z87891 Personal history of nicotine dependence: Secondary | ICD-10-CM | POA: Insufficient documentation

## 2021-10-01 DIAGNOSIS — Z7902 Long term (current) use of antithrombotics/antiplatelets: Secondary | ICD-10-CM | POA: Diagnosis not present

## 2021-10-01 DIAGNOSIS — N3001 Acute cystitis with hematuria: Secondary | ICD-10-CM | POA: Insufficient documentation

## 2021-10-01 DIAGNOSIS — Z79899 Other long term (current) drug therapy: Secondary | ICD-10-CM | POA: Diagnosis not present

## 2021-10-01 DIAGNOSIS — Z7982 Long term (current) use of aspirin: Secondary | ICD-10-CM | POA: Insufficient documentation

## 2021-10-01 LAB — URINALYSIS, ROUTINE W REFLEX MICROSCOPIC
Glucose, UA: NEGATIVE mg/dL
Ketones, ur: NEGATIVE mg/dL
Nitrite: POSITIVE — AB
Protein, ur: >300 mg/dL — AB
Specific Gravity, Urine: 1.025 (ref 1.005–1.030)
pH: 6.5 (ref 5.0–8.0)

## 2021-10-01 LAB — CBC WITH DIFFERENTIAL/PLATELET
Abs Immature Granulocytes: 0.09 10*3/uL — ABNORMAL HIGH (ref 0.00–0.07)
Basophils Absolute: 0.1 10*3/uL (ref 0.0–0.1)
Basophils Relative: 0 %
Eosinophils Absolute: 0.1 10*3/uL (ref 0.0–0.5)
Eosinophils Relative: 1 %
HCT: 39.5 % (ref 39.0–52.0)
Hemoglobin: 11.7 g/dL — ABNORMAL LOW (ref 13.0–17.0)
Immature Granulocytes: 1 %
Lymphocytes Relative: 6 %
Lymphs Abs: 1.1 10*3/uL (ref 0.7–4.0)
MCH: 22.9 pg — ABNORMAL LOW (ref 26.0–34.0)
MCHC: 29.6 g/dL — ABNORMAL LOW (ref 30.0–36.0)
MCV: 77.1 fL — ABNORMAL LOW (ref 80.0–100.0)
Monocytes Absolute: 1.7 10*3/uL — ABNORMAL HIGH (ref 0.1–1.0)
Monocytes Relative: 10 %
Neutro Abs: 14.1 10*3/uL — ABNORMAL HIGH (ref 1.7–7.7)
Neutrophils Relative %: 82 %
Platelets: 719 10*3/uL — ABNORMAL HIGH (ref 150–400)
RBC: 5.12 MIL/uL (ref 4.22–5.81)
RDW: 15.3 % (ref 11.5–15.5)
WBC: 17.1 10*3/uL — ABNORMAL HIGH (ref 4.0–10.5)
nRBC: 0 % (ref 0.0–0.2)

## 2021-10-01 LAB — URINALYSIS, MICROSCOPIC (REFLEX)
RBC / HPF: >50 RBC/hpf (ref 0–5)
Squamous Epithelial / HPF: NONE SEEN (ref 0–5)
WBC, UA: >50 WBC/hpf (ref 0–5)

## 2021-10-01 LAB — COMPREHENSIVE METABOLIC PANEL
ALT: 17 U/L (ref 0–44)
AST: 20 U/L (ref 15–41)
Albumin: 3.3 g/dL — ABNORMAL LOW (ref 3.5–5.0)
Alkaline Phosphatase: 63 U/L (ref 38–126)
Anion gap: 10 (ref 5–15)
BUN: 33 mg/dL — ABNORMAL HIGH (ref 8–23)
CO2: 26 mmol/L (ref 22–32)
Calcium: 9.1 mg/dL (ref 8.9–10.3)
Chloride: 103 mmol/L (ref 98–111)
Creatinine, Ser: 1.64 mg/dL — ABNORMAL HIGH (ref 0.61–1.24)
GFR, Estimated: 42 mL/min — ABNORMAL LOW (ref >60)
Glucose, Bld: 125 mg/dL — ABNORMAL HIGH (ref 70–99)
Potassium: 4.8 mmol/L (ref 3.5–5.1)
Sodium: 139 mmol/L (ref 135–145)
Total Bilirubin: 0.6 mg/dL (ref 0.3–1.2)
Total Protein: 7.3 g/dL (ref 6.5–8.1)

## 2021-10-01 MED ORDER — CEFPODOXIME PROXETIL 100 MG PO TABS: 100.0000 mg | ORAL_TABLET | Freq: Two times a day (BID) | ORAL | 0 refills | Status: AC

## 2021-10-01 NOTE — ED Triage Notes (Signed)
Patient here with requesting urinary catheter be removed, complains of pain with movement. Was placed on 10/6 for retention and has not followed up with urology

## 2021-10-01 NOTE — Discharge Instructions (Addendum)
??? ???? ??? ????   catheter ??? ??? ??? ?? ??? ???.  ??????????????????????????????urology ????????????????????????????????.  ??????????????????????????????????.  ??? ???? ???? ??? ??? ??? ???? ??? ?? ??? ??? ????? ??? ??? ??? ???.  ???????????????? urine ???????????????????????.  ????????????????????????????????????????????????.  ?????????????? 1 ????????????????????? 1 ???????????????????.  ??? ?? ??? ???? ??? ?? ???? ???? 7 ???.  ???????????????????????????????????????????????????????????????????????????????  ??? ??? ?? ?? ?? ??? ??? ???? ??? ??? ??? ???? ??? ?? ??? urinate ??? , ????? ?? ?? ??? ???? ???? ???? ??? ??? ???? ??? ??? ???? ??? ??? ???? ?? ??? ?? ??? ??? ???? .  ??????????????????????????????????????????????????????????????????????????????.

## 2021-10-01 NOTE — ED Notes (Signed)
No answer in lobby.

## 2021-10-01 NOTE — ED Provider Notes (Signed)
[facility] EMERGENCY DEPARTMENT Provider Note   CSN: [id number] Arrival date & time: 10/01/21  1131     History Chief Complaint  Patient presents with   Dysuria    Worthy Nims is a 80 y.o. male with a past medical history of coronary artery disease, CHF, hypertension and CKD stage III presenting today with a complaint of dysuria.  Patient was evaluated with the help of telephone interpreter.  Per patient and per chart review patient had a catheter which placed on 9/29 for urinary retention.  He was to follow-up with urology however it does not appear he did so.  He was then seen on 10/6 and asked to have the catheter removed however they told him to please follow-up with urology and left a Foley catheter in place.  Today he is here with pain and dark urine coming from his Foley catheter.  Denies any fevers or chills.  Endorses lower abdominal pain.  Penile pain only present when his penis or the catheter are manipulated.      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   CHF (congestive heart failure) ([place]) 07/01/2021   Stage 3b chronic kidney disease ([place])    Primary hypertension    Mixed hyperlipidemia    Pulmonary edema    AKI (acute kidney injury) ([place])    NSTEMI (non-ST elevated myocardial infarction) ([place])    Renal insufficiency 01/29/2021   Hypertensive crisis 01/29/2021   New onset of congestive heart failure ([place]) 01/29/2021   Acute respiratory failure with hypoxia ([address]) 01/29/2021    Past Surgical  History:  Procedure Laterality Date   EYE SURGERY     IABP INSERTION N/A 01/30/2021   Procedure: IABP Insertion;  Surgeon: Leonie Man, MD;  Location: [facility];  Service: Cardiovascular;  Laterality: N/A;   RIGHT/LEFT HEART CATH AND CORONARY ANGIOGRAPHY N/A 01/30/2021   Procedure: RIGHT/LEFT HEART CATH AND CORONARY ANGIOGRAPHY;  Surgeon: Leonie Man, MD;  Location: [facility];  Service: Cardiovascular;  Laterality: N/A;       No family history on file.  Social History   Tobacco Use   Smoking status: Former    Types: Cigarettes    Home Medications Prior to Admission medications   Medication Sig Start Date End Date Taking? Authorizing Provider  acetaminophen (TYLENOL) 325 MG tablet Take 2 tablets (650 mg total) by mouth every 4 (four) hours as needed for headache or mild pain. 07/04/21   Isaiah Serge, NP  aspirin 81 MG chewable tablet Chew 1 tablet (81 mg total) by mouth daily. 02/06/21   Hosie Poisson, MD  atorvastatin (LIPITOR) 80 MG tablet Take 1 tablet (80 mg total) by mouth daily. 02/06/21   Hosie Poisson, MD  carvedilol (COREG) 6.25 MG tablet Take 1 tablet (6.25 mg total) by mouth 2 (two) times daily with a meal. 02/05/21   Hosie Poisson, MD  clopidogrel (PLAVIX) 75 MG tablet Take 1 tablet (75 mg total) by mouth daily. 02/06/21   Hosie Poisson,  MD  furosemide (LASIX) 80 MG tablet Take 1 tablet (80 mg total) by mouth daily. 07/05/21   Isaiah Serge, NP  isosorbide-hydrALAZINE (BIDIL) 20-37.5 MG tablet Take 1 tablet by mouth 3 (three) times daily. 07/04/21   Isaiah Serge, NP  nitroGLYCERIN (NITROSTAT) 0.4 MG SL tablet Place 1 tablet (0.4 mg total) under the tongue every 5 (five) minutes x 3 doses as needed for chest pain. 02/05/21   Hosie Poisson, MD  pantoprazole (PROTONIX) 40 MG tablet Take 1 tablet (40 mg total) by mouth daily at 6 (six) AM. 02/06/21   Hosie Poisson, MD  tamsulosin (FLOMAX) 0.4 MG CAPS capsule Take 1 capsule (0.4 mg total) by mouth 2 (two)  times daily. 07/05/21   Hilty, Nadean Corwin, MD    Allergies    Patient has no known allergies.  Review of Systems   Review of Systems  Constitutional:  Negative for chills and fever.  Gastrointestinal:  Positive for abdominal pain.  Genitourinary:  Positive for dysuria, hematuria and penile pain. Negative for difficulty urinating, penile discharge, penile swelling, scrotal swelling and testicular pain.  Skin:  Negative for wound.  All other systems reviewed and are negative.  Physical Exam Updated Vital Signs BP (!) 161/53 (BP Location: Left Arm)   Pulse 64   Temp 99 F (37.2 C) (Oral)   Resp 18   SpO2 100%   Physical Exam Vitals and nursing note reviewed.  Constitutional:      Appearance: Normal appearance.  HENT:     Head: Normocephalic and atraumatic.  Eyes:     General: No scleral icterus.    Conjunctiva/sclera: Conjunctivae normal.  Pulmonary:     Effort: Pulmonary effort is normal. No respiratory distress.  Abdominal:     General: Abdomen is flat.     Palpations: There is no mass.     Tenderness: There is abdominal tenderness.     Hernia: No hernia is present.  Genitourinary:    Comments: Uncircumcised penis.  Copious amounts of pus revealed surrounding urinary meatus.  Foley catheter still in place.  No erythema or inflammation. Skin:    Findings: No rash.  Neurological:     Mental Status: He is alert.  Psychiatric:        Mood and Affect: Mood normal.    ED Results / Procedures / Treatments   Labs (all labs ordered are listed, but only abnormal results are displayed) Labs Reviewed  COMPREHENSIVE METABOLIC PANEL - Abnormal; Notable for the following components:      Result Value   Glucose, Bld 125 (*)    BUN 33 (*)    Creatinine, Ser 1.64 (*)    Albumin 3.3 (*)    GFR, Estimated 42 (*)    All other components within normal limits  CBC WITH DIFFERENTIAL/PLATELET - Abnormal; Notable for the following components:   WBC 17.1 (*)    Hemoglobin 11.7 (*)     MCV 77.1 (*)    MCH 22.9 (*)    MCHC 29.6 (*)    Platelets 719 (*)    Neutro Abs 14.1 (*)    Monocytes Absolute 1.7 (*)    Abs Immature Granulocytes 0.09 (*)    All other components within normal limits  URINALYSIS, ROUTINE W REFLEX MICROSCOPIC    EKG None  Radiology No results found.  Procedures Procedures   Medications Ordered in ED Medications - No data to display  ED Course  I have reviewed the triage vital signs and the nursing  notes.  Pertinent labs & imaging results that were available during my care of the patient were reviewed by me and considered in my medical decision making (see chart for details).  Clinical Course as of 10/02/21 1724  Tue Oct 01, 2021  1952 Multiple calls placed to daughter to discuss patient's status.  No answer. [MR]    Clinical Course User Index [MR] Lavante Toso, Kelby Aline   MDM Rules/Calculators/A&P Patient was evaluated by me and MD Regenia Skeeter with telephone interpreter.  Patient is requesting removal of Foley catheter due to discomfort.  Upon visualization the urethral meatus was with large amounts of yellow/green discharge.  Urine in Foley catheter bag was dark red.  I reviewed the patient's chart and saw that he attempted to have this Foley catheter removed by her department 5 days ago.  He was told to follow-up with urology.  He did not do this and is here again with the request to remove the Foley catheter.  His white blood cell count was found to be 17.  Urinalysis revealed infection as well as hematuria.  Patient afebrile and alert and oriented.  He will discharge home on cefpodoxime if he is able to urinate in the department today.  Patient able to urinate around 11 PM.  Stable for discharge however he is not allowed to drive at nighttime due to his age and vision.  He will be discharged home with a taxi voucher or bus pass.  This was explained to the patient via telephone interpreter.  He also understands that he must take  antibiotics for this infection.  Instructions translated into his discharge papers.  Final Clinical Impression(s) / ED Diagnoses Final diagnoses:  Acute cystitis with hematuria    Rx / DC Orders I attached instructions and information to patient's discharge papers.  These were translated into [place] so that the patient would understand.  He has been instructed to follow-up with his primary care provider at this time.     Rhae Hammock, PA-C 10/02/21 1724    Sherwood Gambler, MD 10/04/21 [phone]

## 2021-10-01 NOTE — ED Provider Notes (Signed)
Emergency Medicine Provider Triage Evaluation Note  Randy [name] , a 80 y.o. male  was evaluated in triage.  Pt complains of pain in his urinary catheter.  Patient was seen on 10/6 in the emergency department to have his urinary catheter removed.  He was seen on 9/29 for urinary retention and discharged with a catheter and instructions to follow-up with urology.  At that time his bladder scan had 16 mL and he was instructed to follow-up with urology to determine if the catheter is ready to be removed.  He denies any fevers, abdominal pain  Review of Systems  Positive: Penile pain at catheter site Negative: Fever  Physical Exam  BP (!) 163/77 (BP Location: Right Arm)   Pulse 74   Temp 99 F (37.2 C) (Oral)   Resp 14   SpO2 97%  Gen:   Awake, no distress   Resp:  Normal effort  MSK:   Moves extremities without difficulty  Other:  GU exam done with chaperone present.  Appears to have hypospadias with urinary catheter present.  There is a small amount of white discharge coming from around the catheter.  He has pain with any manipulation of the catheter or his penis.  There is no redness, swelling.  Urinary catheter with pink-tinged urine and small flecks of blood.  Medical Decision Making  Medically screening exam initiated at 12:07 PM.  Appropriate orders placed.  Randy [name] was informed that the remainder of the evaluation will be completed by another provider, this initial triage assessment does not replace that evaluation, and the importance of remaining in the ED until their evaluation is complete.     Mickie Hillier, PA-C 10/01/21 1209    Wyvonnia Dusky, MD 10/01/21 [phone]

## 2021-10-02 NOTE — ED Notes (Signed)
Patient able to urinate on own. Discharge instructions reviewed with patient. Patient denies any further questions. Cab voucher provided for patient for ride home.

## 2021-10-05 LAB — URINE CULTURE: Culture: >=100000 — AB

## 2021-10-06 ENCOUNTER — Telehealth: Payer: Self-pay | Admitting: Emergency Medicine

## 2021-10-06 NOTE — Progress Notes (Signed)
ED Antimicrobial Stewardship Positive Culture Follow Up   Randy [name] is an 80 y.o. male who presented to [facility] on 10/01/2021 with a chief complaint of  Chief Complaint  Patient presents with   Dysuria    Recent Results (from the past 720 hour(s))  Urine Culture     Status: Abnormal   Collection Time: 10/01/21  6:39 PM   Specimen: Urine, Catheterized  Result Value Ref Range Status   Specimen Description URINE, CATHETERIZED  Final   Special Requests   Final    NONE Performed at [facility], [address] [place], [place] [address]    Culture (A)  Final    >=100,000 COLONIES/mL KLEBSIELLA PNEUMONIAE 40,000 COLONIES/mL ENTEROCOCCUS FAECALIS    Report Status 10/05/2021 FINAL  Final   Organism ID, Bacteria KLEBSIELLA PNEUMONIAE (A)  Final   Organism ID, Bacteria ENTEROCOCCUS FAECALIS (A)  Final      Susceptibility   Enterococcus faecalis - MIC*    AMPICILLIN <=2 SENSITIVE Sensitive     NITROFURANTOIN <=16 SENSITIVE Sensitive     VANCOMYCIN 1 SENSITIVE Sensitive     * 40,000 COLONIES/mL ENTEROCOCCUS FAECALIS   Klebsiella pneumoniae - MIC*    AMPICILLIN >=32 RESISTANT Resistant     CEFAZOLIN <=4 SENSITIVE Sensitive     CEFEPIME <=0.12 SENSITIVE Sensitive     CEFTRIAXONE <=0.25 SENSITIVE Sensitive     CIPROFLOXACIN <=0.25 SENSITIVE Sensitive     GENTAMICIN <=1 SENSITIVE Sensitive     IMIPENEM <=0.25 SENSITIVE Sensitive     NITROFURANTOIN 128 RESISTANT Resistant     TRIMETH/SULFA <=20 SENSITIVE Sensitive     AMPICILLIN/SULBACTAM 16 INTERMEDIATE Intermediate     PIP/TAZO <=4 SENSITIVE Sensitive     * >=100,000 COLONIES/mL KLEBSIELLA PNEUMONIAE    Plan: Symptom check as infection appears to be largely Klebsiella, if not improving add Amoxil course   ED Provider: Benedetto Goad, Reece Leader 10/06/2021, 10:32 AM Clinical Pharmacist Monday - Friday phone -  [phone] Saturday - Sunday phone - [phone]

## 2021-10-06 NOTE — Telephone Encounter (Signed)
Post ED Visit - Positive Culture Follow-up: Unsuccessful Patient Follow-up  Culture assessed and recommendations reviewed by:  []  Elenor Quinones, Pharm.D. []  Heide Guile, Pharm.D., BCPS AQ-ID []  Parks Neptune, Pharm.D., BCPS []  Alycia Rossetti, Pharm.D., BCPS []  [place], [place].D., BCPS, AAHIVP []  Legrand Como, Pharm.D., BCPS, AAHIVP [x]  Bertis Ruddy, PharmD []  Vincenza Hews, PharmD, BCPS  Positive urine culture  []  Patient discharged without antimicrobial prescription and treatment is now indicated []  Organism is resistant to prescribed ED discharge antimicrobial []  Patient with positive blood cultures   Unable to contact patient by phone, letter will be sent to address on file.   Plan: Symptom check If doing well continue as before If not, Add Amoxil 500 mg TID for five days - Benedetto Goad, [place]  Milus Mallick RN 10/06/2021, 6:31 PM

## 2021-11-15 ENCOUNTER — Inpatient Hospital Stay (HOSPITAL_COMMUNITY)
Admission: EM | Admit: 2021-11-15 | Discharge: 2021-11-20 | DRG: 291 | Disposition: A | Discharge status: Home / Self Care | Payer: Medicare Other | Attending: Internal Medicine | Admitting: Internal Medicine

## 2021-11-15 ENCOUNTER — Emergency Department (HOSPITAL_COMMUNITY): Payer: Medicare Other

## 2021-11-15 ENCOUNTER — Encounter (HOSPITAL_COMMUNITY): Payer: Self-pay

## 2021-11-15 ENCOUNTER — Other Ambulatory Visit: Payer: Self-pay

## 2021-11-15 ENCOUNTER — Observation Stay (HOSPITAL_COMMUNITY): Payer: Medicare Other

## 2021-11-15 DIAGNOSIS — I10 Essential (primary) hypertension: Secondary | ICD-10-CM | POA: Diagnosis present

## 2021-11-15 DIAGNOSIS — E785 Hyperlipidemia, unspecified: Secondary | ICD-10-CM | POA: Diagnosis present

## 2021-11-15 DIAGNOSIS — I130 Hypertensive heart and chronic kidney disease with heart failure and stage 1 through stage 4 chronic kidney disease, or unspecified chronic kidney disease: Principal | ICD-10-CM | POA: Diagnosis present

## 2021-11-15 DIAGNOSIS — Z20822 Contact with and (suspected) exposure to covid-19: Secondary | ICD-10-CM | POA: Diagnosis present

## 2021-11-15 DIAGNOSIS — I5021 Acute systolic (congestive) heart failure: Secondary | ICD-10-CM

## 2021-11-15 DIAGNOSIS — Z79899 Other long term (current) drug therapy: Secondary | ICD-10-CM

## 2021-11-15 DIAGNOSIS — B961 Klebsiella pneumoniae [K. pneumoniae] as the cause of diseases classified elsewhere: Secondary | ICD-10-CM | POA: Diagnosis present

## 2021-11-15 DIAGNOSIS — D509 Iron deficiency anemia, unspecified: Secondary | ICD-10-CM | POA: Diagnosis present

## 2021-11-15 DIAGNOSIS — N390 Urinary tract infection, site not specified: Secondary | ICD-10-CM | POA: Diagnosis present

## 2021-11-15 DIAGNOSIS — Z87891 Personal history of nicotine dependence: Secondary | ICD-10-CM

## 2021-11-15 DIAGNOSIS — Z7902 Long term (current) use of antithrombotics/antiplatelets: Secondary | ICD-10-CM

## 2021-11-15 DIAGNOSIS — N1832 Chronic kidney disease, stage 3b: Secondary | ICD-10-CM | POA: Diagnosis present

## 2021-11-15 DIAGNOSIS — N179 Acute kidney failure, unspecified: Secondary | ICD-10-CM

## 2021-11-15 DIAGNOSIS — I5023 Acute on chronic systolic (congestive) heart failure: Secondary | ICD-10-CM

## 2021-11-15 DIAGNOSIS — N401 Enlarged prostate with lower urinary tract symptoms: Secondary | ICD-10-CM | POA: Diagnosis present

## 2021-11-15 DIAGNOSIS — D649 Anemia, unspecified: Secondary | ICD-10-CM

## 2021-11-15 DIAGNOSIS — I2510 Atherosclerotic heart disease of native coronary artery without angina pectoris: Secondary | ICD-10-CM | POA: Diagnosis present

## 2021-11-15 DIAGNOSIS — B952 Enterococcus as the cause of diseases classified elsewhere: Secondary | ICD-10-CM | POA: Diagnosis present

## 2021-11-15 DIAGNOSIS — Z7982 Long term (current) use of aspirin: Secondary | ICD-10-CM

## 2021-11-15 DIAGNOSIS — R338 Other retention of urine: Secondary | ICD-10-CM | POA: Diagnosis present

## 2021-11-15 DIAGNOSIS — H9190 Unspecified hearing loss, unspecified ear: Secondary | ICD-10-CM | POA: Diagnosis present

## 2021-11-15 DIAGNOSIS — I509 Heart failure, unspecified: Secondary | ICD-10-CM

## 2021-11-15 DIAGNOSIS — R0602 Shortness of breath: Secondary | ICD-10-CM

## 2021-11-15 DIAGNOSIS — D75839 Thrombocytosis, unspecified: Secondary | ICD-10-CM | POA: Diagnosis present

## 2021-11-15 LAB — FOLATE: Folate: 13.4 ng/mL (ref >5.9)

## 2021-11-15 LAB — RETICULOCYTES
Immature Retic Fract: 34.1 % — ABNORMAL HIGH (ref 2.3–15.9)
RBC.: 4.22 MIL/uL (ref 4.22–5.81)
Retic Count, Absolute: 93.7 10*3/uL (ref 19.0–186.0)
Retic Ct Pct: 2.2 % (ref 0.4–3.1)

## 2021-11-15 LAB — BASIC METABOLIC PANEL
Anion gap: 10 (ref 5–15)
BUN: 52 mg/dL — ABNORMAL HIGH (ref 8–23)
CO2: 22 mmol/L (ref 22–32)
Calcium: 8.7 mg/dL — ABNORMAL LOW (ref 8.9–10.3)
Chloride: 103 mmol/L (ref 98–111)
Creatinine, Ser: 2.32 mg/dL — ABNORMAL HIGH (ref 0.61–1.24)
GFR, Estimated: 27 mL/min — ABNORMAL LOW (ref >60)
Glucose, Bld: 135 mg/dL — ABNORMAL HIGH (ref 70–99)
Potassium: 4.7 mmol/L (ref 3.5–5.1)
Sodium: 135 mmol/L (ref 135–145)

## 2021-11-15 LAB — CBC WITH DIFFERENTIAL/PLATELET
Abs Immature Granulocytes: 0.1 10*3/uL — ABNORMAL HIGH (ref 0.00–0.07)
Basophils Absolute: 0 10*3/uL (ref 0.0–0.1)
Basophils Relative: 0 %
Eosinophils Absolute: 0.2 10*3/uL (ref 0.0–0.5)
Eosinophils Relative: 2 %
HCT: 30.2 % — ABNORMAL LOW (ref 39.0–52.0)
Hemoglobin: 8.9 g/dL — ABNORMAL LOW (ref 13.0–17.0)
Immature Granulocytes: 1 %
Lymphocytes Relative: 10 %
Lymphs Abs: 1.2 10*3/uL (ref 0.7–4.0)
MCH: 21 pg — ABNORMAL LOW (ref 26.0–34.0)
MCHC: 29.5 g/dL — ABNORMAL LOW (ref 30.0–36.0)
MCV: 71.2 fL — ABNORMAL LOW (ref 80.0–100.0)
Monocytes Absolute: 1.2 10*3/uL — ABNORMAL HIGH (ref 0.1–1.0)
Monocytes Relative: 10 %
Neutro Abs: 8.8 10*3/uL — ABNORMAL HIGH (ref 1.7–7.7)
Neutrophils Relative %: 77 %
Platelets: 675 10*3/uL — ABNORMAL HIGH (ref 150–400)
RBC: 4.24 MIL/uL (ref 4.22–5.81)
RDW: 17.2 % — ABNORMAL HIGH (ref 11.5–15.5)
WBC: 11.6 10*3/uL — ABNORMAL HIGH (ref 4.0–10.5)
nRBC: 0.2 % (ref 0.0–0.2)

## 2021-11-15 LAB — PROCALCITONIN: Procalcitonin: 0.24 ng/mL

## 2021-11-15 LAB — URINALYSIS, ROUTINE W REFLEX MICROSCOPIC
Bilirubin Urine: NEGATIVE
Glucose, UA: NEGATIVE mg/dL
Hgb urine dipstick: NEGATIVE
Ketones, ur: NEGATIVE mg/dL
Nitrite: NEGATIVE
Protein, ur: NEGATIVE mg/dL
Specific Gravity, Urine: 1.006 (ref 1.005–1.030)
WBC, UA: >50 WBC/hpf — ABNORMAL HIGH (ref 0–5)
pH: 5 (ref 5.0–8.0)

## 2021-11-15 LAB — RESP PANEL BY RT-PCR (FLU A&B, COVID) ARPGX2
Influenza A by PCR: NEGATIVE
Influenza B by PCR: NEGATIVE
SARS Coronavirus 2 by RT PCR: NEGATIVE

## 2021-11-15 LAB — FERRITIN: Ferritin: 29 ng/mL (ref 24–336)

## 2021-11-15 LAB — IRON AND TIBC
Iron: 19 ug/dL — ABNORMAL LOW (ref 45–182)
Saturation Ratios: 4 % — ABNORMAL LOW (ref 17.9–39.5)
TIBC: 456 ug/dL — ABNORMAL HIGH (ref 250–450)
UIBC: 437 ug/dL

## 2021-11-15 LAB — BRAIN NATRIURETIC PEPTIDE: B Natriuretic Peptide: 4152.3 pg/mL — ABNORMAL HIGH (ref 0.0–100.0)

## 2021-11-15 LAB — TYPE AND SCREEN
ABO/RH(D): B POS
Antibody Screen: NEGATIVE

## 2021-11-15 LAB — VITAMIN B12: Vitamin B-12: 214 pg/mL (ref 180–914)

## 2021-11-15 LAB — POC OCCULT BLOOD, ED: Fecal Occult Bld: NEGATIVE

## 2021-11-15 MED ORDER — DOCUSATE SODIUM 100 MG PO CAPS
100.0000 mg | ORAL_CAPSULE | Freq: Two times a day (BID) | ORAL | Status: DC
  Administered 2021-11-15 – 2021-11-19 (×9): 100 mg via ORAL
  Filled 2021-11-15 (×11): qty 1

## 2021-11-15 MED ORDER — FUROSEMIDE 10 MG/ML IJ SOLN
40.0000 mg | Freq: Once | INTRAMUSCULAR | Status: AC
  Administered 2021-11-15: 40 mg via INTRAVENOUS
  Filled 2021-11-15: qty 4

## 2021-11-15 MED ORDER — SODIUM CHLORIDE 0.9 % IV SOLN
1.0000 g | INTRAVENOUS | Status: DC
  Administered 2021-11-15 – 2021-11-17 (×3): 1 g via INTRAVENOUS
  Filled 2021-11-15 (×4): qty 10

## 2021-11-15 MED ORDER — ACETAMINOPHEN 650 MG RE SUPP: 650.0000 mg | Freq: Four times a day (QID) | RECTAL | Status: DC | PRN

## 2021-11-15 MED ORDER — MORPHINE SULFATE (PF) 2 MG/ML IV SOLN
2.0000 mg | Freq: Once | INTRAVENOUS | Status: AC
  Administered 2021-11-15: 2 mg via INTRAVENOUS
  Filled 2021-11-15: qty 1

## 2021-11-15 MED ORDER — ACETAMINOPHEN 325 MG PO TABS
650.0000 mg | ORAL_TABLET | Freq: Four times a day (QID) | ORAL | Status: DC | PRN
  Administered 2021-11-15 – 2021-11-17 (×3): 650 mg via ORAL
  Filled 2021-11-15 (×3): qty 2

## 2021-11-15 MED ORDER — FUROSEMIDE 10 MG/ML IJ SOLN
40.0000 mg | Freq: Two times a day (BID) | INTRAMUSCULAR | Status: DC
  Administered 2021-11-15 – 2021-11-18 (×6): 40 mg via INTRAVENOUS
  Filled 2021-11-15 (×6): qty 4

## 2021-11-15 MED ORDER — FERROUS SULFATE 325 (65 FE) MG PO TABS
325.0000 mg | ORAL_TABLET | Freq: Every day | ORAL | Status: DC
  Administered 2021-11-16 – 2021-11-20 (×5): 325 mg via ORAL
  Filled 2021-11-15 (×5): qty 1

## 2021-11-15 NOTE — Progress Notes (Incomplete)
°  Echocardiogram 2D Echocardiogram has not been performed, patient moving off the floor. Will re-attempt at another time.  Darlina Sicilian M 11/15/2021, 1:48 PM

## 2021-11-15 NOTE — Progress Notes (Signed)
Reached out to MD pt c/o penile pain. Tylenol given. Asked provider to come to bedside to discuss patient care. . Pt consented for me to talk with son and daughter and to allow me to contact them.

## 2021-11-15 NOTE — ED Triage Notes (Signed)
Patient arrives from home with complaint of difficulty breathing x 2 days. Pt also endorses cough. EMS reports lung fields clear.  EMS vitals SPO2 96% BP 142/70 HR 76 CBG 183  T 97.7

## 2021-11-15 NOTE — H&P (Addendum)
History and Physical    Randy [name] ONG:[id number] DOB: 1941-10-02 DOA: 11/15/2021  PCP: Jilda Panda, MD  Patient coming from: Home  Chief Complaint: "I can't breathe. I can't pee."  HPI: Randy [name] is a 80 y.o. male with medical history significant of chronic systolic HF, CAD, HTN. Presenting with dyspnea. History w/ language interpreter use. No family available at this time. His symptoms started 2 days ago. He noticed that he was having a cough. It seemed as if his breathing was getting worse. He was having difficulty lying flat. He reports some subjective fevers (can't tell me temp). He tried APAP and that seemed to take away the fever. Yesterday he noticed that he was peeing less frequently. His breathing worsened, so he decided to come to the ED for help. He denies any other aggravating or alleviating factors.  ED Course: CXR showed pulm vasc congestion. He was given lasix. COVID/flu was negative. TRH was called for admission.   Review of Systems:  Review of systems is otherwise negative for all not mentioned in HPI.   PMHx Past Medical History:  Diagnosis Date   Acute on chronic HFrEF (heart failure with reduced ejection fraction) ([place]) 07/01/2021   CAD, 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   Hearing loss    HTN (hypertension)    Hypoxia 07/01/2021    PSHx Past Surgical History:  Procedure Laterality Date   EYE SURGERY     IABP INSERTION N/A 01/30/2021   Procedure: IABP Insertion;  Surgeon: Leonie Man, MD;  Location: [facility];  Service: Cardiovascular;  Laterality: N/A;   RIGHT/LEFT HEART CATH AND CORONARY ANGIOGRAPHY N/A 01/30/2021   Procedure: RIGHT/LEFT HEART CATH AND CORONARY ANGIOGRAPHY;  Surgeon: Leonie Man, MD;  Location: [facility];  Service: Cardiovascular;  Laterality: N/A;    SocHx  reports that he has quit smoking. His smoking use included cigarettes. He does not have any smokeless tobacco history on file. No history on file for  alcohol use and drug use.  No Known Allergies  FamHx History reviewed. No pertinent family history.  Prior to Admission medications   Medication Sig Start Date End Date Taking? Authorizing Provider  acetaminophen (TYLENOL) 325 MG tablet Take 2 tablets (650 mg total) by mouth every 4 (four) hours as needed for headache or mild pain. 07/04/21   Isaiah Serge, NP  aspirin 81 MG chewable tablet Chew 1 tablet (81 mg total) by mouth daily. 02/06/21   Hosie Poisson, MD  atorvastatin (LIPITOR) 80 MG tablet Take 1 tablet (80 mg total) by mouth daily. 02/06/21   Hosie Poisson, MD  carvedilol (COREG) 6.25 MG tablet Take 1 tablet (6.25 mg total) by mouth 2 (two) times daily with a meal. 02/05/21   Hosie Poisson, MD  clopidogrel (PLAVIX) 75 MG tablet Take 1 tablet (75 mg total) by mouth daily. 02/06/21   Hosie Poisson, MD  furosemide (LASIX) 80 MG tablet Take 1 tablet (80 mg total) by mouth daily. 07/05/21   Isaiah Serge, NP  isosorbide-hydrALAZINE (BIDIL) 20-37.5 MG tablet Take 1 tablet by mouth 3 (three) times daily. 07/04/21   Isaiah Serge, NP  nitroGLYCERIN (NITROSTAT) 0.4 MG SL tablet Place 1 tablet (0.4 mg total) under the tongue every 5 (five) minutes x 3 doses as needed for chest pain. 02/05/21   Hosie Poisson, MD  pantoprazole (PROTONIX) 40 MG tablet Take 1 tablet (40 mg total) by mouth daily at 6 (six) AM. 02/06/21   Hosie Poisson, MD  tamsulosin (  FLOMAX) 0.4 MG CAPS capsule Take 1 capsule (0.4 mg total) by mouth 2 (two) times daily. 07/05/21   Pixie Casino, MD    Physical Exam: Vitals:   11/15/21 0500 11/15/21 0530 11/15/21 0600 11/15/21 0630  BP: (!) 124/51 (!) 142/69    Pulse: 72 66 80 73  Resp: 13 17 (!) 26 (!) 26  Temp:      TempSrc:      SpO2: 100% 100% 100% 100%  Weight:      Height:        General: 80 y.o. male resting in bed in NAD Eyes: PERRL, normal sclera ENMT: Nares patent w/o discharge, orophaynx clear, dentition poor, ears w/o discharge/lesions/ulcers Neck: Supple,  trachea midline Cardiovascular: RRR, +S1, S2, no m/g/r, equal pulses throughout Respiratory: decreased at bases, somewhat breathless w/ speech GI: BS+, NDNT, no masses noted, no organomegaly noted MSK: No e/c/c Neuro: A&O x name only, no focal deficits Psyc: Appropriate interaction and affect, calm/cooperative  Labs on Admission: I have personally reviewed following labs and imaging studies  CBC: Recent Labs  Lab 11/15/21 0349  WBC 11.6*  NEUTROABS 8.8*  HGB 8.9*  HCT 30.2*  MCV 71.2*  PLT 235*   Basic Metabolic Panel: Recent Labs  Lab 11/15/21 0349  NA 135  K 4.7  CL 103  CO2 22  GLUCOSE 135*  BUN 52*  CREATININE 2.32*  CALCIUM 8.7*   GFR: Estimated Creatinine Clearance: 17.5 mL/min (A) (by C-G formula based on SCr of 2.32 mg/dL (H)). Liver Function Tests: No results for input(s): AST, ALT, ALKPHOS, BILITOT, PROT, ALBUMIN in the last 168 hours. No results for input(s): LIPASE, AMYLASE in the last 168 hours. No results for input(s): AMMONIA in the last 168 hours. Coagulation Profile: No results for input(s): INR, PROTIME in the last 168 hours. Cardiac Enzymes: No results for input(s): CKTOTAL, CKMB, CKMBINDEX, TROPONINI in the last 168 hours. BNP (last 3 results) No results for input(s): PROBNP in the last 8760 hours. HbA1C: No results for input(s): HGBA1C in the last 72 hours. CBG: No results for input(s): GLUCAP in the last 168 hours. Lipid Profile: No results for input(s): CHOL, HDL, LDLCALC, TRIG, CHOLHDL, LDLDIRECT in the last 72 hours. Thyroid Function Tests: No results for input(s): TSH, T4TOTAL, FREET4, T3FREE, THYROIDAB in the last 72 hours. Anemia Panel: No results for input(s): VITAMINB12, FOLATE, FERRITIN, TIBC, IRON, RETICCTPCT in the last 72 hours. Urine analysis:    Component Value Date/Time   COLORURINE BROWN (A) 10/01/2021 1842   APPEARANCEUR CLOUDY (A) 10/01/2021 1842   LABSPEC 1.025 10/01/2021 1842   PHURINE 6.5 10/01/2021 1842    GLUCOSEU NEGATIVE 10/01/2021 1842   HGBUR LARGE (A) 10/01/2021 1842   BILIRUBINUR MODERATE (A) 10/01/2021 1842   KETONESUR NEGATIVE 10/01/2021 1842   PROTEINUR >300 (A) 10/01/2021 1842   UROBILINOGEN 0.2 08/06/2011 0313   NITRITE POSITIVE (A) 10/01/2021 1842   LEUKOCYTESUR MODERATE (A) 10/01/2021 1842    Radiological Exams on Admission: DG Chest Port 1 View  Result Date: 11/15/2021 CLINICAL DATA:  Shortness of breath. EXAM: PORTABLE CHEST 1 VIEW COMPARISON:  06/30/2021. FINDINGS: The heart is mildly enlarged. There is atherosclerotic calcification of the aorta. The pulmonary vasculature is prominent. Lung volumes are low and there is airspace disease at the lung bases, greater on the left than on the right. No definite effusion or pneumothorax. No acute osseous abnormality. IMPRESSION: 1. Cardiomegaly with pulmonary vascular congestion. 2. Mild airspace disease at the lung bases, possible edema or infiltrate.  Electronically Signed   By: Brett Fairy M.D.   On: 11/15/2021 03:48    EKG: Independently reviewed. Sinus, no st elevations  Assessment/Plan Acute on chronic systolic HF Dyspnea     - placed in tele, obs     - CXR w/ pulm vascular congestion/edema; possible infiltrates     - BNP is 4152     - continue lasix     - check echo, daily wts, I&O     - w/ possible infiltrates, subjective fevers, and mildly elevated white count; lets check procal; hold abx for now     - will also check UA, Bld Cx  AKI on CKD3b     - check renal US     - he's getting lasix for HF; watch other nephrotoxins  CAD HLD     - continue home regimen when confirmed  BPH     - continue home regimen when confirmed  Microcytic anemia     - looks like he's iron deficient     - make sure he's not growing anything in his blood before replacing w/ IV Fe2+  DVT prophylaxis: SCDs Code Status: FULL  Family Communication: Spoke with son by phone. Consults called: None   Status is: Observation  The  patient remains OBS appropriate and will d/c before 2 midnights.  Jonnie Finner DO [facility]  If 7PM-7AM, please contact night-coverage [email or web address]  11/15/2021, 7:04 AM

## 2021-11-15 NOTE — Progress Notes (Signed)
bladder scan had >561 cc of urine. Foley placed immediate return of 600cc of urine. Pt calmed down and very appreciative.

## 2021-11-15 NOTE — Plan of Care (Signed)
Pt understands english for basic care, As we move into the heart failure my need interpreter

## 2021-11-15 NOTE — ED Provider Notes (Signed)
[facility] DEPT Provider Note   CSN: [id number] Arrival date & time: 11/15/21  1751     History Chief Complaint  Patient presents with   Shortness of Breath    Randy [name] is a 80 y.o. male.  The history is provided by the patient.  Shortness of Breath Severity:  Moderate Onset quality:  Gradual Duration:  2 days Timing:  Intermittent Progression:  Worsening Chronicity:  New Relieved by:  Nothing Worsened by:  Nothing Associated symptoms: cough and fever   Associated symptoms: no chest pain   Patient reports for the past 2 days has had increasing shortness of breath.  He reports subjective fevers.  No chest pain.  No lower extremity edema.    Past Medical History:  Diagnosis Date   Acute on chronic HFrEF (heart failure with reduced ejection fraction) ([place]) 07/01/2021   CAD, 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   Hearing loss    HTN (hypertension)    Hypoxia 07/01/2021    Patient Active Problem List   Diagnosis Date Noted   Bradycardia 07/05/2021   Hematuria 07/05/2021   Anemia 07/05/2021   Acute on chronic HFrEF (heart failure with reduced ejection fraction) ([place]) 07/01/2021   Hypoxia 07/01/2021   CAD, 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   CHF (congestive heart failure) ([place]) 07/01/2021   Stage 3b chronic kidney disease ([place])    Primary hypertension    Mixed hyperlipidemia    Pulmonary edema    AKI (acute kidney injury) ([place])    NSTEMI (non-ST elevated myocardial infarction) ([place])    Renal insufficiency 01/29/2021   Hypertensive crisis 01/29/2021   New onset of congestive heart failure ([place]) 01/29/2021   Acute respiratory failure with hypoxia ([place]) 01/29/2021    Past Surgical History:  Procedure Laterality Date   EYE SURGERY     IABP INSERTION N/A 01/30/2021   Procedure: IABP Insertion;  Surgeon: Leonie Man, MD;  Location: [facility];  Service: Cardiovascular;  Laterality: N/A;   RIGHT/LEFT HEART CATH AND CORONARY  ANGIOGRAPHY N/A 01/30/2021   Procedure: RIGHT/LEFT HEART CATH AND CORONARY ANGIOGRAPHY;  Surgeon: Leonie Man, MD;  Location: [facility];  Service: Cardiovascular;  Laterality: N/A;       History reviewed. No pertinent family history.  Social History   Tobacco Use   Smoking status: Former    Types: Cigarettes    Home Medications Prior to Admission medications   Medication Sig Start Date End Date Taking? Authorizing Provider  acetaminophen (TYLENOL) 325 MG tablet Take 2 tablets (650 mg total) by mouth every 4 (four) hours as needed for headache or mild pain. 07/04/21   Isaiah Serge, NP  aspirin 81 MG chewable tablet Chew 1 tablet (81 mg total) by mouth daily. 02/06/21   Hosie Poisson, MD  atorvastatin (LIPITOR) 80 MG tablet Take 1 tablet (80 mg total) by mouth daily. 02/06/21   Hosie Poisson, MD  carvedilol (COREG) 6.25 MG tablet Take 1 tablet (6.25 mg total) by mouth 2 (two) times daily with a meal. 02/05/21   Hosie Poisson, MD  clopidogrel (PLAVIX) 75 MG tablet Take 1 tablet (75 mg total) by mouth daily. 02/06/21   Hosie Poisson, MD  furosemide (LASIX) 80 MG tablet Take 1 tablet (80 mg total) by mouth daily. 07/05/21   Isaiah Serge, NP  isosorbide-hydrALAZINE (BIDIL) 20-37.5 MG tablet Take 1 tablet by mouth 3 (three) times daily. 07/04/21   Isaiah Serge, NP  nitroGLYCERIN (NITROSTAT) 0.4 MG SL tablet Place  1 tablet (0.4 mg total) under the tongue every 5 (five) minutes x 3 doses as needed for chest pain. 02/05/21   Hosie Poisson, MD  pantoprazole (PROTONIX) 40 MG tablet Take 1 tablet (40 mg total) by mouth daily at 6 (six) AM. 02/06/21   Hosie Poisson, MD  tamsulosin (FLOMAX) 0.4 MG CAPS capsule Take 1 capsule (0.4 mg total) by mouth 2 (two) times daily. 07/05/21   Hilty, Nadean Corwin, MD    Allergies    Patient has no known allergies.  Review of Systems   Review of Systems  Constitutional:  Positive for fever.  Respiratory:  Positive for cough and shortness of breath.    Cardiovascular:  Negative for chest pain and leg swelling.  Gastrointestinal:        He reports abdominal discomfort  All other systems reviewed and are negative.  Physical Exam Updated Vital Signs BP (!) 127/98 (BP Location: Right Arm)   Pulse 74   Temp 98.1 F (36.7 C) (Oral)   Resp 20   Ht 1.448 m (4\' 9" )   Wt 61.2 kg   SpO2 100%   BMI 29.21 kg/m   Physical Exam CONSTITUTIONAL: Elderly, chronically ill-appearing HEAD: Normocephalic/atraumatic EYES: EOMI ENMT: Mucous membranes moist, poor dentition NECK: supple no meningeal signs SPINE/BACK:entire spine nontender CV: S1/S2 noted, no murmurs/rubs/gallops noted LUNGS: Basilar crackles noted, no acute distress ABDOMEN: soft, nontender NEURO: Pt is awake/alert/appropriate, moves all extremitiesx4.  No facial droop.   EXTREMITIES: pulses normal/equal, full ROM, no lower extremity edema SKIN: warm, color normal PSYCH: no abnormalities of mood noted, alert and oriented to situation  ED Results / Procedures / Treatments   Labs (all labs ordered are listed, but only abnormal results are displayed) Labs Reviewed  BASIC METABOLIC PANEL - Abnormal; Notable for the following components:      Result Value   Glucose, Bld 135 (*)    BUN 52 (*)    Creatinine, Ser 2.32 (*)    Calcium 8.7 (*)    GFR, Estimated 27 (*)    All other components within normal limits  CBC WITH DIFFERENTIAL/PLATELET - Abnormal; Notable for the following components:   WBC 11.6 (*)    Hemoglobin 8.9 (*)    HCT 30.2 (*)    MCV 71.2 (*)    MCH 21.0 (*)    MCHC 29.5 (*)    RDW 17.2 (*)    Platelets 675 (*)    Neutro Abs 8.8 (*)    Monocytes Absolute 1.2 (*)    Abs Immature Granulocytes 0.10 (*)    All other components within normal limits  BRAIN NATRIURETIC PEPTIDE - Abnormal; Notable for the following components:   B Natriuretic Peptide 4,152.3 (*)    All other components within normal limits  RESP PANEL BY RT-PCR (FLU A&B, COVID) ARPGX2  VITAMIN  B12  FOLATE  IRON AND TIBC  FERRITIN  RETICULOCYTES  POC OCCULT BLOOD, ED  TYPE AND SCREEN    EKG EKG Interpretation  Date/Time:  Friday November 15 2021 03:57:49 EST Ventricular Rate:  71 PR Interval:  208 QRS Duration: 109 QT Interval:  421 QTC Calculation: 458 R Axis:   110 Text Interpretation: Sinus rhythm Atrial premature complex Probable lateral infarct, age indeterminate Confirmed by Ripley Fraise ([id number]) on 11/15/2021 4:05:04 AM  Radiology DG Chest Port 1 View  Result Date: 11/15/2021 CLINICAL DATA:  Shortness of breath. EXAM: PORTABLE CHEST 1 VIEW COMPARISON:  06/30/2021. FINDINGS: The heart is mildly enlarged. There is atherosclerotic calcification  of the aorta. The pulmonary vasculature is prominent. Lung volumes are low and there is airspace disease at the lung bases, greater on the left than on the right. No definite effusion or pneumothorax. No acute osseous abnormality. IMPRESSION: 1. Cardiomegaly with pulmonary vascular congestion. 2. Mild airspace disease at the lung bases, possible edema or infiltrate. Electronically Signed   By: Brett Fairy M.D.   On: 11/15/2021 03:48    Procedures Procedures   Medications Ordered in ED Medications  furosemide (LASIX) injection 40 mg (40 mg Intravenous Given 11/15/21 0523)    ED Course  I have reviewed the triage vital signs and the nursing notes.  Pertinent labs & imaging results that were available during my care of the patient were reviewed by me and considered in my medical decision making (see chart for details).    MDM Rules/Calculators/A&P                           Patient with known history of ischemic cardiomyopathy.  Patient is listed as speaking [place], however he tells me its another language that he speaks.  He is able to speak [place] and answer most questions appropriately.  Apparently his native language is not currently available on the language line Strong suspicion this represents decompensated  CHF.  Per records, patient is not a candidate for CABG or PCI It is unclear if patient is compliant with his medication. I personally reviewed his chest x-ray and reveals pulmonary edema Labs pending at this time 5:02 AM Labs reveal acute on chronic renal failure. Also noted to have worsening anemia.  Patient is not aware of any blood loss. Rectal exam was performed with nurse present, no obvious blood or melena Will give dose of Lasix and admit to the hospital 5:53 AM Discussed with hospitalist Dr. Hal Hope for admission. Final Clinical Impression(s) / ED Diagnoses Final diagnoses:  Acute on chronic systolic congestive heart failure (HCC)  AKI (acute kidney injury) ([place])  Acute anemia    Rx / DC Orders ED Discharge Orders     None        Ripley Fraise, MD 11/15/21 [phone]

## 2021-11-16 ENCOUNTER — Observation Stay (HOSPITAL_BASED_OUTPATIENT_CLINIC_OR_DEPARTMENT_OTHER): Payer: Medicare Other

## 2021-11-16 DIAGNOSIS — N390 Urinary tract infection, site not specified: Secondary | ICD-10-CM | POA: Diagnosis present

## 2021-11-16 DIAGNOSIS — I5023 Acute on chronic systolic (congestive) heart failure: Secondary | ICD-10-CM | POA: Diagnosis present

## 2021-11-16 DIAGNOSIS — R0602 Shortness of breath: Secondary | ICD-10-CM | POA: Diagnosis present

## 2021-11-16 DIAGNOSIS — N1832 Chronic kidney disease, stage 3b: Secondary | ICD-10-CM | POA: Diagnosis present

## 2021-11-16 DIAGNOSIS — Z20822 Contact with and (suspected) exposure to covid-19: Secondary | ICD-10-CM | POA: Diagnosis present

## 2021-11-16 DIAGNOSIS — H9190 Unspecified hearing loss, unspecified ear: Secondary | ICD-10-CM | POA: Diagnosis present

## 2021-11-16 DIAGNOSIS — I5021 Acute systolic (congestive) heart failure: Secondary | ICD-10-CM | POA: Diagnosis not present

## 2021-11-16 DIAGNOSIS — D75839 Thrombocytosis, unspecified: Secondary | ICD-10-CM | POA: Diagnosis present

## 2021-11-16 DIAGNOSIS — R338 Other retention of urine: Secondary | ICD-10-CM | POA: Diagnosis present

## 2021-11-16 DIAGNOSIS — N179 Acute kidney failure, unspecified: Secondary | ICD-10-CM | POA: Diagnosis not present

## 2021-11-16 DIAGNOSIS — Z87891 Personal history of nicotine dependence: Secondary | ICD-10-CM | POA: Diagnosis not present

## 2021-11-16 DIAGNOSIS — I509 Heart failure, unspecified: Secondary | ICD-10-CM | POA: Diagnosis not present

## 2021-11-16 DIAGNOSIS — B952 Enterococcus as the cause of diseases classified elsewhere: Secondary | ICD-10-CM | POA: Diagnosis present

## 2021-11-16 DIAGNOSIS — I2510 Atherosclerotic heart disease of native coronary artery without angina pectoris: Secondary | ICD-10-CM | POA: Diagnosis present

## 2021-11-16 DIAGNOSIS — Z7982 Long term (current) use of aspirin: Secondary | ICD-10-CM | POA: Diagnosis not present

## 2021-11-16 DIAGNOSIS — E785 Hyperlipidemia, unspecified: Secondary | ICD-10-CM | POA: Diagnosis present

## 2021-11-16 DIAGNOSIS — N401 Enlarged prostate with lower urinary tract symptoms: Secondary | ICD-10-CM

## 2021-11-16 DIAGNOSIS — B961 Klebsiella pneumoniae [K. pneumoniae] as the cause of diseases classified elsewhere: Secondary | ICD-10-CM | POA: Diagnosis present

## 2021-11-16 DIAGNOSIS — D509 Iron deficiency anemia, unspecified: Secondary | ICD-10-CM | POA: Diagnosis present

## 2021-11-16 DIAGNOSIS — Z79899 Other long term (current) drug therapy: Secondary | ICD-10-CM | POA: Diagnosis not present

## 2021-11-16 DIAGNOSIS — Z7902 Long term (current) use of antithrombotics/antiplatelets: Secondary | ICD-10-CM | POA: Diagnosis not present

## 2021-11-16 DIAGNOSIS — I130 Hypertensive heart and chronic kidney disease with heart failure and stage 1 through stage 4 chronic kidney disease, or unspecified chronic kidney disease: Secondary | ICD-10-CM | POA: Diagnosis present

## 2021-11-16 HISTORY — DX: Acute systolic (congestive) heart failure: I50.21

## 2021-11-16 LAB — CBC
HCT: 30.3 % — ABNORMAL LOW (ref 39.0–52.0)
Hemoglobin: 8.9 g/dL — ABNORMAL LOW (ref 13.0–17.0)
MCH: 20.9 pg — ABNORMAL LOW (ref 26.0–34.0)
MCHC: 29.4 g/dL — ABNORMAL LOW (ref 30.0–36.0)
MCV: 71.1 fL — ABNORMAL LOW (ref 80.0–100.0)
Platelets: 740 10*3/uL — ABNORMAL HIGH (ref 150–400)
RBC: 4.26 MIL/uL (ref 4.22–5.81)
RDW: 17.3 % — ABNORMAL HIGH (ref 11.5–15.5)
WBC: 15.4 10*3/uL — ABNORMAL HIGH (ref 4.0–10.5)
nRBC: 0.1 % (ref 0.0–0.2)

## 2021-11-16 LAB — ECHOCARDIOGRAM COMPLETE
AR max vel: 1.49 cm2
AV Area VTI: 1.45 cm2
AV Area mean vel: 1.53 cm2
AV Mean grad: 5 mmHg
AV Peak grad: 9.1 mmHg
Ao pk vel: 1.51 m/s
Area-P 1/2: 5.7 cm2
Calc EF: 38.1 %
Height: 57 in
MV M vel: 5.1 m/s
MV Peak grad: 104 mmHg
Radius: 0.5 cm
S' Lateral: 4.4 cm
Single Plane A2C EF: 39.3 %
Single Plane A4C EF: 38.4 %
Weight: 2160 oz

## 2021-11-16 LAB — COMPREHENSIVE METABOLIC PANEL
ALT: 33 U/L (ref 0–44)
AST: 27 U/L (ref 15–41)
Albumin: 3.4 g/dL — ABNORMAL LOW (ref 3.5–5.0)
Alkaline Phosphatase: 106 U/L (ref 38–126)
Anion gap: 13 (ref 5–15)
BUN: 50 mg/dL — ABNORMAL HIGH (ref 8–23)
CO2: 24 mmol/L (ref 22–32)
Calcium: 9 mg/dL (ref 8.9–10.3)
Chloride: 99 mmol/L (ref 98–111)
Creatinine, Ser: 2.28 mg/dL — ABNORMAL HIGH (ref 0.61–1.24)
GFR, Estimated: 28 mL/min — ABNORMAL LOW (ref >60)
Glucose, Bld: 124 mg/dL — ABNORMAL HIGH (ref 70–99)
Potassium: 4.1 mmol/L (ref 3.5–5.1)
Sodium: 136 mmol/L (ref 135–145)
Total Bilirubin: 0.6 mg/dL (ref 0.3–1.2)
Total Protein: 7.1 g/dL (ref 6.5–8.1)

## 2021-11-16 MED ORDER — TAMSULOSIN HCL 0.4 MG PO CAPS
0.4000 mg | ORAL_CAPSULE | Freq: Two times a day (BID) | ORAL | Status: DC
  Administered 2021-11-16 – 2021-11-20 (×9): 0.4 mg via ORAL
  Filled 2021-11-16 (×9): qty 1

## 2021-11-16 MED ORDER — CHLORHEXIDINE GLUCONATE CLOTH 2 % EX PADS
6.0000 | MEDICATED_PAD | Freq: Every day | CUTANEOUS | Status: DC
  Administered 2021-11-16: 10:00:00 6 via TOPICAL

## 2021-11-16 MED ORDER — TRAMADOL HCL 50 MG PO TABS
50.0000 mg | ORAL_TABLET | Freq: Once | ORAL | Status: AC
  Administered 2021-11-17: 20:00:00 50 mg via ORAL
  Filled 2021-11-16: qty 1

## 2021-11-16 MED ORDER — AMOXICILLIN 250 MG PO CAPS
250.0000 mg | ORAL_CAPSULE | Freq: Two times a day (BID) | ORAL | Status: DC
  Administered 2021-11-16 – 2021-11-20 (×8): 250 mg via ORAL
  Filled 2021-11-16 (×8): qty 1

## 2021-11-16 MED ORDER — FINASTERIDE 5 MG PO TABS
5.0000 mg | ORAL_TABLET | Freq: Every day | ORAL | Status: DC
  Administered 2021-11-16 – 2021-11-20 (×5): 5 mg via ORAL
  Filled 2021-11-16 (×5): qty 1

## 2021-11-16 NOTE — Assessment & Plan Note (Addendum)
Urine from 10/15 grew Klebsiella pneumonia and E faecalis and is growing the same now continue Keflex & amoxil WBC>11.6 > 15.4 > 13.0

## 2021-11-16 NOTE — Progress Notes (Signed)
[facility] Progress Note  Patient: Randy [name]    ZOX:[id number]  DOA: 11/15/2021    Date of Service: the patient was seen and examined on 11/16/2021  Brief hospital course: This is an 80 year old male with a medical history of chronic systolic heart failure, coronary artery disease and hypertension who presents with shortness of breath and difficulty urinating.  He describes orthopnea dyspnea on exertion and possibly some fevers although, he did not check his temperature. In the ED he was found to have pulmonary edema on exam and prior chest x-ray.  He was also found to have urinary retention and a Foley catheter was placed.  The UA was consistent with a UTI.  Also noted was a creatinine that was elevated at 2.32 compared to a baseline creatinine of 1.6.  The patient was started on antibiotics and Lasix.  Assessment and Plan: Acute on chronic HFrEF (heart failure with reduced ejection fraction) (HCC) - Pulmonary edema noted on exam and on chest x-ray -The patient's respiratory rate is going up into the 30s while at rest and he also complains of orthopnea - Pulse ox has been ranging 96 to 98% on room air - Continue to diurese with IV Lasix- he is supposed to be taking 80 mg of Lasix daily at home - Follow I's and O's and daily weights  AKI-CKD 3B - Baseline creatinine is around 1.6-1.7 - Suspect AKI is related to poor cardiac output - Creatinine has improved slightly to 2.28 today-continue to follow  Urinary retention due to benign prostatic hyperplasia - Continue Foley catheter - He takes Flomax at home which is being continued - We will add Proscar  Urinary tract infection - Urine from 10/15 grew Klebsiella pneumonia and E faecalis and is growing the same now - At this time he is receiving ceftriaxone- will add amoxil - BC count was 11.6 when admitted and has risen slightly to 15.4 today   Microcytic anemia - Hemoglobin was 8.9, MCV 71, MCH 21 - As long as blood  cultures remain negative, will give him an iron infusion prior to discharging home   Body mass index is 29.21 kg/m.        Subjective: He has no complaints today  Objective: Vitals with BMI 11/16/2021 11/16/2021 11/16/2021  Height - - -  Weight - - -  BMI - - -  Systolic 811 914 782  Diastolic 54 64 71  Pulse 73 76 71     Exam: General exam: Appears comfortable  HEENT: PERRLA, oral mucosa moist, no sclera icterus or thrush Respiratory system: Clear to auscultation. Respiratory effort normal. Cardiovascular system: S1 & S2 heard, regular rate and rhythm Gastrointestinal system: Abdomen soft, non-tender, nondistended. Normal bowel sounds   Central nervous system: Alert and oriented. No focal neurological deficits. Extremities: No cyanosis, clubbing or edema Skin: No rashes or ulcers Psychiatry:  Mood & affect appropriate.     Data Reviewed: CBC    Component Value Date/Time   WBC 15.4 (H) 11/16/2021 0447   RBC 4.26 11/16/2021 0447   HGB 8.9 (L) 11/16/2021 0447   HCT 30.3 (L) 11/16/2021 0447   PLT 740 (H) 11/16/2021 0447   MCV 71.1 (L) 11/16/2021 0447   MCH 20.9 (L) 11/16/2021 0447   MCHC 29.4 (L) 11/16/2021 0447   RDW 17.3 (H) 11/16/2021 0447   LYMPHSABS 1.2 11/15/2021 0349   MONOABS 1.2 (H) 11/15/2021 0349   EOSABS 0.2 11/15/2021 0349   BASOSABS 0.0 11/15/2021 0349    BMET  Component Value Date/Time   NA 136 11/16/2021 0447   K 4.1 11/16/2021 0447   CL 99 11/16/2021 0447   CO2 24 11/16/2021 0447   GLUCOSE 124 (H) 11/16/2021 0447   BUN 50 (H) 11/16/2021 0447   CREATININE 2.28 (H) 11/16/2021 0447   CALCIUM 9.0 11/16/2021 0447   GFRNONAA 28 (L) 11/16/2021 0447     Disposition:  Status is: Observation  The patient will require care spanning > 2 midnights and should be moved to inpatient because: requiring IV Lasix and antibiotics       Family Communication:    DVT Prophylaxis: SCDs Start: 11/15/21 1340   Time spent: 35 minutes.    Author: Debbe Odea  11/16/2021 4:27 PM  To reach On-call, see care teams to locate the attending and reach out via [email or web address]. Between 7PM-7AM, please contact night-coverage If you still have difficulty reaching the attending provider, please page the [facility] (Director on Call) for [facility] on amion for assistance.

## 2021-11-16 NOTE — Assessment & Plan Note (Addendum)
Baseline creatinine is around 1.6-1.7 Suspect AKI is related to poor cardiac output and overdiuresis. Creatinine has improved slightly, continue home regimen.

## 2021-11-16 NOTE — Assessment & Plan Note (Addendum)
Patient had a Foley catheter for retention. Foley catheter removed and patient was able to void before discharge. Continue Flomax. Continue Proscar.

## 2021-11-16 NOTE — Assessment & Plan Note (Addendum)
-   Hemoglobin was 8.9, MCV 71, MCH 21 - Continue oral iron supplementation for now.

## 2021-11-16 NOTE — Assessment & Plan Note (Addendum)
Presents with shortness of breath. Chest x-ray shows vascular congestion. Echocardiogram shows EF of 45%.  Moderate to severe MR. Currently saturating on room air. Treated with IV diuresis. Suspect renal function mildly elevated secondary to overdiuresis therefore will resume home Lasix on discharge.

## 2021-11-16 NOTE — Hospital Course (Addendum)
This is an 80 year old male with a medical history of chronic systolic heart failure, coronary artery disease and hypertension who presents with shortness of breath and difficulty urinating.  He describes orthopnea dyspnea on exertion and possibly some fevers although, he did not check his temperature. In the ED he was found to have pulmonary edema on exam and prior chest x-ray.  He was also found to have urinary retention and a Foley catheter was placed.  The UA was consistent with a UTI.  Also noted was a creatinine that was elevated at 2.32 compared to a baseline creatinine of 1.6.  The patient was started on antibiotics and Lasix.

## 2021-11-17 DIAGNOSIS — D509 Iron deficiency anemia, unspecified: Secondary | ICD-10-CM

## 2021-11-17 LAB — CBC
HCT: 33.5 % — ABNORMAL LOW (ref 39.0–52.0)
Hemoglobin: 9.7 g/dL — ABNORMAL LOW (ref 13.0–17.0)
MCH: 20.7 pg — ABNORMAL LOW (ref 26.0–34.0)
MCHC: 29 g/dL — ABNORMAL LOW (ref 30.0–36.0)
MCV: 71.6 fL — ABNORMAL LOW (ref 80.0–100.0)
Platelets: 705 10*3/uL — ABNORMAL HIGH (ref 150–400)
RBC: 4.68 MIL/uL (ref 4.22–5.81)
RDW: 17.3 % — ABNORMAL HIGH (ref 11.5–15.5)
WBC: 13 10*3/uL — ABNORMAL HIGH (ref 4.0–10.5)
nRBC: 0 % (ref 0.0–0.2)

## 2021-11-17 LAB — BASIC METABOLIC PANEL
Anion gap: 11 (ref 5–15)
BUN: 47 mg/dL — ABNORMAL HIGH (ref 8–23)
CO2: 29 mmol/L (ref 22–32)
Calcium: 8.8 mg/dL — ABNORMAL LOW (ref 8.9–10.3)
Chloride: 95 mmol/L — ABNORMAL LOW (ref 98–111)
Creatinine, Ser: 2.17 mg/dL — ABNORMAL HIGH (ref 0.61–1.24)
GFR, Estimated: 30 mL/min — ABNORMAL LOW (ref >60)
Glucose, Bld: 218 mg/dL — ABNORMAL HIGH (ref 70–99)
Potassium: 3.9 mmol/L (ref 3.5–5.1)
Sodium: 135 mmol/L (ref 135–145)

## 2021-11-17 MED ORDER — SODIUM CHLORIDE 0.9 % IV SOLN
510.0000 mg | Freq: Once | INTRAVENOUS | Status: AC
  Administered 2021-11-17: 20:00:00 510 mg via INTRAVENOUS
  Filled 2021-11-17: qty 17

## 2021-11-17 NOTE — Progress Notes (Signed)
[facility] Progress Note  Patient: Randy [name]    WUJ:[id number]  DOA: 11/15/2021    Date of Service: the patient was seen and examined on 11/17/2021  Brief hospital course: This is an 80 year old male with a medical history of chronic systolic heart failure, coronary artery disease and hypertension who presents with shortness of breath and difficulty urinating.  He describes orthopnea dyspnea on exertion and possibly some fevers although, he did not check his temperature. In the ED he was found to have pulmonary edema on exam and prior chest x-ray.  He was also found to have urinary retention and a Foley catheter was placed.  The UA was consistent with a UTI.  Also noted was a creatinine that was elevated at 2.32 compared to a baseline creatinine of 1.6.  The patient was started on antibiotics and Lasix.  Assessment and Plan: Acute on chronic HFrEF (heart failure with reduced ejection fraction) (HCC) - Pulmonary edema noted on exam and on chest x-ray -The patient's respiratory rate is going up into the 30s while at rest and he also complains of orthopnea - Pulse ox has been ranging 96 to 98% on room air - Continue to diurese with IV Lasix- he is supposed to be taking 80 mg of Lasix daily at home - Follow I's and O's and daily weights - check ambulatory pulse ox   AKI-CKD 3B - Baseline creatinine is around 1.6-1.7 - Suspect AKI is related to poor cardiac output - Creatinine has improved slightly to 2.17 today-continue to follow  Urinary retention due to benign prostatic hyperplasia - Placed Foley catheter - He takes Flomax at home which is being continued - added Proscar - will give voiding trial today  Urinary tract infection - Urine from 10/15 grew Klebsiella pneumonia and E faecalis and is growing the same now - At this time he is receiving ceftriaxone- will add amoxil - WBC count 11.6> 15.4> 13  Microcytic anemia - Hemoglobin was 8.9, MCV 71, MCH 21 -  will give  Iron infusion today   Body mass index is 29.21 kg/m.        Subjective:  No new complaints.   Objective: Vitals with BMI 11/17/2021 11/17/2021 11/16/2021  Height - - -  Weight - - -  BMI - - -  Systolic 956 213 086  Diastolic 59 68 64  Pulse 78 80 76     Exam: General exam: Appears comfortable  HEENT: PERRLA, oral mucosa moist, no sclera icterus or thrush Respiratory system: Clear to auscultation. Respiratory effort normal. Cardiovascular system: S1 & S2 heard, regular rate and rhythm Gastrointestinal system: Abdomen soft, non-tender, nondistended. Normal bowel sounds   Central nervous system: Alert and oriented. No focal neurological deficits. Extremities: No cyanosis, clubbing or edema Skin: No rashes or ulcers Psychiatry:  Mood & affect appropriate.      Data Reviewed: CBC    Component Value Date/Time   WBC 13.0 (H) 11/17/2021 0919   RBC 4.68 11/17/2021 0919   HGB 9.7 (L) 11/17/2021 0919   HCT 33.5 (L) 11/17/2021 0919   PLT 705 (H) 11/17/2021 0919   MCV 71.6 (L) 11/17/2021 0919   MCH 20.7 (L) 11/17/2021 0919   MCHC 29.0 (L) 11/17/2021 0919   RDW 17.3 (H) 11/17/2021 0919   LYMPHSABS 1.2 11/15/2021 0349   MONOABS 1.2 (H) 11/15/2021 0349   EOSABS 0.2 11/15/2021 0349   BASOSABS 0.0 11/15/2021 0349    BMET    Component Value Date/Time   NA 135  11/17/2021 0919   K 3.9 11/17/2021 0919   CL 95 (L) 11/17/2021 0919   CO2 29 11/17/2021 0919   GLUCOSE 218 (H) 11/17/2021 0919   BUN 47 (H) 11/17/2021 0919   CREATININE 2.17 (H) 11/17/2021 0919   CALCIUM 8.8 (L) 11/17/2021 0919   GFRNONAA 30 (L) 11/17/2021 0919     Disposition:  Status is: Observation  The patient will require care spanning > 2 midnights and should be moved to inpatient because: requiring IV Lasix and antibiotics       Family Communication:    DVT Prophylaxis: SCDs Start: 11/15/21 1340   Time spent: 35 minutes.   Author: Debbe Odea  11/17/2021 4:29 PM  To reach On-call, see  care teams to locate the attending and reach out via [email or web address]. Between 7PM-7AM, please contact night-coverage If you still have difficulty reaching the attending provider, please page the [facility] (Director on Call) for [facility] on amion for assistance.
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
## 2021-11-17 NOTE — Progress Notes (Incomplete)
SAT 89-97% on RA with ambulation. At Rest sat 99-100

## 2021-11-18 LAB — BASIC METABOLIC PANEL
Anion gap: 11 (ref 5–15)
BUN: 60 mg/dL — ABNORMAL HIGH (ref 8–23)
CO2: 29 mmol/L (ref 22–32)
Calcium: 8.9 mg/dL (ref 8.9–10.3)
Chloride: 95 mmol/L — ABNORMAL LOW (ref 98–111)
Creatinine, Ser: 2.24 mg/dL — ABNORMAL HIGH (ref 0.61–1.24)
GFR, Estimated: 29 mL/min — ABNORMAL LOW (ref >60)
Glucose, Bld: 147 mg/dL — ABNORMAL HIGH (ref 70–99)
Potassium: 4 mmol/L (ref 3.5–5.1)
Sodium: 135 mmol/L (ref 135–145)

## 2021-11-18 LAB — CBC
HCT: 31.8 % — ABNORMAL LOW (ref 39.0–52.0)
Hemoglobin: 9.6 g/dL — ABNORMAL LOW (ref 13.0–17.0)
MCH: 21.1 pg — ABNORMAL LOW (ref 26.0–34.0)
MCHC: 30.2 g/dL (ref 30.0–36.0)
MCV: 70 fL — ABNORMAL LOW (ref 80.0–100.0)
Platelets: 684 10*3/uL — ABNORMAL HIGH (ref 150–400)
RBC: 4.54 MIL/uL (ref 4.22–5.81)
RDW: 17.4 % — ABNORMAL HIGH (ref 11.5–15.5)
WBC: 11.5 10*3/uL — ABNORMAL HIGH (ref 4.0–10.5)
nRBC: 0 % (ref 0.0–0.2)

## 2021-11-18 LAB — URINE CULTURE: Culture: >=100000 — AB

## 2021-11-18 MED ORDER — CEPHALEXIN 500 MG PO CAPS
500.0000 mg | ORAL_CAPSULE | Freq: Three times a day (TID) | ORAL | Status: DC
  Administered 2021-11-18 – 2021-11-20 (×7): 500 mg via ORAL
  Filled 2021-11-18 (×7): qty 1

## 2021-11-18 MED ORDER — CARVEDILOL 3.125 MG PO TABS
3.1250 mg | ORAL_TABLET | Freq: Two times a day (BID) | ORAL | Status: DC
  Administered 2021-11-18 – 2021-11-20 (×4): 3.125 mg via ORAL
  Filled 2021-11-18 (×4): qty 1

## 2021-11-18 NOTE — Progress Notes (Addendum)
[facility] Progress Note  Patient: Randy [name]    JKD:[id number]  DOA: 11/15/2021    Date of Service: the patient was seen and examined on 11/18/2021  Brief hospital course: This is an 80 year old male with a medical history of chronic systolic heart failure, coronary artery disease and hypertension who presents with shortness of breath and difficulty urinating.  He describes orthopnea dyspnea on exertion and possibly some fevers although, he did not check his temperature. In the ED he was found to have pulmonary edema on exam and prior chest x-ray.  He was also found to have urinary retention and a Foley catheter was placed.  The UA was consistent with a UTI.  Also noted was a creatinine that was elevated at 2.32 compared to a baseline creatinine of 1.6.  The patient was started on antibiotics and Lasix.   Subjective:  He has no new complaints.   Assessment and Plan: Acute on chronic HFrEF (heart failure with reduced ejection fraction) (HCC) - Pulmonary edema noted on exam and on chest x-ray -The patient's respiratory rate is going up into the 30s while at rest and he also complains of orthopnea - Pulse ox has been ranging 96 to 98% on room air - he is supposed to be taking 80 mg of Lasix daily at home - Follow I's and O's and daily weights - symptoms resolved- Will  dc IV Lasix-  - 2 D ECHO repeated and EF found to be 25%, Left ventricular diastolic parameters are indeterminate. He has moderate to severe MR - start Coreg - avoid ACE/ARB due to creatinine level  AKI-CKD 3B - Baseline creatinine is around 1.6-1.7 - presented with Cr of ~ 2.2 - Creatinine has, unfortunately not improved despite foley and despite diuresis- ? If this is his new baseline  Urinary retention due to benign prostatic hyperplasia - Placed Foley catheter - He takes Flomax at home which is being continued - added Proscar - voiding trial given on 11/27- able to void without issues  Urinary tract  infection - Urine from 10/15 grew Klebsiella pneumonia and E faecalis and is growing the same now -  cont Keflex and Amoxil for a total of 7 days - WBC count 11.6> 15.4> 13> 11.5  Microcytic anemia - Hemoglobin was 8.9, MCV 71, MCH 21 -   given Iron infusion 11/27 - cont oral Iron   Body mass index is 29.21 kg/m.         Objective: Vitals with BMI 11/18/2021 11/17/2021 11/17/2021  Height - - -  Weight - - -  BMI - - -  Systolic 099 833 825  Diastolic 67 63 59  Pulse 74 81 78     Exam: General exam: Appears comfortable  HEENT: PERRLA, oral mucosa moist, no sclera icterus or thrush Respiratory system: Clear to auscultation. Respiratory effort normal. Cardiovascular system: S1 & S2 heard, regular rate and rhythm Gastrointestinal system: Abdomen soft, non-tender, nondistended. Normal bowel sounds   Central nervous system: Alert and oriented. No focal neurological deficits. Extremities: No cyanosis, clubbing or edema Skin: No rashes or ulcers Psychiatry:  Mood & affect appropriate.      Data Reviewed: CBC    Component Value Date/Time   WBC 11.5 (H) 11/18/2021 0442   RBC 4.54 11/18/2021 0442   HGB 9.6 (L) 11/18/2021 0442   HCT 31.8 (L) 11/18/2021 0442   PLT 684 (H) 11/18/2021 0442   MCV 70.0 (L) 11/18/2021 0442   MCH 21.1 (L) 11/18/2021 0539  MCHC 30.2 11/18/2021 0442   RDW 17.4 (H) 11/18/2021 0442   LYMPHSABS 1.2 11/15/2021 0349   MONOABS 1.2 (H) 11/15/2021 0349   EOSABS 0.2 11/15/2021 0349   BASOSABS 0.0 11/15/2021 0349    BMET    Component Value Date/Time   NA 135 11/18/2021 0442   K 4.0 11/18/2021 0442   CL 95 (L) 11/18/2021 0442   CO2 29 11/18/2021 0442   GLUCOSE 147 (H) 11/18/2021 0442   BUN 60 (H) 11/18/2021 0442   CREATININE 2.24 (H) 11/18/2021 0442   CALCIUM 8.9 11/18/2021 0442   GFRNONAA 29 (L) 11/18/2021 0442     Disposition:  Status is: Inpatient- He is homeless and has been living [facility] a [facility]- TOC is giving him information regarding  housing       Family Communication:    DVT Prophylaxis: SCDs Start: 11/15/21 1340   Time spent: 35 minutes.   Author: Debbe Odea  11/18/2021 3:09 PM  To reach On-call, see care teams to locate the attending and reach out via [email or web address]. Between 7PM-7AM, please contact night-coverage If you still have difficulty reaching the attending provider, please page the [facility] (Director on Call) for [facility] on amion for assistance.

## 2021-11-18 NOTE — TOC Progression Note (Signed)
Transition of Care [facility]) - Progression Note    Patient Details  Name: Randy [name] MRN: [id number] Date of Birth: 26-Mar-1941  Transition [facility] Care [facility]) CM/SW Contact  Leeroy Cha, RN Phone Number: 11/18/2021, 12:19 PM  Clinical Narrative:    Spoke with the help of the interrupter to the patient information concerning low cost housing and emergency shelters given to patient.  Does not have a home to go to due to a family situation.  [place] gave him information on transportation in [employer]  Patient speaks some english and read [place].   Expected Discharge Plan: [place] Barriers to Discharge: Continued Medical Work up  Expected Discharge Plan and Services Expected Discharge Plan: [place]   Discharge Planning Services: CM Consult Post Acute Care Choice: [place] arrangements for the past 2 months: Single Family Home                           HH Arranged: Disease Management [place] Agency: [place] Date [place]: 11/18/21 Time [place]: 0807 Representative spoke with at [place]: [place] ([place]) Interventions    Readmission Risk Interventions No flowsheet data found.
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
## 2021-11-18 NOTE — Progress Notes (Signed)
Nutrition Education Note  RD consulted for nutrition education for low sodium diet education and education on a 1.2 L fluid restriction.  Patient speaks limited [place] and his primary language listed is [place]. Documentation in the chart indicates patient does need an interpreter.   iPad not in patient's room but was able to be located down the hall. Shortly after RD entered patient's room with the iPad and received interpreter's name (Quasi) and ID number ([id number]) patient got out of bed and walked over to his personal belongings with back to RD. He did not engage or acknowledge RD after this point.   RD waited at bedside for 7 minutes prior to ending the call with interpreter.   RD provided "Low Sodium Nutrition Therapy" and "Fluid Restricted Nutrition Therapy" handouts from the Academy of Nutrition and Dietetics. These were left on the left side of the sink by the door in patient's room.   Compliance unable to be assessed.   Body mass index is 29.21 kg/m. Pt meets criteria for overweight status based on current BMI.  Current diet order is Heart Healthy, 1.2 L fluid restriction. Labs and medications reviewed. No further nutrition interventions warranted at this time. RD contact information provided. If additional nutrition issues arise, please re-consult RD.        Jarome Matin, MS, RD, LDN, CNSC Inpatient Clinical Dietitian RD pager # available in [place]  After hours/weekend pager # available in [facility]

## 2021-11-18 NOTE — TOC Initial Note (Addendum)
Transition of Care [facility]) - Initial/Assessment Note    Patient Details  Name: Randy [name] MRN: [id number] Date of Birth: 1941/06/06  Transition of Care [facility]) CM/SW Contact:    Leeroy Cha, RN Phone Number: 11/18/2021, 8:02 AM  Clinical Narrative:                 80 year old male with a medical history of chronic systolic heart failure, coronary artery disease and hypertension who presents with shortness of breath and difficulty urinating.  He describes orthopnea dyspnea on exertion and possibly some fevers although, he did not check his temperature. In the ED he was found to have pulmonary edema on exam and prior chest x-ray.  He was also found to have urinary retention and a Foley catheter was placed.  The UA was consistent with a UTI.  Also noted was a creatinine that was elevated at 2.32 compared to a baseline creatinine of 1.6.  The patient was started on antibiotics and Lasix.   Assessment and Plan: Acute on chronic HFrEF (heart failure with reduced ejection fraction) (HCC) - Pulmonary edema noted on exam and on chest x-ray -The patient's respiratory rate is going up into the 30s while at rest and he also complains of orthopnea - Pulse ox has been ranging 96 to 98% on room air - Continue to diurese with IV Lasix- he is supposed to be taking 80 mg of Lasix daily at home - Follow I's and O's and daily weights - check ambulatory pulse ox    AKI-CKD 3B - Baseline creatinine is around 1.6-1.7 - Suspect AKI is related to poor cardiac output - Creatinine has improved slightly to 2.17 today-continue to follow   Urinary retention due to benign prostatic hyperplasia - Placed Foley catheter - He takes Flomax at home which is being continued - added Proscar - will give voiding trial today   Urinary tract infection - Urine from 10/15 grew Klebsiella pneumonia and E faecalis and is growing the same now - At this time he is receiving ceftriaxone- will add amoxil - WBC count  11.6> 15.4> 13   Microcytic anemia - Hemoglobin was 8.9, MCV 71, MCH 21 -  will give Iron infusion today  TOC PLAN OF CARE: To return home with self care lives with husband. Following for progression and possible home heart failure screening. [facility]. Expected Discharge Plan: Home/Self Care Barriers to Discharge: Continued Medical Work up   Patient Goals and CMS Choice Patient states their goals for this hospitalization and ongoing recovery are:: to go back home CMS Medicare.gov Compare Post Acute Care list provided to:: Patient    Expected Discharge Plan and Services Expected Discharge Plan: Home/Self Care   Discharge Planning Services: CM Consult   Living arrangements for the past 2 months: Single Family Home                                      Prior Living Arrangements/Services Living arrangements for the past 2 months: Single Family Home Lives with:: Spouse Patient language and need for interpreter reviewed:: Yes Do you feel safe going back to the place where you live?: Yes      Need for Family Participation in Patient Care: Yes (Comment) Care giver support system in place?: Yes (comment)   Criminal Activity/Legal Involvement Pertinent to Current Situation/Hospitalization: No - Comment as needed  Activities of Daily Living Home Assistive Devices/Equipment: None ADL Screening (condition at time of admission) Patient's cognitive ability adequate to safely complete daily activities?: Yes Is the patient deaf or have difficulty hearing?: No Does the patient have difficulty seeing, even when wearing glasses/contacts?: Yes Does the patient have difficulty concentrating, remembering, or making decisions?: No Patient able to express need for assistance with ADLs?: Yes Does the patient have difficulty dressing or bathing?: No Independently performs ADLs?: Yes (appropriate for developmental age) Does the  patient have difficulty walking or climbing stairs?: No Weakness of Legs: None Weakness of Arms/Hands: None  Permission Sought/Granted                  Emotional Assessment Appearance:: Appears stated age Attitude/Demeanor/Rapport: Engaged Affect (typically observed): Calm Orientation: : Oriented to Place, Oriented to Self, Oriented to  Time, Oriented to Situation Alcohol / Substance Use: Not Applicable Psych Involvement: No (comment)  Admission diagnosis:  SOB (shortness of breath) [R06.02] Acute CHF (congestive heart failure) (HCC) [I50.9] Acute on chronic systolic congestive heart failure ([place]) [I50.23] AKI (acute kidney injury) ([place]) [N17.9] Acute anemia [O29.4] Acute systolic CHF (congestive heart failure) ([place]) [I50.21] Patient Active Problem List   Diagnosis Date Noted   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   Urinary tract infection 11/16/2021   Microcytic anemia 11/16/2021   Thrombocytosis 76/54/6503   Acute systolic CHF (congestive heart failure) ([place]) 11/16/2021   Bradycardia 07/05/2021   Hematuria 07/05/2021   Anemia 07/05/2021   Acute on chronic HFrEF (heart failure with reduced ejection fraction) ([place]) 07/01/2021   CAD, 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   Stage 3b chronic kidney disease ([place])    Primary hypertension    Mixed hyperlipidemia    AKI-CKD 3B    NSTEMI (non-ST elevated myocardial infarction) ([place])    Hypertensive crisis 01/29/2021   Acute respiratory failure with hypoxia ([place]) 01/29/2021   PCP:  Jilda Panda, MD Pharmacy:   [facility] [place], [address] [place] [address] Phone: [phone] Fax: [phone]  [facility] [address] [place] [address] Phone: [phone] Fax: [phone]     Social Determinants of Health (SDOH) Interventions    Readmission Risk  Interventions No flowsheet data found.

## 2021-11-19 DIAGNOSIS — N179 Acute kidney failure, unspecified: Secondary | ICD-10-CM

## 2021-11-19 LAB — CBC
HCT: 35.7 % — ABNORMAL LOW (ref 39.0–52.0)
Hemoglobin: 10.6 g/dL — ABNORMAL LOW (ref 13.0–17.0)
MCH: 20.9 pg — ABNORMAL LOW (ref 26.0–34.0)
MCHC: 29.7 g/dL — ABNORMAL LOW (ref 30.0–36.0)
MCV: 70.3 fL — ABNORMAL LOW (ref 80.0–100.0)
Platelets: 709 10*3/uL — ABNORMAL HIGH (ref 150–400)
RBC: 5.08 MIL/uL (ref 4.22–5.81)
RDW: 17.9 % — ABNORMAL HIGH (ref 11.5–15.5)
WBC: 10.2 10*3/uL (ref 4.0–10.5)
nRBC: 0 % (ref 0.0–0.2)

## 2021-11-19 LAB — BASIC METABOLIC PANEL
Anion gap: 10 (ref 5–15)
BUN: 54 mg/dL — ABNORMAL HIGH (ref 8–23)
CO2: 28 mmol/L (ref 22–32)
Calcium: 9.4 mg/dL (ref 8.9–10.3)
Chloride: 92 mmol/L — ABNORMAL LOW (ref 98–111)
Creatinine, Ser: 2.07 mg/dL — ABNORMAL HIGH (ref 0.61–1.24)
GFR, Estimated: 31 mL/min — ABNORMAL LOW (ref >60)
Glucose, Bld: 151 mg/dL — ABNORMAL HIGH (ref 70–99)
Potassium: 4.2 mmol/L (ref 3.5–5.1)
Sodium: 130 mmol/L — ABNORMAL LOW (ref 135–145)

## 2021-11-19 MED ORDER — CYANOCOBALAMIN 250 MCG PO TABS
500.0000 ug | ORAL_TABLET | Freq: Every day | ORAL | Status: DC
  Administered 2021-11-20: 500 ug via ORAL
  Filled 2021-11-19: qty 2

## 2021-11-19 MED ORDER — CYANOCOBALAMIN 1000 MCG/ML IJ SOLN
1000.0000 ug | Freq: Once | INTRAMUSCULAR | Status: AC
  Administered 2021-11-19: 1000 ug via SUBCUTANEOUS
  Filled 2021-11-19: qty 1

## 2021-11-19 NOTE — Care Management Important Message (Signed)
Important Message  Patient Details IM Letter placed in Patients room. Name: Randy [name] MRN: [id number] Date of Birth: 1941-02-22   Medicare Important Message Given:  Yes     Kerin Salen 11/19/2021, 11:18 AM

## 2021-11-19 NOTE — Plan of Care (Signed)

## 2021-11-19 NOTE — Progress Notes (Signed)
[facility] Progress Note  Patient: Randy [name]    OFB:[id number]  DOA: 11/15/2021    Date of Service: the patient was seen and examined on 11/19/2021  Brief hospital course: This is an 80 year old male with a medical history of chronic systolic heart failure, coronary artery disease and hypertension who presents with shortness of breath and difficulty urinating.  He describes orthopnea dyspnea on exertion and possibly some fevers although, he did not check his temperature. In the ED he was found to have pulmonary edema on exam and prior chest x-ray.  He was also found to have urinary retention and a Foley catheter was placed.  The UA was consistent with a UTI.  Also noted was a creatinine that was elevated at 2.32 compared to a baseline creatinine of 1.6.  The patient was started on antibiotics and Lasix.   Subjective:  No new complaints.  Assessment and Plan: Acute on chronic HFrEF (heart failure with reduced ejection fraction) (HCC) - Pulmonary edema noted on exam and on chest x-ray -The patient's respiratory rate is going up into the 30s while at rest and he also complains of orthopnea - Pulse ox has been ranging 96 to 98% on room air - he is supposed to be taking 80 mg of Lasix daily at home - Follow I's and O's and daily weights - symptoms resolved-   - 2 D ECHO repeated and EF found to be 25%, Left ventricular diastolic parameters are indeterminate. He has moderate to severe MR - started Coreg  - avoid ACE/ARB due to creatinine level - IV Lasix held yesterday- today he does not appear fluid overloaded so I plan to continue to hold it and see if his renal function recuperates  AKI-CKD 3B - Baseline creatinine is around 1.6-1.7 - presented with Cr of ~ 2.2 - Creatinine has, unfortunately not improved despite foley and despite diuresis- ? If this is his new baseline - would like to monitor for one more day to see if renal function is improving  Urinary retention due  to benign prostatic hyperplasia - Placed Foley catheter- added Proscar - He takes Flomax at home which is being continued - voiding trial given on 11/27- able to void without issues  Urinary tract infection - Urine from 10/15 grew Klebsiella pneumonia and E faecalis and is growing the same now -  cont Keflex and Amoxil for a total of 7 days - WBC count 11.6> 15.4> 13> 11.5> 10.2  Microcytic anemia - Hemoglobin was 8.9, MCV 71, MCH 21 -   given Iron infusion 11/27 - cont oral Iron  Low normal Vit B12 level - start oral replacement and give one time s/c 1000 mcg   Body mass index is 28 kg/m.         Objective: Vitals with BMI 11/19/2021 11/18/2021 11/18/2021  Height 4\' 9"  - -  Weight 129 lbs 7 oz - -  BMI 28 - -  Systolic [phone]  Diastolic 53 60 67  Pulse 73 70 74     Exam: General exam: Appears comfortable  HEENT: PERRLA, oral mucosa moist, no sclera icterus or thrush Respiratory system: Clear to auscultation. Respiratory effort normal. Cardiovascular system: S1 & S2 heard, regular rate and rhythm Gastrointestinal system: Abdomen soft, non-tender, nondistended. Normal bowel sounds   Central nervous system: Alert and oriented. No focal neurological deficits. Extremities: No cyanosis, clubbing or edema Skin: No rashes or ulcers Psychiatry:  Mood & affect appropriate.    Data Reviewed: CBC  Component Value Date/Time   WBC 10.2 11/19/2021 0822   RBC 5.08 11/19/2021 0822   HGB 10.6 (L) 11/19/2021 0822   HCT 35.7 (L) 11/19/2021 0822   PLT 709 (H) 11/19/2021 0822   MCV 70.3 (L) 11/19/2021 0822   MCH 20.9 (L) 11/19/2021 0822   MCHC 29.7 (L) 11/19/2021 0822   RDW 17.9 (H) 11/19/2021 0822   LYMPHSABS 1.2 11/15/2021 0349   MONOABS 1.2 (H) 11/15/2021 0349   EOSABS 0.2 11/15/2021 0349   BASOSABS 0.0 11/15/2021 0349    BMET    Component Value Date/Time   NA 130 (L) 11/19/2021 0822   K 4.2 11/19/2021 0822   CL 92 (L) 11/19/2021 0822   CO2 28 11/19/2021  0822   GLUCOSE 151 (H) 11/19/2021 0822   BUN 54 (H) 11/19/2021 0822   CREATININE 2.07 (H) 11/19/2021 0822   CALCIUM 9.4 11/19/2021 0822   GFRNONAA 31 (L) 11/19/2021 0822     Disposition:  Status is: Inpatient- He is homeless and [facility] a [facility]- TOC is giving him information regarding housing       Family Communication:    DVT Prophylaxis: SCDs Start: 11/15/21 1340   Time spent: 35 minutes.   Author: Debbe Odea  11/19/2021 11:38 AM  To reach On-call, see care teams to locate the attending and reach out via [email or web address]. Between 7PM-7AM, please contact night-coverage If you still have difficulty reaching the attending provider, please page the [facility] (Director on Call) for [facility] on amion for assistance.

## 2021-11-20 ENCOUNTER — Encounter (HOSPITAL_COMMUNITY): Payer: Self-pay | Admitting: Internal Medicine

## 2021-11-20 DIAGNOSIS — I5023 Acute on chronic systolic (congestive) heart failure: Secondary | ICD-10-CM

## 2021-11-20 LAB — CULTURE, BLOOD (ROUTINE X 2)
Culture: NO GROWTH
Culture: NO GROWTH
Special Requests: ADEQUATE
Special Requests: ADEQUATE

## 2021-11-20 MED ORDER — CARVEDILOL 3.125 MG PO TABS: 3.1250 mg | ORAL_TABLET | Freq: Two times a day (BID) | ORAL | 0 refills | Status: DC

## 2021-11-20 MED ORDER — CEPHALEXIN 500 MG PO CAPS
500.0000 mg | ORAL_CAPSULE | Freq: Three times a day (TID) | ORAL | 0 refills | Status: AC
Start: 2021-11-20 — End: 2021-11-22

## 2021-11-20 MED ORDER — AMOXICILLIN 250 MG PO CAPS: 250.0000 mg | ORAL_CAPSULE | Freq: Two times a day (BID) | ORAL | 0 refills | Status: AC

## 2021-11-20 MED ORDER — FINASTERIDE 5 MG PO TABS: 5.0000 mg | ORAL_TABLET | Freq: Every day | ORAL | 0 refills | Status: DC

## 2021-11-20 MED ORDER — FERROUS SULFATE 325 (65 FE) MG PO TABS: 325.0000 mg | ORAL_TABLET | Freq: Every day | ORAL | 0 refills | Status: DC

## 2021-11-20 MED ORDER — DOCUSATE SODIUM 100 MG PO CAPS: 100.0000 mg | ORAL_CAPSULE | Freq: Two times a day (BID) | ORAL | 0 refills | Status: DC

## 2021-11-20 MED ORDER — CYANOCOBALAMIN 500 MCG PO TABS: 500.0000 ug | ORAL_TABLET | Freq: Every day | ORAL | 0 refills | Status: AC

## 2021-11-20 NOTE — Assessment & Plan Note (Signed)
Blood pressure stable.  Outpatient follow-up recommended with PCP.

## 2021-11-20 NOTE — TOC Transition Note (Signed)
Transition of Care [facility]) - CM/SW Discharge Note   Patient Details  Name: Randy [name] MRN: [id number] Date of Birth: 1941-05-02  [facility]) CM/SW Contact:  Leeroy Cha, RN Phone Number: 11/20/2021, 3:14 PM   Clinical Narrative:    Lpstient has resources for obtaining housing and transportation.  [place] [facility] [place] in [place].   Final next level of care: Home/Self Care Barriers to Discharge: Barriers Resolved   Patient Goals and CMS Choice Patient states their goals for this hospitalization and ongoing recovery are:: to go back home CMS Medicare.gov Compare Post Acute Care list provided to:: Patient    Discharge Placement                       Discharge Plan and Services   Discharge Planning Services: CM Consult Post Acute Care Choice: Home Health                    HH Arranged: Disease Management [place] Agency: [place] Date [place]: 11/18/21 Time [place]: 0807 Representative spoke with at [place]: stacie  Social Determinants of Health ([place]) Interventions     Readmission Risk Interventions No flowsheet data found.

## 2021-11-20 NOTE — Discharge Summary (Signed)
Physician Discharge Summary   Patient name: Randy [name]  Admit date:     11/15/2021  Discharge date: 11/20/2021  Discharge Physician: Berle Mull   PCP: Jilda Panda, MD   Recommendations at discharge: Follow-up with PCP in 1 week.  Discharge Diagnoses Principal Problem:   Acute on chronic HFrEF (heart failure with reduced ejection fraction) (HCC) Active Problems:   AKI-CKD 3B   CAD, multiple vessel   Primary hypertension   Urinary retention due to benign prostatic hyperplasia   Urinary tract infection   Microcytic anemia   Thrombocytosis   Acute systolic CHF (congestive heart failure) [facility])  Hospital Course   This is an 80 year old male with a medical history of chronic systolic heart failure, coronary artery disease and hypertension who presents with shortness of breath and difficulty urinating.  He describes orthopnea dyspnea on exertion and possibly some fevers although, he did not check his temperature. In the ED he was found to have pulmonary edema on exam and prior chest x-ray.  He was also found to have urinary retention and a Foley catheter was placed.  The UA was consistent with a UTI.  Also noted was a creatinine that was elevated at 2.32 compared to a baseline creatinine of 1.6.  The patient was started on antibiotics and Lasix.   * Acute on chronic HFrEF (heart failure with reduced ejection fraction) ([place]) Presents with shortness of breath. Chest x-ray shows vascular congestion. Echocardiogram shows EF of 45%.  Moderate to severe MR. Currently saturating on room air. Treated with IV diuresis. Suspect renal function mildly elevated secondary to overdiuresis therefore will resume home Lasix on discharge.  AKI-CKD 3B Baseline creatinine is around 1.6-1.7 Suspect AKI is related to poor cardiac output and overdiuresis. Creatinine has improved slightly, continue home regimen.  Urinary tract infection Urine from 10/15 grew Klebsiella pneumonia and E faecalis  and is growing the same now continue Keflex & amoxil WBC>11.6 > 15.4 > 13.0  CAD, multiple vessel Multivessel CAD on left heart cath, not a candidate for CABG due to calcification and PCI due to renal dysfunction. Managed medically. Outpatient follow-up recommended.  Urinary retention due to benign prostatic hyperplasia Patient had a Foley catheter for retention. Foley catheter removed and patient was able to void before discharge. Continue Flomax. Continue Proscar.  Primary hypertension Blood pressure stable.  Outpatient follow-up recommended with PCP.  Microcytic anemia - Hemoglobin was 8.9, MCV 71, MCH 21 - Continue oral iron supplementation for now.  Thrombocytosis Appears to be chronic in nature. Outpatient follow-up with PCP.  May require hematology referral.  Procedures performed: Echocardiogram    Condition at discharge: good  Exam General: Appear in mild distress, no Rash; Oral Mucosa Clear, moist. no Abnormal Neck Mass Or lumps, Conjunctiva normal  Cardiovascular: S1 and S2 Present, no Murmur, Respiratory: good respiratory effort, Bilateral Air entry present and CTA, no Crackles, no wheezes Abdomen: Bowel Sound present, Soft and no tenderness Extremities: no Pedal edema Neurology: alert and oriented to time, place, and person affect appropriate. no new focal deficit Gait not checked due to patient safety concerns    Disposition: Home  Discharge time: greater than 30 minutes.   Allergies as of 11/20/2021   No Known Allergies      Medication List     STOP taking these medications    furosemide 80 MG tablet Commonly known as: LASIX       TAKE these medications    acetaminophen 325 MG tablet Commonly known as: TYLENOL Take  2 tablets (650 mg total) by mouth every 4 (four) hours as needed for headache or mild pain.   amoxicillin 250 MG capsule Commonly known as: AMOXIL Take 1 capsule (250 mg total) by mouth every 12 (twelve) hours for 2 days.    aspirin 81 MG chewable tablet Chew 1 tablet (81 mg total) by mouth daily.   atorvastatin 80 MG tablet Commonly known as: LIPITOR Take 1 tablet (80 mg total) by mouth daily.   BiDil 20-37.5 MG tablet Generic drug: isosorbide-hydrALAZINE Take 1 tablet by mouth 3 (three) times daily.   carvedilol 3.125 MG tablet Commonly known as: COREG Take 1 tablet (3.125 mg total) by mouth 2 (two) times daily with a meal. What changed:  medication strength how much to take   cephALEXin 500 MG capsule Commonly known as: KEFLEX Take 1 capsule (500 mg total) by mouth 3 (three) times daily for 2 days.   clopidogrel 75 MG tablet Commonly known as: PLAVIX Take 1 tablet (75 mg total) by mouth daily.   docusate sodium 100 MG capsule Commonly known as: COLACE Take 1 capsule (100 mg total) by mouth 2 (two) times daily.   ferrous sulfate 325 (65 FE) MG tablet Take 1 tablet (325 mg total) by mouth daily with breakfast. Start taking on: November 21, 2021   finasteride 5 MG tablet Commonly known as: PROSCAR Take 1 tablet (5 mg total) by mouth daily. Start taking on: November 21, 2021   Jardiance 10 MG Tabs tablet Generic drug: empagliflozin Take 10 mg by mouth daily.   nitroGLYCERIN 0.4 MG SL tablet Commonly known as: NITROSTAT Place 1 tablet (0.4 mg total) under the tongue every 5 (five) minutes x 3 doses as needed for chest pain.   pantoprazole 40 MG tablet Commonly known as: PROTONIX Take 1 tablet (40 mg total) by mouth daily at 6 (six) AM.   tamsulosin 0.4 MG Caps capsule Commonly known as: FLOMAX Take 1 capsule (0.4 mg total) by mouth 2 (two) times daily.   vitamin B-12 500 MCG tablet Commonly known as: CYANOCOBALAMIN Take 1 tablet (500 mcg total) by mouth daily. Start taking on: November 21, 2021        US RENAL  Result Date: 11/15/2021 CLINICAL DATA:  Renal dysfunction EXAM: RENAL / URINARY TRACT ULTRASOUND COMPLETE COMPARISON:  None. FINDINGS: Right Kidney: Renal  measurements: 8.4 x 4.2 x 4.9 cm = volume: 89.6 mL. There is increased cortical echogenicity. There is prominence of renal pelvis. There is no perinephric fluid collection. Left Kidney: Renal measurements: 8 x 5 x 5 cm = volume: 104.4 mL. There is increased cortical echogenicity. There is mild ectasia of renal pelvis. There is no perinephric fluid collection. Bladder: Urinary bladder is unremarkable. Technologist observed ureteral jets at both ureterovesical junctions. Prostate is enlarged projecting into the base of the bladder. Prostate measures approximately 6 x 5.8 x 6.2 cm. Other: Incidental note is made of 6 mm hyperechoic focus in the spleen. This may suggest small calcification or hemangioma. IMPRESSION: There is increased cortical echogenicity suggesting medical renal disease. There is mild pelviectasis in both kidneys without significant dilation of minor calices. Enlarged prostate. Electronically Signed   By: Elmer Picker M.D.   On: 11/15/2021 09:17   DG Chest Port 1 View  Result Date: 11/15/2021 CLINICAL DATA:  Shortness of breath. EXAM: PORTABLE CHEST 1 VIEW COMPARISON:  06/30/2021. FINDINGS: The heart is mildly enlarged. There is atherosclerotic calcification of the aorta. The pulmonary vasculature is prominent. Lung volumes are low  and there is airspace disease at the lung bases, greater on the left than on the right. No definite effusion or pneumothorax. No acute osseous abnormality. IMPRESSION: 1. Cardiomegaly with pulmonary vascular congestion. 2. Mild airspace disease at the lung bases, possible edema or infiltrate. Electronically Signed   By: Brett Fairy M.D.   On: 11/15/2021 03:48   ECHOCARDIOGRAM COMPLETE  Result Date: 11/16/2021    ECHOCARDIOGRAM REPORT   Patient Name:   JAMMAL SARR Date of Exam: 11/16/2021 Medical Rec #:  [id number]          Height:       57.0 in Accession #:    [id number]         Weight:       135.0 lb Date of Birth:  Aug 04, 1941           BSA:           1.522 m Patient Age:    33 years           BP:           137/64 mmHg Patient Gender: M                  HR:           58 bpm. Exam Location:  Inpatient Procedure: 2D Echo, Cardiac Doppler and Color Doppler Indications:    Q65.78 Acute systolic (congestive) heart failure  History:        Patient has prior history of Echocardiogram examinations, most                 recent 07/01/2021. CHF, Previous Myocardial Infarction and CAD,                 Arrythmias:Bradycardia; Risk Factors:Hypertension.  Sonographer:    Glo Herring Referring Phys: [id number] [place]  1. Left ventricular ejection fraction, by estimation, is 25%. The left ventricle has severely decreased function. The left ventricle demonstrates regional wall motion abnormalities (see scoring diagram/findings for description). There is mild left ventricular hypertrophy. Left ventricular diastolic parameters are indeterminate.  2. Right ventricular systolic function is mildly reduced. The right ventricular size is normal. There is mildly elevated pulmonary artery systolic pressure. The estimated right ventricular systolic pressure is 28.4 mmHg.  3. Left atrial size was severely dilated.  4. Right atrial size was moderately dilated.  5. The mitral valve is grossly normal. Moderate to severe mitral valve regurgitation. Systolic flow reversals noted in the right inferior pulmonary vein. MR mechanism likely secondary to LV dysfunction, leaflet tenting noted. No evidence of mitral stenosis.  6. The aortic valve is grossly normal. There is mild calcification of the aortic valve. There is mild thickening of the aortic valve. Aortic valve regurgitation is mild. Aortic valve sclerosis/calcification is present, without any evidence of aortic stenosis.  7. The inferior vena cava is dilated in size with >50% respiratory variability, suggesting right atrial pressure of 8 mmHg. FINDINGS  Left Ventricle: Left ventricular ejection fraction, by estimation, is  25%. The left ventricle has severely decreased function. The left ventricle demonstrates regional wall motion abnormalities. The left ventricular internal cavity size was normal in size. There is mild left ventricular hypertrophy. Left ventricular diastolic parameters are indeterminate.  LV Wall Scoring: The entire inferior wall, mid anteroseptal segment, and mid inferolateral segment are akinetic. The inferior septum, basal anteroseptal segment, basal inferolateral segment, apical lateral segment, and apical anterior segment are hypokinetic. Right Ventricle: The right ventricular size is normal.  No increase in right ventricular wall thickness. Right ventricular systolic function is mildly reduced. There is mildly elevated pulmonary artery systolic pressure. The tricuspid regurgitant velocity  is 3.01 m/s, and with an assumed right atrial pressure of 8 mmHg, the estimated right ventricular systolic pressure is 09.2 mmHg. Left Atrium: Left atrial size was severely dilated. Right Atrium: Right atrial size was moderately dilated. Pericardium: There is no evidence of pericardial effusion. Mitral Valve: The mitral valve is grossly normal. Moderate to severe mitral valve regurgitation. No evidence of mitral valve stenosis. Tricuspid Valve: The tricuspid valve is normal in structure. Tricuspid valve regurgitation is mild . No evidence of tricuspid stenosis. Aortic Valve: The aortic valve is grossly normal. There is mild calcification of the aortic valve. There is mild thickening of the aortic valve. Aortic valve regurgitation is mild. Aortic valve sclerosis/calcification is present, without any evidence of aortic stenosis. Aortic valve mean gradient measures 5.0 mmHg. Aortic valve peak gradient measures 9.1 mmHg. Aortic valve area, by VTI measures 1.45 cm. Pulmonic Valve: The pulmonic valve was normal in structure. Pulmonic valve regurgitation is trivial. No evidence of pulmonic stenosis. Aorta: The aortic root is normal  in size and structure. Venous: A pattern of systolic flow reversal, suggestive of severe mitral regurgitation is recorded from the right lower pulmonary vein. The inferior vena cava is dilated in size with greater than 50% respiratory variability, suggesting right atrial pressure of 8 mmHg. IAS/Shunts: No atrial level shunt detected by color flow Doppler.  LEFT VENTRICLE PLAX 2D LVIDd:         5.40 cm      Diastology LVIDs:         4.40 cm      LV e' medial:    3.48 cm/s LV PW:         1.20 cm      LV E/e' medial:  33.0 LV IVS:        1.20 cm      LV e' lateral:   5.87 cm/s LVOT diam:     2.00 cm      LV E/e' lateral: 19.6 LV SV:         44 LV SV Index:   29 LVOT Area:     3.14 cm     3D Volume EF                             LV 3D EDV:   152.71 ml                             LV 3D ESV:   97.60 ml LV Volumes (MOD) LV vol d, MOD A2C: 148.0 ml 3D Volume EF LV vol d, MOD A4C: 155.0 ml LV 3D EF:    37.00 % LV vol s, MOD A2C: 89.9 ml  LV 3D EDV:   128100.00 mm LV vol s, MOD A4C: 95.5 ml  LV 3D ESV:   80700.00 mm LV SV MOD A2C:     58.1 ml  LV 3D SV:    47400.00 mm LV SV MOD A4C:     155.0 ml LV SV MOD BP:      58.1 ml RIGHT VENTRICLE            IVC RV Basal diam:  4.20 cm    IVC diam: 2.10 cm RV S prime:     9.14 cm/s  LEFT ATRIUM             Index        RIGHT ATRIUM           Index LA diam:        5.30 cm 3.48 cm/m   RA Area:     20.80 cm LA Vol (A2C):   91.2 ml 59.92 ml/m  RA Volume:   54.00 ml  35.48 ml/m LA Vol (A4C):   98.7 ml 64.85 ml/m LA Biplane Vol: 99.4 ml 65.31 ml/m  AORTIC VALVE                     PULMONIC VALVE AV Area (Vmax):    1.49 cm      PV Vmax:       0.81 m/s AV Area (Vmean):   1.53 cm      PV Peak grad:  2.6 mmHg AV Area (VTI):     1.45 cm AV Vmax:           151.00 cm/s AV Vmean:          100.000 cm/s AV VTI:            0.306 m AV Peak Grad:      9.1 mmHg AV Mean Grad:      5.0 mmHg LVOT Vmax:         71.80 cm/s LVOT Vmean:        48.600 cm/s LVOT VTI:          0.141 m LVOT/AV VTI ratio:  0.46  AORTA Ao Root diam: 3.30 cm Ao Asc diam:  3.20 cm MITRAL VALVE                  TRICUSPID VALVE MV Area (PHT): 5.70 cm       TR Peak grad:   36.2 mmHg MV Decel Time: 133 msec       TR Vmax:        301.00 cm/s MR Peak grad:    104.0 mmHg MR Mean grad:    60.0 mmHg    SHUNTS MR Vmax:         510.00 cm/s  Systemic VTI:  0.14 m MR Vmean:        363.0 cm/s   Systemic Diam: 2.00 cm MR PISA:         1.57 cm MR PISA Eff ROA: 11 mm MR PISA Radius:  0.50 cm MV E velocity: 115.00 cm/s Cherlynn Kaiser MD Electronically signed by Cherlynn Kaiser MD Signature Date/Time: 11/16/2021/1:03:58 PM    Final    Results for orders placed or performed during the hospital encounter of 11/15/21  Resp Panel by RT-PCR (Flu A&B, Covid) Nasopharyngeal Swab     Status: None   Collection Time: 11/15/21  3:49 AM   Specimen: Nasopharyngeal Swab; Nasopharyngeal(NP) swabs in vial transport medium  Result Value Ref Range Status   SARS Coronavirus 2 by RT PCR NEGATIVE NEGATIVE Final    Comment: (NOTE) SARS-CoV-2 target nucleic acids are NOT DETECTED.  The SARS-CoV-2 RNA is generally detectable in upper respiratory specimens during the acute phase of infection. The lowest concentration of SARS-CoV-2 viral copies this assay can detect is 138 copies/mL. A negative result does not preclude SARS-Cov-2 infection and should not be used as the sole basis for treatment or other patient management decisions. A negative result may occur with  improper specimen collection/handling, submission of specimen other than nasopharyngeal swab, presence of viral  mutation(s) within the areas targeted by this assay, and inadequate number of viral copies(<138 copies/mL). A negative result must be combined with clinical observations, patient history, and epidemiological information. The expected result is Negative.  Fact Sheet for Patients:  EntrepreneurPulse.com.au  Fact Sheet for Healthcare Providers:   IncredibleEmployment.be  This test is no t yet approved or cleared by the [place] FDA and  has been authorized for detection and/or diagnosis of SARS-CoV-2 by FDA under an Emergency Use Authorization (EUA). This EUA will remain  in effect (meaning this test can be used) for the duration of the COVID-19 declaration under Section 564(b)(1) of the Act, 21 U.S.C.section 360bbb-3(b)(1), unless the authorization is terminated  or revoked sooner.       Influenza A by PCR NEGATIVE NEGATIVE Final   Influenza B by PCR NEGATIVE NEGATIVE Final    Comment: (NOTE) The Xpert Xpress SARS-CoV-2/FLU/RSV plus assay is intended as an aid in the diagnosis of influenza from Nasopharyngeal swab specimens and should not be used as a sole basis for treatment. Nasal washings and aspirates are unacceptable for Xpert Xpress SARS-CoV-2/FLU/RSV testing.  Fact Sheet for Patients: EntrepreneurPulse.com.au  Fact Sheet for Healthcare Providers: IncredibleEmployment.be  This test is not yet approved or cleared by the [place] FDA and has been authorized for detection and/or diagnosis of SARS-CoV-2 by FDA under an Emergency Use Authorization (EUA). This EUA will remain in effect (meaning this test can be used) for the duration of the COVID-19 declaration under Section 564(b)(1) of the Act, 21 U.S.C. section 360bbb-3(b)(1), unless the authorization is terminated or revoked.  Performed at [facility], [place] [address] [place], [place] [address]   Urine Culture     Status: Abnormal   Collection Time: 11/15/21  8:24 AM   Specimen: In/Out Cath Urine  Result Value Ref Range Status   Specimen Description   Final    IN/OUT CATH URINE Performed at [facility] [address] [place], [place] [address]    Special Requests   Final    NONE Performed at [facility], [place] [address],  [place], [place] [address]    Culture (A)  Final    >=100,000 COLONIES/mL KLEBSIELLA PNEUMONIAE 90,000 COLONIES/mL ENTEROCOCCUS FAECALIS    Report Status 11/18/2021 FINAL  Final   Organism ID, Bacteria KLEBSIELLA PNEUMONIAE (A)  Final   Organism ID, Bacteria ENTEROCOCCUS FAECALIS (A)  Final      Susceptibility   Enterococcus faecalis - MIC*    AMPICILLIN <=2 SENSITIVE Sensitive     NITROFURANTOIN <=16 SENSITIVE Sensitive     VANCOMYCIN 1 SENSITIVE Sensitive     * 90,000 COLONIES/mL ENTEROCOCCUS FAECALIS   Klebsiella pneumoniae - MIC*    AMPICILLIN >=32 RESISTANT Resistant     CEFAZOLIN <=4 SENSITIVE Sensitive     CEFEPIME <=0.12 SENSITIVE Sensitive     CEFTRIAXONE <=0.25 SENSITIVE Sensitive     CIPROFLOXACIN <=0.25 SENSITIVE Sensitive     GENTAMICIN <=1 SENSITIVE Sensitive     IMIPENEM <=0.25 SENSITIVE Sensitive     NITROFURANTOIN 128 RESISTANT Resistant     TRIMETH/SULFA <=20 SENSITIVE Sensitive     AMPICILLIN/SULBACTAM 16 INTERMEDIATE Intermediate     PIP/TAZO <=4 SENSITIVE Sensitive     * >=100,000 COLONIES/mL KLEBSIELLA PNEUMONIAE  Culture, blood (routine x 2)     Status: None   Collection Time: 11/15/21  3:29 PM   Specimen: Right Antecubital; Blood  Result Value Ref Range Status   Specimen Description   Final  RIGHT ANTECUBITAL Performed at [facility] [address], [place], [place] [address]    Special Requests   Final    BOTTLES DRAWN AEROBIC ONLY Blood Culture adequate volume Performed at [facility] [address], [place], [place] [address]    Culture   Final    NO GROWTH 5 DAYS Performed at [facility], [place] [address], [place], [place] [address]    Report Status 11/20/2021 FINAL  Final  Culture, blood (routine x 2)     Status: None   Collection Time: 11/15/21  3:29 PM   Specimen: BLOOD  Result Value Ref Range Status   Specimen Description   Final    BLOOD LEFT ANTECUBITAL Performed at [facility] [address], [place], [place] [address]    Special Requests   Final    BOTTLES DRAWN AEROBIC ONLY Blood Culture adequate volume Performed at [facility] [address], [place], [place] [address]    Culture   Final    NO GROWTH 5 DAYS Performed at [facility], [place] [address] [place],  [address]    Report Status 11/20/2021 FINAL  Final    Signed:  Berle Mull MD.  [facility] 11/20/2021, 2:53 PM

## 2021-11-20 NOTE — Progress Notes (Signed)
Pt discharged in stable condition via wheelchair by NT. Set up rideshare/transport offered through the [facility] in order to get pt to correct destination in which he agreed with. Went over AVS instructions with pt.

## 2021-11-20 NOTE — Assessment & Plan Note (Signed)
Multivessel CAD on left heart cath, not a candidate for CABG due to calcification and PCI due to renal dysfunction. Managed medically. Outpatient follow-up recommended.

## 2021-11-20 NOTE — Assessment & Plan Note (Signed)
Appears to be chronic in nature. Outpatient follow-up with PCP.  May require hematology referral.

## 2021-11-21 ENCOUNTER — Emergency Department (HOSPITAL_COMMUNITY): Payer: Medicare Other

## 2021-11-21 ENCOUNTER — Inpatient Hospital Stay (HOSPITAL_COMMUNITY)
Admission: EM | Admit: 2021-11-21 | Discharge: 2021-11-28 | DRG: 280 | Disposition: A | Discharge status: Home Health | Payer: Medicare Other | Attending: Internal Medicine | Admitting: Internal Medicine

## 2021-11-21 DIAGNOSIS — R7989 Other specified abnormal findings of blood chemistry: Secondary | ICD-10-CM | POA: Diagnosis not present

## 2021-11-21 DIAGNOSIS — E1165 Type 2 diabetes mellitus with hyperglycemia: Secondary | ICD-10-CM | POA: Diagnosis present

## 2021-11-21 DIAGNOSIS — N179 Acute kidney failure, unspecified: Secondary | ICD-10-CM | POA: Diagnosis not present

## 2021-11-21 DIAGNOSIS — R778 Other specified abnormalities of plasma proteins: Secondary | ICD-10-CM | POA: Diagnosis not present

## 2021-11-21 DIAGNOSIS — N390 Urinary tract infection, site not specified: Secondary | ICD-10-CM | POA: Diagnosis present

## 2021-11-21 DIAGNOSIS — J9601 Acute respiratory failure with hypoxia: Secondary | ICD-10-CM | POA: Diagnosis present

## 2021-11-21 DIAGNOSIS — I5021 Acute systolic (congestive) heart failure: Secondary | ICD-10-CM | POA: Diagnosis not present

## 2021-11-21 DIAGNOSIS — I4891 Unspecified atrial fibrillation: Secondary | ICD-10-CM | POA: Diagnosis present

## 2021-11-21 DIAGNOSIS — R339 Retention of urine, unspecified: Secondary | ICD-10-CM | POA: Diagnosis present

## 2021-11-21 DIAGNOSIS — J9602 Acute respiratory failure with hypercapnia: Secondary | ICD-10-CM | POA: Diagnosis present

## 2021-11-21 DIAGNOSIS — I5043 Acute on chronic combined systolic (congestive) and diastolic (congestive) heart failure: Secondary | ICD-10-CM | POA: Diagnosis present

## 2021-11-21 DIAGNOSIS — I130 Hypertensive heart and chronic kidney disease with heart failure and stage 1 through stage 4 chronic kidney disease, or unspecified chronic kidney disease: Principal | ICD-10-CM | POA: Diagnosis present

## 2021-11-21 DIAGNOSIS — R0602 Shortness of breath: Secondary | ICD-10-CM | POA: Diagnosis not present

## 2021-11-21 DIAGNOSIS — K921 Melena: Secondary | ICD-10-CM | POA: Diagnosis not present

## 2021-11-21 DIAGNOSIS — R739 Hyperglycemia, unspecified: Secondary | ICD-10-CM

## 2021-11-21 DIAGNOSIS — D62 Acute posthemorrhagic anemia: Secondary | ICD-10-CM | POA: Diagnosis not present

## 2021-11-21 DIAGNOSIS — N184 Chronic kidney disease, stage 4 (severe): Secondary | ICD-10-CM | POA: Diagnosis present

## 2021-11-21 DIAGNOSIS — J810 Acute pulmonary edema: Secondary | ICD-10-CM | POA: Diagnosis present

## 2021-11-21 DIAGNOSIS — Z6826 Body mass index (BMI) 26.0-26.9, adult: Secondary | ICD-10-CM | POA: Diagnosis not present

## 2021-11-21 DIAGNOSIS — R64 Cachexia: Secondary | ICD-10-CM | POA: Diagnosis present

## 2021-11-21 DIAGNOSIS — Z603 Acculturation difficulty: Secondary | ICD-10-CM | POA: Diagnosis present

## 2021-11-21 DIAGNOSIS — I214 Non-ST elevation (NSTEMI) myocardial infarction: Secondary | ICD-10-CM | POA: Diagnosis present

## 2021-11-21 DIAGNOSIS — J189 Pneumonia, unspecified organism: Secondary | ICD-10-CM | POA: Diagnosis present

## 2021-11-21 DIAGNOSIS — I2729 Other secondary pulmonary hypertension: Secondary | ICD-10-CM | POA: Diagnosis present

## 2021-11-21 DIAGNOSIS — I255 Ischemic cardiomyopathy: Secondary | ICD-10-CM | POA: Diagnosis present

## 2021-11-21 DIAGNOSIS — I083 Combined rheumatic disorders of mitral, aortic and tricuspid valves: Secondary | ICD-10-CM | POA: Diagnosis present

## 2021-11-21 DIAGNOSIS — I25110 Atherosclerotic heart disease of native coronary artery with unstable angina pectoris: Secondary | ICD-10-CM | POA: Diagnosis present

## 2021-11-21 DIAGNOSIS — E1122 Type 2 diabetes mellitus with diabetic chronic kidney disease: Secondary | ICD-10-CM | POA: Diagnosis present

## 2021-11-21 DIAGNOSIS — D509 Iron deficiency anemia, unspecified: Secondary | ICD-10-CM | POA: Diagnosis not present

## 2021-11-21 LAB — CBC WITH DIFFERENTIAL/PLATELET
Abs Immature Granulocytes: 0.25 10*3/uL — ABNORMAL HIGH (ref 0.00–0.07)
Basophils Absolute: 0.1 10*3/uL (ref 0.0–0.1)
Basophils Relative: 1 %
Eosinophils Absolute: 0.3 10*3/uL (ref 0.0–0.5)
Eosinophils Relative: 1 %
HCT: 41.1 % (ref 39.0–52.0)
Hemoglobin: 11.1 g/dL — ABNORMAL LOW (ref 13.0–17.0)
Immature Granulocytes: 1 %
Lymphocytes Relative: 28 %
Lymphs Abs: 5.4 10*3/uL — ABNORMAL HIGH (ref 0.7–4.0)
MCH: 21 pg — ABNORMAL LOW (ref 26.0–34.0)
MCHC: 27 g/dL — ABNORMAL LOW (ref 30.0–36.0)
MCV: 77.8 fL — ABNORMAL LOW (ref 80.0–100.0)
Monocytes Absolute: 1.6 10*3/uL — ABNORMAL HIGH (ref 0.1–1.0)
Monocytes Relative: 8 %
Neutro Abs: 11.7 10*3/uL — ABNORMAL HIGH (ref 1.7–7.7)
Neutrophils Relative %: 61 %
Platelets: 765 10*3/uL — ABNORMAL HIGH (ref 150–400)
RBC: 5.28 MIL/uL (ref 4.22–5.81)
RDW: 19.6 % — ABNORMAL HIGH (ref 11.5–15.5)
WBC: 19.4 10*3/uL — ABNORMAL HIGH (ref 4.0–10.5)
nRBC: 0 % (ref 0.0–0.2)

## 2021-11-21 LAB — I-STAT VENOUS BLOOD GAS, ED
Acid-base deficit: 3 mmol/L — ABNORMAL HIGH (ref 0.0–2.0)
Bicarbonate: 26.1 mmol/L (ref 20.0–28.0)
Calcium, Ion: 1.12 mmol/L — ABNORMAL LOW (ref 1.15–1.40)
HCT: 41 % (ref 39.0–52.0)
Hemoglobin: 13.9 g/dL (ref 13.0–17.0)
O2 Saturation: 98 %
Potassium: 4.3 mmol/L (ref 3.5–5.1)
Sodium: 137 mmol/L (ref 135–145)
TCO2: 28 mmol/L (ref 22–32)
pCO2, Ven: 64.4 mmHg — ABNORMAL HIGH (ref 44.0–60.0)
pH, Ven: 7.215 — ABNORMAL LOW (ref 7.250–7.430)
pO2, Ven: 130 mmHg — ABNORMAL HIGH (ref 32.0–45.0)

## 2021-11-21 LAB — RAPID HIV SCREEN (HIV 1/2 AB+AG)
HIV 1/2 Antibodies: NONREACTIVE
HIV-1 P24 Antigen - HIV24: NONREACTIVE

## 2021-11-21 LAB — TROPONIN I (HIGH SENSITIVITY)
Troponin I (High Sensitivity): 121 ng/L (ref <18)
Troponin I (High Sensitivity): 449 ng/L (ref <18)

## 2021-11-21 LAB — I-STAT CHEM 8, ED
BUN: 65 mg/dL — ABNORMAL HIGH (ref 8–23)
Calcium, Ion: 1.1 mmol/L — ABNORMAL LOW (ref 1.15–1.40)
Chloride: 100 mmol/L (ref 98–111)
Creatinine, Ser: 2 mg/dL — ABNORMAL HIGH (ref 0.61–1.24)
Glucose, Bld: 410 mg/dL — ABNORMAL HIGH (ref 70–99)
HCT: 42 % (ref 39.0–52.0)
Hemoglobin: 14.3 g/dL (ref 13.0–17.0)
Potassium: 4.2 mmol/L (ref 3.5–5.1)
Sodium: 136 mmol/L (ref 135–145)
TCO2: 26 mmol/L (ref 22–32)

## 2021-11-21 LAB — BASIC METABOLIC PANEL
Anion gap: 15 (ref 5–15)
BUN: 58 mg/dL — ABNORMAL HIGH (ref 8–23)
CO2: 22 mmol/L (ref 22–32)
Calcium: 8.5 mg/dL — ABNORMAL LOW (ref 8.9–10.3)
Chloride: 97 mmol/L — ABNORMAL LOW (ref 98–111)
Creatinine, Ser: 2.06 mg/dL — ABNORMAL HIGH (ref 0.61–1.24)
GFR, Estimated: 32 mL/min — ABNORMAL LOW (ref >60)
Glucose, Bld: 411 mg/dL — ABNORMAL HIGH (ref 70–99)
Potassium: 4.1 mmol/L (ref 3.5–5.1)
Sodium: 134 mmol/L — ABNORMAL LOW (ref 135–145)

## 2021-11-21 LAB — PROTIME-INR
INR: 1 (ref 0.8–1.2)
Prothrombin Time: 13.5 seconds (ref 11.4–15.2)

## 2021-11-21 MED ORDER — HEPARIN (PORCINE) 25000 UT/250ML-% IV SOLN
1000.0000 [IU]/h | INTRAVENOUS | Status: DC
  Administered 2021-11-21: 900 [IU]/h via INTRAVENOUS
  Administered 2021-11-23 – 2021-11-24 (×2): 1000 [IU]/h via INTRAVENOUS
  Filled 2021-11-21 (×3): qty 250

## 2021-11-21 MED ORDER — NITROGLYCERIN IN D5W 200-5 MCG/ML-% IV SOLN
0.0000 ug/min | INTRAVENOUS | Status: DC
Start: 2021-11-21 — End: 2021-11-24
  Administered 2021-11-21: 10 ug/min via INTRAVENOUS
  Administered 2021-11-24: 12.5 ug/min via INTRAVENOUS
  Filled 2021-11-21: qty 250

## 2021-11-21 MED ORDER — ACETAMINOPHEN 325 MG PO TABS
650.0000 mg | ORAL_TABLET | Freq: Four times a day (QID) | ORAL | Status: DC | PRN
  Administered 2021-11-27: 650 mg via ORAL
  Filled 2021-11-21: qty 2

## 2021-11-21 MED ORDER — DILTIAZEM LOAD VIA INFUSION
10.0000 mg | Freq: Once | INTRAVENOUS | Status: DC
Start: 2021-11-21 — End: 2021-11-21

## 2021-11-21 MED ORDER — DILTIAZEM HCL-DEXTROSE 125-5 MG/125ML-% IV SOLN (PREMIX)
5.0000 mg/h | INTRAVENOUS | Status: DC
Start: 2021-11-21 — End: 2021-11-21

## 2021-11-21 MED ORDER — NITROGLYCERIN IN D5W 200-5 MCG/ML-% IV SOLN
INTRAVENOUS | Status: AC
  Filled 2021-11-21: qty 250

## 2021-11-21 MED ORDER — ASPIRIN 325 MG PO TABS
325.0000 mg | ORAL_TABLET | Freq: Every day | ORAL | Status: DC
  Administered 2021-11-21: 325 mg via ORAL
  Filled 2021-11-21: qty 1

## 2021-11-21 MED ORDER — ACETAMINOPHEN 650 MG RE SUPP: 650.0000 mg | Freq: Four times a day (QID) | RECTAL | Status: DC | PRN

## 2021-11-21 NOTE — Progress Notes (Signed)
ANTICOAGULATION CONSULT NOTE - Initial Consult  Pharmacy Consult for Heparin Indication: atrial fibrillation  Not on File  Patient Measurements:   Heparin Dosing Weight: 56.2 kg  Vital Signs: Temp: 97.6 F (36.4 C) (12/01 2100) Temp Source: Axillary (12/01 2100) BP: 119/60 (12/01 2100) Pulse Rate: 80 (12/01 2100)  Labs: Recent Labs    11/21/21 1945 11/21/21 2006  HGB 11.1* 14.3  13.9  HCT 41.1 42.0  41.0  PLT 765*  --   CREATININE 2.06* 2.00*  TROPONINIHS 121*  --     CrCl cannot be calculated (Unknown ideal weight.).   Medical History: No past medical history on file.  Medications:  (Not in a hospital admission)  Scheduled:   aspirin  325 mg Oral Daily   Infusions:   nitroGLYCERIN 12.5 mcg/min (11/21/21 2000)    Assessment: 1 yom presenting with SOB. Heparin per pharmacy consult placed for atrial fibrillation.  Patient is not on anticoagulation prior to arrival.  Hgb 11.1; plt 765  Goal of Therapy:  Heparin level 0.3-0.7 units/ml Monitor platelets by anticoagulation protocol: Yes   Plan:  No initial bolus Start heparin infusion at 900 units/hr Check anti-Xa level in 8 hours and daily while on heparin Continue to monitor H&H and platelets  Lorelei Pont, PharmD, BCPS 11/21/2021 9:28 PM ED Clinical Pharmacist -  [phone]

## 2021-11-21 NOTE — ED Provider Notes (Signed)
[facility] EMERGENCY DEPARTMENT Provider Note   CSN: [id number] Arrival date & time: 11/21/21  1935     History Chief Complaint  Patient presents with   Shortness of Breath    From [facility] with resp[facility]ress.  Arrived on NRB. Slightly combative    Jahki Noble is a 80 y.o. male.   Shortness of Breath  80 year old male with unknown past medical history presenting to the emergency department with respiratory distress.  The patient presented from a [facility] in the area in respiratory distress via EMS.  Was uncooperative with EMS and could not communicate due to a significant language barrier. On arrival, the patient was on a nonrebreather, tachycardic to the 140s, hypertensive, with bilateral crackles heard on exam.  Concern for flash pulmonary edema.  Patient separately placed on BiPAP.  Level 5 caveat due to patient acuity.  History reviewed. No pertinent past medical history.  Patient Active Problem List   Diagnosis Date Noted   Acute respiratory failure with hypoxia ([place]) 11/21/2021    History reviewed. No pertinent surgical history.     History reviewed. No pertinent family history.     Home Medications Prior to Admission medications   Not on File    Allergies    Patient has no allergy information on record.  Review of Systems   Review of Systems  Unable to perform ROS: Acuity of condition  Respiratory:  Positive for shortness of breath.    Physical Exam Updated Vital Signs BP 134/67   Pulse 73   Temp 97.6 F (36.4 C) (Axillary)   Resp (!) 21   SpO2 100%   Physical Exam Vitals and nursing note reviewed.  Constitutional:      General: He is in acute distress.  HENT:     Head: Normocephalic and atraumatic.  Eyes:     Conjunctiva/sclera: Conjunctivae normal.     Pupils: Pupils are equal, round, and reactive to light.  Cardiovascular:     Rate and Rhythm: Regular rhythm. Tachycardia present.  Pulmonary:      Effort: Tachypnea and respiratory distress present.     Breath sounds: Examination of the right-middle field reveals rales. Examination of the left-middle field reveals rales. Examination of the right-lower field reveals rales. Examination of the left-lower field reveals rales. Rhonchi and rales present.  Abdominal:     General: There is no distension.     Tenderness: There is no guarding.  Musculoskeletal:        General: No deformity or signs of injury.     Cervical back: Neck supple.     Right lower leg: No edema.     Left lower leg: No edema.  Skin:    Findings: No lesion or rash.  Neurological:     General: No focal deficit present.     Mental Status: He is alert. Mental status is at baseline.    ED Results / Procedures / Treatments   Labs (all labs ordered are listed, but only abnormal results are displayed) Labs Reviewed  BASIC METABOLIC PANEL - Abnormal; Notable for the following components:      Result Value   Sodium 134 (*)    Chloride 97 (*)    Glucose, Bld 411 (*)    BUN 58 (*)    Creatinine, Ser 2.06 (*)    Calcium 8.5 (*)    GFR, Estimated 32 (*)    All other components within normal limits  CBC WITH DIFFERENTIAL/PLATELET - Abnormal; Notable  for the following components:   WBC 19.4 (*)    Hemoglobin 11.1 (*)    MCV 77.8 (*)    MCH 21.0 (*)    MCHC 27.0 (*)    RDW 19.6 (*)    Platelets 765 (*)    Neutro Abs 11.7 (*)    Lymphs Abs 5.4 (*)    Monocytes Absolute 1.6 (*)    Abs Immature Granulocytes 0.25 (*)    All other components within normal limits  I-STAT CHEM 8, ED - Abnormal; Notable for the following components:   BUN 65 (*)    Creatinine, Ser 2.00 (*)    Glucose, Bld 410 (*)    Calcium, Ion 1.10 (*)    All other components within normal limits  I-STAT VENOUS BLOOD GAS, ED - Abnormal; Notable for the following components:   pH, Ven 7.215 (*)    pCO2, Ven 64.4 (*)    pO2, Ven 130.0 (*)    Acid-base deficit 3.0 (*)    Calcium, Ion 1.12 (*)    All  other components within normal limits  TROPONIN I (HIGH SENSITIVITY) - Abnormal; Notable for the following components:   Troponin I (High Sensitivity) 121 (*)    All other components within normal limits  TROPONIN I (HIGH SENSITIVITY) - Abnormal; Notable for the following components:   Troponin I (High Sensitivity) 449 (*)    All other components within normal limits  RESP PANEL BY RT-PCR (FLU A&B, COVID) ARPGX2  RAPID HIV SCREEN (HIV 1/2 AB+AG)  PROTIME-INR  BLOOD GAS, VENOUS  BRAIN NATRIURETIC PEPTIDE  HEPARIN LEVEL (UNFRACTIONATED)  MAGNESIUM  MAGNESIUM  COMPREHENSIVE METABOLIC PANEL  CBC WITH DIFFERENTIAL/PLATELET  PHOSPHORUS  I-STAT VENOUS BLOOD GAS, ED    EKG EKG Interpretation  Date/Time:  Thursday November 21 2021 21:11:30 EST Ventricular Rate:  80 PR Interval:  232 QRS Duration: 112 QT Interval:  411 QTC Calculation: 475 R Axis:   79 Text Interpretation: Sinus rhythm Prolonged PR interval Borderline intraventricular conduction delay Repol abnrm suggests ischemia, diffuse leads Confirmed by Regan Lemming ([id number]) on 11/21/2021 10:17:06 PM  Radiology DG Chest Port 1 View  Result Date: 11/21/2021 CLINICAL DATA:  Respiratory distress EXAM: PORTABLE CHEST 1 VIEW COMPARISON:  None. FINDINGS: Hazy bilateral opacities, likely pulmonary edema. No pneumothorax or sizable pleural effusion. Cardiomediastinal contours are normal. IMPRESSION: Bilateral hazy opacities, likely pulmonary edema. Electronically Signed   By: Ulyses Jarred M.D.   On: 11/21/2021 20:10    Procedures .Critical Care Performed by: Regan Lemming, MD Authorized by: Regan Lemming, MD   Critical care provider statement:    Critical care time (minutes):  42   Critical care was necessary to treat or prevent imminent or life-threatening deterioration of the following conditions:  Respiratory failure   Critical care was time spent personally by me on the following activities:  Evaluation of patient's response to  treatment, examination of patient, obtaining history from patient or surrogate, ordering and performing treatments and interventions, ordering and review of laboratory studies, ordering and review of radiographic studies, pulse oximetry and re-evaluation of patient's condition   Care discussed with: admitting provider     Medications Ordered in ED Medications  nitroGLYCERIN 50 mg in dextrose 5 % 250 mL (0.2 mg/mL) infusion (12.5 mcg/min Intravenous Rate/Dose Change 11/21/21 2000)  aspirin tablet 325 mg (325 mg Oral Given 11/21/21 2207)  heparin ADULT infusion 100 units/mL (25000 units/261mL) (900 Units/hr Intravenous New Bag/Given 11/21/21 2216)  acetaminophen (TYLENOL) tablet 650 mg (has no administration  in time range)    Or  acetaminophen (TYLENOL) suppository 650 mg (has no administration in time range)    ED Course  I have reviewed the triage vital signs and the nursing notes.  Pertinent labs & imaging results that were available during my care of the patient were reviewed by me and considered in my medical decision making (see chart for details).    MDM Rules/Calculators/A&P                            80 year old male with unknown past medical history presenting to the emergency department with respiratory distress.  The patient presented from a [facility] in the area in respiratory distress via EMS.  Was uncooperative with EMS and could not communicate due to a significant language barrier. On arrival, the patient was on a nonrebreather, tachycardic to the 140s, hypertensive, with bilateral crackles heard on exam.  Concern for flash pulmonary edema.  Patient separately placed on BiPAP.   Patient's EKG on arrival was concerning for atrial fibrillation with rapid ventricular response, no clear ST EMI, and some nonspecific ST segment changes present.  Patient's tachycardia improved rapidly following placement on BiPAP.  He was started on a nitro drip due to concern for flash pulmonary  edema.  Differential diagnosis includes flash pulmonary edema from hypertensive crisis, atrial fibrillation with RVR resulting in pulmonary edema, CHF/volume overload (although patient had no JVD and no bilateral lower extremity edema), ACS.   Repeat EKG following resolution of atrial fibrillation revealed normal sinus rhythm, ventricular rate 80, nonspecific ST segment changes present.  Chest x-ray revealed hazy bilateral opacities, likely pulmonary edema, pneumonia with fax or pleural effusion. Initial BMP was concerning for an elevated serum creatinine with a creatinine of 2.06, BUN of 5 8, blood glucose hyperglycemic to 411 with out an anion gap acidosis.  CBC with a leukocytosis to 9.4, anemia to 11.1, VBG acidotic to 7.21, HCO3 26, PCO2 64.  No baseline labs were available for comparison.  A repeat troponin was pending.  Patient's blood pressure rapidly normalized following BiPAP and placement on a nitro gtt.  His initial troponin was found to be elevated to the low 100s and he was administered aspirin 325 mg.  Empirically started on a heparin gtt. as I am unable to obtain history to include unable to communicate with the patient to determine if he is actively experiencing chest pain.  Suspect potential NSTEMI versus demand ischemia in the patient's initial presentation of atrial fibrillation with RVR.  No family members were bedside to provide further information and the patient was unable to communicate the language he spoke in order to provide accurate interpreter services.  On reassessment, the patient remained stable on BiPAP and a nitro GGT.  Hospitalist medicine was consulted for admission for further care and management.    Final Clinical Impression(s) / ED Diagnoses Final diagnoses:  Flash pulmonary edema (HCC)  NSTEMI (non-ST elevated myocardial infarction) (HCC)  Atrial fibrillation with RVR [facility])    Rx / DC Orders ED Discharge Orders     None        Regan Lemming,  MD 11/22/21 [phone]

## 2021-11-21 NOTE — H&P (Signed)
History and Physical    PLEASE NOTE THAT DRAGON DICTATION SOFTWARE WAS USED IN THE CONSTRUCTION OF THIS NOTE.   Randy [name] XAJ:[id number] DOB: [birth year] DOA: 11/21/2021  PCP: No primary care provider on file. (Will further assess) Patient coming from: home   I have personally briefly reviewed patient's old medical records in [place]  Chief Complaint: sob  HPI: Randy [name] is a 80 y.o. male with unclear medical history who is admitted to [facility] on 11/21/2021 with acute hypoxic hypercapnic respiratory failure after presenting to [facility] ED via ems for eval of sob.   [facility] for further evaluation of shortness of breath.  There is currently very little information known about his past medical history, as English does not appear to be his primary language, and it has been unclear as to which language he is speaking.  Attempts to contact the patient's daughter for additional insight into the language that the patient speaking as well as to evaluate his past medical history of been unsuccessful.      ED Course:  Vital signs in the ED were notable for the following: Temperature max 97.6; initial heart rates 10 6-1 07; initial blood pressure 170/81, which is decreased to 134/67 following initiation of BiPAP.  Initial respiratory rate 26-29, which is improved to 15-18 following BiPAP; initial oxygen saturation 87% on room air, which is improved to 100% on BiPAP with settings of 12/6 and 45% FiO2.   Labs were notable for the following: VBG showed 7.215/64.  CMP notable for the following: Sodium 134, bicarbonate 22, BUN 58, creatinine 2.06, with no prior serum creatinine data point currently available for point comparison.  High-sensitivity troponin I 121, without any prior troponin data point available for point comparison, glucose 411.  CBC notable for cell count 19,000 with 61% neutrophils, hemoglobin 11.  INR 1.0.   COVID-19/influenza PCR were checked in the ED today, with results currently pending.  Imaging and additional notable ED work-up: EKG shows sinus rhythm with prolonged PR interval, heart rate 80, T wave inversion in V2 through 5, and no evidence of ST changes, including no evidence of ST elevation, all in the context of no prior EKG available for point comparison.  Chest x-ray was suggestive of pulmonary edema in the absence of overt infiltrate, pleural effusion, or pneumothorax.  While in the ED, the following were administered: Initiation of BiPAP, as above, heparin drip, nitroglycerin drip, and full dose aspirin x1.  Subsequently, the patient was admitted to the PCU for further evaluation management of acute hypoxic hypercapnic respiratory failure in the setting of suspected flash pulmonary edema.     Review of Systems: As per HPI otherwise 10 point review of systems negative.   History reviewed. No pertinent past medical history.  History reviewed. No pertinent surgical history.  Social History:  reports that he has never smoked. He has never used smokeless tobacco. He reports that he does not drink alcohol and does not use drugs.  Family history reviewed and not pertinent    Home medications: currently unclear  Objective    Physical Exam: Vitals:   11/21/21 2030 11/21/21 2100 11/21/21 2145 11/21/21 2215  BP: (!) 142/63 119/60 (!) 117/59 112/61  Pulse: 92 80 73 76  Resp: (!) 26 18 18  (!) 27  Temp:  97.6 F (36.4 C)    TempSrc:  Axillary    SpO2: 100% 100% 100% 100%    General: appears  to be stated age; alert, oriented Skin: warm, dry, no rash Head:  AT/[place] Mouth:  Oral mucosa membranes appear moist, normal dentition Neck: supple; trachea midline Heart:  RRR; did not appreciate any M/R/G Lungs: CTAB, did not appreciate any wheezes, rales, or rhonchi Abdomen: + BS; soft, ND, NT Vascular: 2+ pedal pulses b/l; 2+ radial pulses b/l Extremities: no peripheral edema, no  muscle wasting Neuro: strength and sensation intact in upper and lower extremities b/l    Labs on Admission: I have personally reviewed following labs and imaging studies  CBC: Recent Labs  Lab 11/21/21 1945 11/21/21 2006  WBC 19.4*  --   NEUTROABS 11.7*  --   HGB 11.1* 14.3  13.9  HCT 41.1 42.0  41.0  MCV 77.8*  --   PLT 765*  --    Basic Metabolic Panel: Recent Labs  Lab 11/21/21 1945 11/21/21 2006  NA 134* 136  137  K 4.1 4.2  4.3  CL 97* 100  CO2 22  --   GLUCOSE 411* 410*  BUN 58* 65*  CREATININE 2.06* 2.00*  CALCIUM 8.5*  --    GFR: CrCl cannot be calculated (Unknown ideal weight.). Liver Function Tests: No results for input(s): AST, ALT, ALKPHOS, BILITOT, PROT, ALBUMIN in the last 168 hours. No results for input(s): LIPASE, AMYLASE in the last 168 hours. No results for input(s): AMMONIA in the last 168 hours. Coagulation Profile: No results for input(s): INR, PROTIME in the last 168 hours. Cardiac Enzymes: No results for input(s): CKTOTAL, CKMB, CKMBINDEX, TROPONINI in the last 168 hours. BNP (last 3 results) No results for input(s): PROBNP in the last 8760 hours. HbA1C: No results for input(s): HGBA1C in the last 72 hours. CBG: No results for input(s): GLUCAP in the last 168 hours. Lipid Profile: No results for input(s): CHOL, HDL, LDLCALC, TRIG, CHOLHDL, LDLDIRECT in the last 72 hours. Thyroid Function Tests: No results for input(s): TSH, T4TOTAL, FREET4, T3FREE, THYROIDAB in the last 72 hours. Anemia Panel: No results for input(s): VITAMINB12, FOLATE, FERRITIN, TIBC, IRON, RETICCTPCT in the last 72 hours. Urine analysis: No results found for: COLORURINE, APPEARANCEUR, LABSPEC, [place], GLUCOSEU, HGBUR, BILIRUBINUR, KETONESUR, PROTEINUR, UROBILINOGEN, NITRITE, LEUKOCYTESUR  Radiological Exams on Admission: DG Chest Port 1 View  Result Date: 11/21/2021 CLINICAL DATA:  Respiratory distress EXAM: PORTABLE CHEST 1 VIEW COMPARISON:  None. FINDINGS:  Hazy bilateral opacities, likely pulmonary edema. No pneumothorax or sizable pleural effusion. Cardiomediastinal contours are normal. IMPRESSION: Bilateral hazy opacities, likely pulmonary edema. Electronically Signed   By: Ulyses Jarred M.D.   On: 11/21/2021 20:10     EKG: Independently reviewed, with result as described above.    Assessment/Plan   Principal Problem:   Acute respiratory failure with hypoxia and hypercapnia (HCC) Active Problems:   SOB (shortness of breath)   Elevated troponin   Hyperglycemia   Blood creatinine increased compared with prior measurement    #) Acute hypoxic hypercapnic respiratory failure due to suspected flash pulmonary edema: In the absence of any known baseline supplemental oxygen requirements, the patient presents respiratory distress, with increased work of breathing, with initial oxygen saturations of 87% on room air, subsequently improving to 100% on BiPAP, with the above settings, with initial VBG suggestive of respiratory acidosis.  In the smoking history versus history of obstructive lung disease is currently unclear.  Suspect contribution from flash pulmonary edema given chest x-ray demonstrating evidence of pulmonary edema in the setting of significantly elevated blood pressure, as above, that has improved with nitroglycerin  drip, as above.  Unclear etiology leading to flash pulmonary edema.  will further evaluate for ACS, as further detailed below, and check echocardiogram in the morning.  No evidence of overt infiltrate on presenting chest x-ray, and COVID-19/influenza PCR results are currently pending.  Plan: Continue BiPAP.  Repeat ABG.  Add on procalcitonin.  Follow for results of COVID-19/influenza PCR.  Monitor continuous pulse oximetry.  Monitor on telemetry.  Monitor strict I's and O's Daily weights.  Echocardiogram ordered for the morning.  CMP and CBC in the morning.  Check serum magnesium and phosphorus levels.  Trend serial  troponin.     #) Elevated troponin: Initial troponin found to be 121, without any prior data point available for point comparison.  Suspect that this elevation is on the basis of type II supply demand mismatch in setting of presenting acute hypoxic respiratory failure as a consequence of flash pulmonary edema, as opposed to ACS leading to acute pulmonary edema given significant provement in the patient's respiratory status and symptoms following initiation of BiPAP.  EKG with nonspecific T wave findings in the absence of any prior EKG for point comparison, demonstrating no evidence of ST changes, including no evidence of ST elevation.  Heparin drip initiated in the ED.  Status post full dose aspirin administered in the ED.  Plan: Continue heparin drip.  Continue to monitor on telemetry.  Trend still troponin.  Echocardiogram ordered for the morning.  Further evaluation management of acute hypoxic respiratory failure due to suspected flash pulmonary edema, as above.  Add on serum magnesium level.      #) Hyperglycemia: Presenting serum glucose 411, without corresponding anion gap metabolic acidosis to suggest DKA.  Unclear if the patient has a history of diabetes at this time versus elevated glucose value on the basis of stress-induced hyperglycemia in the setting of primary/pulmonary edema.   Plan: Accu-Cheks before every meal and at bedtime with low-dose sliding scale insulin.  Check hemoglobin A1c.      #) Elevated creatinine: Presenting serum creatinine found to be 2.06.  Unclear baseline in the absence of any prior creatinine data points available.  Differential includes acute kidney injury on the basis of prerenal influences, particularly diminished renal perfusion as a consequence of presenting acute hypoxic respiratory failure.   Plan: Check urinalysis with microscopy.  Check random urine sodium as well as random urine creatinine.  Monitor strict I's and O's and daily weights.  Repeat  CMP in the morning.      DVT prophylaxis: Heparin drip Code Status: Full code(presumed for now) Family Communication: none; attempts to reach daughter via provided phone number have been unsuccessful.  Disposition Plan: Per Rounding Team Consults called: none;  Admission status: inpt; pcu  Warrants inpatient status on basis of need for further evaluation and management of acute hypoxic respiratory failure due to suspected flash pulmonary edema, including ongoing noninvasive mechanical ventilation need for further blood gas monitoring, as well as pursuit of echocardiogram and further evaluation of elevated troponin, including initial medical management that includes heparin drip.   PLEASE NOTE THAT DRAGON DICTATION SOFTWARE WAS USED IN THE CONSTRUCTION OF THIS NOTE.   [place] DO [facility]  From [place]   11/21/2021, 10:42 PM

## 2021-11-21 NOTE — ED Triage Notes (Signed)
From [facility] with resp. Distress.  Arrived on NRB. Slightly combative

## 2021-11-22 ENCOUNTER — Inpatient Hospital Stay (HOSPITAL_COMMUNITY): Payer: Medicare Other

## 2021-11-22 ENCOUNTER — Encounter (HOSPITAL_COMMUNITY): Payer: Self-pay | Admitting: Internal Medicine

## 2021-11-22 DIAGNOSIS — J810 Acute pulmonary edema: Secondary | ICD-10-CM | POA: Diagnosis not present

## 2021-11-22 DIAGNOSIS — R739 Hyperglycemia, unspecified: Secondary | ICD-10-CM | POA: Diagnosis present

## 2021-11-22 DIAGNOSIS — R778 Other specified abnormalities of plasma proteins: Secondary | ICD-10-CM | POA: Diagnosis not present

## 2021-11-22 DIAGNOSIS — I255 Ischemic cardiomyopathy: Secondary | ICD-10-CM | POA: Diagnosis not present

## 2021-11-22 DIAGNOSIS — I214 Non-ST elevation (NSTEMI) myocardial infarction: Secondary | ICD-10-CM

## 2021-11-22 DIAGNOSIS — R7989 Other specified abnormal findings of blood chemistry: Secondary | ICD-10-CM | POA: Diagnosis present

## 2021-11-22 DIAGNOSIS — J9601 Acute respiratory failure with hypoxia: Secondary | ICD-10-CM | POA: Diagnosis not present

## 2021-11-22 DIAGNOSIS — I5021 Acute systolic (congestive) heart failure: Secondary | ICD-10-CM

## 2021-11-22 DIAGNOSIS — R0602 Shortness of breath: Secondary | ICD-10-CM | POA: Diagnosis present

## 2021-11-22 LAB — URINALYSIS, COMPLETE (UACMP) WITH MICROSCOPIC
Bacteria, UA: NONE SEEN
Bilirubin Urine: NEGATIVE
Glucose, UA: 250 mg/dL — AB
Hgb urine dipstick: NEGATIVE
Ketones, ur: NEGATIVE mg/dL
Leukocytes,Ua: NEGATIVE
Nitrite: NEGATIVE
Protein, ur: 30 mg/dL — AB
Specific Gravity, Urine: 1.025 (ref 1.005–1.030)
pH: 5.5 (ref 5.0–8.0)

## 2021-11-22 LAB — I-STAT VENOUS BLOOD GAS, ED
Acid-Base Excess: 2 mmol/L (ref 0.0–2.0)
Bicarbonate: 25 mmol/L (ref 20.0–28.0)
Calcium, Ion: 0.96 mmol/L — ABNORMAL LOW (ref 1.15–1.40)
HCT: 34 % — ABNORMAL LOW (ref 39.0–52.0)
Hemoglobin: 11.6 g/dL — ABNORMAL LOW (ref 13.0–17.0)
O2 Saturation: 86 %
Potassium: 4.3 mmol/L (ref 3.5–5.1)
Sodium: 138 mmol/L (ref 135–145)
TCO2: 26 mmol/L (ref 22–32)
pCO2, Ven: 32.5 mmHg — ABNORMAL LOW (ref 44.0–60.0)
pH, Ven: 7.494 — ABNORMAL HIGH (ref 7.250–7.430)
pO2, Ven: 46 mmHg — ABNORMAL HIGH (ref 32.0–45.0)

## 2021-11-22 LAB — COMPREHENSIVE METABOLIC PANEL
ALT: 48 U/L — ABNORMAL HIGH (ref 0–44)
AST: 46 U/L — ABNORMAL HIGH (ref 15–41)
Albumin: 3 g/dL — ABNORMAL LOW (ref 3.5–5.0)
Alkaline Phosphatase: 88 U/L (ref 38–126)
Anion gap: 9 (ref 5–15)
BUN: 57 mg/dL — ABNORMAL HIGH (ref 8–23)
CO2: 24 mmol/L (ref 22–32)
Calcium: 8.5 mg/dL — ABNORMAL LOW (ref 8.9–10.3)
Chloride: 106 mmol/L (ref 98–111)
Creatinine, Ser: 1.68 mg/dL — ABNORMAL HIGH (ref 0.61–1.24)
GFR, Estimated: 40 mL/min — ABNORMAL LOW (ref >60)
Glucose, Bld: 139 mg/dL — ABNORMAL HIGH (ref 70–99)
Potassium: 4.3 mmol/L (ref 3.5–5.1)
Sodium: 139 mmol/L (ref 135–145)
Total Bilirubin: 0.5 mg/dL (ref 0.3–1.2)
Total Protein: 6.6 g/dL (ref 6.5–8.1)

## 2021-11-22 LAB — ECHOCARDIOGRAM COMPLETE
Area-P 1/2: 4.63 cm2
Calc EF: 31.9 %
S' Lateral: 4.2 cm
Single Plane A2C EF: 32.9 %
Single Plane A4C EF: 33.3 %

## 2021-11-22 LAB — CBC WITH DIFFERENTIAL/PLATELET
Abs Immature Granulocytes: 0.11 10*3/uL — ABNORMAL HIGH (ref 0.00–0.07)
Basophils Absolute: 0.1 10*3/uL (ref 0.0–0.1)
Basophils Relative: 0 %
Eosinophils Absolute: 0.1 10*3/uL (ref 0.0–0.5)
Eosinophils Relative: 1 %
HCT: 36.8 % — ABNORMAL LOW (ref 39.0–52.0)
Hemoglobin: 10.3 g/dL — ABNORMAL LOW (ref 13.0–17.0)
Immature Granulocytes: 1 %
Lymphocytes Relative: 8 %
Lymphs Abs: 1.4 10*3/uL (ref 0.7–4.0)
MCH: 21.4 pg — ABNORMAL LOW (ref 26.0–34.0)
MCHC: 28 g/dL — ABNORMAL LOW (ref 30.0–36.0)
MCV: 76.3 fL — ABNORMAL LOW (ref 80.0–100.0)
Monocytes Absolute: 1.9 10*3/uL — ABNORMAL HIGH (ref 0.1–1.0)
Monocytes Relative: 11 %
Neutro Abs: 14.2 10*3/uL — ABNORMAL HIGH (ref 1.7–7.7)
Neutrophils Relative %: 79 %
Platelets: 616 10*3/uL — ABNORMAL HIGH (ref 150–400)
RBC: 4.82 MIL/uL (ref 4.22–5.81)
RDW: 19.6 % — ABNORMAL HIGH (ref 11.5–15.5)
WBC: 17.8 10*3/uL — ABNORMAL HIGH (ref 4.0–10.5)
nRBC: 0 % (ref 0.0–0.2)

## 2021-11-22 LAB — HEMOGLOBIN A1C
Hgb A1c MFr Bld: 7.2 % — ABNORMAL HIGH (ref 4.8–5.6)
Mean Plasma Glucose: 159.94 mg/dL

## 2021-11-22 LAB — CREATININE, URINE, RANDOM: Creatinine, Urine: 86.67 mg/dL

## 2021-11-22 LAB — HEPARIN LEVEL (UNFRACTIONATED)
Heparin Unfractionated: 0.27 IU/mL — ABNORMAL LOW (ref 0.30–0.70)
Heparin Unfractionated: 0.54 IU/mL (ref 0.30–0.70)

## 2021-11-22 LAB — CBG MONITORING, ED
Glucose-Capillary: 119 mg/dL — ABNORMAL HIGH (ref 70–99)
Glucose-Capillary: 220 mg/dL — ABNORMAL HIGH (ref 70–99)

## 2021-11-22 LAB — PROCALCITONIN: Procalcitonin: 0.16 ng/mL

## 2021-11-22 LAB — BRAIN NATRIURETIC PEPTIDE: B Natriuretic Peptide: 2954.8 pg/mL — ABNORMAL HIGH (ref 0.0–100.0)

## 2021-11-22 LAB — GLUCOSE, CAPILLARY
Glucose-Capillary: 215 mg/dL — ABNORMAL HIGH (ref 70–99)
Glucose-Capillary: 198 mg/dL — ABNORMAL HIGH (ref 70–99)

## 2021-11-22 LAB — TROPONIN I (HIGH SENSITIVITY): Troponin I (High Sensitivity): 1527 ng/L (ref <18)

## 2021-11-22 LAB — SODIUM, URINE, RANDOM: Sodium, Ur: 47 mmol/L

## 2021-11-22 LAB — MAGNESIUM: Magnesium: 2.2 mg/dL (ref 1.7–2.4)

## 2021-11-22 LAB — PHOSPHORUS: Phosphorus: 4.4 mg/dL (ref 2.5–4.6)

## 2021-11-22 MED ORDER — CLOPIDOGREL BISULFATE 75 MG PO TABS
75.0000 mg | ORAL_TABLET | Freq: Every day | ORAL | Status: DC
  Administered 2021-11-23 – 2021-11-28 (×6): 75 mg via ORAL
  Filled 2021-11-22 (×6): qty 1

## 2021-11-22 MED ORDER — CLOPIDOGREL BISULFATE 300 MG PO TABS
300.0000 mg | ORAL_TABLET | Freq: Every day | ORAL | Status: DC
  Administered 2021-11-22: 300 mg via ORAL
  Filled 2021-11-22: qty 1

## 2021-11-22 MED ORDER — FUROSEMIDE 10 MG/ML IJ SOLN
40.0000 mg | Freq: Once | INTRAMUSCULAR | Status: AC
  Administered 2021-11-22: 40 mg via INTRAVENOUS
  Filled 2021-11-22: qty 4

## 2021-11-22 MED ORDER — INSULIN ASPART 100 UNIT/ML IJ SOLN
0.0000 [IU] | Freq: Three times a day (TID) | INTRAMUSCULAR | Status: DC
  Administered 2021-11-22: 2 [IU] via SUBCUTANEOUS
  Administered 2021-11-23: 1 [IU] via SUBCUTANEOUS
  Administered 2021-11-23: 2 [IU] via SUBCUTANEOUS
  Administered 2021-11-24 – 2021-11-27 (×4): 1 [IU] via SUBCUTANEOUS

## 2021-11-22 MED ORDER — ATORVASTATIN CALCIUM 80 MG PO TABS
80.0000 mg | ORAL_TABLET | Freq: Every day | ORAL | Status: DC
  Administered 2021-11-22 – 2021-11-28 (×7): 80 mg via ORAL
  Filled 2021-11-22 (×7): qty 1

## 2021-11-22 MED ORDER — SODIUM CHLORIDE 0.9 % IV SOLN
2.0000 g | INTRAVENOUS | Status: AC
  Administered 2021-11-22 – 2021-11-26 (×5): 2 g via INTRAVENOUS
  Filled 2021-11-22 (×5): qty 20

## 2021-11-22 MED ORDER — CARVEDILOL 3.125 MG PO TABS
3.1250 mg | ORAL_TABLET | Freq: Two times a day (BID) | ORAL | Status: DC
  Administered 2021-11-22 – 2021-11-28 (×10): 3.125 mg via ORAL
  Filled 2021-11-22 (×11): qty 1

## 2021-11-22 MED ORDER — ASPIRIN EC 81 MG PO TBEC
81.0000 mg | DELAYED_RELEASE_TABLET | Freq: Every day | ORAL | Status: DC
  Administered 2021-11-22 – 2021-11-28 (×7): 81 mg via ORAL
  Filled 2021-11-22 (×7): qty 1

## 2021-11-22 MED ORDER — SODIUM CHLORIDE 0.9 % IV SOLN
500.0000 mg | INTRAVENOUS | Status: AC
  Administered 2021-11-22 – 2021-11-26 (×4): 500 mg via INTRAVENOUS
  Filled 2021-11-22 (×5): qty 500

## 2021-11-22 MED ORDER — CLOPIDOGREL BISULFATE 300 MG PO TABS: 300.0000 mg | ORAL_TABLET | Freq: Once | ORAL | Status: AC

## 2021-11-22 NOTE — Consult Note (Addendum)
Cardiology Consultation:   Patient ID: Jaryan Chicoine MRN: [id number]; DOB: 01-16-1941  Admit date: 11/21/2021 Date of Consult: 11/22/2021  PCP:  Jilda Panda, MD   [facility] Providers Cardiologist:  Donato Heinz, MD        Patient Profile:   Kalmen Lollar is a 80 y.o. 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 Darrick Meigs.  Admitted 06/2021 for acute CHF exacerbation/pulmonary edema. Echo showed EF of 30-35%.  Mod. LVH, G1DD. Trivial MR, trivial AR. No significant change. Diuresed above 3L. Reduced BB dose due to bradycardia.   Patient did not showed up for hospital follow up.   Most recently admitted 11/25-11/30 for CHF exacerbation and urinary retention with UTI. Foley was placed.  Echo showed EF further down to 25% with WM abnormality.  Per discharge note "Suspect renal function mildly elevated secondary to overdiuresis therefore will resume home Lasix on discharge". Discharge Scr 2.07.   History of Present Illness:   [facility] for [facility] distress. EMS placed on NRB. Communication was difficult due to language barrier. In ER he was placed on BiPAP with improved oxygenation. Chest X-ray with pulmonary edema. Troponin elevated. Started on heparin drip. Also on nitro drip. BP was 169/81 on arrival.   Hs-troponin  121>>449>>1527 BNP 2954 SCr 1.68 Hgb 11.6 HgbA1c 7.2  Above history obtained from reviewing chart.  At [facility], I was able to get [place] interpreter on phone.  Patient was on BiPAP and temporary placed to nonrebreather.  Very difficult conversation.  Nurse was at bedside.  Patient reports sudden onset chest pain and shortness of breath while  at [facility] thus EMS was called.  He felt better after discharge.   He reports his daughter is a Engineer, drilling at [place], [place], however per prior cath report she is a cardiac nurse.   History reviewed. No pertinent past medical history.  History reviewed. No pertinent surgical history.  Reviewed prior history from another chart.  Inpatient Medications: Scheduled Meds:  aspirin EC  81 mg Oral Daily   atorvastatin  80 mg Oral Daily   carvedilol  3.125 mg Oral BID WC   clopidogrel  300 mg Oral Daily   [START ON 11/23/2021] clopidogrel  75 mg Oral Daily   furosemide  40 mg Intravenous Once   insulin aspart  0-6 Units Subcutaneous TID WC   Continuous Infusions:  heparin 1,000 Units/hr (11/22/21 0753)   nitroGLYCERIN 12.5 mcg/min (11/22/21 0753)   PRN Meds: acetaminophen **OR** acetaminophen  Allergies:   No Known Allergies  Social History:   Social History   Socioeconomic History   Marital status: Married    Spouse name: Not on file   Number of children: Not on file   Years of education: Not on file   Highest education level: Not on file  Occupational History   Not on file  Tobacco Use   Smoking status: Never   Smokeless tobacco: Never  Substance and Sexual Activity   Alcohol use: Never   Drug use: Never   Sexual activity: Not on file  Other Topics Concern   Not on file  Social History Narrative   Not on file   Social  Determinants of Health   Financial Resource Strain: Not on file  Food Insecurity: Not on file  Transportation Needs: Not on file  Physical Activity: Not on file  Stress: Not on file  Social Connections: Not on file  Intimate Partner Violence: Not on file    Family History:   History reviewed. No pertinent family history.  Reviewed prior chart  ROS:  Please see the history of present illness.  All other ROS reviewed and negative.     Physical Exam/Data:   Vitals:   11/22/21 0930 11/22/21 1100 11/22/21 1230 11/22/21 1400  BP: (!)  108/56 (!) 142/65 (!) 149/73 (!) 144/69  Pulse:  79 (!) 111   Resp: (!) 30 (!) 23 (!) 28   Temp:      TempSrc:      SpO2:  100% 94%     Intake/Output Summary (Last 24 hours) at 11/22/2021 1440 Last data filed at 11/22/2021 0753 Gross per 24 hour  Intake 130.75 ml  Output --  Net 130.75 ml   No flowsheet data found.   There is no height or weight on file to calculate BMI.  General: Ill-appearing male on BiPAP  HEENT: normal Neck: no JVD Vascular: No carotid bruits; Distal pulses 2+ bilaterally Cardiac:  normal S1, S2; RRR; no murmur  Lungs:  clear to auscultation bilaterally, no wheezing, rhonchi or rales  Abd: soft, nontender, no hepatomegaly  Ext: no edema Musculoskeletal:  No deformities, BUE and BLE strength normal and equal Skin: warm and dry  Neuro:  CNs 2-12 intact, no focal abnormalities noted Psych:  Normal affect   EKG:  The EKG was personally reviewed and demonstrates: Sinus rhythm with inferior lateral T wave inversion. Deep T wave inversion in lead V3 to V4 seems new Telemetry:  Telemetry was personally reviewed and demonstrates:  Sinus tachycardia   Relevant CV Studies:  Echo 11/16/21 1. Left ventricular ejection fraction, by estimation, is 25%. The left  ventricle has severely decreased function. The left ventricle demonstrates  regional wall motion abnormalities (see scoring diagram/findings for  description). There is mild left  ventricular hypertrophy. Left ventricular diastolic parameters are  indeterminate.   2. Right ventricular systolic function is mildly reduced. The right  ventricular size is normal. There is mildly elevated pulmonary artery  systolic pressure. The estimated right ventricular systolic pressure is  40.9 mmHg.   3. Left atrial size was severely dilated.   4. Right atrial size was moderately dilated.   5. The mitral valve is grossly normal. Moderate to severe mitral valve  regurgitation. Systolic flow reversals noted in the right  inferior  pulmonary vein. MR mechanism likely secondary to LV dysfunction, leaflet  tenting noted. No evidence of mitral  stenosis.   6. The aortic valve is grossly normal. There is mild calcification of the  aortic valve. There is mild thickening of the aortic valve. Aortic valve  regurgitation is mild. Aortic valve sclerosis/calcification is present,  without any evidence of aortic  stenosis.   7. The inferior vena cava is dilated in size with >50% respiratory  variability, suggesting right atrial pressure of 8 mmHg.   Cath 01/2021 IABP Insertion  RIGHT/LEFT HEART CATH AND CORONARY ANGIOGRAPHY   Conclusion    LV end diastolic pressure is moderately elevated. There is no aortic valve stenosis. Hemodynamic findings consistent with mild pulmonary hypertension. With mildly elevated LVEDP. Normal cardiac output and index by Fick ------------------------- Prox RCA to Mid RCA lesion is 50% stenosed. Dist RCA lesion  is 99% (subtotal occlusion) stenosed with 90% stenosed side Dalaina Tates in RPAV. Distal RPL fills via left-to-right collaterals RPDA lesion is 100% stenosed. PDA fills via septal collaterals Ostial LM 50% stenosis. Dist LM to Ost LAD lesion is 60% stenosed with 80% stenosed side Myha Arizpe in Ost Cx. Mid LAD lesion is 90% stenosed with 40% stenosed side Milanie Rosenfield in 2nd Diag.   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  CVTS consulted. Per request, we will check a noncontrasted chest CT Dr. Gardiner Rhyme  has agreed to discussed the case with the patient's daughter who is a cardiac nurse.  With the language barrier this is very difficult discussion having he is extremely scared.  At this point I think his only real option is bypass surgery. IV heparin was started per pharmacy protocol for IABP.   He was hemodynamically stable, chest pain-free and breathing well leave the Cath Lab.   Diagnostic Dominance: Right   Laboratory Data:  High Sensitivity Troponin:   Recent Labs  Lab 11/21/21 1945 11/21/21 2215 11/22/21 0600  TROPONINIHS 121* 449* 1,527*     Chemistry Recent Labs  Lab 11/21/21 1945 11/21/21 2006 11/22/21 0600 11/22/21 0628  NA 134* 136  137 139 138  K 4.1 4.2  4.3 4.3 4.3  CL 97* 100 106  --   CO2 22  --  24  --   GLUCOSE 411* 410* 139*  --   BUN 58* 65* 57*  --   CREATININE 2.06* 2.00* 1.68*  --   CALCIUM 8.5*  --  8.5*  --   MG  --   --  2.2  --   GFRNONAA 32*  --  40*  --   ANIONGAP 15  --  9  --     Recent Labs  Lab 11/22/21 0600  PROT 6.6  ALBUMIN 3.0*  AST 46*  ALT 48*  ALKPHOS 88  BILITOT 0.5   Hematology Recent Labs  Lab 11/21/21 1945 11/21/21 2006 11/22/21 0600 11/22/21 0628  WBC 19.4*  --  17.8*  --   RBC 5.28  --  4.82  --   HGB 11.1* 14.3  13.9 10.3* 11.6*  HCT 41.1 42.0  41.0 36.8* 34.0*  MCV 77.8*  --  76.3*  --   MCH 21.0*  --  21.4*  --   MCHC 27.0*  --  28.0*  --   RDW 19.6*  --  19.6*  --   PLT 765*  --  616*  --    BNP Recent Labs  Lab 11/22/21 0600  BNP 2,954.8*     Radiology/Studies:  DG Chest Port 1 View  Result Date: 11/21/2021 CLINICAL DATA:  Respiratory distress EXAM: PORTABLE CHEST 1 VIEW COMPARISON:  None. FINDINGS: Hazy bilateral opacities, likely pulmonary edema. No pneumothorax or sizable pleural effusion. Cardiomediastinal contours are normal. IMPRESSION: Bilateral hazy opacities, likely pulmonary edema. Electronically Signed   By: Ulyses Jarred M.D.   On: 11/21/2021 20:10     Assessment and Plan:  Non-STEMI with known CAD -Sudden onset chest pain and pressure yesterday.  Currently pain-free on IV heparin and nitroglycerin drip. - Hs-troponin  121>>449>>1527. EKG with new deep TWI in lead V3-V4 - CAth 01/2021 with Severe Multivessel CAD:  Ostial LM 40 to 50%, distal LM 50 to 60% involving ostial LCx 80;  mid LAD 90% (at major septal and diagonal branches);  subtotal occluded distal RCA at PDA with 99% lesion into PAV-PL (PDA fills via septal collaterals, PL fills via LCx collaterals) Did not felt CABG candidate at that time.  -Thankfully currently patient is chest pain-free.  Continue IV heparin and nitroglycerin drip. -Continue prior home medication aspirin 81 mg daily, carvedilol 3.25 mg twice daily and Lipitor 80 mg daily -Will load with Plavix 300 mg now given ACS (home meds listed as Plavix 75 daily) -Likely cardiac catheterization Monday if able to communicate better.  Currently requiring BiPAP.   2.  Ischemic cardiomyopathy - LVEF 35-40% on 01/2021 -Most Recent echo 11/16/21 showed LVEF further down to 25%. Has WM abnormality.  -No significantly volume overloaded on limited exam -BNP 2954 -Give 1 dose of IV Lasix -Watch renal function closely - Strict I & O and daily weight  -Continue IV nitroglycerin drip for afterload reduction -Resume Jardiance and BiDil, likely tomorrow based on renal function  3.  Leukocytosis -Checks x-ray concerning for right lobe pneumonia -Will defer antibiotic for primary team  4.  Recent UTI and urinary retention -This was at last admission -Per primary team  5. CKD III-IV - Scr at baseline of 1.6-1.7   Risk Assessment/Risk Scores:   TIMI Risk Score for Unstable Angina or Non-ST Elevation MI:   The patient's TIMI risk score is 6, which indicates a 41% risk of all cause mortality, new or recurrent myocardial infarction or need for urgent revascularization in the next 14 days.  New York Heart Association (NYHA) Functional Class NYHA  Class III        For questions or updates, please contact [facility] Please consult [email or web address] for contact info under    Agree with assessment above. Mr Roswell Ndiaye is a 80 y.o. 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 Darrick Meigs. Mr Brad is in respiratory distress with pulmonary edema, ?PNA with leukocytosis. He has known systolic dysfunction, EF is down globally 30%. He has moderate MR. He is not in cardiogenic shock.  His EKG c/w possible LAD infarct. With otherwise NSR. He had a significant delta troponin elevation 121->149-> 1527.  Undergoing LHC with attempt for PCI would be high risk at this time. Will plan to treat him medically for NSTEMI. Will also diurese him to help wean from bipap. Agree with nitro gtt for afterload reduction. Once he is more stable, can re-address LHC at that time.   Physical Exam Gen: cachectic, BIPAP on, sleeping CV: r,r,r no murmurs.  Vasc: 2+ radial pulses Pulm: BIPAP on, decreased breath sounds throughout Abd: non distended Ext: No LE edema Skin: warm and well perfused Psych: normal mood

## 2021-11-22 NOTE — Progress Notes (Signed)
After 90 minutes off of bipap pt's WOB suddenly increased and pt felt like he couldn't catch his breath. Placed back on bipap at 0930, tolerating well.

## 2021-11-22 NOTE — Progress Notes (Addendum)
[facility]  PROGRESS NOTE  Randy [name] IPJ:[id number] DOB: 02/07/1941 DOA: 11/21/2021 PCP: Jilda Panda, MD   Brief HPI:   80 year old male with unclear medical history was brought to the hospital from [facility] via EMS for shortness of breath.  In the ED patient was found to be tachypneic, put on BiPAP, initial O2 sats was 87% on room air, improved to 100% on BiPAP with settings of 12/6 and 45% FiO2.  Initial troponin was 121, WBC count 19,000.  EKG showed sinus rhythm with prolonged PR interval, T wave inversions in V2 through 5, no evidence of ST changes.  Chest x-ray showed pulmonary edema.  He was started on heparin drip along with nitroglycerin drip.  Troponin went up to 1527, BNP 2,954    Subjective   Sitting comfortably in the bed and eating breakfast.  Communication limited due to language barrier.     Assessment/Plan:     NSTEMI -Patient presented with dyspnea -Chest x-ray confirmed pulmonary edema; started on nitroglycerin, heparin GTT -Continue aspirin -Troponin went up to 1527, BNP 3000 -EKG showed diffuse T wave inversions in anterior chest leads -Still requiring 7 to 8 L/min oxygen via nasal cannula -Echocardiogram has been ordered, will follow the results -Consult cardiology for further recommendations  Acute hypoxic and hypercapnic respiratory failure -Likely in the setting of NSTEMI, flash pulmonary edema -Continue BiPAP as needed -Diuresis as per cardiology  Diabetes mellitus type 2 -Patient presented with blood glucose of 411 -Started on sliding scale insulin with NovoLog -Follow hemoglobin A1c  Acute kidney injury versus CKD stage III -Patient presented with creatinine of 2.06, unknown baseline -Creatinine has improved to 1.6 at this morning -Follow UA, urine sodium, urine creatinine -Continue to trend serum creatinine in the hospital  ?  Community-acquired pneumonia -Chest x-ray concerning for right lower lobe infiltrate -We  will start ceftriaxone Zithromax, procalcitonin 0.16   Medications     aspirin  325 mg Oral Daily   insulin aspart  0-6 Units Subcutaneous TID WC     Data Reviewed:   CBG:  Recent Labs  Lab 11/22/21 0801  GLUCAP 119*    SpO2: 100 %    Vitals:   11/22/21 0445 11/22/21 0500 11/22/21 0615 11/22/21 0745  BP: 127/71 119/63 139/85 124/60  Pulse: 73 66 75 73  Resp: (!) 25 20 (!) 29 (!) 31  Temp:      TempSrc:      SpO2: 100% 100% 100% 100%     Intake/Output Summary (Last 24 hours) at 11/22/2021 0845 Last data filed at 11/22/2021 0753 Gross per 24 hour  Intake 130.75 ml  Output --  Net 130.75 ml    No intake/output data recorded.  There were no vitals filed for this visit.  Data Reviewed: Basic Metabolic Panel: Recent Labs  Lab 11/21/21 1945 11/21/21 2006 11/22/21 0600 11/22/21 0628  NA 134* 136  137 139 138  K 4.1 4.2  4.3 4.3 4.3  CL 97* 100 106  --   CO2 22  --  24  --   GLUCOSE 411* 410* 139*  --   BUN 58* 65* 57*  --   CREATININE 2.06* 2.00* 1.68*  --   CALCIUM 8.5*  --  8.5*  --   MG  --   --  2.2  --   PHOS  --   --  4.4  --    Liver Function Tests: Recent Labs  Lab 11/22/21 0600  AST 46*  ALT 48*  ALKPHOS 88  BILITOT 0.5  PROT 6.6  ALBUMIN 3.0*   No results for input(s): LIPASE, AMYLASE in the last 168 hours. No results for input(s): AMMONIA in the last 168 hours. CBC: Recent Labs  Lab 11/21/21 1945 11/21/21 2006 11/22/21 0600 11/22/21 0628  WBC 19.4*  --  17.8*  --   NEUTROABS 11.7*  --  14.2*  --   HGB 11.1* 14.3  13.9 10.3* 11.6*  HCT 41.1 42.0  41.0 36.8* 34.0*  MCV 77.8*  --  76.3*  --   PLT 765*  --  616*  --    Cardiac Enzymes: No results for input(s): CKTOTAL, CKMB, CKMBINDEX, TROPONINI in the last 168 hours. BNP (last 3 results) Recent Labs    11/22/21 0600  BNP 2,954.8*    ProBNP (last 3 results) No results for input(s): PROBNP in the last 8760 hours.  CBG: Recent Labs  Lab 11/22/21 0801  GLUCAP  119*       Radiology Reports  DG Chest Port 1 View  Result Date: 11/21/2021 CLINICAL DATA:  Respiratory distress EXAM: PORTABLE CHEST 1 VIEW COMPARISON:  None. FINDINGS: Hazy bilateral opacities, likely pulmonary edema. No pneumothorax or sizable pleural effusion. Cardiomediastinal contours are normal. IMPRESSION: Bilateral hazy opacities, likely pulmonary edema. Electronically Signed   By: Ulyses Jarred M.D.   On: 11/21/2021 20:10       Antibiotics: Anti-infectives (From admission, onward)    None         DVT prophylaxis: Heparin GTT  Code Status: Full code  Family Communication: No family at bedside   Consultants:   Procedures:     Objective    Physical Examination:   General-appears in no acute distress Heart-S1-S2, regular, no murmur auscultated Lungs-clear to auscultation bilaterally, no wheezing or crackles auscultated Abdomen-soft, nontender, no organomegaly Extremities-no edema in the lower extremities Neuro-alert, unable to check orientation due to language barrier  Status is: Inpatient  Dispo: The patient is from: Home              Anticipated d/c is to: Home              Anticipated d/c date is: 11/25/2021              Patient currently not stable for discharge  Barrier to discharge-ongoing treatment for NSTEMI  COVID-19 Labs  No results for input(s): DDIMER, FERRITIN, LDH, CRP in the last 72 hours.  No results found for: SARSCOV2NAA          No results found for this or any previous visit (from the past 240 hour(s)).  Oswald Hillock   [facility] If 7PM-7AM, please contact night-coverage at [email or web address], Office  [phone]   11/22/2021, 8:45 AM  LOS: 1 day

## 2021-11-22 NOTE — Progress Notes (Signed)
ANTICOAGULATION CONSULT NOTE  Pharmacy Consult for Heparin Indication: atrial fibrillation  Not on File  Patient Measurements:   Heparin Dosing Weight: 56.2 kg  Vital Signs: Temp: 97.6 F (36.4 C) (12/01 2100) Temp Source: Axillary (12/01 2100) BP: 139/85 (12/02 0615) Pulse Rate: 75 (12/02 0615)  Labs: Recent Labs    11/21/21 1945 11/21/21 2006 11/21/21 2215 11/21/21 2315 11/22/21 0600 11/22/21 0628  HGB 11.1* 14.3  13.9  --   --  10.3* 11.6*  HCT 41.1 42.0  41.0  --   --  36.8* 34.0*  PLT 765*  --   --   --  616*  --   LABPROT  --   --   --  13.5  --   --   INR  --   --   --  1.0  --   --   HEPARINUNFRC  --   --   --   --  0.27*  --   CREATININE 2.06* 2.00*  --   --   --   --   TROPONINIHS 121*  --  449*  --   --   --      CrCl cannot be calculated (Unknown ideal weight.).   Medical History: History reviewed. No pertinent past medical history.  Medications:  (Not in a hospital admission) Scheduled:   aspirin  325 mg Oral Daily   insulin aspart  0-6 Units Subcutaneous TID WC   Infusions:   heparin 900 Units/hr (11/21/21 2216)   nitroGLYCERIN 12.5 mcg/min (11/21/21 2000)    Assessment: 49 yom presenting with SOB. Heparin per pharmacy consult placed for atrial fibrillation.  Patient is not on anticoagulation prior to arrival.  Hgb 11.1; plt 765  12/2 AM update:  Heparin level below goal  Goal of Therapy:  Heparin level 0.3-0.7 units/ml Monitor platelets by anticoagulation protocol: Yes   Plan:  Inc heparin to 1000 units/hr 1500 heparin level  Narda Bonds, PharmD, BCPS Clinical Pharmacist Phone: [phone]

## 2021-11-22 NOTE — Progress Notes (Signed)
Pt removed from bipap and placed on 5L [place]. Tolerating well at this time.

## 2021-11-22 NOTE — ED Notes (Signed)
Patient is resting comfortably. 

## 2021-11-22 NOTE — ED Notes (Signed)
Pt became agitated, calling out to staff "peepee, poopoo!" RN put pt on bedpan. Pt continent of one large soft stool and urine into bedpan. Pt had previously been incontinent of urine and was still in street clothes. RN assisted pt to chair at bedside and removed street clothes, put pt in fresh gown and changed linens that were also soiled. Pt remained on bipap and tolerated well, was able to stand and pivot to the chair with ease. Pt settled back in bed with fresh gown and linens, IV infusions running, bipap in place. Pt calm, pleasant and cooperative. Call light in reach and no distress noted.

## 2021-11-22 NOTE — Progress Notes (Signed)
ANTICOAGULATION CONSULT NOTE  Pharmacy Consult for Heparin Indication: atrial fibrillation  No Known Allergies  Patient Measurements:   Heparin Dosing Weight: 56.2 kg  Vital Signs: BP: 140/78 (12/02 1601) Pulse Rate: 90 (12/02 1500)  Labs: Recent Labs    11/21/21 1945 11/21/21 2006 11/21/21 2215 11/21/21 2315 11/22/21 0600 11/22/21 0628 11/22/21 1534  HGB 11.1* 14.3  13.9  --   --  10.3* 11.6*  --   HCT 41.1 42.0  41.0  --   --  36.8* 34.0*  --   PLT 765*  --   --   --  616*  --   --   LABPROT  --   --   --  13.5  --   --   --   INR  --   --   --  1.0  --   --   --   HEPARINUNFRC  --   --   --   --  0.27*  --  0.54  CREATININE 2.06* 2.00*  --   --  1.68*  --   --   TROPONINIHS 121*  --  449*  --  1,527*  --   --      CrCl cannot be calculated (Unknown ideal weight.).  Assessment: 1 yom presenting with SOB. Heparin per pharmacy consult placed for atrial fibrillation.  Heparin level is therapeutic; no bleeding reported.  Goal of Therapy:  Heparin level 0.3-0.7 units/ml Monitor platelets by anticoagulation protocol: Yes   Plan:  Continue heparin gtt at 1000 units/hr F/U AM labs  Fern Asmar D. Mina Marble, PharmD, BCPS, [facility] 11/22/2021, 4:30 PM

## 2021-11-22 NOTE — ED Notes (Signed)
Patient is resting comfortably. BIPAP continues and pt is in NAD. Awaiting bed placement

## 2021-11-23 DIAGNOSIS — I214 Non-ST elevation (NSTEMI) myocardial infarction: Secondary | ICD-10-CM | POA: Diagnosis not present

## 2021-11-23 DIAGNOSIS — J9602 Acute respiratory failure with hypercapnia: Secondary | ICD-10-CM | POA: Diagnosis not present

## 2021-11-23 DIAGNOSIS — I255 Ischemic cardiomyopathy: Secondary | ICD-10-CM | POA: Diagnosis not present

## 2021-11-23 DIAGNOSIS — J9601 Acute respiratory failure with hypoxia: Secondary | ICD-10-CM | POA: Diagnosis not present

## 2021-11-23 LAB — LIPID PANEL
Cholesterol: 134 mg/dL (ref 0–200)
HDL: 60 mg/dL (ref >40)
LDL Cholesterol: 60 mg/dL (ref 0–99)
Total CHOL/HDL Ratio: 2.2 RATIO
Triglycerides: 71 mg/dL (ref <150)
VLDL: 14 mg/dL (ref 0–40)

## 2021-11-23 LAB — CBC
HCT: 29.8 % — ABNORMAL LOW (ref 39.0–52.0)
Hemoglobin: 8.6 g/dL — ABNORMAL LOW (ref 13.0–17.0)
MCH: 21.1 pg — ABNORMAL LOW (ref 26.0–34.0)
MCHC: 28.9 g/dL — ABNORMAL LOW (ref 30.0–36.0)
MCV: 73.2 fL — ABNORMAL LOW (ref 80.0–100.0)
Platelets: 622 10*3/uL — ABNORMAL HIGH (ref 150–400)
RBC: 4.07 MIL/uL — ABNORMAL LOW (ref 4.22–5.81)
RDW: 19.8 % — ABNORMAL HIGH (ref 11.5–15.5)
WBC: 11.4 10*3/uL — ABNORMAL HIGH (ref 4.0–10.5)
nRBC: 0 % (ref 0.0–0.2)

## 2021-11-23 LAB — COMPREHENSIVE METABOLIC PANEL
ALT: 44 U/L (ref 0–44)
AST: 42 U/L — ABNORMAL HIGH (ref 15–41)
Albumin: 3.1 g/dL — ABNORMAL LOW (ref 3.5–5.0)
Alkaline Phosphatase: 85 U/L (ref 38–126)
Anion gap: 10 (ref 5–15)
BUN: 42 mg/dL — ABNORMAL HIGH (ref 8–23)
CO2: 26 mmol/L (ref 22–32)
Calcium: 8.3 mg/dL — ABNORMAL LOW (ref 8.9–10.3)
Chloride: 98 mmol/L (ref 98–111)
Creatinine, Ser: 1.81 mg/dL — ABNORMAL HIGH (ref 0.61–1.24)
GFR, Estimated: 37 mL/min — ABNORMAL LOW (ref >60)
Glucose, Bld: 169 mg/dL — ABNORMAL HIGH (ref 70–99)
Potassium: 4.1 mmol/L (ref 3.5–5.1)
Sodium: 134 mmol/L — ABNORMAL LOW (ref 135–145)
Total Bilirubin: 0.6 mg/dL (ref 0.3–1.2)
Total Protein: 6.7 g/dL (ref 6.5–8.1)

## 2021-11-23 LAB — BASIC METABOLIC PANEL
Anion gap: 8 (ref 5–15)
BUN: 42 mg/dL — ABNORMAL HIGH (ref 8–23)
CO2: 24 mmol/L (ref 22–32)
Calcium: 8.2 mg/dL — ABNORMAL LOW (ref 8.9–10.3)
Chloride: 103 mmol/L (ref 98–111)
Creatinine, Ser: 1.63 mg/dL — ABNORMAL HIGH (ref 0.61–1.24)
GFR, Estimated: 42 mL/min — ABNORMAL LOW (ref >60)
Glucose, Bld: 152 mg/dL — ABNORMAL HIGH (ref 70–99)
Potassium: 4.3 mmol/L (ref 3.5–5.1)
Sodium: 135 mmol/L (ref 135–145)

## 2021-11-23 LAB — GLUCOSE, CAPILLARY
Glucose-Capillary: 144 mg/dL — ABNORMAL HIGH (ref 70–99)
Glucose-Capillary: 225 mg/dL — ABNORMAL HIGH (ref 70–99)
Glucose-Capillary: 172 mg/dL — ABNORMAL HIGH (ref 70–99)
Glucose-Capillary: 230 mg/dL — ABNORMAL HIGH (ref 70–99)

## 2021-11-23 LAB — HEPARIN LEVEL (UNFRACTIONATED): Heparin Unfractionated: 0.51 IU/mL (ref 0.30–0.70)

## 2021-11-23 MED ORDER — POTASSIUM CHLORIDE CRYS ER 20 MEQ PO TBCR
40.0000 meq | EXTENDED_RELEASE_TABLET | Freq: Two times a day (BID) | ORAL | Status: DC
  Administered 2021-11-23 – 2021-11-24 (×2): 40 meq via ORAL
  Filled 2021-11-23 (×5): qty 2

## 2021-11-23 MED ORDER — FUROSEMIDE 10 MG/ML IJ SOLN
80.0000 mg | Freq: Once | INTRAMUSCULAR | Status: AC
  Administered 2021-11-23: 80 mg via INTRAVENOUS
  Filled 2021-11-23: qty 8

## 2021-11-23 NOTE — Progress Notes (Signed)
ANTICOAGULATION CONSULT NOTE  Pharmacy Consult for Heparin Indication: chest pain/ACS  No Known Allergies  Patient Measurements: Weight: 58.2 kg (128 lb 4.9 oz) Heparin Dosing Weight: 56.2 kg  Vital Signs: Temp: 98.1 F (36.7 C) (12/03 0345) Temp Source: Oral (12/03 0345) BP: 116/63 (12/03 0649) Pulse Rate: 79 (12/03 0649)  Labs: Recent Labs    11/21/21 1945 11/21/21 2006 11/21/21 2215 11/21/21 2315 11/22/21 0600 11/22/21 0628 11/22/21 1534 11/23/21 0700  HGB 11.1* 14.3  13.9  --   --  10.3* 11.6*  --  8.6*  HCT 41.1 42.0  41.0  --   --  36.8* 34.0*  --  29.8*  PLT 765*  --   --   --  616*  --   --  622*  LABPROT  --   --   --  13.5  --   --   --   --   INR  --   --   --  1.0  --   --   --   --   HEPARINUNFRC  --   --   --   --  0.27*  --  0.54 0.51  CREATININE 2.06* 2.00*  --   --  1.68*  --   --   --   TROPONINIHS 121*  --  449*  --  1,527*  --   --   --      CrCl cannot be calculated (Unknown ideal weight.).  Assessment: 44 yom presenting with SOB. Heparin per pharmacy consult placed for chest pain/ACS.  Heparin level is therapeutic, H/H down but no bleeding issues noted overnight per RN.  Goal of Therapy:  Heparin level 0.3-0.7 units/ml Monitor platelets by anticoagulation protocol: Yes   Plan:  Continue heparin gtt at 1000 units/hr Daily heparin level and CBC  Arrie Senate, PharmD, [place], [facility] Clinical Pharmacist [phone] Please check AMION for all [facility] numbers 11/23/2021

## 2021-11-23 NOTE — Progress Notes (Signed)
Pt refused lab draws around midnight. Pt states that he wanted to sleep. Will retry lab draws later in the morning.

## 2021-11-23 NOTE — Progress Notes (Addendum)
PROGRESS NOTE    Randy [name]  DXI:[id number] DOB: 03/15/1941 DOA: 11/21/2021 PCP: Randy Panda, MD (   Brief Narrative:   80 years old male with unclear medical history came with a chief complaint of shortness of breath.  In the emergency room patient was found to be tachypneic started on BiPAP.  His troponin went up to 1500 and BNP 2904 started on heparin drip and nitroglycerin drip.  Cardiology was consulted  Assessment and plan  Acute respiratory failure with hypoxia Secondary to congestive heart failure questionable pneumonia Improved change from BiPAP to nasal cannula Resume Lasix  Non-STEMI Patient on oxygen nasal cannula now on room air Nasal cannula irritated his nose very small bleeding Echocardiogram moderate reduced ejection fraction to 35 to 40% cardiac output index 6.05, 3.2 Mild secondary pulmonary hypertension with pulmonary artery pressure of 26 and left ventricular end-diastolic pressure of 76-73 Plan to treat non-STEMI medically Patient loaded with Plavix 300 mg then Plavix 75 mg Aspirin 81 mg daily Lipitor 80 mg daily Heparin drip  Acute congestive heart failure with low ejection fraction Ischemic Is negative a 859 cc on Lasix 80 mg IV daily Nitroglycerin drips Goal to get negative after 1 L  PNA Leukocytosis improving Started on Rocephin  Anemia Check H&H hemoglobin dropped from 11-8  Assessment & Plan:   Principal Problem:   Acute respiratory failure with hypoxia and hypercapnia (HCC) Active Problems:   SOB (shortness of breath)   Elevated troponin   Hyperglycemia   Blood creatinine increased compared with prior measurement      DVT prophylaxis: Heparin Code Status: Full code Family Communication: No family in the room Disposition Plan: Continue present treatment reevaluate next week   Consultants:  Phineas Inches cardiology  Procedures:    Antimicrobials:  Rocephin and Zithromax  Subjective: Shortness of breath unable to  keep nasal cannula small epistaxis   Objective: Vitals:   11/23/21 0640 11/23/21 0649 11/23/21 0748 11/23/21 1119  BP:  116/63 (!) 104/52 125/67  Pulse:  79 79 72  Resp:   19 17  Temp:   97.8 F (36.6 C) 97.8 F (36.6 C)  TempSrc:   Oral Oral  SpO2:   100% 96%  Weight: 58.2 kg       Intake/Output Summary (Last 24 hours) at 11/23/2021 1302 Last data filed at 11/23/2021 1200 Gross per 24 hour  Intake 1090.84 ml  Output 1601 ml  Net -510.16 ml   Filed Weights   11/23/21 0640  Weight: 58.2 kg    Examination:  General exam: Appears calm and comfortable  Respiratory system:  Cardiovascular system: S1 & S2 heard, RRR. + JVD, murmurs, rubs, gallops or clicks. No pedal edema. Gastrointestinal system: Abdomen is nondistended, soft and nontender. No organomegaly or masses felt. Normal bowel sounds heard. Central nervous system: Alert and oriented. No focal neurological deficits. Extremities: Symmetric 5 x 5 power. Skin: No rashes, lesions or ulcers Psychiatry: Hard to be evaluated because of the language from    Data Reviewed: I have personally reviewed following labs and imaging studies  CBC: Recent Labs  Lab 11/21/21 1945 11/21/21 2006 11/22/21 0600 11/22/21 0628 11/23/21 0700  WBC 19.4*  --  17.8*  --  11.4*  NEUTROABS 11.7*  --  14.2*  --   --   HGB 11.1* 14.3  13.9 10.3* 11.6* 8.6*  HCT 41.1 42.0  41.0 36.8* 34.0* 29.8*  MCV 77.8*  --  76.3*  --  73.2*  PLT 765*  --  616*  --  637*   Basic Metabolic Panel: Recent Labs  Lab 11/21/21 1945 11/21/21 2006 11/22/21 0600 11/22/21 0628 11/23/21 0700  NA 134* 136  137 139 138 135  K 4.1 4.2  4.3 4.3 4.3 4.3  CL 97* 100 106  --  103  CO2 22  --  24  --  24  GLUCOSE 411* 410* 139*  --  152*  BUN 58* 65* 57*  --  42*  CREATININE 2.06* 2.00* 1.68*  --  1.63*  CALCIUM 8.5*  --  8.5*  --  8.2*  MG  --   --  2.2  --   --   PHOS  --   --  4.4  --   --    GFR: CrCl cannot be calculated (Unknown ideal  weight.). Liver Function Tests: Recent Labs  Lab 11/22/21 0600  AST 46*  ALT 48*  ALKPHOS 88  BILITOT 0.5  PROT 6.6  ALBUMIN 3.0*   No results for input(s): LIPASE, AMYLASE in the last 168 hours. No results for input(s): AMMONIA in the last 168 hours. Coagulation Profile: Recent Labs  Lab 11/21/21 2315  INR 1.0   Cardiac Enzymes: No results for input(s): CKTOTAL, CKMB, CKMBINDEX, TROPONINI in the last 168 hours. BNP (last 3 results) No results for input(s): PROBNP in the last 8760 hours. HbA1C: Recent Labs    11/22/21 0600  HGBA1C 7.2*   CBG: Recent Labs  Lab 11/22/21 1134 11/22/21 1603 11/22/21 2125 11/23/21 0631 11/23/21 1116  GLUCAP 220* 215* 198* 144* 225*   Lipid Profile: Recent Labs    11/23/21 0700  CHOL 134  HDL 60  LDLCALC 60  TRIG 71  CHOLHDL 2.2   Thyroid Function Tests: No results for input(s): TSH, T4TOTAL, FREET4, T3FREE, THYROIDAB in the last 72 hours. Anemia Panel: No results for input(s): VITAMINB12, FOLATE, FERRITIN, TIBC, IRON, RETICCTPCT in the last 72 hours. Sepsis Labs: Recent Labs  Lab 11/22/21 0600  PROCALCITON 0.16    No results found for this or any previous visit (from the past 240 hour(s)).       Radiology Studies: DG Chest Port 1 View  Result Date: 11/21/2021 CLINICAL DATA:  Respiratory distress EXAM: PORTABLE CHEST 1 VIEW COMPARISON:  None. FINDINGS: Hazy bilateral opacities, likely pulmonary edema. No pneumothorax or sizable pleural effusion. Cardiomediastinal contours are normal. IMPRESSION: Bilateral hazy opacities, likely pulmonary edema. Electronically Signed   By: Ulyses Jarred M.D.   On: 11/21/2021 20:10   ECHOCARDIOGRAM COMPLETE  Result Date: 11/22/2021    ECHOCARDIOGRAM REPORT   Patient Name:   Randy [name] Date of Exam: 11/22/2021 Medical Rec #:  [id number]          Height: Accession #:    [id number]         Weight: Date of Birth:  1941/09/21           BSA: Patient Age:    34 years           BP:            127/71 mmHg Patient Gender: M                  HR:           73 bpm. Exam Location:  Inpatient Procedure: 2D Echo, Cardiac Doppler and Color Doppler Indications:    CHF  History:        Patient has no prior history of Echocardiogram examinations.  Signs/Symptoms:Shortness of Breath.  Sonographer:    Glo Herring Referring Phys: [id number] [place]  1. Left ventricular ejection fraction, by estimation, is 30 to 35%. The left ventricle has moderately decreased function. The left ventricle demonstrates global hypokinesis. There is moderate left ventricular hypertrophy. Left ventricular diastolic parameters are consistent with Grade III diastolic dysfunction (restrictive). Elevated left atrial pressure.  2. Right ventricular systolic function is normal. The right ventricular size is normal. There is moderately elevated pulmonary artery systolic pressure. The estimated right ventricular systolic pressure is 48.1 mmHg.  3. Left atrial size was severely dilated.  4. The mitral valve is normal in structure. Moderate mitral valve regurgitation. Appears functional.  5. The aortic valve is tricuspid. Aortic valve regurgitation is mild. Aortic valve sclerosis/calcification is present, without any evidence of aortic stenosis.  6. The inferior vena cava is dilated in size with <50% respiratory variability, suggesting right atrial pressure of 15 mmHg. FINDINGS  Left Ventricle: Left ventricular ejection fraction, by estimation, is 30 to 35%. The left ventricle has moderately decreased function. The left ventricle demonstrates global hypokinesis. The left ventricular internal cavity size was normal in size. There is moderate left ventricular hypertrophy. Left ventricular diastolic parameters are consistent with Grade III diastolic dysfunction (restrictive). Elevated left atrial pressure. Right Ventricle: The right ventricular size is normal. No increase in right ventricular wall thickness.  Right ventricular systolic function is normal. There is moderately elevated pulmonary artery systolic pressure. The tricuspid regurgitant velocity is 3.04 m/s, and with an assumed right atrial pressure of 15 mmHg, the estimated right ventricular systolic pressure is 85.6 mmHg. Left Atrium: Left atrial size was severely dilated. Right Atrium: Right atrial size was normal in size. Pericardium: Trivial pericardial effusion is present. Mitral Valve: The mitral valve is normal in structure. Moderate mitral valve regurgitation. Tricuspid Valve: The tricuspid valve is normal in structure. Tricuspid valve regurgitation is trivial. Aortic Valve: The aortic valve is tricuspid. Aortic valve regurgitation is mild. Aortic valve sclerosis/calcification is present, without any evidence of aortic stenosis. Pulmonic Valve: The pulmonic valve was not well visualized. Pulmonic valve regurgitation is trivial. Aorta: The aortic root and ascending aorta are structurally normal, with no evidence of dilitation. Venous: The inferior vena cava is dilated in size with less than 50% respiratory variability, suggesting right atrial pressure of 15 mmHg. IAS/Shunts: The interatrial septum was not well visualized.  LEFT VENTRICLE PLAX 2D LVIDd:         4.90 cm      Diastology LVIDs:         4.20 cm      LV e' medial:    3.15 cm/s LV PW:         1.40 cm      LV E/e' medial:  31.1 LV IVS:        1.40 cm      LV e' lateral:   9.57 cm/s LVOT diam:     2.00 cm      LV E/e' lateral: 10.3 LVOT Area:     3.14 cm  LV Volumes (MOD) LV vol d, MOD A2C: 149.0 ml LV vol d, MOD A4C: 162.0 ml LV vol s, MOD A2C: 100.0 ml LV vol s, MOD A4C: 108.0 ml LV SV MOD A2C:     49.0 ml LV SV MOD A4C:     162.0 ml LV SV MOD BP:      49.3 ml RIGHT VENTRICLE  IVC RV Basal diam:  3.50 cm     IVC diam: 2.60 cm RV S prime:     15.10 cm/s LEFT ATRIUM             RIGHT ATRIUM LA diam:        4.90 cm RA Area:     18.80 cm LA Vol (A2C):   91.7 ml RA Volume:   48.70 ml LA  Vol (A4C):   87.6 ml LA Biplane Vol: 91.8 ml                        PULMONIC VALVE AORTA                 PV Vmax:       0.92 m/s Ao Root diam: 3.40 cm PV Peak grad:  3.4 mmHg Ao Asc diam:  3.10 cm  MITRAL VALVE               TRICUSPID VALVE MV Area (PHT): 4.63 cm    TR Peak grad:   37.0 mmHg MV Decel Time: 164 msec    TR Vmax:        304.00 cm/s MV E velocity: 98.10 cm/s MV A velocity: 44.60 cm/s  SHUNTS MV E/A ratio:  2.20        Systemic Diam: 2.00 cm Oswaldo Milian MD Electronically signed by Oswaldo Milian MD Signature Date/Time: 11/22/2021/2:46:10 PM    Final         Scheduled Meds:  aspirin EC  81 mg Oral Daily   atorvastatin  80 mg Oral Daily   carvedilol  3.125 mg Oral BID WC   clopidogrel  75 mg Oral Daily   insulin aspart  0-6 Units Subcutaneous TID WC   potassium chloride  40 mEq Oral BID   Continuous Infusions:  azithromycin 500 mg (11/22/21 1625)   cefTRIAXone (ROCEPHIN)  IV 2 g (11/22/21 1551)   heparin 1,000 Units/hr (11/23/21 0030)   nitroGLYCERIN 12.5 mcg/min (11/22/21 0753)     LOS: 2 days    Time spent: 35 minutes    Assunta Found, MD [facility]    11/23/2021, 1:02 PM

## 2021-11-23 NOTE — Plan of Care (Signed)
  Problem: Clinical Measurements: Goal: Ability to maintain clinical measurements within normal limits will improve 11/23/2021 0208 by Zachery Dauer, RN Outcome: Progressing 11/23/2021 0207 by Zachery Dauer, RN Outcome: Not Progressing Goal: Will remain free from infection 11/23/2021 0208 by Zachery Dauer, RN Outcome: Progressing 11/23/2021 0207 by Zachery Dauer, RN Outcome: Not Progressing Goal: Diagnostic test results will improve 11/23/2021 0208 by Zachery Dauer, RN Outcome: Progressing 11/23/2021 0207 by Zachery Dauer, RN Outcome: Not Progressing Goal: Respiratory complications will improve 11/23/2021 0208 by Zachery Dauer, RN Outcome: Progressing 11/23/2021 0207 by Zachery Dauer, RN Outcome: Not Progressing Goal: Cardiovascular complication will be avoided 11/23/2021 0208 by Zachery Dauer, RN Outcome: Progressing 11/23/2021 0207 by Zachery Dauer, RN Outcome: Not Progressing   Problem: Activity: Goal: Risk for activity intolerance will decrease 11/23/2021 0208 by Zachery Dauer, RN Outcome: Progressing 11/23/2021 0207 by Zachery Dauer, RN Outcome: Not Progressing   Problem: Nutrition: Goal: Adequate nutrition will be maintained 11/23/2021 0208 by Zachery Dauer, RN Outcome: Progressing 11/23/2021 0207 by Zachery Dauer, RN Outcome: Not Progressing   Problem: Safety: Goal: Ability to remain free from injury will improve 11/23/2021 0208 by Zachery Dauer, RN Outcome: Progressing 11/23/2021 0207 by Zachery Dauer, RN Outcome: Not Progressing   Problem: Skin Integrity: Goal: Risk for impaired skin integrity will decrease 11/23/2021 0208 by Zachery Dauer, RN Outcome: Progressing 11/23/2021 0207 by Zachery Dauer, RN Outcome: Not Progressing   Problem: Education: Goal: Knowledge of General Education information will improve Description:  Including pain rating scale, medication(s)/side effects and non-pharmacologic comfort measures 11/23/2021 0208 by Zachery Dauer, RN Outcome: Not Progressing 11/23/2021 0207 by Zachery Dauer, RN Outcome: Not Progressing   Problem: Health Behavior/Discharge Planning: Goal: Ability to manage health-related needs will improve 11/23/2021 0208 by Zachery Dauer, RN Outcome: Not Progressing 11/23/2021 0207 by Zachery Dauer, RN Outcome: Not Progressing   Problem: Coping: Goal: Level of anxiety will decrease 11/23/2021 0208 by Zachery Dauer, RN Outcome: Not Progressing 11/23/2021 0207 by Zachery Dauer, RN Outcome: Not Progressing   Problem: Elimination: Goal: Will not experience complications related to bowel motility 11/23/2021 0208 by Zachery Dauer, RN Outcome: Not Progressing 11/23/2021 0207 by Zachery Dauer, RN Outcome: Not Progressing Goal: Will not experience complications related to urinary retention 11/23/2021 0208 by Zachery Dauer, RN Outcome: Not Progressing 11/23/2021 0207 by Zachery Dauer, RN Outcome: Not Progressing   Problem: Pain Managment: Goal: General experience of comfort will improve 11/23/2021 0208 by Zachery Dauer, RN Outcome: Not Progressing 11/23/2021 0207 by Zachery Dauer, RN Outcome: Not Progressing

## 2021-11-23 NOTE — Progress Notes (Signed)
Progress Note  Patient Name: Kye Hedden Date of Encounter: 11/23/2021  [facility] Cardiologist: None   Subjective   Mr. Blando was sleeping this AM. Off BIPAP  NSR Normal blood pressures on nitro gtt On RA; off BIPAP  Net negative 859 on 40 IV lasix x1  Crt 1.68->1.63 Troponin 121->-449->1527  WBC 17-> 11 started on ceftriaxone Hgb 11->8.6  Of note, chart not merged initially and data may be missing  Inpatient Medications    Scheduled Meds:  aspirin EC  81 mg Oral Daily   atorvastatin  80 mg Oral Daily   carvedilol  3.125 mg Oral BID WC   clopidogrel  75 mg Oral Daily   insulin aspart  0-6 Units Subcutaneous TID WC   Continuous Infusions:  azithromycin 500 mg (11/22/21 1625)   cefTRIAXone (ROCEPHIN)  IV 2 g (11/22/21 1551)   heparin 1,000 Units/hr (11/23/21 0030)   nitroGLYCERIN 12.5 mcg/min (11/22/21 0753)   PRN Meds: acetaminophen **OR** acetaminophen   Vital Signs    Vitals:   11/23/21 0345 11/23/21 0640 11/23/21 0649 11/23/21 0748  BP: 120/69  116/63 (!) 104/52  Pulse: 81  79 79  Resp: 19   19  Temp: 98.1 F (36.7 C)   97.8 F (36.6 C)  TempSrc: Oral   Oral  SpO2: 100%   100%  Weight:  58.2 kg      Intake/Output Summary (Last 24 hours) at 11/23/2021 0808 Last data filed at 11/23/2021 0500 Gross per 24 hour  Intake 610.84 ml  Output 1601 ml  Net -990.16 ml   Last 3 Weights 11/23/2021  Weight (lbs) 128 lb 4.9 oz  Weight (kg) 58.2 kg      Telemetry    NSR - Personally Reviewed  ECG    NA - Personally Reviewed  Physical Exam   Vitals:   11/23/21 0649 11/23/21 0748  BP: 116/63 (!) 104/52  Pulse: 79 79  Resp:  19  Temp:  97.8 F (36.6 C)  SpO2:  100%     GEN: sleeping in no distress Neck: + JVD  Cardiac: RRR, no murmurs, rubs, or gallops.  Respiratory: Clear to auscultation bilaterally. GI: Soft, nontender, non-distended  MS: No edema; No deformity. Neuro:  Nonfocal  Skin: warm and well perfused Psych:  Normal affect   Labs    High Sensitivity Troponin:   Recent Labs  Lab 11/21/21 1945 11/21/21 2215 11/22/21 0600  TROPONINIHS 121* 449* 1,527*     Chemistry Recent Labs  Lab 11/21/21 1945 11/21/21 2006 11/22/21 0600 11/22/21 0628 11/23/21 0700  NA 134* 136  137 139 138 135  K 4.1 4.2  4.3 4.3 4.3 4.3  CL 97* 100 106  --  103  CO2 22  --  24  --  24  GLUCOSE 411* 410* 139*  --  152*  BUN 58* 65* 57*  --  42*  CREATININE 2.06* 2.00* 1.68*  --  1.63*  CALCIUM 8.5*  --  8.5*  --  8.2*  MG  --   --  2.2  --   --   PROT  --   --  6.6  --   --   ALBUMIN  --   --  3.0*  --   --   AST  --   --  46*  --   --   ALT  --   --  48*  --   --   ALKPHOS  --   --  88  --   --  BILITOT  --   --  0.5  --   --   GFRNONAA 32*  --  40*  --  42*  ANIONGAP 15  --  9  --  8    Lipids No results for input(s): CHOL, TRIG, HDL, LABVLDL, LDLCALC, CHOLHDL in the last 168 hours.  Hematology Recent Labs  Lab 11/21/21 1945 11/21/21 2006 11/22/21 0600 11/22/21 0628 11/23/21 0700  WBC 19.4*  --  17.8*  --  11.4*  RBC 5.28  --  4.82  --  4.07*  HGB 11.1*   < > 10.3* 11.6* 8.6*  HCT 41.1   < > 36.8* 34.0* 29.8*  MCV 77.8*  --  76.3*  --  73.2*  MCH 21.0*  --  21.4*  --  21.1*  MCHC 27.0*  --  28.0*  --  28.9*  RDW 19.6*  --  19.6*  --  19.8*  PLT 765*  --  616*  --  622*   < > = values in this interval not displayed.   Thyroid No results for input(s): TSH, FREET4 in the last 168 hours.  BNP Recent Labs  Lab 11/22/21 0600  BNP 2,954.8*    DDimer No results for input(s): DDIMER in the last 168 hours.   Radiology    DG Chest Port 1 View  Result Date: 11/21/2021 CLINICAL DATA:  Respiratory distress EXAM: PORTABLE CHEST 1 VIEW COMPARISON:  None. FINDINGS: Hazy bilateral opacities, likely pulmonary edema. No pneumothorax or sizable pleural effusion. Cardiomediastinal contours are normal. IMPRESSION: Bilateral hazy opacities, likely pulmonary edema. Electronically Signed   By: Ulyses Jarred M.D.   On: 11/21/2021 20:10   ECHOCARDIOGRAM COMPLETE  Result Date: 11/22/2021    ECHOCARDIOGRAM REPORT   Patient Name:   RYAAN VANWAGONER Date of Exam: 11/22/2021 Medical Rec #:  [id number]          Height: Accession #:    [id number]         Weight: Date of Birth:  Dec 03, 1941           BSA: Patient Age:    80 years           BP:           127/71 mmHg Patient Gender: M                  HR:           73 bpm. Exam Location:  Inpatient Procedure: 2D Echo, Cardiac Doppler and Color Doppler Indications:    CHF  History:        Patient has no prior history of Echocardiogram examinations.                 Signs/Symptoms:Shortness of Breath.  Sonographer:    Glo Herring Referring Phys: [id number] [place]  1. Left ventricular ejection fraction, by estimation, is 30 to 35%. The left ventricle has moderately decreased function. The left ventricle demonstrates global hypokinesis. There is moderate left ventricular hypertrophy. Left ventricular diastolic parameters are consistent with Grade III diastolic dysfunction (restrictive). Elevated left atrial pressure.  2. Right ventricular systolic function is normal. The right ventricular size is normal. There is moderately elevated pulmonary artery systolic pressure. The estimated right ventricular systolic pressure is 70.2 mmHg.  3. Left atrial size was severely dilated.  4. The mitral valve is normal in structure. Moderate mitral valve regurgitation. Appears functional.  5. The aortic valve is tricuspid. Aortic valve regurgitation is mild. Aortic  valve sclerosis/calcification is present, without any evidence of aortic stenosis.  6. The inferior vena cava is dilated in size with <50% respiratory variability, suggesting right atrial pressure of 15 mmHg. FINDINGS  Left Ventricle: Left ventricular ejection fraction, by estimation, is 30 to 35%. The left ventricle has moderately decreased function. The left ventricle demonstrates global hypokinesis. The  left ventricular internal cavity size was normal in size. There is moderate left ventricular hypertrophy. Left ventricular diastolic parameters are consistent with Grade III diastolic dysfunction (restrictive). Elevated left atrial pressure. Right Ventricle: The right ventricular size is normal. No increase in right ventricular wall thickness. Right ventricular systolic function is normal. There is moderately elevated pulmonary artery systolic pressure. The tricuspid regurgitant velocity is 3.04 m/s, and with an assumed right atrial pressure of 15 mmHg, the estimated right ventricular systolic pressure is 53.6 mmHg. Left Atrium: Left atrial size was severely dilated. Right Atrium: Right atrial size was normal in size. Pericardium: Trivial pericardial effusion is present. Mitral Valve: The mitral valve is normal in structure. Moderate mitral valve regurgitation. Tricuspid Valve: The tricuspid valve is normal in structure. Tricuspid valve regurgitation is trivial. Aortic Valve: The aortic valve is tricuspid. Aortic valve regurgitation is mild. Aortic valve sclerosis/calcification is present, without any evidence of aortic stenosis. Pulmonic Valve: The pulmonic valve was not well visualized. Pulmonic valve regurgitation is trivial. Aorta: The aortic root and ascending aorta are structurally normal, with no evidence of dilitation. Venous: The inferior vena cava is dilated in size with less than 50% respiratory variability, suggesting right atrial pressure of 15 mmHg. IAS/Shunts: The interatrial septum was not well visualized.  LEFT VENTRICLE PLAX 2D LVIDd:         4.90 cm      Diastology LVIDs:         4.20 cm      LV e' medial:    3.15 cm/s LV PW:         1.40 cm      LV E/e' medial:  31.1 LV IVS:        1.40 cm      LV e' lateral:   9.57 cm/s LVOT diam:     2.00 cm      LV E/e' lateral: 10.3 LVOT Area:     3.14 cm  LV Volumes (MOD) LV vol d, MOD A2C: 149.0 ml LV vol d, MOD A4C: 162.0 ml LV vol s, MOD A2C: 100.0 ml LV  vol s, MOD A4C: 108.0 ml LV SV MOD A2C:     49.0 ml LV SV MOD A4C:     162.0 ml LV SV MOD BP:      49.3 ml RIGHT VENTRICLE             IVC RV Basal diam:  3.50 cm     IVC diam: 2.60 cm RV S prime:     15.10 cm/s LEFT ATRIUM             RIGHT ATRIUM LA diam:        4.90 cm RA Area:     18.80 cm LA Vol (A2C):   91.7 ml RA Volume:   48.70 ml LA Vol (A4C):   87.6 ml LA Biplane Vol: 91.8 ml                        PULMONIC VALVE AORTA                 PV Vmax:  0.92 m/s Ao Root diam: 3.40 cm PV Peak grad:  3.4 mmHg Ao Asc diam:  3.10 cm  MITRAL VALVE               TRICUSPID VALVE MV Area (PHT): 4.63 cm    TR Peak grad:   37.0 mmHg MV Decel Time: 164 msec    TR Vmax:        304.00 cm/s MV E velocity: 98.10 cm/s MV A velocity: 44.60 cm/s  SHUNTS MV E/A ratio:  2.20        Systemic Diam: 2.00 cm Oswaldo Milian MD Electronically signed by Oswaldo Milian MD Signature Date/Time: 11/22/2021/2:46:10 PM    Final     Cardiac Studies   11/22/2021  1. Left ventricular ejection fraction, by estimation, is 30 to 35%. The  left ventricle has moderately decreased function. The left ventricle  demonstrates global hypokinesis. There is moderate left ventricular  hypertrophy. Left ventricular diastolic  parameters are consistent with Grade III diastolic dysfunction  (restrictive). Elevated left atrial pressure.   2. Right ventricular systolic function is normal. The right ventricular  size is normal. There is moderately elevated pulmonary artery systolic  pressure. The estimated right ventricular systolic pressure is 94.7 mmHg.   3. Left atrial size was severely dilated.   4. The mitral valve is normal in structure. Moderate mitral valve  regurgitation. Appears functional.   5. The aortic valve is tricuspid. Aortic valve regurgitation is mild.  Aortic valve sclerosis/calcification is present, without any evidence of  aortic stenosis.   6. The inferior vena cava is dilated in size with <50% respiratory   variability, suggesting right atrial pressure of 15 mmHg.    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 Darrick Meigs. Currently managing decompensated ischemic CM EF 30-35% with flash pulmonary edema in the setting of PNA, NSTEMI  Assessment & Plan    Non-STEMI with known CAD: Had sudden onset chest pain and pressure 12/1, Currently pain-free on IV heparin and nitroglycerin drip. EKG showed anterior TWI. Had significant delta troponin. He has known LAD dx. He was felt to be a CABg candidate in February. This was discussed with his daughter a cardiac nurse. He was not felt to be a candidate. (See consult note for full details). Considering he was on BIPAP with PNA in respiratory distress, a LHC would have been challenging so I opted to treat NSTEMI medically.  -loaded with plavix 300 mg x1  - loaded with ASA 325mg x1  -heparin gtt (plan for 48 hours)  -cont asa 81 mg daily  -cont  plavix 75 mg daily  -cont atorvastatin 80 mg daily  -defer LHC until more clinically stable, possible Mon/Tue   2. Decompensated Systolic HF: ischemic CM.  He is wet and warm. He has JVD but VO not too significant. He had flash pulm edema in the setting of PNA. He is now s/p BIPAP. Will continue to diurese - net negative 859 - cont nitro gtt for afterload reduction - goal net neg up to 1L - re-dose lasix 80 mg IV today (from 40). Added potassium  3. PNA: leukocytosis improving -per primary team -started ceftriazone  4. Anemia: hgb 11->8 - anemia w/u      For questions or updates, please contact [place] Please consult [email or web address] for contact info under        Signed, Janina Mayo, MD  11/23/2021, 8:08 AM

## 2021-11-24 DIAGNOSIS — I255 Ischemic cardiomyopathy: Secondary | ICD-10-CM | POA: Diagnosis not present

## 2021-11-24 DIAGNOSIS — J9601 Acute respiratory failure with hypoxia: Secondary | ICD-10-CM | POA: Diagnosis not present

## 2021-11-24 DIAGNOSIS — J9602 Acute respiratory failure with hypercapnia: Secondary | ICD-10-CM | POA: Diagnosis not present

## 2021-11-24 DIAGNOSIS — I214 Non-ST elevation (NSTEMI) myocardial infarction: Secondary | ICD-10-CM | POA: Diagnosis not present

## 2021-11-24 LAB — BASIC METABOLIC PANEL
Anion gap: 11 (ref 5–15)
BUN: 40 mg/dL — ABNORMAL HIGH (ref 8–23)
CO2: 21 mmol/L — ABNORMAL LOW (ref 22–32)
Calcium: 8.4 mg/dL — ABNORMAL LOW (ref 8.9–10.3)
Chloride: 100 mmol/L (ref 98–111)
Creatinine, Ser: 1.71 mg/dL — ABNORMAL HIGH (ref 0.61–1.24)
GFR, Estimated: 39 mL/min — ABNORMAL LOW (ref >60)
Glucose, Bld: 240 mg/dL — ABNORMAL HIGH (ref 70–99)
Potassium: 4.7 mmol/L (ref 3.5–5.1)
Sodium: 132 mmol/L — ABNORMAL LOW (ref 135–145)

## 2021-11-24 LAB — CBC
HCT: 28.5 % — ABNORMAL LOW (ref 39.0–52.0)
Hemoglobin: 8.6 g/dL — ABNORMAL LOW (ref 13.0–17.0)
MCH: 21.9 pg — ABNORMAL LOW (ref 26.0–34.0)
MCHC: 30.2 g/dL (ref 30.0–36.0)
MCV: 72.7 fL — ABNORMAL LOW (ref 80.0–100.0)
Platelets: 562 10*3/uL — ABNORMAL HIGH (ref 150–400)
RBC: 3.92 MIL/uL — ABNORMAL LOW (ref 4.22–5.81)
RDW: 20.5 % — ABNORMAL HIGH (ref 11.5–15.5)
WBC: 10.2 10*3/uL (ref 4.0–10.5)
nRBC: 0 % (ref 0.0–0.2)

## 2021-11-24 LAB — GLUCOSE, CAPILLARY
Glucose-Capillary: 170 mg/dL — ABNORMAL HIGH (ref 70–99)
Glucose-Capillary: 136 mg/dL — ABNORMAL HIGH (ref 70–99)
Glucose-Capillary: 106 mg/dL — ABNORMAL HIGH (ref 70–99)

## 2021-11-24 LAB — HEPARIN LEVEL (UNFRACTIONATED): Heparin Unfractionated: 0.44 IU/mL (ref 0.30–0.70)

## 2021-11-24 MED ORDER — ENOXAPARIN SODIUM 30 MG/0.3ML IJ SOSY
30.0000 mg | PREFILLED_SYRINGE | INTRAMUSCULAR | Status: DC
  Administered 2021-11-24 – 2021-11-27 (×2): 30 mg via SUBCUTANEOUS
  Filled 2021-11-24 (×2): qty 0.3

## 2021-11-24 MED ORDER — HYDRALAZINE HCL 20 MG/ML IJ SOLN: 10.0000 mg | Freq: Three times a day (TID) | INTRAMUSCULAR | Status: DC | PRN

## 2021-11-24 NOTE — Progress Notes (Signed)
Pt. Refused blood sugar checked. After educating the pt he still refused.

## 2021-11-24 NOTE — Plan of Care (Signed)

## 2021-11-24 NOTE — Plan of Care (Signed)
  Problem: Nutrition: Goal: Adequate nutrition will be maintained Outcome: Progressing   Problem: Elimination: Goal: Will not experience complications related to bowel motility Outcome: Progressing Goal: Will not experience complications related to urinary retention Outcome: Progressing   Problem: Safety: Goal: Ability to remain free from injury will improve Outcome: Progressing   Problem: Skin Integrity: Goal: Risk for impaired skin integrity will decrease Outcome: Progressing   Problem: Education: Goal: Knowledge of General Education information will improve Description: Including pain rating scale, medication(s)/side effects and non-pharmacologic comfort measures Outcome: Not Progressing   Problem: Health Behavior/Discharge Planning: Goal: Ability to manage health-related needs will improve Outcome: Not Progressing   Problem: Clinical Measurements: Goal: Ability to maintain clinical measurements within normal limits will improve Outcome: Not Progressing Goal: Will remain free from infection Outcome: Not Progressing Goal: Diagnostic test results will improve Outcome: Not Progressing Goal: Respiratory complications will improve Outcome: Not Progressing Goal: Cardiovascular complication will be avoided Outcome: Not Progressing   Problem: Activity: Goal: Risk for activity intolerance will decrease Outcome: Not Progressing   Problem: Coping: Goal: Level of anxiety will decrease Outcome: Not Progressing   Problem: Pain Managment: Goal: General experience of comfort will improve Outcome: Not Progressing

## 2021-11-24 NOTE — Progress Notes (Signed)
ANTICOAGULATION CONSULT NOTE  Pharmacy Consult for Heparin Indication: chest pain/ACS  No Known Allergies  Patient Measurements: Weight: 57.2 kg (126 lb 1.7 oz) Heparin Dosing Weight: 56.2 kg  Vital Signs: Temp: 98.5 F (36.9 C) (12/04 0348) Temp Source: Oral (12/04 0348) BP: 122/66 (12/04 0348) Pulse Rate: 75 (12/04 0348)  Labs: Recent Labs    11/21/21 1945 11/21/21 1945 11/21/21 2006 11/21/21 2215 11/21/21 2315 11/22/21 0600 11/22/21 0628 11/22/21 1534 11/23/21 0700 11/23/21 1431 11/24/21 0206  HGB 11.1*  --    < >  --   --  10.3* 11.6*  --  8.6*  --  8.6*  HCT 41.1  --    < >  --   --  36.8* 34.0*  --  29.8*  --  28.5*  PLT 765*  --   --   --   --  616*  --   --  622*  --  562*  LABPROT  --   --   --   --  13.5  --   --   --   --   --   --   INR  --   --   --   --  1.0  --   --   --   --   --   --   HEPARINUNFRC  --    < >  --   --   --  0.27*  --  0.54 0.51  --  0.44  CREATININE 2.06*  --    < >  --   --  1.68*  --   --  1.63* 1.81*  --   TROPONINIHS 121*  --   --  449*  --  1,527*  --   --   --   --   --    < > = values in this interval not displayed.     CrCl cannot be calculated (Unknown ideal weight.).  Assessment: 49 yom presenting with SOB. Heparin per pharmacy consult placed for chest pain/ACS.  Heparin level therapeutic, CBC stable. Treating NSTEMI medically.  Goal of Therapy:  Heparin level 0.3-0.7 units/ml Monitor platelets by anticoagulation protocol: Yes   Plan:  Continue heparin gtt at 1000 units/hr Daily heparin level and CBC  Arrie Senate, PharmD, [place], [facility] Clinical Pharmacist [phone] Please check AMION for all [facility] Pharmacy numbers 11/24/2021

## 2021-11-24 NOTE — Progress Notes (Signed)
PROGRESS NOTE    Randy [name]  HDQ:[id number] DOB: 1941/08/15 DOA: 11/21/2021 PCP: Jilda Panda, MD   Brief Narrative:   80 years old male with unclear medical history came with a chief complaint of shortness of breath.  In the emergency room patient was found to be tachypneic started on BiPAP.  His troponin went up to 1500 and BNP 2904 started on heparin drip and nitroglycerin drip.  Cardiology was consulted   Assessment & Plan: Acute respiratory failure with hypoxia/RESOLVED  PT on RA 94% Secondary to congestive heart failure questionable pneumonia Improved change from BiPAP to nasal cannula now RA Resume Lasix 80 mg   Non-STEMI Patient on oxygen nasal cannula now on room air Nasal cannula irritated his nose very small bleeding Echocardiogram moderate reduced ejection fraction to 35 to 40% cardiac output index 6.05, 3.2 Mild secondary pulmonary hypertension with pulmonary artery pressure of 26 and left ventricular end-diastolic pressure of 11-94 Plan to treat non-STEMI medically Patient loaded with Plavix 300 mg then Plavix 75 mg Aspirin 81 mg daily Lipitor 80 mg daily Heparin drip d/c start on Lovenox coreg      -considering patient and family had discussion about CABG and decided not to proceed in Feb, and he is now stable. It is reasonable to defer ischemic w/u  Acute congestive heart failure with low ejection fraction Ischemic Is negative a 859 cc on Lasix 80 mg IV daily Nitroglycerin drips d/c Goal to get negative after 1 L coreg   PNA Leukocytosis improving Rocephin totally for 5 days   Anemia Check H&H hemoglobin dropped from 11-8    Principal Problem:   Acute respiratory failure with hypoxia and hypercapnia (HCC) Active Problems:   SOB (shortness of breath)   Elevated troponin   Hyperglycemia   Blood creatinine increased compared with prior measurement      DVT prophylaxis: (Lovenox/  Code Status: (Full/ Family Communication: (no family  member Disposition Plan: Reevaluate next week need discussed with the family plan of discharge probably cannot function alone needs placement temporarily   Consultants:  Cardiology   Antibiotics Rocephin total day for 5 days   Subjective: No distress  Objective: Vitals:   11/23/21 2325 11/24/21 0348 11/24/21 0658 11/24/21 0742  BP: (!) 113/44 122/66 122/76 124/65  Pulse: 77 75 71 75  Resp: 20 18  18   Temp: 98.6 F (37 C) 98.5 F (36.9 C)  97.8 F (36.6 C)  TempSrc: Oral Oral  Oral  SpO2: 98% 99%  97%  Weight:  57.2 kg      Intake/Output Summary (Last 24 hours) at 11/24/2021 1053 Last data filed at 11/24/2021 0828 Gross per 24 hour  Intake 1040 ml  Output 1900 ml  Net -860 ml   Filed Weights   11/23/21 0640 11/24/21 0348  Weight: 58.2 kg 57.2 kg    Examination:  General exam: Appears calm and comfortable  Respiratory system: Clear to auscultation. Respiratory effort normal. Cardiovascular system: S1 & S2 heard, RRR. No JVD, murmurs, rubs, gallops or clicks. No pedal edema. Gastrointestinal system: Abdomen is nondistended, soft and nontender. No organomegaly or masses felt. Normal bowel sounds heard. Central nervous system: Alert and oriented. No focal neurological deficits. Extremities: Symmetric 5 x 5 power. Skin: No rashes, lesions or ulcers Psychiatry: Judgement and insight appear normal. Mood & affect appropriate.     Data Reviewed: I have personally reviewed following labs and imaging studies  CBC: Recent Labs  Lab 11/21/21 1945 11/21/21 2006 11/22/21 0600 11/22/21 1740  11/23/21 0700 11/24/21 0206  WBC 19.4*  --  17.8*  --  11.4* 10.2  NEUTROABS 11.7*  --  14.2*  --   --   --   HGB 11.1* 14.3  13.9 10.3* 11.6* 8.6* 8.6*  HCT 41.1 42.0  41.0 36.8* 34.0* 29.8* 28.5*  MCV 77.8*  --  76.3*  --  73.2* 72.7*  PLT 765*  --  616*  --  622* 644*   Basic Metabolic Panel: Recent Labs  Lab 11/21/21 1945 11/21/21 2006 11/22/21 0600 11/22/21 0628  11/23/21 0700 11/23/21 1431 11/24/21 0749  NA 134* 136  137 139 138 135 134* 132*  K 4.1 4.2  4.3 4.3 4.3 4.3 4.1 4.7  CL 97* 100 106  --  103 98 100  CO2 22  --  24  --  24 26 21*  GLUCOSE 411* 410* 139*  --  152* 169* 240*  BUN 58* 65* 57*  --  42* 42* 40*  CREATININE 2.06* 2.00* 1.68*  --  1.63* 1.81* 1.71*  CALCIUM 8.5*  --  8.5*  --  8.2* 8.3* 8.4*  MG  --   --  2.2  --   --   --   --   PHOS  --   --  4.4  --   --   --   --    GFR: CrCl cannot be calculated (Unknown ideal weight.). Liver Function Tests: Recent Labs  Lab 11/22/21 0600 11/23/21 1431  AST 46* 42*  ALT 48* 44  ALKPHOS 88 85  BILITOT 0.5 0.6  PROT 6.6 6.7  ALBUMIN 3.0* 3.1*   No results for input(s): LIPASE, AMYLASE in the last 168 hours. No results for input(s): AMMONIA in the last 168 hours. Coagulation Profile: Recent Labs  Lab 11/21/21 2315  INR 1.0   Cardiac Enzymes: No results for input(s): CKTOTAL, CKMB, CKMBINDEX, TROPONINI in the last 168 hours. BNP (last 3 results) No results for input(s): PROBNP in the last 8760 hours. HbA1C: Recent Labs    11/22/21 0600  HGBA1C 7.2*   CBG: Recent Labs  Lab 11/23/21 0631 11/23/21 1116 11/23/21 1633 11/23/21 2132 11/24/21 0627  GLUCAP 144* 225* 172* 230* 170*   Lipid Profile: Recent Labs    11/23/21 0700  CHOL 134  HDL 60  LDLCALC 60  TRIG 71  CHOLHDL 2.2   Thyroid Function Tests: No results for input(s): TSH, T4TOTAL, FREET4, T3FREE, THYROIDAB in the last 72 hours. Anemia Panel: No results for input(s): VITAMINB12, FOLATE, FERRITIN, TIBC, IRON, RETICCTPCT in the last 72 hours. Sepsis Labs: Recent Labs  Lab 11/22/21 0600  PROCALCITON 0.16    No results found for this or any previous visit (from the past 240 hour(s)).       Radiology Studies: ECHOCARDIOGRAM COMPLETE  Result Date: 11/22/2021    ECHOCARDIOGRAM REPORT   Patient Name:   BOYD BUFFALO Date of Exam: 11/22/2021 Medical Rec #:  [id number]          Height:  Accession #:    [id number]         Weight: Date of Birth:  August 06, 1941           BSA: Patient Age:    65 years           BP:           127/71 mmHg Patient Gender: M                  HR:  73 bpm. Exam Location:  Inpatient Procedure: 2D Echo, Cardiac Doppler and Color Doppler Indications:    CHF  History:        Patient has no prior history of Echocardiogram examinations.                 Signs/Symptoms:Shortness of Breath.  Sonographer:    Glo Herring Referring Phys: [id number] [place]  1. Left ventricular ejection fraction, by estimation, is 30 to 35%. The left ventricle has moderately decreased function. The left ventricle demonstrates global hypokinesis. There is moderate left ventricular hypertrophy. Left ventricular diastolic parameters are consistent with Grade III diastolic dysfunction (restrictive). Elevated left atrial pressure.  2. Right ventricular systolic function is normal. The right ventricular size is normal. There is moderately elevated pulmonary artery systolic pressure. The estimated right ventricular systolic pressure is 13.2 mmHg.  3. Left atrial size was severely dilated.  4. The mitral valve is normal in structure. Moderate mitral valve regurgitation. Appears functional.  5. The aortic valve is tricuspid. Aortic valve regurgitation is mild. Aortic valve sclerosis/calcification is present, without any evidence of aortic stenosis.  6. The inferior vena cava is dilated in size with <50% respiratory variability, suggesting right atrial pressure of 15 mmHg. FINDINGS  Left Ventricle: Left ventricular ejection fraction, by estimation, is 30 to 35%. The left ventricle has moderately decreased function. The left ventricle demonstrates global hypokinesis. The left ventricular internal cavity size was normal in size. There is moderate left ventricular hypertrophy. Left ventricular diastolic parameters are consistent with Grade III diastolic dysfunction (restrictive). Elevated  left atrial pressure. Right Ventricle: The right ventricular size is normal. No increase in right ventricular wall thickness. Right ventricular systolic function is normal. There is moderately elevated pulmonary artery systolic pressure. The tricuspid regurgitant velocity is 3.04 m/s, and with an assumed right atrial pressure of 15 mmHg, the estimated right ventricular systolic pressure is 44.0 mmHg. Left Atrium: Left atrial size was severely dilated. Right Atrium: Right atrial size was normal in size. Pericardium: Trivial pericardial effusion is present. Mitral Valve: The mitral valve is normal in structure. Moderate mitral valve regurgitation. Tricuspid Valve: The tricuspid valve is normal in structure. Tricuspid valve regurgitation is trivial. Aortic Valve: The aortic valve is tricuspid. Aortic valve regurgitation is mild. Aortic valve sclerosis/calcification is present, without any evidence of aortic stenosis. Pulmonic Valve: The pulmonic valve was not well visualized. Pulmonic valve regurgitation is trivial. Aorta: The aortic root and ascending aorta are structurally normal, with no evidence of dilitation. Venous: The inferior vena cava is dilated in size with less than 50% respiratory variability, suggesting right atrial pressure of 15 mmHg. IAS/Shunts: The interatrial septum was not well visualized.  LEFT VENTRICLE PLAX 2D LVIDd:         4.90 cm      Diastology LVIDs:         4.20 cm      LV e' medial:    3.15 cm/s LV PW:         1.40 cm      LV E/e' medial:  31.1 LV IVS:        1.40 cm      LV e' lateral:   9.57 cm/s LVOT diam:     2.00 cm      LV E/e' lateral: 10.3 LVOT Area:     3.14 cm  LV Volumes (MOD) LV vol d, MOD A2C: 149.0 ml LV vol d, MOD A4C: 162.0 ml LV vol s, MOD A2C:  100.0 ml LV vol s, MOD A4C: 108.0 ml LV SV MOD A2C:     49.0 ml LV SV MOD A4C:     162.0 ml LV SV MOD BP:      49.3 ml RIGHT VENTRICLE             IVC RV Basal diam:  3.50 cm     IVC diam: 2.60 cm RV S prime:     15.10 cm/s LEFT  ATRIUM             RIGHT ATRIUM LA diam:        4.90 cm RA Area:     18.80 cm LA Vol (A2C):   91.7 ml RA Volume:   48.70 ml LA Vol (A4C):   87.6 ml LA Biplane Vol: 91.8 ml                        PULMONIC VALVE AORTA                 PV Vmax:       0.92 m/s Ao Root diam: 3.40 cm PV Peak grad:  3.4 mmHg Ao Asc diam:  3.10 cm  MITRAL VALVE               TRICUSPID VALVE MV Area (PHT): 4.63 cm    TR Peak grad:   37.0 mmHg MV Decel Time: 164 msec    TR Vmax:        304.00 cm/s MV E velocity: 98.10 cm/s MV A velocity: 44.60 cm/s  SHUNTS MV E/A ratio:  2.20        Systemic Diam: 2.00 cm Oswaldo Milian MD Electronically signed by Oswaldo Milian MD Signature Date/Time: 11/22/2021/2:46:10 PM    Final         Scheduled Meds:  aspirin EC  81 mg Oral Daily   atorvastatin  80 mg Oral Daily   carvedilol  3.125 mg Oral BID WC   clopidogrel  75 mg Oral Daily   enoxaparin (LOVENOX) injection  30 mg Subcutaneous Q24H   insulin aspart  0-6 Units Subcutaneous TID WC   potassium chloride  40 mEq Oral BID   Continuous Infusions:  azithromycin 500 mg (11/23/21 1556)   cefTRIAXone (ROCEPHIN)  IV 2 g (11/23/21 1522)     LOS: 3 days    Time spent: More than 35 minutes    Rickardo Brinegar G Jashira Cotugno, MD [facility]   If 7PM-7AM, please contact night-coverage [email or web address] Password [email or web address] 11/24/2021, 10:53 AM

## 2021-11-24 NOTE — Progress Notes (Signed)
Progress Note  Patient Name: Randy [name] Date of Encounter: 11/24/2021  [facility] Cardiologist: None   Subjective   Randy [name] is awake and alert. He is watching TV. He is comfotable   NSR Normal blood pressures on nitro gtt On RA; off BIPAP still Continued on antibiotics  Net negative 660 lasix 80IV  Crt 1.68->1.63-> 1.81 Troponin 121->-449->1527  WBC 17-> 10 started on ceftriaxone Hgb 11->8.6 stable  Of note, chart not merged initially and data may be missing  Inpatient Medications    Scheduled Meds:  aspirin EC  81 mg Oral Daily   atorvastatin  80 mg Oral Daily   carvedilol  3.125 mg Oral BID WC   clopidogrel  75 mg Oral Daily   insulin aspart  0-6 Units Subcutaneous TID WC   potassium chloride  40 mEq Oral BID   Continuous Infusions:  azithromycin 500 mg (11/23/21 1556)   cefTRIAXone (ROCEPHIN)  IV 2 g (11/23/21 1522)   heparin 1,000 Units/hr (11/24/21 0410)   nitroGLYCERIN 12.5 mcg/min (11/24/21 0719)   PRN Meds: acetaminophen **OR** acetaminophen   Vital Signs    Vitals:   11/23/21 2325 11/24/21 0348 11/24/21 0658 11/24/21 0742  BP: (!) 113/44 122/66 122/76 124/65  Pulse: 77 75 71 75  Resp: 20 18  18   Temp: 98.6 F (37 C) 98.5 F (36.9 C)  97.8 F (36.6 C)  TempSrc: Oral Oral  Oral  SpO2: 98% 99%  97%  Weight:  57.2 kg      Intake/Output Summary (Last 24 hours) at 11/24/2021 0818 Last data filed at 11/24/2021 0700 Gross per 24 hour  Intake 800 ml  Output 1700 ml  Net -900 ml   Last 3 Weights 11/24/2021 11/23/2021  Weight (lbs) 126 lb 1.7 oz 128 lb 4.9 oz  Weight (kg) 57.2 kg 58.2 kg      Telemetry    NSR - Personally Reviewed  ECG    NA - Personally Reviewed  Physical Exam   Vitals:   11/24/21 0658 11/24/21 0742  BP: 122/76 124/65  Pulse: 71 75  Resp:  18  Temp:  97.8 F (36.6 C)  SpO2:  97%     GEN: awake and alert Neck: no JVD  Cardiac: RRR, no murmurs, rubs, or gallops.  Respiratory: nl wob,  coarse BS,  GI: Soft, nontender, non-distended  MS: No edema; No deformity. Neuro:  Nonfocal  Skin: warm and well perfused Psych: Normal affect   Labs    High Sensitivity Troponin:   Recent Labs  Lab 11/21/21 1945 11/21/21 2215 11/22/21 0600  TROPONINIHS 121* 449* 1,527*     Chemistry Recent Labs  Lab 11/22/21 0600 11/22/21 0628 11/23/21 0700 11/23/21 1431  NA 139 138 135 134*  K 4.3 4.3 4.3 4.1  CL 106  --  103 98  CO2 24  --  24 26  GLUCOSE 139*  --  152* 169*  BUN 57*  --  42* 42*  CREATININE 1.68*  --  1.63* 1.81*  CALCIUM 8.5*  --  8.2* 8.3*  MG 2.2  --   --   --   PROT 6.6  --   --  6.7  ALBUMIN 3.0*  --   --  3.1*  AST 46*  --   --  42*  ALT 48*  --   --  44  ALKPHOS 88  --   --  85  BILITOT 0.5  --   --  0.6  GFRNONAA 40*  --  42* 37*  ANIONGAP 9  --  8 10    Lipids  Recent Labs  Lab 11/23/21 0700  CHOL 134  TRIG 71  HDL 60  LDLCALC 60  CHOLHDL 2.2    Hematology Recent Labs  Lab 11/22/21 0600 11/22/21 0628 11/23/21 0700 11/24/21 0206  WBC 17.8*  --  11.4* 10.2  RBC 4.82  --  4.07* 3.92*  HGB 10.3* 11.6* 8.6* 8.6*  HCT 36.8* 34.0* 29.8* 28.5*  MCV 76.3*  --  73.2* 72.7*  MCH 21.4*  --  21.1* 21.9*  MCHC 28.0*  --  28.9* 30.2  RDW 19.6*  --  19.8* 20.5*  PLT 616*  --  622* 562*   Thyroid No results for input(s): TSH, FREET4 in the last 168 hours.  BNP Recent Labs  Lab 11/22/21 0600  BNP 2,954.8*    DDimer No results for input(s): DDIMER in the last 168 hours.   Radiology    ECHOCARDIOGRAM COMPLETE  Result Date: 11/22/2021    ECHOCARDIOGRAM REPORT   Patient Name:   Randy [name] Date of Exam: 11/22/2021 Medical Rec #:  [id number]          Height: Accession #:    [id number]         Weight: Date of Birth:  Sep 11, 1941           BSA: Patient Age:    80 years           BP:           127/71 mmHg Patient Gender: M                  HR:           73 bpm. Exam Location:  Inpatient Procedure: 2D Echo, Cardiac Doppler and Color Doppler  Indications:    CHF  History:        Patient has no prior history of Echocardiogram examinations.                 Signs/Symptoms:Shortness of Breath.  Sonographer:    Glo Herring Referring Phys: [id number] [place]  1. Left ventricular ejection fraction, by estimation, is 30 to 35%. The left ventricle has moderately decreased function. The left ventricle demonstrates global hypokinesis. There is moderate left ventricular hypertrophy. Left ventricular diastolic parameters are consistent with Grade III diastolic dysfunction (restrictive). Elevated left atrial pressure.  2. Right ventricular systolic function is normal. The right ventricular size is normal. There is moderately elevated pulmonary artery systolic pressure. The estimated right ventricular systolic pressure is 52.7 mmHg.  3. Left atrial size was severely dilated.  4. The mitral valve is normal in structure. Moderate mitral valve regurgitation. Appears functional.  5. The aortic valve is tricuspid. Aortic valve regurgitation is mild. Aortic valve sclerosis/calcification is present, without any evidence of aortic stenosis.  6. The inferior vena cava is dilated in size with <50% respiratory variability, suggesting right atrial pressure of 15 mmHg. FINDINGS  Left Ventricle: Left ventricular ejection fraction, by estimation, is 30 to 35%. The left ventricle has moderately decreased function. The left ventricle demonstrates global hypokinesis. The left ventricular internal cavity size was normal in size. There is moderate left ventricular hypertrophy. Left ventricular diastolic parameters are consistent with Grade III diastolic dysfunction (restrictive). Elevated left atrial pressure. Right Ventricle: The right ventricular size is normal. No increase in right ventricular wall thickness. Right ventricular systolic function is normal. There is moderately elevated pulmonary artery systolic pressure. The tricuspid  regurgitant velocity is 3.04  m/s, and with an assumed right atrial pressure of 15 mmHg, the estimated right ventricular systolic pressure is 09.7 mmHg. Left Atrium: Left atrial size was severely dilated. Right Atrium: Right atrial size was normal in size. Pericardium: Trivial pericardial effusion is present. Mitral Valve: The mitral valve is normal in structure. Moderate mitral valve regurgitation. Tricuspid Valve: The tricuspid valve is normal in structure. Tricuspid valve regurgitation is trivial. Aortic Valve: The aortic valve is tricuspid. Aortic valve regurgitation is mild. Aortic valve sclerosis/calcification is present, without any evidence of aortic stenosis. Pulmonic Valve: The pulmonic valve was not well visualized. Pulmonic valve regurgitation is trivial. Aorta: The aortic root and ascending aorta are structurally normal, with no evidence of dilitation. Venous: The inferior vena cava is dilated in size with less than 50% respiratory variability, suggesting right atrial pressure of 15 mmHg. IAS/Shunts: The interatrial septum was not well visualized.  LEFT VENTRICLE PLAX 2D LVIDd:         4.90 cm      Diastology LVIDs:         4.20 cm      LV e' medial:    3.15 cm/s LV PW:         1.40 cm      LV E/e' medial:  31.1 LV IVS:        1.40 cm      LV e' lateral:   9.57 cm/s LVOT diam:     2.00 cm      LV E/e' lateral: 10.3 LVOT Area:     3.14 cm  LV Volumes (MOD) LV vol d, MOD A2C: 149.0 ml LV vol d, MOD A4C: 162.0 ml LV vol s, MOD A2C: 100.0 ml LV vol s, MOD A4C: 108.0 ml LV SV MOD A2C:     49.0 ml LV SV MOD A4C:     162.0 ml LV SV MOD BP:      49.3 ml RIGHT VENTRICLE             IVC RV Basal diam:  3.50 cm     IVC diam: 2.60 cm RV S prime:     15.10 cm/s LEFT ATRIUM             RIGHT ATRIUM LA diam:        4.90 cm RA Area:     18.80 cm LA Vol (A2C):   91.7 ml RA Volume:   48.70 ml LA Vol (A4C):   87.6 ml LA Biplane Vol: 91.8 ml                        PULMONIC VALVE AORTA                 PV Vmax:       0.92 m/s Ao Root diam: 3.40 cm PV  Peak grad:  3.4 mmHg Ao Asc diam:  3.10 cm  MITRAL VALVE               TRICUSPID VALVE MV Area (PHT): 4.63 cm    TR Peak grad:   37.0 mmHg MV Decel Time: 164 msec    TR Vmax:        304.00 cm/s MV E velocity: 98.10 cm/s MV A velocity: 44.60 cm/s  SHUNTS MV E/A ratio:  2.20        Systemic Diam: 2.00 cm Oswaldo Milian MD Electronically signed by Oswaldo Milian MD Signature Date/Time: 11/22/2021/2:46:10 PM  Final     Cardiac Studies   11/22/2021  1. Left ventricular ejection fraction, by estimation, is 30 to 35%. The  left ventricle has moderately decreased function. The left ventricle  demonstrates global hypokinesis. There is moderate left ventricular  hypertrophy. Left ventricular diastolic  parameters are consistent with Grade III diastolic dysfunction  (restrictive). Elevated left atrial pressure.   2. Right ventricular systolic function is normal. The right ventricular  size is normal. There is moderately elevated pulmonary artery systolic  pressure. The estimated right ventricular systolic pressure is 08.6 mmHg.   3. Left atrial size was severely dilated.   4. The mitral valve is normal in structure. Moderate mitral valve  regurgitation. Appears functional.   5. The aortic valve is tricuspid. Aortic valve regurgitation is mild.  Aortic valve sclerosis/calcification is present, without any evidence of  aortic stenosis.   6. The inferior vena cava is dilated in size with <50% respiratory  variability, suggesting right atrial pressure of 15 mmHg.    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   Moderately reduced LVEF 35 to 40% by Echocardiogram; Cardiac  Output-Index (Fick) 6.05, 3.29 Mild Secondary Pulmonary Hypertension with mean PA P of ~26 mmHg, and PCWP/LVEDP of 18-20  Patient Profile     Randy [name] is a 80 y.o. 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 Darrick Meigs. Currently managing decompensated ischemic CM EF 30-35% with flash pulmonary edema in the setting of PNA, NSTEMI  Assessment & Plan    Non-STEMI with known CAD: Had sudden onset chest pain and pressure 12/1, Currently pain-free on IV heparin and nitroglycerin drip. EKG showed anterior TWI. Had significant delta troponin. He has known LAD dx. He was felt to be a CABg candidate in February. This was discussed with his daughter a cardiac nurse. He was not felt to be a candidate. (See consult note for full details). Considering he was on BIPAP with PNA in respiratory distress, a LHC would have been challenging so I opted to treat NSTEMI medically.  -loaded with plavix 300 mg x1  - loaded with ASA 325mg x1  -heparin gtt (plan for 48 hours); stopped today (added SCDs for prophylaxis, can change per primary team)  -cont asa 81 mg daily  -cont plavix 75 mg daily  -cont atorvastatin 80 mg daily  -considering patient and family had discussion about CABG and decided not to proceed in Feb, and he is now stable. It is reasonable to defer ischemic w/u   2. Decompensated Systolic HF: ischemic CM. He is wet and warm. He has JVD but VO not too significant. He had flash pulm edema in the setting of PNA. He is now s/p BIPAP. Will continue to diurese. He is euvolemic, has bump in renal fxn, will stop diuresis - net negative 660 - he has diuresed and blood pressures are now normal, I stopped  the nitro gtt -can continue  home coreg today - home bidil held, on IV hydralazine PRN   3. PNA: leukocytosis improving -per primary team -on ceftriazone  4. Anemia: hgb 11->8. stable - anemia w/u      For questions or updates, please contact [place] Please consult [email or web address] for contact info under        Signed, Janina Mayo, MD  11/24/2021, 8:18 AM

## 2021-11-25 DIAGNOSIS — J9601 Acute respiratory failure with hypoxia: Secondary | ICD-10-CM | POA: Diagnosis not present

## 2021-11-25 DIAGNOSIS — R7989 Other specified abnormal findings of blood chemistry: Secondary | ICD-10-CM | POA: Diagnosis not present

## 2021-11-25 DIAGNOSIS — R739 Hyperglycemia, unspecified: Secondary | ICD-10-CM | POA: Diagnosis not present

## 2021-11-25 DIAGNOSIS — I5043 Acute on chronic combined systolic (congestive) and diastolic (congestive) heart failure: Secondary | ICD-10-CM

## 2021-11-25 DIAGNOSIS — J9602 Acute respiratory failure with hypercapnia: Secondary | ICD-10-CM | POA: Diagnosis not present

## 2021-11-25 DIAGNOSIS — R778 Other specified abnormalities of plasma proteins: Secondary | ICD-10-CM | POA: Diagnosis not present

## 2021-11-25 DIAGNOSIS — K921 Melena: Secondary | ICD-10-CM

## 2021-11-25 DIAGNOSIS — D509 Iron deficiency anemia, unspecified: Secondary | ICD-10-CM

## 2021-11-25 LAB — BASIC METABOLIC PANEL
Anion gap: 10 (ref 5–15)
BUN: 42 mg/dL — ABNORMAL HIGH (ref 8–23)
CO2: 22 mmol/L (ref 22–32)
Calcium: 8.6 mg/dL — ABNORMAL LOW (ref 8.9–10.3)
Chloride: 101 mmol/L (ref 98–111)
Creatinine, Ser: 2.06 mg/dL — ABNORMAL HIGH (ref 0.61–1.24)
GFR, Estimated: 32 mL/min — ABNORMAL LOW (ref >60)
Glucose, Bld: 129 mg/dL — ABNORMAL HIGH (ref 70–99)
Potassium: 4.6 mmol/L (ref 3.5–5.1)
Sodium: 133 mmol/L — ABNORMAL LOW (ref 135–145)

## 2021-11-25 LAB — GLUCOSE, CAPILLARY
Glucose-Capillary: 151 mg/dL — ABNORMAL HIGH (ref 70–99)
Glucose-Capillary: 129 mg/dL — ABNORMAL HIGH (ref 70–99)
Glucose-Capillary: 127 mg/dL — ABNORMAL HIGH (ref 70–99)
Glucose-Capillary: 189 mg/dL — ABNORMAL HIGH (ref 70–99)

## 2021-11-25 LAB — CBC
HCT: 29.4 % — ABNORMAL LOW (ref 39.0–52.0)
Hemoglobin: 8.5 g/dL — ABNORMAL LOW (ref 13.0–17.0)
MCH: 21.5 pg — ABNORMAL LOW (ref 26.0–34.0)
MCHC: 28.9 g/dL — ABNORMAL LOW (ref 30.0–36.0)
MCV: 74.2 fL — ABNORMAL LOW (ref 80.0–100.0)
Platelets: 593 10*3/uL — ABNORMAL HIGH (ref 150–400)
RBC: 3.96 MIL/uL — ABNORMAL LOW (ref 4.22–5.81)
RDW: 20.9 % — ABNORMAL HIGH (ref 11.5–15.5)
WBC: 9.5 10*3/uL (ref 4.0–10.5)
nRBC: 0 % (ref 0.0–0.2)

## 2021-11-25 LAB — PREPARE RBC (CROSSMATCH)

## 2021-11-25 LAB — ABO/RH: ABO/RH(D): B POS

## 2021-11-25 MED ORDER — PANTOPRAZOLE 80MG IVPB - SIMPLE MED
80.0000 mg | Freq: Once | INTRAVENOUS | Status: AC
  Administered 2021-11-25: 80 mg via INTRAVENOUS
  Filled 2021-11-25: qty 80

## 2021-11-25 MED ORDER — PANTOPRAZOLE INFUSION (NEW) - SIMPLE MED
8.0000 mg/h | INTRAVENOUS | Status: DC
  Administered 2021-11-25 – 2021-11-26 (×3): 8 mg/h via INTRAVENOUS
  Filled 2021-11-25 (×3): qty 80

## 2021-11-25 MED ORDER — SODIUM CHLORIDE 0.9% IV SOLUTION: Freq: Once | INTRAVENOUS | Status: DC

## 2021-11-25 MED ORDER — PANTOPRAZOLE SODIUM 40 MG IV SOLR: 40.0000 mg | Freq: Two times a day (BID) | INTRAVENOUS | Status: DC

## 2021-11-25 NOTE — Progress Notes (Signed)
Progress Note  Patient Name: Randy [name] Date of Encounter: 11/25/2021  Primary Cardiologist: None   Subjective   Patient was seen examined his bedside.  He was sitting up in bed when I arrived.  He does not offer any medical complaints.  He tells me he which she can leave the hospital and go to the ATM.  Inpatient Medications    Scheduled Meds:  aspirin EC  81 mg Oral Daily   atorvastatin  80 mg Oral Daily   carvedilol  3.125 mg Oral BID WC   clopidogrel  75 mg Oral Daily   enoxaparin (LOVENOX) injection  30 mg Subcutaneous Q24H   insulin aspart  0-6 Units Subcutaneous TID WC   [START ON 11/28/2021] pantoprazole  40 mg Intravenous Q12H   Continuous Infusions:  azithromycin 250 mL/hr at 11/24/21 1605   cefTRIAXone (ROCEPHIN)  IV 2 g (11/24/21 1529)   pantoprazole     pantoprazole     PRN Meds: acetaminophen **OR** acetaminophen, hydrALAZINE   Vital Signs    Vitals:   11/24/21 1520 11/24/21 1949 11/25/21 0654 11/25/21 0738  BP: 108/60 (!) 102/53 119/68 (!) 115/48  Pulse: 78 71 79   Resp: 20 20    Temp: 98.6 F (37 C) 98.7 F (37.1 C)  97.7 F (36.5 C)  TempSrc: Oral Oral  Oral  SpO2: 98% 93%    Weight:        Intake/Output Summary (Last 24 hours) at 11/25/2021 1101 Last data filed at 11/25/2021 0700 Gross per 24 hour  Intake 720 ml  Output 1450 ml  Net -730 ml   Filed Weights   11/23/21 0640 11/24/21 0348  Weight: 58.2 kg 57.2 kg    Telemetry    Sinus rhythm- Personally Reviewed  ECG    EKG from November 21, 2021 shows sinus rhythm, heart rate 80 bpm with prolonged PR interval and nonspecific interventricular conduction defect- Personally Reviewed  Physical Exam   GEN: No acute distress.   Neck: No JVD Cardiac: RRR, no murmurs, rubs, or gallops.  Respiratory: Clear to auscultation bilaterally. GI: Soft, nontender, non-distended  MS: No edema; No deformity. Neuro:  Nonfocal  Psych: Normal affect   Labs    Chemistry Recent Labs  Lab  11/22/21 0600 11/22/21 0628 11/23/21 1431 11/24/21 0749 11/25/21 0021  NA 139   < > 134* 132* 133*  K 4.3   < > 4.1 4.7 4.6  CL 106   < > 98 100 101  CO2 24   < > 26 21* 22  GLUCOSE 139*   < > 169* 240* 129*  BUN 57*   < > 42* 40* 42*  CREATININE 1.68*   < > 1.81* 1.71* 2.06*  CALCIUM 8.5*   < > 8.3* 8.4* 8.6*  PROT 6.6  --  6.7  --   --   ALBUMIN 3.0*  --  3.1*  --   --   AST 46*  --  42*  --   --   ALT 48*  --  44  --   --   ALKPHOS 88  --  85  --   --   BILITOT 0.5  --  0.6  --   --   GFRNONAA 40*   < > 37* 39* 32*  ANIONGAP 9   < > 10 11 10    < > = values in this interval not displayed.     Hematology Recent Labs  Lab 11/23/21 0700 11/24/21 0206 11/25/21  0021  WBC 11.4* 10.2 9.5  RBC 4.07* 3.92* 3.96*  HGB 8.6* 8.6* 8.5*  HCT 29.8* 28.5* 29.4*  MCV 73.2* 72.7* 74.2*  MCH 21.1* 21.9* 21.5*  MCHC 28.9* 30.2 28.9*  RDW 19.8* 20.5* 20.9*  PLT 622* 562* 593*    Cardiac EnzymesNo results for input(s): TROPONINI in the last 168 hours. No results for input(s): TROPIPOC in the last 168 hours.   BNP Recent Labs  Lab 11/22/21 0600  BNP 2,954.8*     DDimer No results for input(s): DDIMER in the last 168 hours.   Radiology    No results found.  Cardiac Studies   11/22/2021  1. Left ventricular ejection fraction, by estimation, is 30 to 35%. The  left ventricle has moderately decreased function. The left ventricle  demonstrates global hypokinesis. There is moderate left ventricular  hypertrophy. Left ventricular diastolic  parameters are consistent with Grade III diastolic dysfunction  (restrictive). Elevated left atrial pressure.   2. Right ventricular systolic function is normal. The right ventricular  size is normal. There is moderately elevated pulmonary artery systolic  pressure. The estimated right ventricular systolic pressure is 78.5 mmHg.   3. Left atrial size was severely dilated.   4. The mitral valve is normal in structure. Moderate mitral valve   regurgitation. Appears functional.   5. The aortic valve is tricuspid. Aortic valve regurgitation is mild.  Aortic valve sclerosis/calcification is present, without any evidence of  aortic stenosis.   6. The inferior vena cava is dilated in size with <50% respiratory  variability, suggesting right atrial pressure of 15 mmHg.      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 male with a history of HTN, HLD, h/o CKD stage 3-4, gout, ischemic CMP (Multivessel CAD on LHC 01/2021- managed medically, not a candidate for CABG due to calcicifation in the aortic root, ascending aorta, not a candidate for PCI) LVEF 35-40%>> now 25% who is being seen 11/22/2021 for the evaluation of NSTEMI at the request of Dr. Darrick Meigs. Currently managing decompensated ischemic CM EF 30-35% with flash pulmonary edema in the setting of PNA, NSTEMI  Assessment & Plan    Non-STEMI with known coronary artery disease Acute decompensated heart failure with systolic ejection fraction Ischemic cardiomyopathy recent ejection fraction 30-35% on echocardiogram done in November 22, 2021. Pulmonary hypertension Moderate mitral regurgitation Pneumonia Acute respiratory failure with hypoxia-has resolved Anemia   Patient initially presented with chest pain with known coronary artery disease who previously was deemed not a candidate for coronary artery bypass grafting and had been managed medically managed.  During his initial hospitalizations he has  been given IV heparin which he completed adequate treatment of his heparin drip for 48 hours.  He was loaded with aspirin and Plavix which were going to continue his dual antiplatelet therapy with aspirin 81 mg daily and Plavix 75 mg daily.  Continue atorvastatin 80 mg daily.  He was chest pain-free during my encounter.  He is also being  optimized with antianginals continue low-dose beta-blocker but the long-acting nitrate is being held due to low blood pressure.  He also clinically using heart failure and has been treated for while inpatient and believes he is improving.  Clinical exam performed today he is euvolemic.  His diuretics was held yesterday due to increasing creatinine.  Lets continue to hold ACE as his creatinine today went up.  His decompensated heart failure may be multifactorial in the setting of his ischemic cardiomyopathy as well as his mitral regurgitation.  Continue Coreg, BiDil has been held due to his low blood pressure once his blood pressure can tolerate it please restart this medication.  His acute respiratory failure with hypoxia has resolved he is no longer on BiPAP, appears to be doing well on room air with good oxygen saturations.    Acute on chronic kidney injury likely in the setting of his diuretics he is we will continue to monitor.  Suspect acute blood loss anemia -stool occult is pending.  He is currently on dual antiplatelet therapy.-Most recent 48 hours heparin drip.  Continue the Protonix 40 mg twice daily.  If his hemoglobin does not improve and drop tomorrow I would recommend transfusing the patient to keep his hemoglobin above 9 in the setting of his recent NSTEMI.  Pneumonia-antibiotics by the primary team.  For questions or updates, please contact [place] Please consult [email or web address] for contact info under Cardiology/STEMI.      Signed, Adraine Biffle, DO  11/25/2021, 11:01 AM

## 2021-11-25 NOTE — Progress Notes (Signed)
PROGRESS NOTE    Randy [name]  [name] DOB: [birth year] DOA: 11/21/2021 PCP: Randy Panda, MD   Brief Narrative:  The patient is an 80 year old [place] male with a past medical history significant for but not limited to CAD as well as other comorbidities who presented to the hospital on 11/21/2021 with acute hypoxic hypercapnic respiratory failure [facility] [place] [facility].  He presented with acute respiratory failure and shortness of breath and further work-up was done and because he was tachypneic and decompensated started on BiPAP.  Troponin went up to 1500 and BNP was 2100 and started on a heparin drip and nitroglycerin drip.  Cardiology was consulted and he is found have an NSTEMI and was treated medically.  He was status post heparin drip and cardiology followin and he is now on aspirin 81 mg as well as Plavix 75 mg.  The cardiologist had a discussion with the patient About CABG and he decided to proceed back in February and now stable and they are feeling that it is reasonable to defer ischemic work-up at this time.  Given that he had NSTEMI he was also noted to have acute congestive heart failure with low EF and ischemic cardiomyopathy.  He was given IV diuresis with Lasix with improvement.  Subsequently patient was also found to have an acute anemia with his hemoglobin dropping.  He now complains of black stools and he was placed on a Protonix drip and GI has been consulted and planning on EGD in a.m.  Given his low hemoglobin cardiology recommended transfusing for hemoglobin greater than 9 so we will type and screen transfuse PRBCs  Assessment & Plan:   Principal Problem:   Acute respiratory failure with hypoxia and hypercapnia (HCC) Active Problems:   SOB (shortness of breath)   Elevated troponin   Hyperglycemia   Blood creatinine increased compared with prior measurement  Acute respiratory failure with hypoxia and hypercapnia in the setting of acute systolic CHF exacerbation  in the setting of NSTEMI along with questionable pneumonia -Improved -Patient is no longer wearing supplemental oxygen via nasal cannula and is on room air -SpO2: 93 % O2 Flow Rate (L/min): 15 L/min FiO2 (%): 40 % -Continuous pulse oximetry maintain O2 saturation greater 90% -Currently getting antibiotics for suspected pneumonia -He was changed from BiPAP to nasal cannula and now is on room air -Currently getting diuresis with Lasix  NSTEMI in setting of known coronary disease -Treated medically and cardiology was consulted -Initiated on noninvasive positive pressure ventilation with BiPAP and weaned to room air -Echocardiogram done which showed an EF of 35 to 40% cardiac output index was 6.05, 3.2 -He was noted to have mild secondary pulmonary hypertension with pulm arterial pressure of 26 and the ventricular end-diastolic pressure of 81-01 -Cardiology was consulted and the plan was to treat the NSTEMI medically -He was loaded with Plavix 70 mg p.o. daily and then placed on Plavix 75 mg -He was initiated on aspirin 81 mg p.o. daily -Also on Lipitor 80 mg daily -Previously deemed not a candidate for coronary artery bypass grafting -Currently chest pain-free -Continue antianginals and beta-blocker but his long acting nitrate is held due to his low blood pressures SCDs -Because his creatinine is elevated his ARB is being held  Acute systolic and Diastolic CHF Ischemic cardiomyopathy in the setting of known coronary disease -He had flash pulmonary edema on admission and given his elevated creatinine his diuresis has been held -EF was 30-35% with Grade 3 DD -Cardiology following and he  is getting IV Lasix and received IV Lasix 80 mg daily but this is now been held -His nitroglycerin drip has been discontinued -Currently continuing carvedilol; currently getting 3.125 mg of carvedilol twice daily, aspirin 81 mg p.o. daily, atorvastatin 80 minutes daily, clopidogrel 75 mg daily -Strict I's  and O's and daily weights -Continue monitor for signs and symptoms of volume overload  Suspected PNA -WBC is improving -Currently getting antibiotics -Repeat chest x-ray in the a.m.  AKI on CKD stage IIIb -Patient's BUN/creatinine is worsening from 42/1.81 and trended down to 47.71 but now trending back up and went to 42/2.06 and his diuresis is now stopped -Avoid further nephrotoxic medications, contrast dyes, hypotension and dehydration and renally dose medications -Continue to monitor renal function carefully and repeat CBC in a.m.  Acute blood loss anemia with concern for GI bleeding -Has been having dark black stools and abdominal pain -Initiate Protonix drip -Hemoglobin/hematocrit has trended down and went to 8.5/29.4 -GI has been consulted and we will type and screen and transfuse 1 unit of PRBCs given cardiology recommendation given his ischemic cardiomyopathy to improve his hemoglobin greater than 9 -Check FOBT -Currently he is on dual antiplatelet therapy and this cannot be stopped currently unless cleared by cardiology  DVT prophylaxis: SCDs Code Status: Full code Family Communication: No family currently at bedside and attempted to call the Randy [name] with no response and then called Randy [name] the Daughter with no response Disposition Plan: Pending further clinical improvement and clearance by specialists  Status is: Inpatient  Remains inpatient appropriate because: He has been having anemia and dark stools as well as abdominal pain  Consultants:  Cardiology Gastroenterology  Procedures:  ECHOCARDIOGRAM IMPRESSIONS     1. Left ventricular ejection fraction, by estimation, is 30 to 35%. The  left ventricle has moderately decreased function. The left ventricle  demonstrates global hypokinesis. There is moderate left ventricular  hypertrophy. Left ventricular diastolic  parameters are consistent with Grade III diastolic dysfunction  (restrictive). Elevated left atrial  pressure.   2. Right ventricular systolic function is normal. The right ventricular  size is normal. There is moderately elevated pulmonary artery systolic  pressure. The estimated right ventricular systolic pressure is 82.9 mmHg.   3. Left atrial size was severely dilated.   4. The mitral valve is normal in structure. Moderate mitral valve  regurgitation. Appears functional.   5. The aortic valve is tricuspid. Aortic valve regurgitation is mild.  Aortic valve sclerosis/calcification is present, without any evidence of  aortic stenosis.   6. The inferior vena cava is dilated in size with <50% respiratory  variability, suggesting right atrial pressure of 15 mmHg.   FINDINGS   Left Ventricle: Left ventricular ejection fraction, by estimation, is 30  to 35%. The left ventricle has moderately decreased function. The left  ventricle demonstrates global hypokinesis. The left ventricular internal  cavity size was normal in size.  There is moderate left ventricular hypertrophy. Left ventricular diastolic  parameters are consistent with Grade III diastolic dysfunction  (restrictive). Elevated left atrial pressure.   Right Ventricle: The right ventricular size is normal. No increase in  right ventricular wall thickness. Right ventricular systolic function is  normal. There is moderately elevated pulmonary artery systolic pressure.  The tricuspid regurgitant velocity is  3.04 m/s, and with an assumed right atrial pressure of 15 mmHg, the  estimated right ventricular systolic pressure is 93.7 mmHg.   Left Atrium: Left atrial size was severely dilated.   Right Atrium: Right  atrial size was normal in size.   Pericardium: Trivial pericardial effusion is present.   Mitral Valve: The mitral valve is normal in structure. Moderate mitral  valve regurgitation.   Tricuspid Valve: The tricuspid valve is normal in structure. Tricuspid  valve regurgitation is trivial.   Aortic Valve: The aortic  valve is tricuspid. Aortic valve regurgitation is  mild. Aortic valve sclerosis/calcification is present, without any  evidence of aortic stenosis.   Pulmonic Valve: The pulmonic valve was not well visualized. Pulmonic valve  regurgitation is trivial.   Aorta: The aortic root and ascending aorta are structurally normal, with  no evidence of dilitation.   Venous: The inferior vena cava is dilated in size with less than 50%  respiratory variability, suggesting right atrial pressure of 15 mmHg.   IAS/Shunts: The interatrial septum was not well visualized.      LEFT VENTRICLE  PLAX 2D  LVIDd:         4.90 cm      Diastology  LVIDs:         4.20 cm      LV e' medial:    3.15 cm/s  LV PW:         1.40 cm      LV E/e' medial:  31.1  LV IVS:        1.40 cm      LV e' lateral:   9.57 cm/s  LVOT diam:     2.00 cm      LV E/e' lateral: 10.3  LVOT Area:     3.14 cm     LV Volumes (MOD)  LV vol d, MOD A2C: 149.0 ml  LV vol d, MOD A4C: 162.0 ml  LV vol s, MOD A2C: 100.0 ml  LV vol s, MOD A4C: 108.0 ml  LV SV MOD A2C:     49.0 ml  LV SV MOD A4C:     162.0 ml  LV SV MOD BP:      49.3 ml   RIGHT VENTRICLE             IVC  RV Basal diam:  3.50 cm     IVC diam: 2.60 cm  RV S prime:     15.10 cm/s   LEFT ATRIUM             RIGHT ATRIUM  LA diam:        4.90 cm RA Area:     18.80 cm  LA Vol (A2C):   91.7 ml RA Volume:   48.70 ml  LA Vol (A4C):   87.6 ml  LA Biplane Vol: 91.8 ml                         PULMONIC VALVE  AORTA                 PV Vmax:       0.92 m/s  Ao Root diam: 3.40 cm PV Peak grad:  3.4 mmHg  Ao Asc diam:  3.10 cm      MITRAL VALVE               TRICUSPID VALVE  MV Area (PHT): 4.63 cm    TR Peak grad:   37.0 mmHg  MV Decel Time: 164 msec    TR Vmax:        304.00 cm/s  MV E velocity: 98.10 cm/s  MV A velocity: 44.60 cm/s  SHUNTS  MV E/A ratio:  2.20        Systemic Diam: 2.00 cm   Antimicrobials: Anti-infectives (From admission, onward)    Start     Dose/Rate  Route Frequency Ordered Stop   11/22/21 1545  cefTRIAXone (ROCEPHIN) 2 g in sodium chloride 0.9 % 100 mL IVPB        2 g 200 mL/hr over 30 Minutes Intravenous Every 24 hours 11/22/21 1445     11/22/21 1545  azithromycin (ZITHROMAX) 500 mg in sodium chloride 0.9 % 250 mL IVPB        500 mg 250 mL/hr over 60 Minutes Intravenous Every 24 hours 11/22/21 1445         Subjective: Seen and examined at bedside with the [place] interpreter over [place] audio translator and the patient was wanting to leave to go pay his bills.  Denied chest pain or shortness breath but now states that he is having abdominal pain and bloody stools with dark tarry stools and did not want to take any of his medications with abdominal pain.  Felt okay and just wanting to leave.  No other concerns or plans at this time.  Patient's blood count dropped and attempts were made to have the patient consent for blood transfusion given his suspected GI bleeding but he would not consent and there is no family at bedside and interpreters state that we are trying to "experiment on him".  Called his son and daughter and they did not pick up.  Objective: Vitals:   11/25/21 0654 11/25/21 0738 11/25/21 1226 11/25/21 1500  BP: 119/68 (!) 115/48 (!) 126/57 135/84  Pulse: 79     Resp:   20 (!) 25  Temp:  97.7 F (36.5 C) 97.8 F (36.6 C) 98.1 F (36.7 C)  TempSrc:  Oral Oral Oral  SpO2:   93%   Weight:        Intake/Output Summary (Last 24 hours) at 11/25/2021 1827 Last data filed at 11/25/2021 1825 Gross per 24 hour  Intake 240 ml  Output 1650 ml  Net -1410 ml   Filed Weights   11/23/21 0640 11/24/21 0348  Weight: 58.2 kg 57.2 kg   Examination: Physical Exam:  Constitutional: Thin elderly evaluation male who is very agitated Eyes: Lids and conjunctivae normal, sclerae anicteric  ENMT: External Ears, Nose appear normal. Grossly normal hearing. Mucous membranes are moist. Neck: Appears normal, supple, no cervical masses,  normal ROM, no appreciable thyromegaly: No appreciable JVD Respiratory: Diminished to auscultation bilaterally with coarse breath sounds, no wheezing, rales, rhonchi or crackles. Normal respiratory effort and patient is not tachypenic. No accessory muscle use.  Not wearing supplemental oxygen via nasal cannula Cardiovascular: RRR, no murmurs / rubs / gallops. S1 and S2 auscultated.  Mild lower extremity edema Abdomen: Soft, a little tender to palpate, non-distended. Bowel sounds positive.  GU: Deferred. Musculoskeletal: No clubbing / cyanosis of digits/nails. No joint deformity upper and lower extremities.  Skin: No rashes, lesions, ulcers on limited skin evaluation. No induration; Warm and dry.  Neurologic: CN 2-12 grossly intact with no focal deficits. Romberg sign and cerebellar reflexes not assessed.  Psychiatric: Normal judgment and insight. Alert and oriented x 3. Normal mood and appropriate affect.   Data Reviewed: I have personally reviewed following labs and imaging studies  CBC: Recent Labs  Lab 11/21/21 1945 11/21/21 2006 11/22/21 0600 11/22/21 0628 11/23/21 0700 11/24/21 0206 11/25/21 0021  WBC 19.4*  --  17.8*  --  11.4* 10.2 9.5  NEUTROABS 11.7*  --  14.2*  --   --   --   --   HGB 11.1*   < > 10.3* 11.6* 8.6* 8.6* 8.5*  HCT 41.1   < > 36.8* 34.0* 29.8* 28.5* 29.4*  MCV 77.8*  --  76.3*  --  73.2* 72.7* 74.2*  PLT 765*  --  616*  --  622* 562* 593*   < > = values in this interval not displayed.   Basic Metabolic Panel: Recent Labs  Lab 11/22/21 0600 11/22/21 0628 11/23/21 0700 11/23/21 1431 11/24/21 0749 11/25/21 0021  NA 139 138 135 134* 132* 133*  K 4.3 4.3 4.3 4.1 4.7 4.6  CL 106  --  103 98 100 101  CO2 24  --  24 26 21* 22  GLUCOSE 139*  --  152* 169* 240* 129*  BUN 57*  --  42* 42* 40* 42*  CREATININE 1.68*  --  1.63* 1.81* 1.71* 2.06*  CALCIUM 8.5*  --  8.2* 8.3* 8.4* 8.6*  MG 2.2  --   --   --   --   --   PHOS 4.4  --   --   --   --   --     GFR: CrCl cannot be calculated (Unknown ideal weight.). Liver Function Tests: Recent Labs  Lab 11/22/21 0600 11/23/21 1431  AST 46* 42*  ALT 48* 44  ALKPHOS 88 85  BILITOT 0.5 0.6  PROT 6.6 6.7  ALBUMIN 3.0* 3.1*   No results for input(s): LIPASE, AMYLASE in the last 168 hours. No results for input(s): AMMONIA in the last 168 hours. Coagulation Profile: Recent Labs  Lab 11/21/21 2315  INR 1.0   Cardiac Enzymes: No results for input(s): CKTOTAL, CKMB, CKMBINDEX, TROPONINI in the last 168 hours. BNP (last 3 results) No results for input(s): PROBNP in the last 8760 hours. HbA1C: No results for input(s): HGBA1C in the last 72 hours. CBG: Recent Labs  Lab 11/24/21 1124 11/24/21 1530 11/25/21 0648 11/25/21 1156 11/25/21 1655  GLUCAP 136* 106* 151* 129* 127*   Lipid Profile: Recent Labs    11/23/21 0700  CHOL 134  HDL 60  LDLCALC 60  TRIG 71  CHOLHDL 2.2   Thyroid Function Tests: No results for input(s): TSH, T4TOTAL, FREET4, T3FREE, THYROIDAB in the last 72 hours. Anemia Panel: No results for input(s): VITAMINB12, FOLATE, FERRITIN, TIBC, IRON, RETICCTPCT in the last 72 hours. Sepsis Labs: Recent Labs  Lab 11/22/21 0600  PROCALCITON 0.16    No results found for this or any previous visit (from the past 240 hour(s)).   RN Pressure Injury Documentation:   There is no height or weight on file to calculate BMI.  Malnutrition Type:   Malnutrition Characteristics:   Nutrition Interventions:     Radiology Studies: No results found.  Scheduled Meds:  sodium chloride   Intravenous Once   aspirin EC  81 mg Oral Daily   atorvastatin  80 mg Oral Daily   carvedilol  3.125 mg Oral BID WC   clopidogrel  75 mg Oral Daily   enoxaparin (LOVENOX) injection  30 mg Subcutaneous Q24H   insulin aspart  0-6 Units Subcutaneous TID WC   [START ON 11/28/2021] pantoprazole  40 mg Intravenous Q12H   Continuous Infusions:  azithromycin 500 mg (11/25/21 1638)    cefTRIAXone (ROCEPHIN)  IV 2 g (11/25/21 1557)   pantoprazole 8 mg/hr (11/25/21 1248)    LOS: 4 days   Kerney Elbe, DO  [facility] PAGER is on [place]  If 7PM-7AM, please contact night-coverage [email or web address]

## 2021-11-25 NOTE — Consult Note (Addendum)
Consultation  Referring Provider: Dr. Alfredia Ferguson     Primary Care Physician:  Jilda Panda, MD Primary Gastroenterologist: Althia Forts        Reason for Consultation:   Melena, anemia         HPI:   Randy [name] is a 80 y.o. male with history of hypertension, hyperlipidemia, CKD stage III-IV, gout, ischemic CMP, LVEF 35-40%>> now 25% patient admitted 11/21/2021 with acute hypoxic hypercapnic respiratory failure after presenting to [facility] ED via EMS for evaluation of shortness of breath.    Apparently [facility] for further evaluation of shortness of breath.  We are consulted now in regards to anemia and melena since the patient was started on heparin.  He was found to have NSTEMI.    Today, history is very hard to garner, we use an interpreter via the telephone for [place], but the patient repeatedly says the same thing regardless of my questioning.  Apparently per him he had black loose stools which were uncontrollable for 3 days, the last one was yesterday.  He thinks it had something to do with "oil on my food".  Tells me the only abdominal pain he experienced was right before his bowel movement and otherwise denies any nausea, vomiting, heartburn or reflux.  Denies any previous endoscopic work-up.  Also tells me that he has been having nosebleeds.    Denies fever, chills, weight loss or symptoms that awaken him from sleep.  ED course: Heart rate 106-107, BP 170/81, initial respiratory 26-29, improved to 15-18 following BiPAP, initial oxygen saturation 87%, improved to 100% on BiPAP, hemoglobin 11, chest x-ray suggestive of pulmonary edema in the absence of overt infiltrate, pleural effusion or pneumothorax, elevated troponin 121 and patient started on heparin drip  GI history: None  Past medical history: See HPI  Past surgical history: None  Family history: No GI cancers  Social History   Tobacco Use  . Smoking  status: Never  . Smokeless tobacco: Never  Substance Use Topics  . Alcohol use: Never  . Drug use: Never    Prior to Admission medications   Medication Sig Start Date End Date Taking? Authorizing Provider  Acetaminophen (TYLENOL PO) Take 3 tablets by mouth 2 (two) times daily as needed (pain/headache).   Yes [provider]  amoxicillin (AMOXIL) 250 MG capsule Take 250 mg by mouth every 12 (twelve) hours. For 2 days Patient not taking: Reported on 11/22/2021 11/20/21   [provider]  atorvastatin (LIPITOR) 80 MG tablet Take 80 mg by mouth daily. 07/16/21   [provider]  carvedilol (COREG) 3.125 MG tablet Take 3.125 mg by mouth 2 (two) times daily. Patient not taking: Reported on 11/22/2021 11/20/21   [provider]  cephALEXin (KEFLEX) 500 MG capsule Take 500 mg by mouth in the morning, at noon, and at bedtime. For 2 days Patient not taking: Reported on 11/22/2021 11/20/21   [provider]  clopidogrel (PLAVIX) 75 MG tablet Take 75 mg by mouth daily. 07/16/21   [provider]  ferrous sulfate 325 (65 FE) MG tablet Take 325 mg by mouth daily with breakfast. Patient not taking: Reported on 11/22/2021    [provider]  finasteride (PROSCAR) 5 MG tablet Take 5 mg by mouth daily. Patient not taking: Reported on 11/22/2021 11/20/21   [provider]  furosemide (LASIX) 80 MG tablet Take 80 mg by mouth daily. 07/04/21   [provider]  isosorbide-hydrALAZINE (BIDIL) 20-37.5 MG tablet Take 1 tablet by mouth 3 (three) times daily. 08/28/21   [provider]  JARDIANCE 10 MG TABS tablet Take 10 mg by mouth daily. 07/16/21   [provider]  metFORMIN (GLUCOPHAGE) 500 MG tablet Take 500 mg by mouth in the morning and at bedtime. 06/10/21   [provider]  pantoprazole (PROTONIX) 40 MG tablet Take 40 mg by mouth daily. 07/16/21   [provider]  tamsulosin (FLOMAX) 0.4 MG CAPS capsule  Take 0.8 mg by mouth daily. 07/05/21   [provider]    Current Facility-Administered Medications  Medication Dose Route Frequency Provider Last Rate Last Admin  . acetaminophen (TYLENOL) tablet 650 mg  650 mg Oral Q6H PRN Howerter, Justin B, DO       Or  . acetaminophen (TYLENOL) suppository 650 mg  650 mg Rectal Q6H PRN Howerter, Justin B, DO      . aspirin EC tablet 81 mg  81 mg Oral Daily Bhagat, Bhavinkumar, PA   81 mg at 11/25/21 1056  . atorvastatin (LIPITOR) tablet 80 mg  80 mg Oral Daily Bhagat, Bhavinkumar, PA   80 mg at 11/25/21 1056  . azithromycin (ZITHROMAX) 500 mg in sodium chloride 0.9 % 250 mL IVPB  500 mg Intravenous Q24H Oswald Hillock, MD 250 mL/hr at 11/24/21 1605 New Bag at 11/24/21 1605  . carvedilol (COREG) tablet 3.125 mg  3.125 mg Oral BID WC Bhagat, Bhavinkumar, PA   3.125 mg at 11/25/21 0654  . cefTRIAXone (ROCEPHIN) 2 g in sodium chloride 0.9 % 100 mL IVPB  2 g Intravenous Q24H Oswald Hillock, MD 200 mL/hr at 11/24/21 1529 2 g at 11/24/21 1529  . clopidogrel (PLAVIX) tablet 75 mg  75 mg Oral Daily Bhagat, Bhavinkumar, PA   75 mg at 11/25/21 1056  . enoxaparin (LOVENOX) injection 30 mg  30 mg Subcutaneous Q24H Cristescu, Mircea G, MD   30 mg at 11/24/21 1152  . hydrALAZINE (APRESOLINE) injection 10 mg  10 mg Intravenous Q8H PRN Janina Mayo, MD      . insulin aspart (novoLOG) injection 0-6 Units  0-6 Units Subcutaneous TID WC Howerter, Justin B, DO   1 Units at 11/25/21 0659  . pantoprazole (PROTONIX) 80 mg /NS 100 mL IVPB  80 mg Intravenous Once Sheikh, Georgina Quint Latif, DO      . [START ON 11/28/2021] pantoprazole (PROTONIX) injection 40 mg  40 mg Intravenous Q12H Sheikh, Omair Latif, DO      . pantoprozole (PROTONIX) 80 mg /NS 100 mL infusion  8 mg/hr Intravenous Continuous Sheikh, Georgina Quint Latif, DO        Allergies as of 11/21/2021  . (Not on File)     Review of Systems:    Constitutional: No weight loss, fever or chills Skin: No rash  Cardiovascular:  No current chest pain  Respiratory: No current SOB Gastrointestinal: See HPI and otherwise negative Genitourinary: No dysuria  Neurological: No headache, dizziness or syncope Musculoskeletal: No new muscle or joint pain Hematologic: No bruising Psychiatric: No history of depression or anxiety    Physical Exam:  Vital signs in last 24 hours: Temp:  [97.7 F (36.5 C)-98.7 F (37.1 C)] 97.7 F (36.5 C) (12/05 0738) Pulse Rate:  [71-79] 79 (12/05 0654) Resp:  [20] 20 (12/04 1949) BP: (102-119)/(48-68) 115/48 (12/05 0738) SpO2:  [93 %-98 %] 93 % (12/04 1949) Last BM Date: 11/24/21 General:   Pleasant elderly [place] male appears to be in NAD,  Well developed, Well nourished, alert and cooperative Head:  Normocephalic and atraumatic. Eyes:   PEERL, EOMI. No icterus. Conjunctiva pink. Ears:  Normal auditory acuity. Neck:  Supple Throat: Oral cavity and pharynx without inflammation, swelling or lesion.  Very poor dentition, missing multiple teeth Lungs: Respirations even and unlabored. Lungs clear to auscultation bilaterally.   No wheezes, crackles, or rhonchi.  Heart: Normal S1, S2. No MRG. Regular rate and rhythm. No peripheral edema, cyanosis or pallor.  Abdomen:  Soft, nondistended, nontender. No rebound or guarding. Normal bowel sounds. No appreciable masses or hepatomegaly. Rectal:  Not performed.  Msk:  Symmetrical without gross deformities. Peripheral pulses intact.  Extremities:  Without edema, no deformity or joint abnormality.  Neurologic:  Alert and  oriented x4;  grossly normal neurologically.  Skin:   Dry and intact without significant lesions or rashes. Psychiatric: Demonstrates good judgement and reason without abnormal affect or behaviors.   LAB RESULTS: Recent Labs    11/23/21 0700 11/24/21 0206 11/25/21 0021  WBC 11.4* 10.2 9.5  HGB 8.6* 8.6* 8.5*  HCT 29.8* 28.5* 29.4*  PLT 622* 562* 593*   BMET Recent Labs    11/23/21 1431 11/24/21 0749 11/25/21 0021   NA 134* 132* 133*  K 4.1 4.7 4.6  CL 98 100 101  CO2 26 21* 22  GLUCOSE 169* 240* 129*  BUN 42* 40* 42*  CREATININE 1.81* 1.71* 2.06*  CALCIUM 8.3* 8.4* 8.6*   LFT Recent Labs    11/23/21 1431  PROT 6.7  ALBUMIN 3.1*  AST 42*  ALT 44  ALKPHOS 85  BILITOT 0.6    Impression / Plan:   Impression: 1.  Melena and anemia: Hemoglobin 11 on admission--> 8.5, he is currently on dual antiplatelet therapy (most recently heparin drip) now on Plavix and Aspirin, no prior history of heartburn/reflux, no current abdominal pain; GI bleed likely related to anticoagulation 2.  Acute hypoxic, hypercapnic respiratory failure: Due to suspected flash pulmonary edema, patient on BiPAP, now resolved and no longer on BiPAP 3.  Elevated troponin: Initially found to be 121, started on heparin drip, now with no chest pain 4.  Elevated creatinine: Found to be 2.06 admission, felt to be acute on chronic kidney injury in the setting of his diuretics 5.  NSTEMI: Noted to have acute decompensated heart failure with systolic ejection fraction reduced to 30-35%, patient was started on IV heparin initially and completed a drip for 48 hours, then loaded with aspirin and Plavix  Plan: 1.  Per cardiology continue to monitor hemoglobin with transfusion as needed less than 9 given his recent NSTEMI 2.  Continue on Protonix 40 mg twice daily 3.  Patient would benefit from EGD for further evaluation.  This will likely be diagnostic on his Plavix given recent NSTEMI.  Patient is eating when I enter the room this afternoon, so the soonest this could be done is tomorrow.  Will discuss timing with Dr. Loletha Carrow.  Thank you for your kind consultation, we will continue to follow.  Lavone Nian [facility]  11/25/2021, 11:42 AM   I have reviewed the entire case in detail with the above APP and discussed the plan in detail.  Therefore, I agree with the diagnoses recorded above. In addition,  I have personally interviewed and examined  the patient and have personally reviewed any abdominal/pelvic CT scan images.  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  He has stabilized since admission, his EF is decreased from baseline and his respiratory status is much improved after diuresis.  Cardiology believes that he is euvolemic at this point, and it is noted that his creatinine continues to slowly rise.  Diuretics and ACE inhibitor have apparently been stopped.  Patient limited historian, reported black stool for few days.  His nurse today had him on the day of admission and says that no melena was witnessed that day, and today he had a regular brown bowel movement with no signs of overt bleeding.  His hemoglobin decreased in the admission level of 10.3 to 8.62 days ago and stable at 8.5 today.  Microcytic anemia, no iron levels done, not clear if iron deficiency or perhaps thalassemia ([place]).  Plans for an upper endoscopy tomorrow to see if there is an identifiable source of upper GI bleeding and acute on chronic anemia.  This is essential since patient requires indefinite DAPT.  The acute bleeding that was occurring has stopped at this point, perhaps coinciding with discontinuation of heparin. Patient at increased risk for cardiopulmonary complications of procedure due to medical comorbidities.  Will have PA return to see patient and use interpreter services for procedural consent.  CBC and iron studies tomorrow morning  Will message hospitalist physician since today's cardiology note indicates they may want patient transfuse to keep hemoglobin over 9 in the setting of acute coronary syndrome.  [name] [phone]

## 2021-11-25 NOTE — Progress Notes (Signed)
After educating pt, he refused vitals and assessments from me and nurse tech. Pt also took off leads and O2 sensor, Pt states that he just wants to go sleep.

## 2021-11-25 NOTE — Progress Notes (Addendum)
RN to room to obtain blood consent with Barnett Applebaum on the live video interpreter translating, patient says he thinks the dr are trying to experiment on him. Explained (through [place]) that these procedures were necessary and not "experiments". Patient then states that in the [place] war he had his blood drawn and they told him that his blood was fine and now we are telling him his blood counts are low. Although patient is able to answer questions about person place and date, patient does not seem oriented enough to for this RN to feel comfortable to obtain consents at this time.   Orders noted for blood to be transfused-patient does not seem to understand why he needs the blood Dr. Alfredia Ferguson is aware that RN was unable/uncomfortable getting this consent.

## 2021-11-25 NOTE — Progress Notes (Signed)
Went to obtain consent for egd procedure tomorrow as per order- using the ipad interpreter in [employer], patient endorses he has no clue about any planned procedure for tomorrow, states that the only understanding he has was the doctors are working on his "breathing and circulation" Text page sent to Dr. Loletha Carrow and PA about patients lack of understanding of procedure-consent NOT signed at this time.

## 2021-11-25 NOTE — Care Management Important Message (Signed)
Important Message  Patient Details  Name: Randy [name] MRN: [id number] Date of Birth: 1941-07-21   Medicare Important Message Given:  Yes     Teshawn Moan 11/25/2021, 3:03 PM

## 2021-11-26 ENCOUNTER — Inpatient Hospital Stay (HOSPITAL_COMMUNITY): Payer: Medicare Other | Admitting: Anesthesiology

## 2021-11-26 ENCOUNTER — Encounter (HOSPITAL_COMMUNITY)
Admission: EM | Disposition: A | Discharge status: Home Health | Payer: Self-pay | Source: Home / Self Care | Attending: Internal Medicine

## 2021-11-26 DIAGNOSIS — R778 Other specified abnormalities of plasma proteins: Secondary | ICD-10-CM | POA: Diagnosis not present

## 2021-11-26 DIAGNOSIS — J9602 Acute respiratory failure with hypercapnia: Secondary | ICD-10-CM | POA: Diagnosis not present

## 2021-11-26 DIAGNOSIS — R739 Hyperglycemia, unspecified: Secondary | ICD-10-CM | POA: Diagnosis not present

## 2021-11-26 DIAGNOSIS — J9601 Acute respiratory failure with hypoxia: Secondary | ICD-10-CM | POA: Diagnosis not present

## 2021-11-26 DIAGNOSIS — R7989 Other specified abnormal findings of blood chemistry: Secondary | ICD-10-CM | POA: Diagnosis not present

## 2021-11-26 DIAGNOSIS — D62 Acute posthemorrhagic anemia: Secondary | ICD-10-CM

## 2021-11-26 HISTORY — PX: ESOPHAGOGASTRODUODENOSCOPY (EGD) WITH PROPOFOL: SHX5813

## 2021-11-26 LAB — CBC
HCT: 30.2 % — ABNORMAL LOW (ref 39.0–52.0)
Hemoglobin: 8.9 g/dL — ABNORMAL LOW (ref 13.0–17.0)
MCH: 22 pg — ABNORMAL LOW (ref 26.0–34.0)
MCHC: 29.5 g/dL — ABNORMAL LOW (ref 30.0–36.0)
MCV: 74.8 fL — ABNORMAL LOW (ref 80.0–100.0)
Platelets: 624 10*3/uL — ABNORMAL HIGH (ref 150–400)
RBC: 4.04 MIL/uL — ABNORMAL LOW (ref 4.22–5.81)
RDW: 21.2 % — ABNORMAL HIGH (ref 11.5–15.5)
WBC: 9.7 10*3/uL (ref 4.0–10.5)
nRBC: 0 % (ref 0.0–0.2)

## 2021-11-26 LAB — GLUCOSE, CAPILLARY
Glucose-Capillary: 128 mg/dL — ABNORMAL HIGH (ref 70–99)
Glucose-Capillary: 108 mg/dL — ABNORMAL HIGH (ref 70–99)
Glucose-Capillary: 135 mg/dL — ABNORMAL HIGH (ref 70–99)

## 2021-11-26 LAB — IRON AND TIBC
Iron: 43 ug/dL — ABNORMAL LOW (ref 45–182)
Saturation Ratios: 11 % — ABNORMAL LOW (ref 17.9–39.5)
TIBC: 391 ug/dL (ref 250–450)
UIBC: 348 ug/dL

## 2021-11-26 LAB — FERRITIN: Ferritin: 336 ng/mL (ref 24–336)

## 2021-11-26 SURGERY — ESOPHAGOGASTRODUODENOSCOPY (EGD) WITH PROPOFOL
Anesthesia: Monitor Anesthesia Care

## 2021-11-26 MED ORDER — PROPOFOL 10 MG/ML IV BOLUS
INTRAVENOUS | Status: DC | PRN
  Administered 2021-11-26: 10 mg via INTRAVENOUS
  Administered 2021-11-26: 20 mg via INTRAVENOUS
  Administered 2021-11-26: 40 mg via INTRAVENOUS

## 2021-11-26 MED ORDER — SODIUM CHLORIDE 0.9 % IV SOLN
INTRAVENOUS | Status: AC | PRN
  Administered 2021-11-26: 500 mL via INTRAMUSCULAR

## 2021-11-26 MED ORDER — PHENYLEPHRINE 40 MCG/ML (10ML) SYRINGE FOR IV PUSH (FOR BLOOD PRESSURE SUPPORT)
PREFILLED_SYRINGE | INTRAVENOUS | Status: DC | PRN
  Administered 2021-11-26 (×2): 40 ug via INTRAVENOUS

## 2021-11-26 MED ORDER — SODIUM CHLORIDE 0.9 % IV SOLN
510.0000 mg | Freq: Once | INTRAVENOUS | Status: AC
  Administered 2021-11-26: 510 mg via INTRAVENOUS
  Filled 2021-11-26: qty 17

## 2021-11-26 MED ORDER — LIDOCAINE 2% (20 MG/ML) 5 ML SYRINGE
INTRAMUSCULAR | Status: DC | PRN
  Administered 2021-11-26: 40 mg via INTRAVENOUS

## 2021-11-26 SURGICAL SUPPLY — 15 items

## 2021-11-26 NOTE — Anesthesia Preprocedure Evaluation (Signed)
Anesthesia Evaluation  Patient identified by MRN, date of birth, ID band Patient awake    Reviewed: Allergy & Precautions, NPO status , Patient's Chart, lab work & pertinent test results  History of Anesthesia Complications Negative for: history of anesthetic complications  Airway Mallampati: II  TM Distance: >3 FB     Dental  (+) Poor Dentition, Loose, Missing, Dental Advisory Given,    Pulmonary neg pulmonary ROS,    breath sounds clear to auscultation       Cardiovascular negative cardio ROS   Rhythm:Regular     Neuro/Psych negative neurological ROS     GI/Hepatic Neg liver ROS, Anemia, Melena  ? GI bleed   Endo/Other  diabetesLab Results      Component                Value               Date                      HGBA1C                   7.2 (H)             11/22/2021             Renal/GU Renal InsufficiencyRenal diseaseLab Results      Component                Value               Date                      CREATININE               2.06 (H)            11/25/2021                Musculoskeletal negative musculoskeletal ROS (+)   Abdominal   Peds  Hematology  (+) Blood dyscrasia, anemia , Lab Results      Component                Value               Date                      WBC                      9.7                 11/26/2021                HGB                      8.9 (L)             11/26/2021                HCT                      30.2 (L)            11/26/2021                MCV                      74.8 (L)  11/26/2021                PLT                      624 (H)             11/26/2021              Anesthesia Other Findings   Reproductive/Obstetrics                             Anesthesia Physical Anesthesia Plan  ASA: 3  Anesthesia Plan: MAC   Post-op Pain Management: Minimal or no pain anticipated   Induction: Intravenous  PONV Risk Score and Plan:  1 and Propofol infusion and Treatment may vary due to age or medical condition  Airway Management Planned: Nasal Cannula  Additional Equipment: None  Intra-op Plan:   Post-operative Plan:   Informed Consent: I have reviewed the patients History and Physical, chart, labs and discussed the procedure including the risks, benefits and alternatives for the proposed anesthesia with the patient or authorized representative who has indicated his/her understanding and acceptance.     Dental advisory given  Plan Discussed with: CRNA and Anesthesiologist  Anesthesia Plan Comments:         Anesthesia Quick Evaluation

## 2021-11-26 NOTE — Op Note (Signed)
[facility] Patient Name: Randy [name] Procedure Date : 11/26/2021 MRN: [id number] Attending MD: Estill Cotta. Loletha Carrow , MD Date of Birth: 12/08/1941 CSN: [id number] Age: 80 Admit Type: Inpatient Procedure:                Upper GI endoscopy Indications:              Unexplained iron deficiency anemia, Melena                            (reported - none witnessd during hospital stay)                           admitted with NSTEMI and pulmonary edema Providers:                Mallie Mussel L. Loletha Carrow, MD, Lodema Hong Technician,                            Technician Referring MD:             [facility] Medicines:                Monitored Anesthesia Care Complications:            No immediate complications. Estimated Blood Loss:     Estimated blood loss: none. Procedure:                Pre-Anesthesia Assessment:                           - Prior to the procedure, a History and Physical                            was performed, and patient medications and                            allergies were reviewed. The patient's tolerance of                            previous anesthesia was also reviewed. The risks                            and benefits of the procedure and the sedation                            options and risks were discussed with the patient.                            All questions were answered, and informed consent                            was obtained. Prior Anticoagulants: The patient has                            taken Plavix (clopidogrel), last dose was 1 day  prior to procedure. ASA Grade Assessment: IV - A                            patient with severe systemic disease that is a                            constant threat to life. After reviewing the risks                            and benefits, the patient was deemed in                            satisfactory condition to undergo the procedure.                           After  obtaining informed consent, the endoscope was                            passed under direct vision. Throughout the                            procedure, the patient's blood pressure, pulse, and                            oxygen saturations were monitored continuously. The                            [id number]-[id number] ([id number]) Olympus endoscope was introduced                            through the mouth, and advanced to the second part                            of duodenum. The upper GI endoscopy was                            accomplished without difficulty. The patient                            tolerated the procedure well. Scope In: Scope Out: Findings:      The esophagus was normal.      The stomach was normal.      The cardia and gastric fundus were normal on retroflexion.      The examined duodenum was normal. Impression:               - Normal esophagus.                           - Normal stomach.                           - Normal examined duodenum.                           - No specimens collected.  No source of GI bleeding seen (if that is what                            reported black stool represented), no overt GI                            bleeding witnessed this admission. Moderate Sedation:      MAC sedation used Recommendation:           - Return patient to hospital ward for ongoing care.                           - Low sodium diet today.                           - dose of IV iron ordered for today.                           patient already on oral iron                           stop PPI. OK to continue DAPT for severe                            multivessel, non-operative CAD                           would not advocate colonoscopy at present due to                            high risk sedation given ACS and no overt GI                            bleeding Procedure Code(s):        --- Professional ---                           [phone],  Esophagogastroduodenoscopy, flexible,                            transoral; diagnostic, including collection of                            specimen(s) by brushing or washing, when performed                            (separate procedure) Diagnosis Code(s):        --- Professional ---                           D50.9, Iron deficiency anemia, unspecified                           K92.1, Melena (includes Hematochezia) CPT copyright 2019 American Medical Association. All rights reserved. The codes documented in this report are preliminary and upon coder review may  be revised to meet  current compliance requirements. Gonsalo Cuthbertson L. Loletha Carrow, MD 11/26/2021 12:31:42 PM This report has been signed electronically. Number of Addenda: 0

## 2021-11-26 NOTE — Interval H&P Note (Signed)
History and Physical Interval Note:  11/26/2021 11:51 AM  Randy [name]  has presented today for surgery, with the diagnosis of Anemia, Melena.  The various methods of treatment have been discussed with the patient and family. After consideration of risks, benefits and other options for treatment, the patient has consented to  Procedure(s): ESOPHAGOGASTRODUODENOSCOPY (EGD) WITH PROPOFOL (N/A) as a surgical intervention.  The patient's history has been reviewed, patient examined, no change in status, stable for surgery.  I have reviewed the patient's chart and labs.  Questions were answered to the patient's satisfaction.    Consent worked out. Hgb stable today.  Patient examined and stable. [name]

## 2021-11-26 NOTE — Anesthesia Procedure Notes (Signed)
Procedure Name: MAC Date/Time: 11/26/2021 12:10 PM Performed by: Renato Shin, CRNA Pre-anesthesia Checklist: Patient identified, Emergency Drugs available, Suction available and Patient being monitored Patient Re-evaluated:Patient Re-evaluated prior to induction Oxygen Delivery Method: Nasal cannula Preoxygenation: Pre-oxygenation with 100% oxygen Induction Type: IV induction Placement Confirmation: positive ETCO2 and breath sounds checked- equal and bilateral Dental Injury: Teeth and Oropharynx as per pre-operative assessment

## 2021-11-26 NOTE — Progress Notes (Signed)
PROGRESS NOTE    [name]  WNI:[id number] DOB: Apr 28, 1941 DOA: 11/21/2021 PCP: Jilda Panda, MD   Brief Narrative:  The patient is an 80 year old [place] male with a past medical history significant for but not limited to CAD as well as other comorbidities who presented to the hospital on 11/21/2021 with acute hypoxic hypercapnic respiratory failure from [place] [facility].  He presented with acute respiratory failure and shortness of breath and further work-up was done and because he was tachypneic and decompensated started on BiPAP.  Troponin went up to 1500 and BNP was 2100 and started on a heparin drip and nitroglycerin drip.  Cardiology was consulted and he is found have an NSTEMI and was treated medically.  He was status post heparin drip and cardiology followin and he is now on aspirin 81 mg as well as Plavix 75 mg.  The cardiologist had a discussion with the patient About CABG and he decided to proceed back in February and now stable and they are feeling that it is reasonable to defer ischemic work-up at this time.  Given that he had NSTEMI he was also noted to have acute congestive heart failure with low EF and ischemic cardiomyopathy.  He was given IV diuresis with Lasix with improvement.  Subsequently patient was also found to have an acute anemia with his hemoglobin dropping.  He now complains of black stools and he was placed on a Protonix drip and GI has been consulted and planning on EGD in a.m.  Given his low hemoglobin cardiology recommended transfusing for hemoglobin greater than 9 so we will type and screen transfuse PRBCs we were unable to get consent yesterday.    I had a very detailed conversation with the patient today and he understands why he would need a EGD and he is agreeable and consent has been done.  EGD was performed and was normal and no source of bleeding was seen.  GI recommends continuing dual antiplatelet therapy and would not advocate for colonoscopy at  present due to his high risk of sedation given ACS no overt GI bleeding noted.  GI does recommend transfusion of IV iron today and stopping the PPI  Assessment & Plan:   Principal Problem:   Acute respiratory failure with hypoxia and hypercapnia (HCC) Active Problems:   SOB (shortness of breath)   Elevated troponin   Hyperglycemia   Blood creatinine increased compared with prior measurement  Acute respiratory failure with hypoxia and hypercapnia in the setting of acute systolic CHF exacerbation in the setting of NSTEMI along with questionable pneumonia -Improved -Patient is no longer wearing supplemental oxygen via nasal cannula and is on room air -SpO2: 100 % O2 Flow Rate (L/min): 2 L/min FiO2 (%): 40 % -Continuous pulse oximetry maintain O2 saturation greater 90% -Currently getting antibiotics for suspected pneumonia -He was changed from BiPAP to nasal cannula and now is on room air -Diuresis has now been discontinued  NSTEMI in setting of known coronary disease -Treated medically and cardiology was consulted -Initiated on noninvasive positive pressure ventilation with BiPAP and weaned to room air -Echocardiogram done which showed an EF of 35 to 40% cardiac output index was 6.05, 3.2 -He was noted to have mild secondary pulmonary hypertension with pulm arterial pressure of 26 and the ventricular end-diastolic pressure of 38-18 -Cardiology was consulted and the plan was to treat the NSTEMI medically -He was loaded with Plavix 70 mg p.o. daily and then placed on Plavix 75 mg -He was initiated on aspirin  81 mg p.o. 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 daily, atorvastatin 80 minutes daily, clopidogrel 75 mg daily -Strict I's and O's and daily weights -Continue monitor for signs and symptoms of volume overload; patient is -2.7 21 L since admission  Suspected PNA -WBC is improving and had normalized and is WBC is now 9.7 -Currently getting antibiotics with azithromycin 500 mg every 24 and ceftriaxone 2 g every 24 -Repeat chest x-ray in the a.m.  AKI on CKD stage IIIb -Patient's BUN/creatinine is worsening from 42/1.81 and trended down to 47.71 but now trending back up and went to 42/2.06 and his diuresis is now stopped; his BUN/creatinine is now close for some reason not repeated today so ordered and repeat in the a.m. -Avoid further nephrotoxic medications, contrast dyes, hypotension and dehydration and renally dose medications -Continue to monitor renal function carefully and repeat CBC in a.m.  Hyponatremia -Mild and sodium went from 134 and trended down to 132 and repeat was 133; for some reason was not repeated today -Continue to monitor and trend and repeat CMP in the a.m.  Acute blood loss anemia in the setting of chronic normocytic anemia with concern for GI bleeding: GI bleeding concern has been ruled out -Has been having dark black stools and abdominal pain -Initiate Protonix drip now GI is recommending discontinuing given Negative EGD -Hemoglobin/hematocrit has trended down and went to 8.5/29.4 and repeat this morning was 8.9/30.3 -GI has been consulted  and we will type and screen and transfuse 1 unit of PRBCs given cardiology recommendation given his ischemic cardiomyopathy to improve his hemoglobin greater than 9 but patient had refused blood transfusion -Check FOBT and not done -Anemia panel was checked and showed an iron level of 43, TIBC of 348, TIBC 391, saturation was 11%, ferritin level 336 -GI recommending transfusion IV iron today -Currently he is on dual antiplatelet therapy and this cannot be stopped currently unless cleared by cardiology and GI recommends continuing this  Thrombocytosis -Likely reactive and patient's platelet count went from 622 -> 562 -> 593 -> 624 -Continue to Monitor and Trend -Repeat CBC in the AM   DVT prophylaxis: SCDs Code Status: Full code Family Communication: No family currently at bedside  Disposition Plan: Pending further clinical improvement and clearance by specialists  Status is: Inpatient  Remains inpatient appropriate because: He has been having anemia and dark stools as well as abdominal pain  Consultants:  Cardiology Gastroenterology  Procedures:  ECHOCARDIOGRAM IMPRESSIONS     1. Left ventricular ejection fraction, by estimation, is 30 to 35%. The  left ventricle has moderately decreased function. The left ventricle  demonstrates global hypokinesis. There is moderate left ventricular  hypertrophy. Left ventricular diastolic  parameters are consistent with Grade III diastolic  dysfunction  (restrictive). Elevated left atrial pressure.   2. Right ventricular systolic function is normal. The right ventricular  size is normal. There is moderately elevated pulmonary artery systolic  pressure. The estimated right ventricular systolic pressure is 41.9 mmHg.   3. Left atrial size was severely dilated.   4. The mitral valve is normal in structure. Moderate mitral valve  regurgitation. Appears functional.   5. The aortic valve is tricuspid. Aortic valve regurgitation is mild.  Aortic  valve sclerosis/calcification is present, without any evidence of  aortic stenosis.   6. The inferior vena cava is dilated in size with <50% respiratory  variability, suggesting right atrial pressure of 15 mmHg.   FINDINGS   Left Ventricle: Left ventricular ejection fraction, by estimation, is 30  to 35%. The left ventricle has moderately decreased function. The left  ventricle demonstrates global hypokinesis. The left ventricular internal  cavity size was normal in size.  There is moderate left ventricular hypertrophy. Left ventricular diastolic  parameters are consistent with Grade III diastolic dysfunction  (restrictive). Elevated left atrial pressure.   Right Ventricle: The right ventricular size is normal. No increase in  right ventricular wall thickness. Right ventricular systolic function is  normal. There is moderately elevated pulmonary artery systolic pressure.  The tricuspid regurgitant velocity is  3.04 m/s, and with an assumed right atrial pressure of 15 mmHg, the  estimated right ventricular systolic pressure is 37.9 mmHg.   Left Atrium: Left atrial size was severely dilated.   Right Atrium: Right atrial size was normal in size.   Pericardium: Trivial pericardial effusion is present.   Mitral Valve: The mitral valve is normal in structure. Moderate mitral  valve regurgitation.   Tricuspid Valve: The tricuspid valve is normal in structure. Tricuspid  valve regurgitation is trivial.   Aortic Valve: The aortic valve is tricuspid. Aortic valve regurgitation is  mild. Aortic valve sclerosis/calcification is present, without any  evidence of aortic stenosis.   Pulmonic Valve: The pulmonic valve was not well visualized. Pulmonic valve  regurgitation is trivial.   Aorta: The aortic root and ascending aorta are structurally normal, with  no evidence of dilitation.   Venous: The inferior vena cava is dilated in size with less than 50%  respiratory variability,  suggesting right atrial pressure of 15 mmHg.   IAS/Shunts: The interatrial septum was not well visualized.      LEFT VENTRICLE  PLAX 2D  LVIDd:         4.90 cm      Diastology  LVIDs:         4.20 cm      LV e' medial:    3.15 cm/s  LV PW:         1.40 cm      LV E/e' medial:  31.1  LV IVS:        1.40 cm      LV e' lateral:   9.57 cm/s  LVOT diam:     2.00 cm      LV E/e' lateral: 10.3  LVOT Area:     3.14 cm     LV Volumes (MOD)  LV vol d, MOD A2C: 149.0 ml  LV vol d, MOD A4C: 162.0 ml  LV vol s, MOD A2C: 100.0 ml  LV vol s, MOD A4C: 108.0 ml  LV SV MOD A2C:     49.0 ml  LV SV MOD A4C:     162.0 ml  LV SV MOD BP:  49.3 ml   RIGHT VENTRICLE             IVC  RV Basal diam:  3.50 cm     IVC diam: 2.60 cm  RV S prime:     15.10 cm/s   LEFT ATRIUM             RIGHT ATRIUM  LA diam:        4.90 cm RA Area:     18.80 cm  LA Vol (A2C):   91.7 ml RA Volume:   48.70 ml  LA Vol (A4C):   87.6 ml  LA Biplane Vol: 91.8 ml                         PULMONIC VALVE  AORTA                 PV Vmax:       0.92 m/s  Ao Root diam: 3.40 cm PV Peak grad:  3.4 mmHg  Ao Asc diam:  3.10 cm      MITRAL VALVE               TRICUSPID VALVE  MV Area (PHT): 4.63 cm    TR Peak grad:   37.0 mmHg  MV Decel Time: 164 msec    TR Vmax:        304.00 cm/s  MV E velocity: 98.10 cm/s  MV A velocity: 44.60 cm/s  SHUNTS  MV E/A ratio:  2.20        Systemic Diam: 2.00 cm  EGD Findings:      The esophagus was normal.      The stomach was normal.      The cardia and gastric fundus were normal on retroflexion.      The examined duodenum was normal. Impression:               - Normal esophagus.                           - Normal stomach.                           - Normal examined duodenum.                           - No specimens collected.                           No source of GI bleeding seen (if that is what                            reported black stool represented), no overt GI                             bleeding witnessed this admission. Moderate Sedation:      MAC sedation used Recommendation:           - Return patient to hospital ward for ongoing care.                           - Low sodium diet today.                           -  dose of IV iron ordered for today.                           patient already on oral iron                           stop PPI. OK to continue DAPT for severe                            multivessel, non-operative CAD                           would not advocate colonoscopy at present due to                            high risk sedation given ACS and no overt GI                            bleeding   Antimicrobials: Anti-infectives (From admission, onward)    Start     Dose/Rate Route Frequency Ordered Stop   11/22/21 1545  cefTRIAXone (ROCEPHIN) 2 g in sodium chloride 0.9 % 100 mL IVPB        2 g 200 mL/hr over 30 Minutes Intravenous Every 24 hours 11/22/21 1445     11/22/21 1545  azithromycin (ZITHROMAX) 500 mg in sodium chloride 0.9 % 250 mL IVPB        500 mg 250 mL/hr over 60 Minutes Intravenous Every 24 hours 11/22/21 1445         Subjective: Seen and examined at bedside with the [place] interpreter over [employer] audio translator [place] [phone]. Had an in depth conversation about the importance of EGD and he was understandable. States his Black stools has stopped and his Abdominal Pain is improved. No nausea or vomiting. States breathing is good. No other concerns or complaints at this time.   Objective: Vitals:   11/26/21 1218 11/26/21 1228 11/26/21 1237 11/26/21 1253  BP: (!) 110/55 120/60 (!) 135/58 127/82  Pulse: (!) 58 (!) 56 (!) 58 71  Resp: (!) _0 Temp: 97.9 F (36.6 C)     TempSrc: Axillary     SpO2: 100% 100% 100%   Weight:        Intake/Output Summary (Last 24 hours) at 11/26/2021 1507 Last data filed at 11/26/2021 1222 Gross per 24 hour  Intake 137.75 ml  Output 650 ml  Net -512.25 ml    Filed Weights    11/26/21 0413 11/26/21 1135 11/26/21 1148  Weight: 59.6 kg 59.6 kg 59.6 kg   Examination: Physical Exam:  Constitutional: Thin elderly [place] male who is more calm and in the chair at bedside in no acute distress Eyes: Lids and conjunctivae normal, sclerae anicteric  ENMT: External Ears, Nose appear normal. Grossly normal hearing. Mucous membranes are moist.  Poor dentition Neck: Appears normal, supple, no cervical masses, normal ROM, no appreciable thyromegaly; no appreciable JVD Respiratory: Diminished to auscultation bilaterally with coarse breath sounds, no wheezing, rales, rhonchi or crackles. Normal respiratory effort and patient is not tachypenic. No accessory muscle use.  Cardiovascular: RRR, no murmurs / rubs / gallops. S1 and S2 auscultated. No extremity edema. 2+ pedal pulses. No carotid bruits.  Abdomen: Soft, non-tender,  non-distended. Bowel sounds positive.  GU: Deferred. Musculoskeletal: No clubbing / cyanosis of digits/nails. No joint deformity upper and lower extremities.  Skin: No rashes, lesions, ulcers on a limited skin evaluation. No induration; Warm and dry.  Neurologic: CN 2-12 grossly intact with no focal deficits.  Romberg sign and cerebellar reflexes not assessed.  Psychiatric: Normal judgment and insight. Alert and oriented x 3. Normal mood and appropriate affect.   Data Reviewed: I have personally reviewed following labs and imaging studies  CBC: Recent Labs  Lab 11/21/21 1945 11/21/21 2006 11/22/21 0600 11/22/21 0628 11/23/21 0700 11/24/21 0206 11/25/21 0021 11/26/21 0312  WBC 19.4*  --  17.8*  --  11.4* 10.2 9.5 9.7  NEUTROABS 11.7*  --  14.2*  --   --   --   --   --   HGB 11.1*   < > 10.3* 11.6* 8.6* 8.6* 8.5* 8.9*  HCT 41.1   < > 36.8* 34.0* 29.8* 28.5* 29.4* 30.2*  MCV 77.8*  --  76.3*  --  73.2* 72.7* 74.2* 74.8*  PLT 765*  --  616*  --  622* 562* 593* 624*   < > = values in this interval not displayed.    Basic Metabolic Panel: Recent Labs   Lab 11/22/21 0600 11/22/21 0628 11/23/21 0700 11/23/21 1431 11/24/21 0749 11/25/21 0021  NA 139 138 135 134* 132* 133*  K 4.3 4.3 4.3 4.1 4.7 4.6  CL 106  --  103 98 100 101  CO2 24  --  24 26 21* 22  GLUCOSE 139*  --  152* 169* 240* 129*  BUN 57*  --  42* 42* 40* 42*  CREATININE 1.68*  --  1.63* 1.81* 1.71* 2.06*  CALCIUM 8.5*  --  8.2* 8.3* 8.4* 8.6*  MG 2.2  --   --   --   --   --   PHOS 4.4  --   --   --   --   --     GFR: CrCl cannot be calculated (Unknown ideal weight.). Liver Function Tests: Recent Labs  Lab 11/22/21 0600 11/23/21 1431  AST 46* 42*  ALT 48* 44  ALKPHOS 88 85  BILITOT 0.5 0.6  PROT 6.6 6.7  ALBUMIN 3.0* 3.1*    No results for input(s): LIPASE, AMYLASE in the last 168 hours. No results for input(s): AMMONIA in the last 168 hours. Coagulation Profile: Recent Labs  Lab 11/21/21 2315  INR 1.0    Cardiac Enzymes: No results for input(s): CKTOTAL, CKMB, CKMBINDEX, TROPONINI in the last 168 hours. BNP (last 3 results) No results for input(s): PROBNP in the last 8760 hours. HbA1C: No results for input(s): HGBA1C in the last 72 hours. CBG: Recent Labs  Lab 11/25/21 0648 11/25/21 1156 11/25/21 1655 11/25/21 2127 11/26/21 0645  GLUCAP 151* 129* 127* 189* 128*    Lipid Profile: No results for input(s): CHOL, HDL, LDLCALC, TRIG, CHOLHDL, LDLDIRECT in the last 72 hours.  Thyroid Function Tests: No results for input(s): TSH, T4TOTAL, FREET4, T3FREE, THYROIDAB in the last 72 hours. Anemia Panel: Recent Labs    11/26/21 0559  FERRITIN 336  TIBC 391  IRON 43*   Sepsis Labs: Recent Labs  Lab 11/22/21 0600  PROCALCITON 0.16     No results found for this or any previous visit (from the past 240 hour(s)).   RN Pressure Injury Documentation:   There is no height or weight on file to calculate BMI.  Malnutrition Type:   Malnutrition  Characteristics:   Nutrition Interventions:     Radiology Studies: No results  found.  Scheduled Meds:  sodium chloride   Intravenous Once   aspirin EC  81 mg Oral Daily   atorvastatin  80 mg Oral Daily   carvedilol  3.125 mg Oral BID WC   clopidogrel  75 mg Oral Daily   enoxaparin (LOVENOX) injection  30 mg Subcutaneous Q24H   insulin aspart  0-6 Units Subcutaneous TID WC   Continuous Infusions:  azithromycin 500 mg (11/25/21 1638)   cefTRIAXone (ROCEPHIN)  IV 2 g (11/25/21 1557)   ferumoxytol      LOS: 5 days   Kerney Elbe, DO [facility] PAGER is on [place]  If 7PM-7AM, please contact night-coverage [email or web address]

## 2021-11-26 NOTE — H&P (View-Only) (Signed)
[name]  Went to see the patient this morning.  He is on the schedule for an EGD later this afternoon with Dr. Loletha Carrow.  Overnight there were unable to get patient to sign his consent so I went through everything with him today via video interpreter and he agreed to have procedure to find out source of bleeding in his stomach and stop it if possible.  He signed the consent with a witness of the nurse and myself present.  This was then brought to Endo.  They are planning on getting him down to the Endo suite soon.  Patient tells me that he is hungry after being NPO since midnight and we discussed that he would be able to eat after time of procedure.  Ellouise Newer, PA-C

## 2021-11-26 NOTE — Transfer of Care (Signed)
Immediate Anesthesia Transfer of Care Note  Patient: Randy [name]  Procedure(s) Performed: ESOPHAGOGASTRODUODENOSCOPY (EGD) WITH PROPOFOL  Patient Location: PACU  Anesthesia Type:MAC  Level of Consciousness: drowsy and patient cooperative  Airway & Oxygen Therapy: Patient Spontanous Breathing and Patient connected to nasal cannula oxygen  Post-op Assessment: Report given to RN and Post -op Vital signs reviewed and stable  Post vital signs: Reviewed and stable  Last Vitals:  Vitals Value Taken Time  BP 110/55 11/26/21 1218  Temp 36.6 C 11/26/21 1218  Pulse 59 11/26/21 1222  Resp 20 11/26/21 1222  SpO2 100 % 11/26/21 1222  Vitals shown include unvalidated device data.  Last Pain:  Vitals:   11/26/21 1218  TempSrc: Axillary  PainSc: Asleep         Complications: No notable events documented.

## 2021-11-26 NOTE — Progress Notes (Signed)
Progress Note  Patient Name: Randy [name] Date of Encounter: 11/26/2021  Primary Cardiologist: None   Subjective   Patient seen and examined at his bedside. He was sitting in bed when I arrived. Plans for endoscopy today but there were some questions about consent which her primary team is taking care of.  He does not offer any complaints at this time.  Inpatient Medications    Scheduled Meds:  sodium chloride   Intravenous Once   aspirin EC  81 mg Oral Daily   atorvastatin  80 mg Oral Daily   carvedilol  3.125 mg Oral BID WC   clopidogrel  75 mg Oral Daily   enoxaparin (LOVENOX) injection  30 mg Subcutaneous Q24H   insulin aspart  0-6 Units Subcutaneous TID WC   [START ON 11/28/2021] pantoprazole  40 mg Intravenous Q12H   Continuous Infusions:  azithromycin 500 mg (11/25/21 1638)   cefTRIAXone (ROCEPHIN)  IV 2 g (11/25/21 1557)   pantoprazole 8 mg/hr (11/26/21 0927)   PRN Meds: acetaminophen **OR** acetaminophen, hydrALAZINE   Vital Signs    Vitals:   11/25/21 1500 11/25/21 1928 11/25/21 2302 11/26/21 0413  BP: 135/84 120/83 139/69 122/67  Pulse:  85 70 80  Resp: (!) 25 18 16 20   Temp: 98.1 F (36.7 C) 98.4 F (36.9 C) 97.8 F (36.6 C) 98 F (36.7 C)  TempSrc: Oral Oral Oral Oral  SpO2:   98% 99%  Weight:    59.6 kg    Intake/Output Summary (Last 24 hours) at 11/26/2021 1027 Last data filed at 11/25/2021 1928 Gross per 24 hour  Intake 37.75 ml  Output 650 ml  Net -612.25 ml   Filed Weights   11/23/21 0640 11/24/21 0348 11/26/21 0413  Weight: 58.2 kg 57.2 kg 59.6 kg    Telemetry    Sinus rhythm- Personally Reviewed  ECG    None today- Personally Reviewed  Physical Exam   GEN: No acute distress.   HEENT: Poor dentition with missing tooth Neck: No JVD Cardiac: RRR, no murmurs, rubs, or gallops.  Respiratory: Clear to auscultation bilaterally. GI: Soft, nontender, non-distended  MS: No edema; No deformity. Neuro:  Nonfocal  Psych:  Normal affect   Labs    Chemistry Recent Labs  Lab 11/22/21 0600 11/22/21 0628 11/23/21 1431 11/24/21 0749 11/25/21 0021  NA 139   < > 134* 132* 133*  K 4.3   < > 4.1 4.7 4.6  CL 106   < > 98 100 101  CO2 24   < > 26 21* 22  GLUCOSE 139*   < > 169* 240* 129*  BUN 57*   < > 42* 40* 42*  CREATININE 1.68*   < > 1.81* 1.71* 2.06*  CALCIUM 8.5*   < > 8.3* 8.4* 8.6*  PROT 6.6  --  6.7  --   --   ALBUMIN 3.0*  --  3.1*  --   --   AST 46*  --  42*  --   --   ALT 48*  --  44  --   --   ALKPHOS 88  --  85  --   --   BILITOT 0.5  --  0.6  --   --   GFRNONAA 40*   < > 37* 39* 32*  ANIONGAP 9   < > 10 11 10    < > = values in this interval not displayed.     Hematology Recent Labs  Lab 11/24/21 0206 11/25/21  0021 11/26/21 0312  WBC 10.2 9.5 9.7  RBC 3.92* 3.96* 4.04*  HGB 8.6* 8.5* 8.9*  HCT 28.5* 29.4* 30.2*  MCV 72.7* 74.2* 74.8*  MCH 21.9* 21.5* 22.0*  MCHC 30.2 28.9* 29.5*  RDW 20.5* 20.9* 21.2*  PLT 562* 593* 624*    Cardiac EnzymesNo results for input(s): TROPONINI in the last 168 hours. No results for input(s): TROPIPOC in the last 168 hours.   BNP Recent Labs  Lab 11/22/21 0600  BNP 2,954.8*     DDimer No results for input(s): DDIMER in the last 168 hours.   Radiology    No results found.  Cardiac Studies   11/22/2021  1. Left ventricular ejection fraction, by estimation, is 30 to 35%. The  left ventricle has moderately decreased function. The left ventricle  demonstrates global hypokinesis. There is moderate left ventricular  hypertrophy. Left ventricular diastolic  parameters are consistent with Grade III diastolic dysfunction  (restrictive). Elevated left atrial pressure.   2. Right ventricular systolic function is normal. The right ventricular  size is normal. There is moderately elevated pulmonary artery systolic  pressure. The estimated right ventricular systolic pressure is 66.4 mmHg.   3. Left atrial size was severely dilated.   4. The  mitral valve is normal in structure. Moderate mitral valve  regurgitation. Appears functional.   5. The aortic valve is tricuspid. Aortic valve regurgitation is mild.  Aortic valve sclerosis/calcification is present, without any evidence of  aortic stenosis.   6. The inferior vena cava is dilated in size with <50% respiratory  variability, suggesting right atrial pressure of 15 mmHg.      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 male HTN, HLD, h/o CKD stage 3-4, gout, ischemic CMP (Multivessel CAD on LHC 01/2021- managed medically, not a candidate for CABG due to calcicifation in the aortic root, ascending aorta, not a candidate for PCI) LVEF 35-40%>> now 25% who is being seen 11/22/2021 for the evaluation of NSTEMI at the request of Dr. Darrick Meigs. Currently managing decompensated ischemic CM EF 30-35% with flash pulmonary edema in the setting of PNA, NSTEMI  Assessment & Plan    Non-STEMI with known coronary artery disease Acute decompensated heart failure with systolic ejection fraction Ischemic cardiomyopathy recent ejection fraction 30-35% on echocardiogram done in November 22, 2021. Pulmonary hypertension Moderate mitral regurgitation Pneumonia Acute respiratory failure with hypoxia-has resolved Anemia  He is currently on medical therapy for his NSTEMI and known coronary artery disease.  He has not had any reports of angina symptoms.  For now he is on dual antiplatelet therapy with aspirin and Plavix.   He was recently on heparin drip which she completed the appropriate therapy for over 48 hours.   Unfortunately now he has had some loss of hemoglobin suspected to be related to his use of antiplatelet therapy and anticoagulant recently.  He was evaluated by GI with plans for upper endoscopy, which is very essential given the fact that the patient is  on dual antiplatelet therapy and with his burden of coronary artery disease he needs to be on medical therapy.  Further discussion post endoscopy on his antiplatelet therapy.  Continue his Protonix 40 mg twice a day.  Unfortunately he was unable to get transfused yesterday due to multiple reasons.  Thankfully his hemoglobin was stable today 8.9.  He will benefit from getting a BMP today to reassess his kidney function as his diuretics as well as ACE inhibitor's has been held due to worsening creatinine.  Suspect acute on chronic kidney disease. He still appears to be euvolemic therefore there is no need to urgently restart diuretics.  Acute respiratory failure has resolved.  Pneumonia has been managed by the primary team.  For questions or updates, please contact [facility] Please consult [email or web address] for contact info under Cardiology/STEMI.      Signed, Berniece Salines, DO  11/26/2021, 10:27 AM

## 2021-11-26 NOTE — Progress Notes (Addendum)
[facility]  Went to see the patient this morning.  He is on the schedule for an EGD later this afternoon with Dr. Loletha Carrow.  Overnight there were unable to get patient to sign his consent so I went through everything with him today via video interpreter and he agreed to have procedure to find out source of bleeding in his stomach and stop it if possible.  He signed the consent with a witness of the nurse and myself present.  This was then brought to Endo.  They are planning on getting him down to the Endo suite soon.  Patient tells me that he is hungry after being NPO since midnight and we discussed that he would be able to eat after time of procedure.  Ellouise Newer, PA-C

## 2021-11-27 ENCOUNTER — Encounter (HOSPITAL_COMMUNITY): Payer: Self-pay | Admitting: Gastroenterology

## 2021-11-27 ENCOUNTER — Inpatient Hospital Stay (HOSPITAL_COMMUNITY): Payer: Medicare Other

## 2021-11-27 DIAGNOSIS — J9602 Acute respiratory failure with hypercapnia: Secondary | ICD-10-CM | POA: Diagnosis not present

## 2021-11-27 DIAGNOSIS — J9601 Acute respiratory failure with hypoxia: Secondary | ICD-10-CM | POA: Diagnosis not present

## 2021-11-27 LAB — GLUCOSE, CAPILLARY
Glucose-Capillary: 110 mg/dL — ABNORMAL HIGH (ref 70–99)
Glucose-Capillary: 140 mg/dL — ABNORMAL HIGH (ref 70–99)
Glucose-Capillary: 175 mg/dL — ABNORMAL HIGH (ref 70–99)
Glucose-Capillary: 105 mg/dL — ABNORMAL HIGH (ref 70–99)

## 2021-11-27 LAB — COMPREHENSIVE METABOLIC PANEL
ALT: 30 U/L (ref 0–44)
AST: 25 U/L (ref 15–41)
Albumin: 2.9 g/dL — ABNORMAL LOW (ref 3.5–5.0)
Alkaline Phosphatase: 81 U/L (ref 38–126)
Anion gap: 11 (ref 5–15)
BUN: 29 mg/dL — ABNORMAL HIGH (ref 8–23)
CO2: 22 mmol/L (ref 22–32)
Calcium: 8.5 mg/dL — ABNORMAL LOW (ref 8.9–10.3)
Chloride: 103 mmol/L (ref 98–111)
Creatinine, Ser: 1.83 mg/dL — ABNORMAL HIGH (ref 0.61–1.24)
GFR, Estimated: 36 mL/min — ABNORMAL LOW (ref >60)
Glucose, Bld: 107 mg/dL — ABNORMAL HIGH (ref 70–99)
Potassium: 4.4 mmol/L (ref 3.5–5.1)
Sodium: 136 mmol/L (ref 135–145)
Total Bilirubin: 0.9 mg/dL (ref 0.3–1.2)
Total Protein: 6.2 g/dL — ABNORMAL LOW (ref 6.5–8.1)

## 2021-11-27 LAB — CBC WITH DIFFERENTIAL/PLATELET
Abs Immature Granulocytes: 0.06 10*3/uL (ref 0.00–0.07)
Basophils Absolute: 0.1 10*3/uL (ref 0.0–0.1)
Basophils Relative: 1 %
Eosinophils Absolute: 0.3 10*3/uL (ref 0.0–0.5)
Eosinophils Relative: 3 %
HCT: 30.3 % — ABNORMAL LOW (ref 39.0–52.0)
Hemoglobin: 9 g/dL — ABNORMAL LOW (ref 13.0–17.0)
Immature Granulocytes: 1 %
Lymphocytes Relative: 12 %
Lymphs Abs: 1.3 10*3/uL (ref 0.7–4.0)
MCH: 22.2 pg — ABNORMAL LOW (ref 26.0–34.0)
MCHC: 29.7 g/dL — ABNORMAL LOW (ref 30.0–36.0)
MCV: 74.8 fL — ABNORMAL LOW (ref 80.0–100.0)
Monocytes Absolute: 0.9 10*3/uL (ref 0.1–1.0)
Monocytes Relative: 9 %
Neutro Abs: 7.7 10*3/uL (ref 1.7–7.7)
Neutrophils Relative %: 74 %
Platelets: 715 10*3/uL — ABNORMAL HIGH (ref 150–400)
RBC: 4.05 MIL/uL — ABNORMAL LOW (ref 4.22–5.81)
RDW: 21.7 % — ABNORMAL HIGH (ref 11.5–15.5)
WBC: 10.3 10*3/uL (ref 4.0–10.5)
nRBC: 0 % (ref 0.0–0.2)

## 2021-11-27 LAB — VITAMIN B12: Vitamin B-12: 544 pg/mL (ref 180–914)

## 2021-11-27 LAB — MAGNESIUM: Magnesium: 2 mg/dL (ref 1.7–2.4)

## 2021-11-27 LAB — BRAIN NATRIURETIC PEPTIDE: B Natriuretic Peptide: 4311.1 pg/mL — ABNORMAL HIGH (ref 0.0–100.0)

## 2021-11-27 LAB — PHOSPHORUS: Phosphorus: 4.3 mg/dL (ref 2.5–4.6)

## 2021-11-27 LAB — FOLATE: Folate: 10.3 ng/mL (ref >5.9)

## 2021-11-27 MED ORDER — ALUM & MAG HYDROXIDE-SIMETH 200-200-20 MG/5ML PO SUSP
30.0000 mL | Freq: Four times a day (QID) | ORAL | Status: DC | PRN
  Administered 2021-11-27: 30 mL via ORAL
  Filled 2021-11-27: qty 30

## 2021-11-27 MED ORDER — ISOSORBIDE MONONITRATE ER 30 MG PO TB24
15.0000 mg | ORAL_TABLET | Freq: Every day | ORAL | Status: DC
  Administered 2021-11-27 – 2021-11-28 (×2): 15 mg via ORAL
  Filled 2021-11-27 (×3): qty 1

## 2021-11-27 MED ORDER — FUROSEMIDE 10 MG/ML IJ SOLN
40.0000 mg | Freq: Once | INTRAMUSCULAR | Status: AC
  Administered 2021-11-27: 40 mg via INTRAVENOUS
  Filled 2021-11-27: qty 4

## 2021-11-27 MED ORDER — PANTOPRAZOLE SODIUM 40 MG PO TBEC
40.0000 mg | DELAYED_RELEASE_TABLET | Freq: Two times a day (BID) | ORAL | Status: DC
  Administered 2021-11-27 – 2021-11-28 (×3): 40 mg via ORAL
  Filled 2021-11-27 (×4): qty 1

## 2021-11-27 NOTE — Progress Notes (Signed)
Progress Note  Patient Name: Randy [name] Date of Encounter: 11/27/2021  Primary Cardiologist: None   Subjective   Patient seen and examined at his bedside. He is lying in bed and offer no complaints. He is status post endoscopy with no evidence of of GI bleeding.  Inpatient Medications    Scheduled Meds:  sodium chloride   Intravenous Once   aspirin EC  81 mg Oral Daily   atorvastatin  80 mg Oral Daily   carvedilol  3.125 mg Oral BID WC   clopidogrel  75 mg Oral Daily   enoxaparin (LOVENOX) injection  30 mg Subcutaneous Q24H   insulin aspart  0-6 Units Subcutaneous TID WC   Continuous Infusions:  PRN Meds: acetaminophen **OR** acetaminophen, alum & mag hydroxide-simeth, hydrALAZINE   Vital Signs    Vitals:   11/26/21 2305 11/27/21 0220 11/27/21 0405 11/27/21 0836  BP: (!) 99/54  128/67 103/86  Pulse: 66  72 64  Resp: 16  20 19   Temp: 98.4 F (36.9 C)  98.4 F (36.9 C) 97.6 F (36.4 C)  TempSrc: Oral  Oral   SpO2: 96%  95% 97%  Weight:  57.3 kg      Intake/Output Summary (Last 24 hours) at 11/27/2021 0955 Last data filed at 11/27/2021 0406 Gross per 24 hour  Intake 1417 ml  Output 850 ml  Net 567 ml   Filed Weights   11/26/21 1135 11/26/21 1148 11/27/21 0220  Weight: 59.6 kg 59.6 kg 57.3 kg    Telemetry    Sinus rhythm-personally Reviewed  ECG    None today - Personally Reviewed  Physical Exam   GEN: No acute distress.   Neck: No JVD Cardiac: RRR, no murmurs, rubs, or gallops.  Respiratory: Clear to auscultation bilaterally. GI: Soft, nontender, non-distended  MS: No edema; No deformity. Neuro:  Nonfocal  Psych: Normal affect   Labs    Chemistry Recent Labs  Lab 11/22/21 0600 11/22/21 0628 11/23/21 1431 11/24/21 0749 11/25/21 0021 11/27/21 0351  NA 139   < > 134* 132* 133* 136  K 4.3   < > 4.1 4.7 4.6 4.4  CL 106   < > 98 100 101 103  CO2 24   < > 26 21* 22 22  GLUCOSE 139*   < > 169* 240* 129* 107*  BUN 57*   < > 42*  40* 42* 29*  CREATININE 1.68*   < > 1.81* 1.71* 2.06* 1.83*  CALCIUM 8.5*   < > 8.3* 8.4* 8.6* 8.5*  PROT 6.6  --  6.7  --   --  6.2*  ALBUMIN 3.0*  --  3.1*  --   --  2.9*  AST 46*  --  42*  --   --  25  ALT 48*  --  44  --   --  30  ALKPHOS 88  --  85  --   --  81  BILITOT 0.5  --  0.6  --   --  0.9  GFRNONAA 40*   < > 37* 39* 32* 36*  ANIONGAP 9   < > 10 11 10 11    < > = values in this interval not displayed.     Hematology Recent Labs  Lab 11/25/21 0021 11/26/21 0312 11/27/21 0351  WBC 9.5 9.7 10.3  RBC 3.96* 4.04* 4.05*  HGB 8.5* 8.9* 9.0*  HCT 29.4* 30.2* 30.3*  MCV 74.2* 74.8* 74.8*  MCH 21.5* 22.0* 22.2*  MCHC 28.9* 29.5* 29.7*  RDW 20.9* 21.2* 21.7*  PLT 593* 624* 715*    Cardiac EnzymesNo results for input(s): TROPONINI in the last 168 hours. No results for input(s): TROPIPOC in the last 168 hours.   BNP Recent Labs  Lab 11/22/21 0600 11/27/21 0351  BNP 2,954.8* 4,311.1*     DDimer No results for input(s): DDIMER in the last 168 hours.   Radiology    DG CHEST PORT 1 VIEW  Result Date: 11/27/2021 CLINICAL DATA:  Difficulty breathing EXAM: PORTABLE CHEST 1 VIEW COMPARISON:  Previous studies including the examination of 11/21/2021 FINDINGS: Transverse diameter of heart is increased. There is interval improvement in aeration of both lungs suggesting resolving pulmonary edema. There is some residual prominence of interstitial markings in the parahilar regions. There are no new focal pulmonary infiltrates. Crowding of markings is seen in the both lower lung fields. There is minimal blunting of lateral CP angles. There is no pneumothorax. IMPRESSION: Cardiomegaly. There is interval clearing of alveolar pulmonary edema. There is slight prominence of interstitial markings in the parahilar regions suggesting residual mild interstitial edema or interstitial pneumonitis. No new focal pulmonary infiltrates are seen. Possible minimal pleural effusions. There is no  pneumothorax. Electronically Signed   By: Elmer Picker M.D.   On: 11/27/2021 09:05    Cardiac Studies   11/22/2021  1. Left ventricular ejection fraction, by estimation, is 30 to 35%. The  left ventricle has moderately decreased function. The left ventricle  demonstrates global hypokinesis. There is moderate left ventricular  hypertrophy. Left ventricular diastolic  parameters are consistent with Grade III diastolic dysfunction  (restrictive). Elevated left atrial pressure.   2. Right ventricular systolic function is normal. The right ventricular  size is normal. There is moderately elevated pulmonary artery systolic  pressure. The estimated right ventricular systolic pressure is 78.4 mmHg.   3. Left atrial size was severely dilated.   4. The mitral valve is normal in structure. Moderate mitral valve  regurgitation. Appears functional.   5. The aortic valve is tricuspid. Aortic valve regurgitation is mild.  Aortic valve sclerosis/calcification is present, without any evidence of  aortic stenosis.   6. The inferior vena cava is dilated in size with <50% respiratory  variability, suggesting right atrial pressure of 15 mmHg.    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 male with history of HTN, HLD, h/o CKD stage 3-4, gout, ischemic CMP (Multivessel CAD on LHC 01/2021- managed medically, not a candidate for CABG due to calcicifation in the aortic root, ascending aorta, not a candidate for PCI) LVEF  35-40%>> now 25% who is being seen 11/22/2021 for the evaluation of NSTEMI at the request of Dr. Darrick Meigs. Currently managing decompensated ischemic CM EF 30-35% with flash pulmonary edema in the setting of PNA, NSTEMI.  Assessment & Plan    Non-STEMI with known coronary artery disease Acute decompensated heart failure with systolic ejection fraction Ischemic cardiomyopathy recent ejection fraction 30-35% on echocardiogram done in November 22, 2021. Pulmonary hypertension  Moderate mitral regurgitation Pneumonia Acute respiratory failure with hypoxia-has resolved Anemia  He is currently dual antiplatelet therapy for the NSTEMI with his known coronary artery disease. His recent EGD does not show any evidence of GI bleeding, treatment plan for his acute blood loss anemia/iron deficiency anemia is IV iron.  He also is on Protonix 40 mg twice a day.  His hemoglobin is stable.  For now we will keep the patient on his dual antiplatelet therapy.  In terms of his ischemic cardiomyopathy EF 30 to 35% his kidney function with worsening creatinine is prohibiting [place] from restarting his ACE, ARB or Entresto.  He is euvolemic there is no need for any further diuretics at this time.  Acute respiratory failure has resolved.  Pneumonia per primary team.   For questions or updates, please contact [facility] Please consult [email or web address] for contact info under Cardiology/STEMI.      Signed, Berniece Salines, DO  11/27/2021, 9:55 AM

## 2021-11-27 NOTE — Progress Notes (Signed)
OT Cancellation Note  Patient Details Name: Randy [name] MRN: [id number] DOB: [birth year]   Cancelled Treatment:    Reason Eval/Treat Not Completed: OT screened, no needs identified, will sign off  Malka So 11/27/2021, 3:39 PM Nestor Lewandowsky, OTR/L Acute Rehabilitation Services Pager: [phone] Office: [phone]

## 2021-11-27 NOTE — Evaluation (Signed)
Physical Therapy Evaluation Patient Details Name: Randy [name] MRN: [id number] DOB: January 05, 1941 Today's Date: 11/27/2021  History of Present Illness  Pt is an 80 y.o. male admitted on 11/21/21 with acute respiratory failure with hypoxia and NSTEMI. CXR should edema. S/p EGD on 12/6 due to iron deficiency anemia and melena. PMH includes CAD, CKD stage IV, HTN. Pt speaks [place].  Clinical Impression  Difficult to obtain subjective history due to language barrier. Pt reports he is staying with a [facility]. He requested information on low-income housing - case manager notified. Pt is independent in all functional mobility. Will discharge acute PT at this time with no follow-up therapy recommended.        Recommendations for follow up therapy are one component of a multi-disciplinary discharge planning process, led by the attending physician.  Recommendations may be updated based on patient status, additional functional criteria and insurance authorization.  Follow Up Recommendations No PT follow up    Assistance Recommended at Discharge None  Functional Status Assessment    Equipment Recommendations  None recommended by PT    Recommendations for Other Services       Precautions / Restrictions Precautions Precautions: None Restrictions Weight Bearing Restrictions: No      Mobility  Bed Mobility Overal bed mobility: Independent                  Transfers Overall transfer level: Independent Equipment used: None                    Ambulation/Gait Ambulation/Gait assistance: Independent Gait Distance (Feet): 260 Feet Assistive device: None Gait Pattern/deviations: WFL(Within Functional Limits)   Gait velocity interpretation: >4.37 ft/sec, indicative of normal walking speed   General Gait Details: Pt able to walk with no DME or assistance. Even started jogging at times. Further ambulation deferred due to not being able to hear  interpreter.  Stairs            Wheelchair Mobility    Modified Rankin (Stroke Patients Only)       Balance Overall balance assessment: Independent   Sitting balance-Leahy Scale: Normal         Standing balance comment: Pt able to stand and ambulate with no DME or assist.               High Level Balance Comments: Can perform quick turns and don shoes while standing with no LOB.             Pertinent Vitals/Pain Pain Assessment: Faces Faces Pain Scale: No hurt    Home Living Family/patient expects to be discharged to:: Shelter/Homeless (reports staying in [facility] with a [facility])                   Additional Comments: Used Optometrist for subjective history. Pt states he has blankets and pillows. Pt wanted information about low income housing - case manager notified.    Prior Function Prior Level of Function : Independent/Modified Independent             Mobility Comments: Pt ambulated without the use of DME and does not want any at this time.       Hand Dominance        Extremity/Trunk Assessment   Upper Extremity Assessment Upper Extremity Assessment: Overall WFL for tasks assessed    Lower Extremity Assessment Lower Extremity Assessment: Overall WFL for tasks assessed       Communication   Communication:  Interpreter utilized  Cognition Arousal/Alertness: Awake/alert Behavior During Therapy: WFL for tasks assessed/performed Overall Cognitive Status: Difficult to assess (due to language barrier)                                 General Comments: Able to follow all simple verbal commands and gestures.        General Comments      Exercises     Assessment/Plan    PT Assessment Patient does not need any further PT services  PT Problem List         PT Treatment Interventions      PT Goals (Current goals can be found in the Care Plan section)  Acute Rehab PT Goals Patient Stated Goal: to get  information on low-income housing PT Goal Formulation: With patient Time For Goal Achievement: 12/11/21 Potential to Achieve Goals: Good    Frequency     Barriers to discharge        Co-evaluation               AM-PAC PT "6 Clicks" Mobility  Outcome Measure Help needed turning from your back to your side while in a flat bed without using bedrails?: None Help needed moving from lying on your back to sitting on the side of a flat bed without using bedrails?: None Help needed moving to and from a bed to a chair (including a wheelchair)?: None Help needed standing up from a chair using your arms (e.g., wheelchair or bedside chair)?: None Help needed to walk in hospital room?: None Help needed climbing 3-5 steps with a railing? : None 6 Click Score: 24    End of Session   Activity Tolerance: Patient tolerated treatment well Patient left: in bed;Other (comment) (with case manager in room) Nurse Communication: Mobility status PT Visit Diagnosis: Other abnormalities of gait and mobility (R26.89)    Time: 3358-2518 PT Time Calculation (min) (ACUTE ONLY): 21 min   Charges:   PT Evaluation $PT Eval Low Complexity: 1 Low         Brandon Melnick, SPT   [name] 11/27/2021, 4:20 PM

## 2021-11-27 NOTE — Progress Notes (Addendum)
PROGRESS NOTE                                                                                                                                                                                                             Patient Demographics:    Randy [name], is a 80 y.o. male, DOB - January 30, 1941, HFW:[id number]  Outpatient Primary MD for the patient is Jilda Panda, MD    LOS - 6  Admit date - 11/21/2021    Chief Complaint  Patient presents with   Shortness of Breath    [facility].  Arrived on [facility]. Slightly combative       Brief Narrative (HPI from H&P)   80 year old [place] male with a past medical history significant for CAD, CKD stage IV, dark stools in the past, hypertension who was admitted to the hospital with acute hypoxic and hypercapnic respiratory failure due to CHF and non-STEMI.   Subjective:    Randy [name] today has, No headache, No chest pain, No abdominal pain - No Nausea, No new weakness tingling or numbness, +ve SOB and cannot lay flat.   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  Have added Imdur and will add hydralazine if blood pressure can tolerate.  Still has orthopnea and crackles on exam, Lasix given.  Will monitor closely.  Continue supplemental oxygen upon exertion.  Cannot use ACE ARB/Entresto due to underlying CKD.   2.  CKD 4.  Baseline creatinine around 2 to 2.2.  Monitor with diuresis not a candidate for ACE, ARB or Entresto.  3.  NSTEMI.  Medical treatment only as in #1 above.  Seen by cardiology this admission.  4. CAP.  Has  finished antibiotics.  5.  Acute on chronic iron deficiency anemia with intermittent melanotic stools.  Seen by GI, underwent EGD, EGD was unremarkable no plans for colonoscopy due to poor underlying health status, plan is twice daily PPI which has been started, transfuse iron and blood products as needed goal to keep hemoglobin around 8  due to underlying CAD and ischemic cardiomyopathy.  Has received IV iron this admission.  We will also check L24 and folic acid levels  6.  Chronic weakness and deconditioning.  He lives alone and is estranged from his family.  PT OT, may require SNF.      Condition - Extremely Guarded  Family Communication  :  son [phone] on 11/27/21, he is estranged from his family.  Gets intermittent supervision from daughter at best.  Code Status :  Full  Consults  :  Cards, GI  PUD Prophylaxis : PPI   Procedures  :     11/22/2021   1. Left ventricular ejection fraction, by estimation, is 30 to 35%. The left ventricle has moderately decreased function. The left ventricle demonstrates global hypokinesis. There is moderate left ventricular  hypertrophy. Left ventricular diastolic parameters are consistent with Grade III diastolic dysfunction  (restrictive). Elevated left atrial pressure.   2. Right ventricular systolic function is normal. The right ventricular size is normal. There is moderately elevated pulmonary artery systolic pressure. The estimated right ventricular systolic pressure is 64.4 mmHg.   3. Left atrial size was severely dilated.   4. The mitral valve is normal in structure. Moderate mitral valve regurgitation. Appears functional.   5. The aortic valve is tricuspid. Aortic valve regurgitation is mild. Aortic valve sclerosis/calcification is present, without any evidence of aortic stenosis.   6. The inferior vena cava is dilated in size with <50% respiratory variability, suggesting right atrial pressure of 15 mmHg.     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      The stomach was normal.      The cardia and gastric fundus were normal on retroflexion.      The examined duodenum was normal. Impression:               - Normal esophagus.                          - Normal stomach.      Disposition Plan  :    Status is: Inpatient  Remains inpatient appropriate because: CHF   DVT Prophylaxis  :    Place TED hose Start: 11/27/21 1200 enoxaparin (LOVENOX) injection 30 mg Start: 11/24/21 1200 Place and maintain sequential compression device Start: 11/24/21 0832 SCDs Start: 11/21/21 2241    Lab Results  Component Value Date   PLT 715 (H) 11/27/2021    Diet :  Diet Order             Diet 2 gram sodium Room service appropriate? Yes; Fluid consistency: Thin; Fluid restriction: 1500 mL Fluid  Diet effective now                    Inpatient Medications  Scheduled Meds:  sodium chloride   Intravenous Once   aspirin EC  81 mg Oral Daily  atorvastatin  80 mg Oral Daily   carvedilol  3.125 mg Oral BID WC   clopidogrel  75 mg Oral Daily   enoxaparin (LOVENOX) injection  30 mg Subcutaneous Q24H   insulin aspart  0-6 Units Subcutaneous TID WC   pantoprazole  40 mg Oral BID   Continuous Infusions: PRN Meds:.acetaminophen **OR** acetaminophen, alum & mag hydroxide-simeth, hydrALAZINE  Antibiotics  :    Anti-infectives (From admission, onward)    Start     Dose/Rate Route Frequency Ordered Stop   11/22/21 1545   cefTRIAXone (ROCEPHIN) 2 g in sodium chloride 0.9 % 100 mL IVPB        2 g 200 mL/hr over 30 Minutes Intravenous Every 24 hours 11/22/21 1445 11/26/21 1553   11/22/21 1545  azithromycin (ZITHROMAX) 500 mg in sodium chloride 0.9 % 250 mL IVPB        500 mg 250 mL/hr over 60 Minutes Intravenous Every 24 hours 11/22/21 1445 11/26/21 1718        Time Spent in minutes  30   Lala Lund M.D on 11/27/2021 at 12:00 PM  To page go to [email or web address]   [facility] -  Office  [phone]  See all Orders from today for further details    Objective:   Vitals:   11/27/21 0220 11/27/21 0405 11/27/21 0836 11/27/21 1108  BP:  128/67 103/86 120/64  Pulse:  72 64 61  Resp:  20 19 20   Temp:  98.4 F (36.9 C) 97.6 F (36.4 C) 97.7 F (36.5 C)  TempSrc:  Oral    SpO2:  95% 97% 97%  Weight: 57.3 kg       Wt Readings from Last 3 Encounters:  11/27/21 57.3 kg     Intake/Output Summary (Last 24 hours) at 11/27/2021 1200 Last data filed at 11/27/2021 1141 Gross per 24 hour  Intake 937 ml  Output 1750 ml  Net -813 ml     Physical Exam  Awake Alert, No new F.N deficits, Normal affect [place].AT,PERRAL Supple Neck, No JVD,   Symmetrical Chest wall movement, Good air movement bilaterally, ++ rales RRR,No Gallops,Rubs or new Murmurs,  +ve B.Sounds, Abd Soft, No tenderness,   No Cyanosis, Clubbing or edema       Data Review:    CBC Recent Labs  Lab 11/21/21 1945 11/21/21 2006 11/22/21 0600 11/22/21 0628 11/23/21 0700 11/24/21 0206 11/25/21 0021 11/26/21 0312 11/27/21 0351  WBC 19.4*  --  17.8*  --  11.4* 10.2 9.5 9.7 10.3  HGB 11.1*   < > 10.3*   < > 8.6* 8.6* 8.5* 8.9* 9.0*  HCT 41.1   < > 36.8*   < > 29.8* 28.5* 29.4* 30.2* 30.3*  PLT 765*  --  616*  --  622* 562* 593* 624* 715*  MCV 77.8*  --  76.3*  --  73.2* 72.7* 74.2* 74.8* 74.8*  MCH 21.0*  --  21.4*  --  21.1* 21.9* 21.5* 22.0* 22.2*  MCHC 27.0*  --  28.0*  --  28.9* 30.2 28.9* 29.5* 29.7*  RDW 19.6*  --   19.6*  --  19.8* 20.5* 20.9* 21.2* 21.7*  LYMPHSABS 5.4*  --  1.4  --   --   --   --   --  1.3  MONOABS 1.6*  --  1.9*  --   --   --   --   --  0.9  EOSABS 0.3  --  0.1  --   --   --   --   --  0.3  BASOSABS 0.1  --  0.1  --   --   --   --   --  0.1   < > = values in this interval not displayed.    Electrolytes Recent Labs  Lab 11/21/21 2315 11/22/21 0600 11/22/21 0628 11/23/21 0700 11/23/21 1431 11/24/21 0749 11/25/21 0021 11/27/21 0351  NA  --  139   < > 135 134* 132* 133* 136  K  --  4.3   < > 4.3 4.1 4.7 4.6 4.4  CL  --  106  --  103 98 100 101 103  CO2  --  24  --  24 26 21* 22 22  GLUCOSE  --  139*  --  152* 169* 240* 129* 107*  BUN  --  57*  --  42* 42* 40* 42* 29*  CREATININE  --  1.68*  --  1.63* 1.81* 1.71* 2.06* 1.83*  CALCIUM  --  8.5*  --  8.2* 8.3* 8.4* 8.6* 8.5*  AST  --  46*  --   --  42*  --   --  25  ALT  --  48*  --   --  44  --   --  30  ALKPHOS  --  88  --   --  85  --   --  81  BILITOT  --  0.5  --   --  0.6  --   --  0.9  ALBUMIN  --  3.0*  --   --  3.1*  --   --  2.9*  MG  --  2.2  --   --   --   --   --  2.0  PROCALCITON  --  0.16  --   --   --   --   --   --   INR 1.0  --   --   --   --   --   --   --   HGBA1C  --  7.2*  --   --   --   --   --   --   BNP  --  2,954.8*  --   --   --   --   --  4,311.1*   < > = values in this interval not displayed.    ------------------------------------------------------------------------------------------------------------------ No results for input(s): CHOL, HDL, LDLCALC, TRIG, CHOLHDL, LDLDIRECT in the last 72 hours.  Lab Results  Component Value Date   HGBA1C 7.2 (H) 11/22/2021    No results for input(s): TSH, T4TOTAL, T3FREE, THYROIDAB in the last 72 hours.  Invalid input(s): FREET3 ------------------------------------------------------------------------------------------------------------------ ID Labs Recent Labs  Lab 11/22/21 0600 11/23/21 0700 11/23/21 1431 11/24/21 0206 11/24/21 0749  11/25/21 0021 11/26/21 0312 11/27/21 0351  WBC 17.8* 11.4*  --  10.2  --  9.5 9.7 10.3  PLT 616* 622*  --  562*  --  593* 624* 715*  PROCALCITON 0.16  --   --   --   --   --   --   --   CREATININE 1.68* 1.63* 1.81*  --  1.71* 2.06*  --  1.83*   Cardiac Enzymes No results for input(s): CKMB, TROPONINI, MYOGLOBIN in the last 168 hours.  Invalid input(s): CK       Micro Results No results found for this or any previous visit (from the past 240 hour(s)).  Radiology Reports DG CHEST PORT 1 VIEW  Result Date: 11/27/2021 CLINICAL DATA:  Difficulty  breathing EXAM: PORTABLE CHEST 1 VIEW COMPARISON:  Previous studies including the examination of 11/21/2021 FINDINGS: Transverse diameter of heart is increased. There is interval improvement in aeration of both lungs suggesting resolving pulmonary edema. There is some residual prominence of interstitial markings in the parahilar regions. There are no new focal pulmonary infiltrates. Crowding of markings is seen in the both lower lung fields. There is minimal blunting of lateral CP angles. There is no pneumothorax. IMPRESSION: Cardiomegaly. There is interval clearing of alveolar pulmonary edema. There is slight prominence of interstitial markings in the parahilar regions suggesting residual mild interstitial edema or interstitial pneumonitis. No new focal pulmonary infiltrates are seen. Possible minimal pleural effusions. There is no pneumothorax. Electronically Signed   By: Elmer Picker M.D.   On: 11/27/2021 09:05

## 2021-11-27 NOTE — Anesthesia Postprocedure Evaluation (Signed)
Anesthesia Post Note  Patient: Ridley Shanks  Procedure(s) Performed: ESOPHAGOGASTRODUODENOSCOPY (EGD) WITH PROPOFOL     Patient location during evaluation: Endoscopy Anesthesia Type: MAC Level of consciousness: awake and alert Pain management: pain level controlled Vital Signs Assessment: post-procedure vital signs reviewed and stable Respiratory status: spontaneous breathing, nonlabored ventilation, respiratory function stable and patient connected to nasal cannula oxygen Cardiovascular status: stable and blood pressure returned to baseline Postop Assessment: no apparent nausea or vomiting Anesthetic complications: no   No notable events documented.  Last Vitals:  Vitals:   11/27/21 0836 11/27/21 1108  BP: 103/86 120/64  Pulse: 64 61  Resp: 19 20  Temp: 36.4 C 36.5 C  SpO2: 97% 97%    Last Pain:  Vitals:   11/27/21 1139  TempSrc:   PainSc: 0-No pain                 Nasim Garofano

## 2021-11-27 NOTE — Anesthesia Postprocedure Evaluation (Signed)
Anesthesia Post Note  Patient: Randy [name]  Procedure(s) Performed: ESOPHAGOGASTRODUODENOSCOPY (EGD) WITH PROPOFOL     Patient location during evaluation: Endoscopy Anesthesia Type: MAC Level of consciousness: awake and alert Pain management: pain level controlled Vital Signs Assessment: post-procedure vital signs reviewed and stable Respiratory status: spontaneous breathing, nonlabored ventilation, respiratory function stable and patient connected to nasal cannula oxygen Cardiovascular status: stable and blood pressure returned to baseline Postop Assessment: no apparent nausea or vomiting Anesthetic complications: no   No notable events documented.  Last Vitals:  Vitals:   11/27/21 0836 11/27/21 1108  BP: 103/86 120/64  Pulse: 64 61  Resp: 19 20  Temp: 36.4 C 36.5 C  SpO2: 97% 97%    Last Pain:  Vitals:   11/27/21 1139  TempSrc:   PainSc: 0-No pain                 Aanshi Batchelder

## 2021-11-27 NOTE — TOC Progression Note (Addendum)
Transition of Care [facility]) - Progression Note    Patient Details  Name: Randy [name] MRN: [id number] Date of Birth: Jan 04, 1941  [facility]) CM/SW Contact  Zenon Mayo, RN Phone Number: 11/27/2021, 2:47 PM  Clinical Narrative:    NCM spoke with patient at the bedside with the interpreter, NCM asked patient where is he going when he leaves the hospital , he kept telling the intrepreter something about low income housing, avoiding the question.  NCM offered to set up [facility] services for him. He states the last time he had [place] services he wen to the store to get some food and they came by when he was not at home.  NCM tried to call the number on the chart because he states he lives with owner, call did not go thru.  PT/OT eval has been ordered. Physical therapy will see him today.  Patient unplugged himself from the monitor and the foley and went to the bathroom.  NCM will plan to set up [facility] services. , Per physical therapy he is independent and does not need therapy.  He states he would like to get housing resources and transportation to go home tomorrow.  NCM gave him the housing resources. MD states patient has no way to get his medications.  NCM contacted patient son, he states he lives in [place] and that I should call the daughter.  NCM called the daughter and left message for her to return call.  NCM asked patient how much is his check per month, he states around 600.00 or 700.00 sometimes it is different.  He pays 300.00 to the [facility] and he can only pay 350.00 for a boarding house, NCM contacted Mrs Rosana Hoes at the boarding house on [address] she states her boarding house is 600.00 a month, she was willing to work with him for 500.00 a month but he states he can not do that, he states he will continue to stay at the [facility] for 300.00.  he will need transport set up to go to doctors apt once a month, he will need blood work per MD for CBC, CMP, and Mag a week  from now at PCP office.        Expected Discharge Plan and Services                                                 Social Determinants of Health (SDOH) Interventions    Readmission Risk Interventions No flowsheet data found.

## 2021-11-28 ENCOUNTER — Other Ambulatory Visit (HOSPITAL_COMMUNITY): Payer: Self-pay

## 2021-11-28 DIAGNOSIS — J9602 Acute respiratory failure with hypercapnia: Secondary | ICD-10-CM | POA: Diagnosis not present

## 2021-11-28 DIAGNOSIS — J9601 Acute respiratory failure with hypoxia: Secondary | ICD-10-CM | POA: Diagnosis not present

## 2021-11-28 LAB — BRAIN NATRIURETIC PEPTIDE: B Natriuretic Peptide: 3057.4 pg/mL — ABNORMAL HIGH (ref 0.0–100.0)

## 2021-11-28 LAB — COMPREHENSIVE METABOLIC PANEL
ALT: 29 U/L (ref 0–44)
AST: 27 U/L (ref 15–41)
Albumin: 3 g/dL — ABNORMAL LOW (ref 3.5–5.0)
Alkaline Phosphatase: 79 U/L (ref 38–126)
Anion gap: 9 (ref 5–15)
BUN: 31 mg/dL — ABNORMAL HIGH (ref 8–23)
CO2: 25 mmol/L (ref 22–32)
Calcium: 8.7 mg/dL — ABNORMAL LOW (ref 8.9–10.3)
Chloride: 101 mmol/L (ref 98–111)
Creatinine, Ser: 2.26 mg/dL — ABNORMAL HIGH (ref 0.61–1.24)
GFR, Estimated: 28 mL/min — ABNORMAL LOW (ref >60)
Glucose, Bld: 103 mg/dL — ABNORMAL HIGH (ref 70–99)
Potassium: 4.4 mmol/L (ref 3.5–5.1)
Sodium: 135 mmol/L (ref 135–145)
Total Bilirubin: 0.6 mg/dL (ref 0.3–1.2)
Total Protein: 6.4 g/dL — ABNORMAL LOW (ref 6.5–8.1)

## 2021-11-28 LAB — MAGNESIUM: Magnesium: 2 mg/dL (ref 1.7–2.4)

## 2021-11-28 LAB — CBC WITH DIFFERENTIAL/PLATELET
Abs Immature Granulocytes: 0 10*3/uL (ref 0.00–0.07)
Basophils Absolute: 0 10*3/uL (ref 0.0–0.1)
Basophils Relative: 0 %
Eosinophils Absolute: 0.5 10*3/uL (ref 0.0–0.5)
Eosinophils Relative: 6 %
HCT: 32.9 % — ABNORMAL LOW (ref 39.0–52.0)
Hemoglobin: 9.7 g/dL — ABNORMAL LOW (ref 13.0–17.0)
Lymphocytes Relative: 10 %
Lymphs Abs: 0.9 10*3/uL (ref 0.7–4.0)
MCH: 21.9 pg — ABNORMAL LOW (ref 26.0–34.0)
MCHC: 29.5 g/dL — ABNORMAL LOW (ref 30.0–36.0)
MCV: 74.3 fL — ABNORMAL LOW (ref 80.0–100.0)
Monocytes Absolute: 0.8 10*3/uL (ref 0.1–1.0)
Monocytes Relative: 9 %
Neutro Abs: 6.5 10*3/uL (ref 1.7–7.7)
Neutrophils Relative %: 75 %
Platelets: 785 10*3/uL — ABNORMAL HIGH (ref 150–400)
RBC: 4.43 MIL/uL (ref 4.22–5.81)
RDW: 22.2 % — ABNORMAL HIGH (ref 11.5–15.5)
WBC: 8.7 10*3/uL (ref 4.0–10.5)
nRBC: 0 % (ref 0.0–0.2)
nRBC: 0 /100 WBC

## 2021-11-28 LAB — BPAM RBC
Blood Product Expiration Date: 202212122359
Unit Type and Rh: 7300

## 2021-11-28 LAB — GLUCOSE, CAPILLARY: Glucose-Capillary: 108 mg/dL — ABNORMAL HIGH (ref 70–99)

## 2021-11-28 LAB — TYPE AND SCREEN
ABO/RH(D): B POS
Antibody Screen: NEGATIVE
Unit division: 0

## 2021-11-28 MED ORDER — ASPIRIN 81 MG PO TBEC
81.0000 mg | DELAYED_RELEASE_TABLET | Freq: Every day | ORAL | 0 refills | Status: DC
  Filled 2021-11-28: qty 30, 30d supply, fill #0

## 2021-11-28 MED ORDER — FUROSEMIDE 80 MG PO TABS
80.0000 mg | ORAL_TABLET | Freq: Every day | ORAL | 0 refills | Status: DC
  Filled 2021-11-28: qty 30, 30d supply, fill #0

## 2021-11-28 MED ORDER — FUROSEMIDE 40 MG PO TABS
40.0000 mg | ORAL_TABLET | Freq: Once | ORAL | Status: AC
  Administered 2021-11-28: 40 mg via ORAL
  Filled 2021-11-28: qty 1

## 2021-11-28 MED ORDER — CLOPIDOGREL BISULFATE 75 MG PO TABS
75.0000 mg | ORAL_TABLET | Freq: Every day | ORAL | 0 refills | Status: DC
  Filled 2021-11-28: qty 30, 30d supply, fill #0

## 2021-11-28 MED ORDER — CARVEDILOL 3.125 MG PO TABS
3.1250 mg | ORAL_TABLET | Freq: Two times a day (BID) | ORAL | 0 refills | Status: DC
  Filled 2021-11-28: qty 60, 30d supply, fill #0

## 2021-11-28 MED ORDER — ATORVASTATIN CALCIUM 80 MG PO TABS
80.0000 mg | ORAL_TABLET | Freq: Every day | ORAL | 0 refills | Status: DC
  Filled 2021-11-28: qty 30, 30d supply, fill #0

## 2021-11-28 MED ORDER — PANTOPRAZOLE SODIUM 40 MG PO TBEC
40.0000 mg | DELAYED_RELEASE_TABLET | Freq: Two times a day (BID) | ORAL | 0 refills | Status: DC
  Filled 2021-11-28: qty 60, 30d supply, fill #0

## 2021-11-28 MED ORDER — FERROUS SULFATE 325 (65 FE) MG PO TABS
325.0000 mg | ORAL_TABLET | Freq: Two times a day (BID) | ORAL | 0 refills | Status: DC
  Filled 2021-11-28: qty 60, 30d supply, fill #0

## 2021-11-28 MED ORDER — DOCUSATE SODIUM 100 MG PO CAPS
100.0000 mg | ORAL_CAPSULE | Freq: Two times a day (BID) | ORAL | 0 refills | Status: AC
  Filled 2021-11-28: qty 60, 30d supply, fill #0

## 2021-11-28 MED ORDER — TAMSULOSIN HCL 0.4 MG PO CAPS
0.8000 mg | ORAL_CAPSULE | Freq: Every day | ORAL | 0 refills | Status: AC
  Filled 2021-11-28: qty 30, 15d supply, fill #0

## 2021-11-28 MED ORDER — ISOSORB DINITRATE-HYDRALAZINE 20-37.5 MG PO TABS
1.0000 | ORAL_TABLET | Freq: Three times a day (TID) | ORAL | 0 refills | Status: DC
  Filled 2021-11-28: qty 90, 30d supply, fill #0

## 2021-11-28 NOTE — Progress Notes (Signed)
Secretary coordinated  with transport  and as per pt he needs to be dropped off to the bank in 4 seasons and  claimed that he will take care of himself [facility].

## 2021-11-28 NOTE — Plan of Care (Signed)

## 2021-11-28 NOTE — Progress Notes (Signed)
Discharged home with [facility] transport.
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
## 2021-11-28 NOTE — Discharge Instructions (Signed)
Follow with Primary MD Jilda Panda, MD in 7 days   Get CBC, CMP, 2 view Chest X ray -  checked next visit within 1 week by Primary MD    Activity: As tolerated with Full fall precautions use walker/cane & assistance as needed  Disposition Home   Diet: Heart Healthy with strict 1.5 lit /day fluid restriction  Check your Weight same time everyday, if you gain over 2 pounds, or you develop in leg swelling, experience more shortness of breath or chest pain, call your Primary MD immediately. Follow Cardiac Low Salt Diet and 1.5 lit/day fluid restriction.  Special Instructions: If you have smoked or chewed Tobacco  in the last 2 yrs please stop smoking, stop any regular Alcohol  and or any Recreational drug use.  On your next visit with your primary care physician please Get Medicines reviewed and adjusted.  Please request your Prim.MD to go over all Hospital Tests and Procedure/Radiological results at the follow up, please get all Hospital records sent to your Prim MD by signing hospital release before you go home.  If you experience worsening of your admission symptoms, develop shortness of breath, life threatening emergency, suicidal or homicidal thoughts you must seek medical attention immediately by calling 911 or calling your MD immediately  if symptoms less severe.  You Must read complete instructions/literature along with all the possible adverse reactions/side effects for all the Medicines you take and that have been prescribed to you. Take any new Medicines after you have completely understood and accpet all the possible adverse reactions/side effects.

## 2021-11-28 NOTE — Progress Notes (Signed)
Pt left a wallet with nothing on it as checked by Network engineer. The latter tried to call in his phone but to no avail. Wallet to be mailed  in his address.

## 2021-11-28 NOTE — Progress Notes (Signed)
Pt speaks laotian ,a little english but understands english well.

## 2021-11-28 NOTE — Discharge Summary (Signed)
Randy [name] OFB:[id number] DOB: [birth year] DOA: 11/21/2021  PCP: Jilda Panda, MD  Admit date: 11/21/2021  Discharge date: 11/28/2021  Admitted From: Home   Disposition:  Home   Recommendations for Outpatient Follow-up:   Follow up with PCP in 1-2 weeks  PCP Please obtain BMP/CBC, 2 view CXR in 1week,  (see Discharge instructions)   PCP Please follow up on the following pending results: monitor BMP, BP cloasely, outpt Cards and Renal follow up   Home Health: PT,OT, RN if qualifies Equipment/Devices: as below  Consultations: Cards, GI Discharge Condition: Stable    CODE STATUS: Full    Diet Recommendation: Heart Healthy 1.5 lit/day fluid restriction  Diet Order             Diet 2 gram sodium Room service appropriate? Yes; Fluid consistency: Thin; Fluid restriction: 1500 mL Fluid  Diet effective now                    Chief Complaint  Patient presents with   Shortness of Breath    [facility].  Arrived on NRB. Slightly combative     Brief history of present illness from the day of admission and additional interim summary    80 year old [place] male with a past medical history significant for CAD, CKD stage IV, dark stools in the past, hypertension who was admitted to the hospital with acute hypoxic and hypercapnic respiratory failure due to CHF and non-STEMI.                                                                   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  Have added Imdur and will add hydralazine if blood pressure can tolerate.  Still has orthopnea and crackles on exam, Lasix given.  Will monitor closely. Now on RA and symptom free, DC home on Home dose Lasix, Coreg and Bidil with Cards and PCP follow up.  Cannot use ACE ARB/Entresto due to underlying CKD.     2.  CKD 4.  Baseline creatinine around 2 to 2.2.  Monitor with diuresis not a candidate for ACE, ARB or Entresto.   3.  NSTEMI.  Medical treatment only as in #1 above.  Seen by cardiology this admission.   4. CAP.  Has finished antibiotics.   5.  Acute on chronic iron deficiency anemia with intermittent melanotic stools.  Seen by GI, underwent EGD, EGD was unremarkable no plans for colonoscopy due to poor underlying  health status, plan is twice daily PPI which has been started, transfuse iron and blood products as needed goal to keep hemoglobin around 8 due to underlying CAD and ischemic cardiomyopathy.  Has received IV iron this admission.  Will get PO Iron and PPI upon DC.   6.  Chronic weakness and deconditioning.  He lives alone and is estranged from his family.  PT OT, DC plan made by TOC.    Discharge diagnosis     Principal Problem:   Acute respiratory failure with hypoxia and hypercapnia (HCC) Active Problems:   SOB (shortness of breath)   Elevated troponin   Hyperglycemia   Blood creatinine increased compared with prior measurement    Discharge instructions    Discharge Instructions     Discharge instructions   Complete by: As directed    Follow with Primary MD Jilda Panda, MD in 7 days   Get CBC, CMP, 2 view Chest X ray -  checked next visit within 1 week by Primary MD    Activity: As tolerated with Full fall precautions use walker/cane & assistance as needed  Disposition Home   Diet: Heart Healthy with strict 1.5 lit /day fluid restriction  Check your Weight same time everyday, if you gain over 2 pounds, or you develop in leg  swelling, experience more shortness of breath or chest pain, call your Primary MD immediately. Follow Cardiac Low Salt Diet and 1.5 lit/day fluid restriction.  Special Instructions: If you have smoked or chewed Tobacco  in the last 2 yrs please stop smoking, stop any regular Alcohol  and or any Recreational drug use.  On your next visit with your primary care physician please Get Medicines reviewed and adjusted.  Please request your Prim.MD to go over all Hospital Tests and Procedure/Radiological results at the follow up, please get all Hospital records sent to your Prim MD by signing hospital release before you go home.  If you experience worsening of your admission symptoms, develop shortness of breath, life threatening emergency, suicidal or homicidal thoughts you must seek medical attention immediately by calling 911 or calling your MD immediately  if symptoms less severe.  You Must read complete instructions/literature along with all the possible adverse reactions/side effects for all the Medicines you take and that have been prescribed to you. Take any new Medicines after you have completely understood and accpet all the possible adverse reactions/side effects.   Increase activity slowly   Complete by: As directed        Discharge Medications   Allergies as of 11/28/2021   No Known Allergies      Medication List     STOP taking these medications    amoxicillin 250 MG capsule Commonly known as: AMOXIL   cephALEXin 500 MG capsule Commonly known as: KEFLEX   finasteride 5 MG tablet Commonly known as: PROSCAR   Jardiance 10 MG Tabs tablet Generic drug: empagliflozin       TAKE these medications    aspirin 81 MG EC tablet Take 1 tablet (81 mg total) by mouth daily. Swallow whole.   atorvastatin 80 MG tablet Commonly known as: LIPITOR Take 1 tablet (80 mg total) by mouth daily.   carvedilol 3.125 MG tablet Commonly known as: COREG Take 1 tablet (3.125 mg total)  by mouth 2 (two) times daily.   clopidogrel 75 MG tablet Commonly known as: PLAVIX Take 1 tablet (75 mg total) by mouth daily.   docusate sodium 100 MG capsule Commonly known as: Colace  Take 1 capsule (100 mg total) by mouth 2 (two) times daily.   ferrous sulfate 325 (65 FE) MG tablet Take 1 tablet (325 mg total) by mouth 2 (two) times daily with a meal. What changed: when to take this   furosemide 80 MG tablet Commonly known as: LASIX Take 1 tablet (80 mg total) by mouth daily.   isosorbide-hydrALAZINE 20-37.5 MG tablet Commonly known as: BIDIL Take 1 tablet by mouth 3 (three) times daily.   metFORMIN 500 MG tablet Commonly known as: GLUCOPHAGE Take 500 mg by mouth in the morning and at bedtime.   pantoprazole 40 MG tablet Commonly known as: PROTONIX Take 1 tablet (40 mg total) by mouth 2 (two) times daily before a meal. What changed: when to take this   tamsulosin 0.4 MG Caps capsule Commonly known as: FLOMAX Take 2 capsules (0.8 mg total) by mouth daily.   TYLENOL PO Take 3 tablets by mouth 2 (two) times daily as needed (pain/headache).               Durable Medical Equipment  (From admission, onward)           Start     Ordered   11/27/21 0752  For home use only DME Walker rolling  Once       Comments: 5 wheel  Question Answer Comment  Walker: With [employer] Wheels   Patient needs a walker to treat with the following condition Weakness      11/27/21 0751             Follow-up Information     Jilda Panda, MD. Schedule an appointment as soon as possible for a visit in 1 week(s).   Specialty: Internal Medicine Contact information: [address] [place] [address] [phone]         Jerline Pain, MD. Schedule an appointment as soon as possible for a visit in 1 week(s).   Specialty: Cardiology Why: CHF Contact information: [address] [place] [address] [phone]                 Major  procedures and Radiology Reports - PLEASE review detailed and final reports thoroughly  -       US RENAL  Result Date: 11/27/2021 CLINICAL DATA:  Acute kidney injury EXAM: RENAL / URINARY TRACT ULTRASOUND COMPLETE COMPARISON:  None. FINDINGS: Right Kidney: Renal measurements: 8.3 x 4.9 x 3.5 cm = volume: 75 mL. Echogenicity within normal limits. No mass or hydronephrosis visualized. Left Kidney: Renal measurements: 9.4 x 7.7 x 3.6 cm = volume: 82 mL. Echogenicity within normal limits. No mass or hydronephrosis visualized. Urinary bladder: Appears normal for degree of bladder distention. Other: None. IMPRESSION: Unremarkable renal ultrasound. Electronically Signed   By: Iven Finn M.D.   On: 11/27/2021 20:41   DG CHEST PORT 1 VIEW  Result Date: 11/27/2021 CLINICAL DATA:  Difficulty breathing EXAM: PORTABLE CHEST 1 VIEW COMPARISON:  Previous studies including the examination of 11/21/2021 FINDINGS: Transverse diameter of heart is increased. There is interval improvement in aeration of both lungs suggesting resolving pulmonary edema. There is some residual prominence of interstitial markings in the parahilar regions. There are no new focal pulmonary infiltrates. Crowding of markings is seen in the both lower lung fields. There is minimal blunting of lateral CP angles. There is no pneumothorax. IMPRESSION: Cardiomegaly. There is interval clearing of alveolar pulmonary edema. There is slight prominence of interstitial markings in the parahilar regions suggesting residual  mild interstitial edema or interstitial pneumonitis. No new focal pulmonary infiltrates are seen. Possible minimal pleural effusions. There is no pneumothorax. Electronically Signed   By: Elmer Picker M.D.   On: 11/27/2021 09:05   DG Chest Port 1 View  Result Date: 11/21/2021 CLINICAL DATA:  Respiratory distress EXAM: PORTABLE CHEST 1 VIEW COMPARISON:  None. FINDINGS: Hazy bilateral opacities, likely pulmonary edema. No  pneumothorax or sizable pleural effusion. Cardiomediastinal contours are normal. IMPRESSION: Bilateral hazy opacities, likely pulmonary edema. Electronically Signed   By: Ulyses Jarred M.D.   On: 11/21/2021 20:10   ECHOCARDIOGRAM COMPLETE  Result Date: 11/22/2021    ECHOCARDIOGRAM REPORT   Patient Name:   Randy [name] Date of Exam: 11/22/2021 Medical Rec #:  [id number]          Height: Accession #:    [id number]         Weight: Date of Birth:  [birth year]           BSA: Patient Age:    41 years           BP:           127/71 mmHg Patient Gender: M                  HR:           73 bpm. Exam Location:  Inpatient Procedure: 2D Echo, Cardiac Doppler and Color Doppler Indications:    CHF  History:        Patient has no prior history of Echocardiogram examinations.                 Signs/Symptoms:Shortness of Breath.  Sonographer:    Glo Herring Referring Phys: [id number] [place]  1. Left ventricular ejection fraction, by estimation, is 30 to 35%. The left ventricle has moderately decreased function. The left ventricle demonstrates global hypokinesis. There is moderate left ventricular hypertrophy. Left ventricular diastolic parameters are consistent with Grade III diastolic dysfunction (restrictive). Elevated left atrial pressure.  2. Right ventricular systolic function is normal. The right ventricular size is normal. There is moderately elevated pulmonary artery systolic pressure. The estimated right ventricular systolic pressure is 07.6 mmHg.  3. Left atrial size was severely dilated.  4. The mitral valve is normal in structure. Moderate mitral valve regurgitation. Appears functional.  5. The aortic valve is tricuspid. Aortic valve regurgitation is mild. Aortic valve sclerosis/calcification is present, without any evidence of aortic stenosis.  6. The inferior vena cava is dilated in size with <50% respiratory variability, suggesting right atrial pressure of 15 mmHg. FINDINGS  Left Ventricle:  Left ventricular ejection fraction, by estimation, is 30 to 35%. The left ventricle has moderately decreased function. The left ventricle demonstrates global hypokinesis. The left ventricular internal cavity size was normal in size. There is moderate left ventricular hypertrophy. Left ventricular diastolic parameters are consistent with Grade III diastolic dysfunction (restrictive). Elevated left atrial pressure. Right Ventricle: The right ventricular size is normal. No increase in right ventricular wall thickness. Right ventricular systolic function is normal. There is moderately elevated pulmonary artery systolic pressure. The tricuspid regurgitant velocity is 3.04 m/s, and with an assumed right atrial pressure of 15 mmHg, the estimated right ventricular systolic pressure is 22.6 mmHg. Left Atrium: Left atrial size was severely dilated. Right Atrium: Right atrial size was normal in size. Pericardium: Trivial pericardial effusion is present. Mitral Valve: The mitral valve is normal in structure. Moderate mitral valve regurgitation. Tricuspid Valve: The tricuspid valve  is normal in structure. Tricuspid valve regurgitation is trivial. Aortic Valve: The aortic valve is tricuspid. Aortic valve regurgitation is mild. Aortic valve sclerosis/calcification is present, without any evidence of aortic stenosis. Pulmonic Valve: The pulmonic valve was not well visualized. Pulmonic valve regurgitation is trivial. Aorta: The aortic root and ascending aorta are structurally normal, with no evidence of dilitation. Venous: The inferior vena cava is dilated in size with less than 50% respiratory variability, suggesting right atrial pressure of 15 mmHg. IAS/Shunts: The interatrial septum was not well visualized.  LEFT VENTRICLE PLAX 2D LVIDd:         4.90 cm      Diastology LVIDs:         4.20 cm      LV e' medial:    3.15 cm/s LV PW:         1.40 cm      LV E/e' medial:  31.1 LV IVS:        1.40 cm      LV e' lateral:   9.57 cm/s  LVOT diam:     2.00 cm      LV E/e' lateral: 10.3 LVOT Area:     3.14 cm  LV Volumes (MOD) LV vol d, MOD A2C: 149.0 ml LV vol d, MOD A4C: 162.0 ml LV vol s, MOD A2C: 100.0 ml LV vol s, MOD A4C: 108.0 ml LV SV MOD A2C:     49.0 ml LV SV MOD A4C:     162.0 ml LV SV MOD BP:      49.3 ml RIGHT VENTRICLE             IVC RV Basal diam:  3.50 cm     IVC diam: 2.60 cm RV S prime:     15.10 cm/s LEFT ATRIUM             RIGHT ATRIUM LA diam:        4.90 cm RA Area:     18.80 cm LA Vol (A2C):   91.7 ml RA Volume:   48.70 ml LA Vol (A4C):   87.6 ml LA Biplane Vol: 91.8 ml                        PULMONIC VALVE AORTA                 PV Vmax:       0.92 m/s Ao Root diam: 3.40 cm PV Peak grad:  3.4 mmHg Ao Asc diam:  3.10 cm  MITRAL VALVE               TRICUSPID VALVE MV Area (PHT): 4.63 cm    TR Peak grad:   37.0 mmHg MV Decel Time: 164 msec    TR Vmax:        304.00 cm/s MV E velocity: 98.10 cm/s MV A velocity: 44.60 cm/s  SHUNTS MV E/A ratio:  2.20        Systemic Diam: 2.00 cm Oswaldo Milian MD Electronically signed by Oswaldo Milian MD Signature Date/Time: 11/22/2021/2:46:10 PM    Final     Today   Subjective    Randy [name] today has no headache,no chest abdominal pain,no new weakness tingling or numbness, feels much better wants to go home today.     Objective   Blood pressure 118/67, pulse 69, temperature 97.8 F (36.6 C), temperature source Oral, resp. rate 13, weight 55.8 kg, SpO2 96 %.  Intake/Output Summary (Last 24 hours) at 11/28/2021 0829 Last data filed at 11/28/2021 0813 Gross per 24 hour  Intake 550 ml  Output 2675 ml  Net -2125 ml    Exam  Awake Alert, No new F.N deficits, Normal affect [place].AT,PERRAL Supple Neck,No JVD, No cervical lymphadenopathy appriciated.  Symmetrical Chest wall movement, Good air movement bilaterally, CTAB RRR,No Gallops,Rubs or new Murmurs, No Parasternal Heave +ve B.Sounds, Abd Soft, Non tender, No organomegaly appriciated, No rebound  -guarding or rigidity. No Cyanosis, Clubbing or edema, No new Rash or bruise   Data Review   CBC w Diff:  Lab Results  Component Value Date   WBC 8.7 11/28/2021   HGB 9.7 (L) 11/28/2021   HCT 32.9 (L) 11/28/2021   PLT 785 (H) 11/28/2021   LYMPHOPCT 10 11/28/2021   MONOPCT 9 11/28/2021   EOSPCT 6 11/28/2021   BASOPCT 0 11/28/2021    CMP:  Lab Results  Component Value Date   NA 135 11/28/2021   K 4.4 11/28/2021   CL 101 11/28/2021   CO2 25 11/28/2021   BUN 31 (H) 11/28/2021   CREATININE 2.26 (H) 11/28/2021   PROT 6.4 (L) 11/28/2021   ALBUMIN 3.0 (L) 11/28/2021   BILITOT 0.6 11/28/2021   ALKPHOS 79 11/28/2021   AST 27 11/28/2021   ALT 29 11/28/2021  .   Total Time in preparing paper work, data evaluation and todays exam - 72 minutes  Lala Lund M.D on 11/28/2021 at 8:29 AM  [facility]

## 2021-11-28 NOTE — Progress Notes (Signed)
Prescribed meds to take home delivered , discharged instructions given to pt with the interpreter . Awaiting ride provided by [place] . Belongings with pt.

## 2021-11-29 ENCOUNTER — Encounter (HOSPITAL_COMMUNITY): Payer: Self-pay | Admitting: Internal Medicine

## 2021-11-29 LAB — PATHOLOGIST SMEAR REVIEW

## 2021-12-11 ENCOUNTER — Encounter (HOSPITAL_COMMUNITY): Payer: Self-pay | Admitting: *Deleted

## 2021-12-11 ENCOUNTER — Other Ambulatory Visit: Payer: Self-pay

## 2021-12-11 ENCOUNTER — Inpatient Hospital Stay (HOSPITAL_COMMUNITY)
Admission: EM | Admit: 2021-12-11 | Discharge: 2021-12-14 | DRG: 280 | Disposition: A | Discharge status: Home Health | Payer: Medicare Other | Attending: Family Medicine | Admitting: Family Medicine

## 2021-12-11 DIAGNOSIS — H9190 Unspecified hearing loss, unspecified ear: Secondary | ICD-10-CM | POA: Diagnosis present

## 2021-12-11 DIAGNOSIS — I5043 Acute on chronic combined systolic (congestive) and diastolic (congestive) heart failure: Secondary | ICD-10-CM | POA: Diagnosis present

## 2021-12-11 DIAGNOSIS — I5023 Acute on chronic systolic (congestive) heart failure: Secondary | ICD-10-CM

## 2021-12-11 DIAGNOSIS — Z603 Acculturation difficulty: Secondary | ICD-10-CM | POA: Diagnosis present

## 2021-12-11 DIAGNOSIS — I2510 Atherosclerotic heart disease of native coronary artery without angina pectoris: Secondary | ICD-10-CM | POA: Diagnosis present

## 2021-12-11 DIAGNOSIS — Z7982 Long term (current) use of aspirin: Secondary | ICD-10-CM

## 2021-12-11 DIAGNOSIS — N184 Chronic kidney disease, stage 4 (severe): Secondary | ICD-10-CM | POA: Diagnosis present

## 2021-12-11 DIAGNOSIS — J9601 Acute respiratory failure with hypoxia: Secondary | ICD-10-CM | POA: Diagnosis present

## 2021-12-11 DIAGNOSIS — I248 Other forms of acute ischemic heart disease: Secondary | ICD-10-CM | POA: Diagnosis present

## 2021-12-11 DIAGNOSIS — E1122 Type 2 diabetes mellitus with diabetic chronic kidney disease: Secondary | ICD-10-CM | POA: Diagnosis present

## 2021-12-11 DIAGNOSIS — I130 Hypertensive heart and chronic kidney disease with heart failure and stage 1 through stage 4 chronic kidney disease, or unspecified chronic kidney disease: Principal | ICD-10-CM | POA: Diagnosis present

## 2021-12-11 DIAGNOSIS — R7989 Other specified abnormal findings of blood chemistry: Secondary | ICD-10-CM | POA: Diagnosis present

## 2021-12-11 DIAGNOSIS — M109 Gout, unspecified: Secondary | ICD-10-CM | POA: Diagnosis present

## 2021-12-11 DIAGNOSIS — N179 Acute kidney failure, unspecified: Secondary | ICD-10-CM | POA: Diagnosis present

## 2021-12-11 DIAGNOSIS — D72829 Elevated white blood cell count, unspecified: Secondary | ICD-10-CM | POA: Diagnosis present

## 2021-12-11 DIAGNOSIS — E785 Hyperlipidemia, unspecified: Secondary | ICD-10-CM | POA: Diagnosis present

## 2021-12-11 DIAGNOSIS — Z20822 Contact with and (suspected) exposure to covid-19: Secondary | ICD-10-CM | POA: Diagnosis present

## 2021-12-11 DIAGNOSIS — N1832 Chronic kidney disease, stage 3b: Secondary | ICD-10-CM | POA: Diagnosis present

## 2021-12-11 DIAGNOSIS — J449 Chronic obstructive pulmonary disease, unspecified: Secondary | ICD-10-CM | POA: Diagnosis present

## 2021-12-11 DIAGNOSIS — I255 Ischemic cardiomyopathy: Secondary | ICD-10-CM | POA: Diagnosis present

## 2021-12-11 DIAGNOSIS — I10 Essential (primary) hypertension: Secondary | ICD-10-CM | POA: Diagnosis present

## 2021-12-11 DIAGNOSIS — R0602 Shortness of breath: Secondary | ICD-10-CM | POA: Diagnosis not present

## 2021-12-11 DIAGNOSIS — Z7984 Long term (current) use of oral hypoglycemic drugs: Secondary | ICD-10-CM

## 2021-12-11 DIAGNOSIS — Z7902 Long term (current) use of antithrombotics/antiplatelets: Secondary | ICD-10-CM

## 2021-12-11 DIAGNOSIS — I214 Non-ST elevation (NSTEMI) myocardial infarction: Secondary | ICD-10-CM | POA: Diagnosis present

## 2021-12-11 DIAGNOSIS — R778 Other specified abnormalities of plasma proteins: Secondary | ICD-10-CM | POA: Diagnosis present

## 2021-12-11 DIAGNOSIS — I340 Nonrheumatic mitral (valve) insufficiency: Secondary | ICD-10-CM | POA: Diagnosis present

## 2021-12-11 DIAGNOSIS — I440 Atrioventricular block, first degree: Secondary | ICD-10-CM | POA: Diagnosis present

## 2021-12-11 DIAGNOSIS — Z79899 Other long term (current) drug therapy: Secondary | ICD-10-CM

## 2021-12-11 DIAGNOSIS — E876 Hypokalemia: Secondary | ICD-10-CM | POA: Diagnosis not present

## 2021-12-11 LAB — I-STAT VENOUS BLOOD GAS, ED
Acid-Base Excess: 0 mmol/L (ref 0.0–2.0)
Bicarbonate: 23.1 mmol/L (ref 20.0–28.0)
Calcium, Ion: 0.95 mmol/L — ABNORMAL LOW (ref 1.15–1.40)
HCT: 33 % — ABNORMAL LOW (ref 39.0–52.0)
Hemoglobin: 11.2 g/dL — ABNORMAL LOW (ref 13.0–17.0)
O2 Saturation: 55 %
Patient temperature: 37
Potassium: 3.1 mmol/L — ABNORMAL LOW (ref 3.5–5.1)
Sodium: 144 mmol/L (ref 135–145)
TCO2: 24 mmol/L (ref 22–32)
pCO2, Ven: 32.3 mmHg — ABNORMAL LOW (ref 44.0–60.0)
pH, Ven: 7.462 — ABNORMAL HIGH (ref 7.250–7.430)
pO2, Ven: 27 mmHg — CL (ref 32.0–45.0)

## 2021-12-11 NOTE — ED Provider Notes (Signed)
Emergency Medicine Provider Triage Evaluation Note  Randy [name] , a 80 y.o. male  was evaluated in triage.  Pt complains of severe shortness of breath and chest pain that started today.  Patient was  recently discharged from the hospital after admission for acute respiratory failure with hypoxia and hypercapnia secondary to flash pulmonary edema and possibly contributed to by his COPD.  He endorses compliance with his medications.  With EMS was hypoxic to 88% on room air, improved after DuoNeb on oxygen to 92% on 3 L by nasal cannula.  Was administered 125 Solu-Medrol in route.  Interview complicated by language barrier.  Patient speaks [place] which we do not have access to through our interpreter services; he does speak broken [place] and wanted to conduct interview in [place].  Review of Systems  Positive: SOB, chest pain, fatigue, cough Negative: Fever, abdominal pain, N/V  Physical Exam  BP 139/64 (BP Location: Left Arm)    Pulse 85    Temp 98.4 F (36.9 C) (Oral)    Resp 14    SpO2 (!) 86%  Gen:   Awake, no distress   Resp:  Normal effort  MSK:   Moves extremities without difficulty  Other:  Rales in the lung bases, wheezing in the left lung field. No Lower extremity edema.   Medical Decision Making  Medically screening exam initiated at 11:26 PM.  Appropriate orders placed.  Randy [name] was informed that the remainder of the evaluation will be completed by another provider, this initial triage assessment does not replace that evaluation, and the importance of remaining in the ED until their evaluation is complete.  This chart was dictated using voice recognition software, Dragon. Despite the best efforts of this provider to proofread and correct errors, errors may still occur which can change documentation meaning.    Randy [name] 16/10/96 0454    Randy Fuel, MD 09/81/19 [phone]

## 2021-12-11 NOTE — ED Triage Notes (Signed)
Pt from home c/o sob, hx of asthma. EMS noted wheezing throughout, given duoneb enroute with improvement. 18g AC 125mg  Solumedrol, duoneb EMS 144/76, pulse 88, RA 88%--improved to 92% on oxygen

## 2021-12-11 NOTE — ED Notes (Signed)
After obtaining baseline vitals, PT placed on 4LPM [place] and is currently sating at 91%

## 2021-12-12 ENCOUNTER — Emergency Department (HOSPITAL_COMMUNITY): Payer: Medicare Other

## 2021-12-12 ENCOUNTER — Encounter (HOSPITAL_COMMUNITY): Payer: Self-pay | Admitting: Internal Medicine

## 2021-12-12 DIAGNOSIS — Z20822 Contact with and (suspected) exposure to covid-19: Secondary | ICD-10-CM | POA: Diagnosis present

## 2021-12-12 DIAGNOSIS — E876 Hypokalemia: Secondary | ICD-10-CM | POA: Diagnosis not present

## 2021-12-12 DIAGNOSIS — J9601 Acute respiratory failure with hypoxia: Secondary | ICD-10-CM

## 2021-12-12 DIAGNOSIS — I5043 Acute on chronic combined systolic (congestive) and diastolic (congestive) heart failure: Secondary | ICD-10-CM

## 2021-12-12 DIAGNOSIS — E785 Hyperlipidemia, unspecified: Secondary | ICD-10-CM | POA: Diagnosis present

## 2021-12-12 DIAGNOSIS — R778 Other specified abnormalities of plasma proteins: Secondary | ICD-10-CM | POA: Diagnosis not present

## 2021-12-12 DIAGNOSIS — I5023 Acute on chronic systolic (congestive) heart failure: Secondary | ICD-10-CM | POA: Diagnosis not present

## 2021-12-12 DIAGNOSIS — J449 Chronic obstructive pulmonary disease, unspecified: Secondary | ICD-10-CM | POA: Diagnosis present

## 2021-12-12 DIAGNOSIS — Z7902 Long term (current) use of antithrombotics/antiplatelets: Secondary | ICD-10-CM | POA: Diagnosis not present

## 2021-12-12 DIAGNOSIS — D72829 Elevated white blood cell count, unspecified: Secondary | ICD-10-CM | POA: Diagnosis present

## 2021-12-12 DIAGNOSIS — Z7982 Long term (current) use of aspirin: Secondary | ICD-10-CM | POA: Diagnosis not present

## 2021-12-12 DIAGNOSIS — I440 Atrioventricular block, first degree: Secondary | ICD-10-CM | POA: Diagnosis present

## 2021-12-12 DIAGNOSIS — Z603 Acculturation difficulty: Secondary | ICD-10-CM | POA: Diagnosis present

## 2021-12-12 DIAGNOSIS — I255 Ischemic cardiomyopathy: Secondary | ICD-10-CM | POA: Diagnosis present

## 2021-12-12 DIAGNOSIS — Z79899 Other long term (current) drug therapy: Secondary | ICD-10-CM | POA: Diagnosis not present

## 2021-12-12 DIAGNOSIS — R0602 Shortness of breath: Secondary | ICD-10-CM | POA: Diagnosis present

## 2021-12-12 DIAGNOSIS — M109 Gout, unspecified: Secondary | ICD-10-CM | POA: Diagnosis present

## 2021-12-12 DIAGNOSIS — N184 Chronic kidney disease, stage 4 (severe): Secondary | ICD-10-CM | POA: Diagnosis present

## 2021-12-12 DIAGNOSIS — H9190 Unspecified hearing loss, unspecified ear: Secondary | ICD-10-CM | POA: Diagnosis present

## 2021-12-12 DIAGNOSIS — I248 Other forms of acute ischemic heart disease: Secondary | ICD-10-CM | POA: Diagnosis present

## 2021-12-12 DIAGNOSIS — I2510 Atherosclerotic heart disease of native coronary artery without angina pectoris: Secondary | ICD-10-CM | POA: Diagnosis present

## 2021-12-12 DIAGNOSIS — Z7984 Long term (current) use of oral hypoglycemic drugs: Secondary | ICD-10-CM | POA: Diagnosis not present

## 2021-12-12 DIAGNOSIS — E1122 Type 2 diabetes mellitus with diabetic chronic kidney disease: Secondary | ICD-10-CM | POA: Diagnosis present

## 2021-12-12 DIAGNOSIS — I130 Hypertensive heart and chronic kidney disease with heart failure and stage 1 through stage 4 chronic kidney disease, or unspecified chronic kidney disease: Secondary | ICD-10-CM | POA: Diagnosis present

## 2021-12-12 DIAGNOSIS — I340 Nonrheumatic mitral (valve) insufficiency: Secondary | ICD-10-CM | POA: Diagnosis present

## 2021-12-12 DIAGNOSIS — N179 Acute kidney failure, unspecified: Secondary | ICD-10-CM | POA: Diagnosis present

## 2021-12-12 DIAGNOSIS — I214 Non-ST elevation (NSTEMI) myocardial infarction: Secondary | ICD-10-CM | POA: Diagnosis present

## 2021-12-12 DIAGNOSIS — I509 Heart failure, unspecified: Secondary | ICD-10-CM | POA: Insufficient documentation

## 2021-12-12 LAB — CBC
HCT: 38.6 % — ABNORMAL LOW (ref 39.0–52.0)
HCT: 39.3 % (ref 39.0–52.0)
Hemoglobin: 11.2 g/dL — ABNORMAL LOW (ref 13.0–17.0)
Hemoglobin: 11.4 g/dL — ABNORMAL LOW (ref 13.0–17.0)
MCH: 23 pg — ABNORMAL LOW (ref 26.0–34.0)
MCH: 22.8 pg — ABNORMAL LOW (ref 26.0–34.0)
MCHC: 29 g/dL — ABNORMAL LOW (ref 30.0–36.0)
MCHC: 29 g/dL — ABNORMAL LOW (ref 30.0–36.0)
MCV: 79.1 fL — ABNORMAL LOW (ref 80.0–100.0)
MCV: 78.8 fL — ABNORMAL LOW (ref 80.0–100.0)
Platelets: 752 10*3/uL — ABNORMAL HIGH (ref 150–400)
Platelets: 707 10*3/uL — ABNORMAL HIGH (ref 150–400)
RBC: 4.88 MIL/uL (ref 4.22–5.81)
RBC: 4.99 MIL/uL (ref 4.22–5.81)
RDW: 22.6 % — ABNORMAL HIGH (ref 11.5–15.5)
RDW: 22.8 % — ABNORMAL HIGH (ref 11.5–15.5)
WBC: 16.3 10*3/uL — ABNORMAL HIGH (ref 4.0–10.5)
WBC: 7.3 10*3/uL (ref 4.0–10.5)
nRBC: 0 % (ref 0.0–0.2)
nRBC: 0 % (ref 0.0–0.2)

## 2021-12-12 LAB — GLUCOSE, CAPILLARY
Glucose-Capillary: 170 mg/dL — ABNORMAL HIGH (ref 70–99)
Glucose-Capillary: 179 mg/dL — ABNORMAL HIGH (ref 70–99)

## 2021-12-12 LAB — BASIC METABOLIC PANEL
Anion gap: 9 (ref 5–15)
Anion gap: 13 (ref 5–15)
BUN: 42 mg/dL — ABNORMAL HIGH (ref 8–23)
BUN: 40 mg/dL — ABNORMAL HIGH (ref 8–23)
CO2: 25 mmol/L (ref 22–32)
CO2: 23 mmol/L (ref 22–32)
Calcium: 8.2 mg/dL — ABNORMAL LOW (ref 8.9–10.3)
Calcium: 8.6 mg/dL — ABNORMAL LOW (ref 8.9–10.3)
Chloride: 105 mmol/L (ref 98–111)
Chloride: 102 mmol/L (ref 98–111)
Creatinine, Ser: 1.85 mg/dL — ABNORMAL HIGH (ref 0.61–1.24)
Creatinine, Ser: 2.02 mg/dL — ABNORMAL HIGH (ref 0.61–1.24)
GFR, Estimated: 36 mL/min — ABNORMAL LOW (ref >60)
GFR, Estimated: 32 mL/min — ABNORMAL LOW (ref >60)
Glucose, Bld: 176 mg/dL — ABNORMAL HIGH (ref 70–99)
Glucose, Bld: 255 mg/dL — ABNORMAL HIGH (ref 70–99)
Potassium: 3.5 mmol/L (ref 3.5–5.1)
Potassium: 3.2 mmol/L — ABNORMAL LOW (ref 3.5–5.1)
Sodium: 139 mmol/L (ref 135–145)
Sodium: 138 mmol/L (ref 135–145)

## 2021-12-12 LAB — CBC WITH DIFFERENTIAL/PLATELET
Abs Immature Granulocytes: 0 10*3/uL (ref 0.00–0.07)
Basophils Absolute: 0 10*3/uL (ref 0.0–0.1)
Basophils Relative: 0 %
Eosinophils Absolute: 0 10*3/uL (ref 0.0–0.5)
Eosinophils Relative: 0 %
HCT: 40.3 % (ref 39.0–52.0)
Hemoglobin: 11.5 g/dL — ABNORMAL LOW (ref 13.0–17.0)
Lymphocytes Relative: 6 %
Lymphs Abs: 0.4 10*3/uL — ABNORMAL LOW (ref 0.7–4.0)
MCH: 22.4 pg — ABNORMAL LOW (ref 26.0–34.0)
MCHC: 28.5 g/dL — ABNORMAL LOW (ref 30.0–36.0)
MCV: 78.6 fL — ABNORMAL LOW (ref 80.0–100.0)
Monocytes Absolute: 0.1 10*3/uL (ref 0.1–1.0)
Monocytes Relative: 2 %
Neutro Abs: 6.6 10*3/uL (ref 1.7–7.7)
Neutrophils Relative %: 92 %
Platelets: 782 10*3/uL — ABNORMAL HIGH (ref 150–400)
RBC: 5.13 MIL/uL (ref 4.22–5.81)
RDW: 23.3 % — ABNORMAL HIGH (ref 11.5–15.5)
WBC: 7.2 10*3/uL (ref 4.0–10.5)
nRBC: 0 % (ref 0.0–0.2)
nRBC: 0 /100 WBC

## 2021-12-12 LAB — RESP PANEL BY RT-PCR (FLU A&B, COVID) ARPGX2
Influenza A by PCR: NEGATIVE
Influenza B by PCR: NEGATIVE
SARS Coronavirus 2 by RT PCR: NEGATIVE

## 2021-12-12 LAB — CBG MONITORING, ED: Glucose-Capillary: 190 mg/dL — ABNORMAL HIGH (ref 70–99)

## 2021-12-12 LAB — URINALYSIS, COMPLETE (UACMP) WITH MICROSCOPIC
Bacteria, UA: NONE SEEN
Bilirubin Urine: NEGATIVE
Glucose, UA: NEGATIVE mg/dL
Ketones, ur: NEGATIVE mg/dL
Leukocytes,Ua: NEGATIVE
Nitrite: NEGATIVE
Protein, ur: NEGATIVE mg/dL
Specific Gravity, Urine: 1.006 (ref 1.005–1.030)
pH: 5 (ref 5.0–8.0)

## 2021-12-12 LAB — HEMOGLOBIN A1C
Hgb A1c MFr Bld: 6.7 % — ABNORMAL HIGH (ref 4.8–5.6)
Mean Plasma Glucose: 145.59 mg/dL

## 2021-12-12 LAB — BRAIN NATRIURETIC PEPTIDE: B Natriuretic Peptide: 2635.7 pg/mL — ABNORMAL HIGH (ref 0.0–100.0)

## 2021-12-12 LAB — TROPONIN I (HIGH SENSITIVITY)
Troponin I (High Sensitivity): 114 ng/L (ref <18)
Troponin I (High Sensitivity): 159 ng/L (ref <18)
Troponin I (High Sensitivity): 298 ng/L (ref <18)

## 2021-12-12 LAB — PROCALCITONIN: Procalcitonin: <0.1 ng/mL

## 2021-12-12 MED ORDER — CARVEDILOL 3.125 MG PO TABS
3.1250 mg | ORAL_TABLET | Freq: Two times a day (BID) | ORAL | Status: DC
  Administered 2021-12-12 (×2): 3.125 mg via ORAL
  Filled 2021-12-12 (×2): qty 1

## 2021-12-12 MED ORDER — ACETAMINOPHEN 650 MG RE SUPP: 650.0000 mg | Freq: Four times a day (QID) | RECTAL | Status: DC | PRN

## 2021-12-12 MED ORDER — FERROUS SULFATE 325 (65 FE) MG PO TABS
325.0000 mg | ORAL_TABLET | Freq: Every day | ORAL | Status: DC
  Administered 2021-12-12 – 2021-12-14 (×3): 325 mg via ORAL
  Filled 2021-12-12 (×3): qty 1

## 2021-12-12 MED ORDER — POTASSIUM CHLORIDE 10 MEQ/100ML IV SOLN
10.0000 meq | Freq: Once | INTRAVENOUS | Status: AC
  Administered 2021-12-12: 02:00:00 10 meq via INTRAVENOUS
  Filled 2021-12-12: qty 100

## 2021-12-12 MED ORDER — ISOSORB DINITRATE-HYDRALAZINE 20-37.5 MG PO TABS
1.0000 | ORAL_TABLET | Freq: Three times a day (TID) | ORAL | Status: DC
  Administered 2021-12-12 – 2021-12-14 (×6): 1 via ORAL
  Filled 2021-12-12 (×10): qty 1

## 2021-12-12 MED ORDER — DOCUSATE SODIUM 100 MG PO CAPS
100.0000 mg | ORAL_CAPSULE | Freq: Two times a day (BID) | ORAL | Status: DC
  Administered 2021-12-12 – 2021-12-13 (×4): 100 mg via ORAL
  Filled 2021-12-12 (×5): qty 1

## 2021-12-12 MED ORDER — CLOPIDOGREL BISULFATE 75 MG PO TABS
75.0000 mg | ORAL_TABLET | Freq: Every day | ORAL | Status: DC
  Administered 2021-12-12 – 2021-12-14 (×3): 75 mg via ORAL
  Filled 2021-12-12 (×3): qty 1

## 2021-12-12 MED ORDER — INSULIN ASPART 100 UNIT/ML IJ SOLN: 0.0000 [IU] | Freq: Every day | INTRAMUSCULAR | Status: DC

## 2021-12-12 MED ORDER — PANTOPRAZOLE SODIUM 40 MG PO TBEC
40.0000 mg | DELAYED_RELEASE_TABLET | Freq: Every day | ORAL | Status: DC
  Administered 2021-12-12 – 2021-12-14 (×3): 40 mg via ORAL
  Filled 2021-12-12 (×3): qty 1

## 2021-12-12 MED ORDER — POTASSIUM CHLORIDE CRYS ER 20 MEQ PO TBCR
40.0000 meq | EXTENDED_RELEASE_TABLET | Freq: Once | ORAL | Status: AC
  Administered 2021-12-12: 02:00:00 40 meq via ORAL
  Filled 2021-12-12: qty 2

## 2021-12-12 MED ORDER — INSULIN ASPART 100 UNIT/ML IJ SOLN
0.0000 [IU] | Freq: Three times a day (TID) | INTRAMUSCULAR | Status: DC
  Administered 2021-12-12 – 2021-12-13 (×3): 2 [IU] via SUBCUTANEOUS
  Administered 2021-12-13: 17:00:00 3 [IU] via SUBCUTANEOUS
  Administered 2021-12-14: 06:00:00 2 [IU] via SUBCUTANEOUS

## 2021-12-12 MED ORDER — HEPARIN SODIUM (PORCINE) 5000 UNIT/ML IJ SOLN
5000.0000 [IU] | Freq: Three times a day (TID) | INTRAMUSCULAR | Status: DC
  Administered 2021-12-12 – 2021-12-13 (×3): 5000 [IU] via SUBCUTANEOUS
  Filled 2021-12-12 (×5): qty 1

## 2021-12-12 MED ORDER — ACETAMINOPHEN 325 MG PO TABS: 650.0000 mg | ORAL_TABLET | Freq: Four times a day (QID) | ORAL | Status: DC | PRN

## 2021-12-12 MED ORDER — ATORVASTATIN CALCIUM 80 MG PO TABS
80.0000 mg | ORAL_TABLET | Freq: Every day | ORAL | Status: DC
  Administered 2021-12-12 – 2021-12-14 (×3): 80 mg via ORAL
  Filled 2021-12-12 (×3): qty 1

## 2021-12-12 MED ORDER — ASPIRIN EC 81 MG PO TBEC
81.0000 mg | DELAYED_RELEASE_TABLET | Freq: Every day | ORAL | Status: DC
  Administered 2021-12-12 – 2021-12-14 (×3): 81 mg via ORAL
  Filled 2021-12-12 (×3): qty 1

## 2021-12-12 MED ORDER — FINASTERIDE 5 MG PO TABS
5.0000 mg | ORAL_TABLET | Freq: Every day | ORAL | Status: DC
  Administered 2021-12-12 – 2021-12-14 (×3): 5 mg via ORAL
  Filled 2021-12-12 (×3): qty 1

## 2021-12-12 MED ORDER — FUROSEMIDE 10 MG/ML IJ SOLN
80.0000 mg | Freq: Once | INTRAMUSCULAR | Status: AC
  Administered 2021-12-12: 02:00:00 80 mg via INTRAVENOUS
  Filled 2021-12-12: qty 8

## 2021-12-12 MED ORDER — EMPAGLIFLOZIN 10 MG PO TABS
10.0000 mg | ORAL_TABLET | Freq: Every day | ORAL | Status: DC
  Administered 2021-12-13 – 2021-12-14 (×2): 10 mg via ORAL
  Filled 2021-12-12 (×3): qty 1

## 2021-12-12 MED ORDER — TAMSULOSIN HCL 0.4 MG PO CAPS
0.4000 mg | ORAL_CAPSULE | Freq: Two times a day (BID) | ORAL | Status: DC
  Administered 2021-12-12 – 2021-12-14 (×5): 0.4 mg via ORAL
  Filled 2021-12-12 (×5): qty 1

## 2021-12-12 MED ORDER — FUROSEMIDE 10 MG/ML IJ SOLN
80.0000 mg | Freq: Two times a day (BID) | INTRAMUSCULAR | Status: DC
  Administered 2021-12-12 (×2): 80 mg via INTRAVENOUS
  Filled 2021-12-12 (×2): qty 8

## 2021-12-12 NOTE — H&P (Signed)
History and Physical    Randy [name] QPY:[id number] DOB: [birth year] DOA: 12/11/2021  PCP: Jilda Panda, MD Patient coming from: Home  Chief Complaint: Shortness of breath, chest pain  HPI: Randy [name] is a 80 y.o. male with medical history significant of CAD, chronic combined CHF, CKD stage IIIb-IV, hypertension.  Recently admitted 11/21/2021-11/28/2021 for decompensated CHF and NSTEMI for which cardiology recommended medical management.  Also treated for CAP during this hospitalization.  In addition, patient had melanotic stools and was seen by GI and underwent EGD which was unremarkable with no plans for colonoscopy due to poor underlying health status.  He was placed on twice daily PPI and iron supplement.  Patient presented to the ED via EMS for evaluation of shortness of breath and chest pain.  With EMS, patient was hypoxic to 88% on room air, improved after DuoNeb, Solu-Medrol, and 3 L supplemental oxygen.  In the ED, not febrile or tachycardic.  Satting well on 4 L supplemental oxygen.  Labs showing WBC 16.3.  Hemoglobin 11.2, improved compared to recent labs.  Platelet count 752K, elevated on recent labs as well and stable.  Creatinine 1.8, stable.  EKG showing T wave abnormality in inferior and lateral leads similar to prior tracing.  Troponin 114 >159.  VBG with pH 7.46, PCO2 32.  BNP 2635.  COVID and influenza PCR negative.  Chest x-ray showing increasing vascular congestion and edema compared to prior exam. ED physician discussed the case with on-call cardiologist who felt that the patient was a poor candidate for catheterization and/or CABG and thought that fluid overload could be causing his symptoms.  Cardiologist also felt that his troponin has been downtrending from his last admission.  Patient was given IV Lasix 80 mg and potassium supplement.  Unable to obtain history from the patient due to language barrier.  He speaks [place] for which no interpreter available.  No family  available at this time either.    Review of Systems:  All systems reviewed and apart from history of presenting illness, are negative.  Past Medical History:  Diagnosis Date   Acute on chronic HFrEF (heart failure with reduced ejection fraction) ([facility]) 01/15/5808   Acute systolic CHF (congestive heart failure) ([facility]) 11/16/2021   CAD, 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   Hearing loss    HTN (hypertension)    Hypoxia 07/01/2021    Past Surgical History:  Procedure Laterality Date   ESOPHAGOGASTRODUODENOSCOPY (EGD) WITH PROPOFOL N/A 11/26/2021   Procedure: ESOPHAGOGASTRODUODENOSCOPY (EGD) WITH PROPOFOL;  Surgeon: Doran Stabler, MD;  Location: [facility];  Service: Gastroenterology;  Laterality: N/A;   EYE SURGERY     IABP INSERTION N/A 01/30/2021   Procedure: IABP Insertion;  Surgeon: Leonie Man, MD;  Location: [facility];  Service: Cardiovascular;  Laterality: N/A;   RIGHT/LEFT HEART CATH AND CORONARY ANGIOGRAPHY N/A 01/30/2021   Procedure: RIGHT/LEFT HEART CATH AND CORONARY ANGIOGRAPHY;  Surgeon: Leonie Man, MD;  Location: [facility];  Service: Cardiovascular;  Laterality: N/A;     reports that he has never smoked. He has never used smokeless tobacco. He reports that he does not drink alcohol and does not use drugs.  No Known Allergies  History reviewed. No pertinent family history.  Prior to Admission medications   Medication Sig Start Date End Date Taking? Authorizing Provider  Acetaminophen (TYLENOL PO) Take 3 tablets by mouth 2 (two) times daily as needed (pain/headache).    [provider]  acetaminophen (TYLENOL) 325 MG tablet Take  2 tablets (650 mg total) by mouth every 4 (four) hours as needed for headache or mild pain. 07/04/21   Isaiah Serge, NP  aspirin 81 MG chewable tablet Chew 1 tablet (81 mg total) by mouth daily. 02/06/21   Hosie Poisson, MD  aspirin 81 MG EC tablet Take 1 tablet (81 mg total) by mouth daily. Swallow whole. 11/28/21    Thurnell Lose, MD  atorvastatin (LIPITOR) 80 MG tablet Take 1 tablet (80 mg total) by mouth daily. 02/06/21   Hosie Poisson, MD  atorvastatin (LIPITOR) 80 MG tablet Take 1 tablet (80 mg total) by mouth daily. 11/28/21   Thurnell Lose, MD  carvedilol (COREG) 3.125 MG tablet Take 1 tablet (3.125 mg total) by mouth 2 (two) times daily with a meal. 11/20/21   Lavina Hamman, MD  carvedilol (COREG) 3.125 MG tablet Take 1 tablet (3.125 mg total) by mouth 2 (two) times daily. 11/28/21   Thurnell Lose, MD  clopidogrel (PLAVIX) 75 MG tablet Take 1 tablet (75 mg total) by mouth daily. 02/06/21   Hosie Poisson, MD  clopidogrel (PLAVIX) 75 MG tablet Take 1 tablet (75 mg total) by mouth daily. 11/28/21   Thurnell Lose, MD  docusate sodium (COLACE) 100 MG capsule Take 1 capsule (100 mg total) by mouth 2 (two) times daily. 11/20/21   Lavina Hamman, MD  docusate sodium (COLACE) 100 MG capsule Take 1 capsule (100 mg total) by mouth 2 (two) times daily. 11/28/21 12/28/21  Thurnell Lose, MD  ferrous sulfate 325 (65 FE) MG tablet Take 1 tablet (325 mg total) by mouth daily with breakfast. 11/21/21   Lavina Hamman, MD  ferrous sulfate 325 (65 FE) MG tablet Take 1 tablet (325 mg total) by mouth 2 (two) times daily with a meal. 11/28/21   Thurnell Lose, MD  finasteride (PROSCAR) 5 MG tablet Take 1 tablet (5 mg total) by mouth daily. 11/21/21   Lavina Hamman, MD  furosemide (LASIX) 80 MG tablet Take 1 tablet (80 mg total) by mouth daily. 11/28/21   Thurnell Lose, MD  isosorbide-hydrALAZINE (BIDIL) 20-37.5 MG tablet Take 1 tablet by mouth 3 (three) times daily. 07/04/21   Isaiah Serge, NP  isosorbide-hydrALAZINE (BIDIL) 20-37.5 MG tablet Take 1 tablet by mouth 3 (three) times daily. 11/28/21   Thurnell Lose, MD  JARDIANCE 10 MG TABS tablet Take 10 mg by mouth daily. 11/10/21   [provider]  metFORMIN (GLUCOPHAGE) 500 MG tablet Take 500 mg by mouth in the morning and at bedtime. 06/10/21    [provider]  nitroGLYCERIN (NITROSTAT) 0.4 MG SL tablet Place 1 tablet (0.4 mg total) under the tongue every 5 (five) minutes x 3 doses as needed for chest pain. 02/05/21   Hosie Poisson, MD  pantoprazole (PROTONIX) 40 MG tablet Take 1 tablet (40 mg total) by mouth daily at 6 (six) AM. 02/06/21   Hosie Poisson, MD  pantoprazole (PROTONIX) 40 MG tablet Take 1 tablet (40 mg total) by mouth 2 (two) times daily before a meal. 11/28/21   Thurnell Lose, MD  tamsulosin (FLOMAX) 0.4 MG CAPS capsule Take 1 capsule (0.4 mg total) by mouth 2 (two) times daily. 07/05/21   Hilty, Nadean Corwin, MD  tamsulosin (FLOMAX) 0.4 MG CAPS capsule Take 2 capsules (0.8 mg total) by mouth daily. 11/28/21   Thurnell Lose, MD  vitamin B-12 (CYANOCOBALAMIN) 500 MCG tablet Take 1 tablet (500 mcg total) by mouth daily.  11/21/21   Lavina Hamman, MD    Physical Exam: Vitals:   12/12/21 0245 12/12/21 0500 12/12/21 0530 12/12/21 0730  BP: 128/60 134/71 140/63 (!) 127/56  Pulse: 78 77 76 60  Resp: (!) 24 (!) 24 (!) 24 17  Temp:      TempSrc:      SpO2: 98% 98% 92% 94%    Physical Exam Constitutional:      General: He is not in acute distress. HENT:     Head: Normocephalic and atraumatic.  Eyes:     Extraocular Movements: Extraocular movements intact.     Conjunctiva/sclera: Conjunctivae normal.  Cardiovascular:     Rate and Rhythm: Normal rate and regular rhythm.     Pulses: Normal pulses.  Pulmonary:     Effort: Pulmonary effort is normal. No respiratory distress.     Breath sounds: Rales present. No wheezing.     Comments: Bibasilar rales Abdominal:     General: Bowel sounds are normal. There is no distension.     Palpations: Abdomen is soft.     Tenderness: There is no abdominal tenderness.  Musculoskeletal:        General: No swelling or tenderness.     Cervical back: Normal range of motion and neck supple.  Skin:    General: Skin is warm and dry.  Neurological:     General: No focal  deficit present.     Mental Status: He is alert and oriented to person, place, and time.     Labs on Admission: I have personally reviewed following labs and imaging studies  CBC: Recent Labs  Lab 12/11/21 2313 12/11/21 2343  WBC 16.3*  --   HGB 11.2* 11.2*  HCT 38.6* 33.0*  MCV 79.1*  --   PLT 752*  --    Basic Metabolic Panel: Recent Labs  Lab 12/11/21 2313 12/11/21 2343  NA 139 144  K 3.5 3.1*  CL 105  --   CO2 25  --   GLUCOSE 176*  --   BUN 42*  --   CREATININE 1.85*  --   CALCIUM 8.2*  --    GFR: CrCl cannot be calculated (Unknown ideal weight.). Liver Function Tests: No results for input(s): AST, ALT, ALKPHOS, BILITOT, PROT, ALBUMIN in the last 168 hours. No results for input(s): LIPASE, AMYLASE in the last 168 hours. No results for input(s): AMMONIA in the last 168 hours. Coagulation Profile: No results for input(s): INR, PROTIME in the last 168 hours. Cardiac Enzymes: No results for input(s): CKTOTAL, CKMB, CKMBINDEX, TROPONINI in the last 168 hours. BNP (last 3 results) No results for input(s): PROBNP in the last 8760 hours. HbA1C: No results for input(s): HGBA1C in the last 72 hours. CBG: No results for input(s): GLUCAP in the last 168 hours. Lipid Profile: No results for input(s): CHOL, HDL, LDLCALC, TRIG, CHOLHDL, LDLDIRECT in the last 72 hours. Thyroid Function Tests: No results for input(s): TSH, T4TOTAL, FREET4, T3FREE, THYROIDAB in the last 72 hours. Anemia Panel: No results for input(s): VITAMINB12, FOLATE, FERRITIN, TIBC, IRON, RETICCTPCT in the last 72 hours. Urine analysis:    Component Value Date/Time   COLORURINE YELLOW 11/22/2021 0820   APPEARANCEUR CLEAR 11/22/2021 0820   LABSPEC 1.025 11/22/2021 0820   PHURINE 5.5 11/22/2021 0820   GLUCOSEU 250 (A) 11/22/2021 0820   HGBUR NEGATIVE 11/22/2021 0820   BILIRUBINUR NEGATIVE 11/22/2021 0820   KETONESUR NEGATIVE 11/22/2021 0820   PROTEINUR 30 (A) 11/22/2021 0820   UROBILINOGEN 0.2  08/06/2011 0313   NITRITE NEGATIVE 11/22/2021 0820   LEUKOCYTESUR NEGATIVE 11/22/2021 0820    Radiological Exams on Admission: DG Chest 2 View  Result Date: 12/12/2021 CLINICAL DATA:  Shortness of breath and chest pain EXAM: CHEST - 2 VIEW COMPARISON:  11/27/2021 FINDINGS: Cardiac shadow is prominent but stable. Aortic calcifications are seen. Increased vascular congestion and edema is noted when compare with the prior exam. No effusion is seen. No bony abnormality is noted. IMPRESSION: Vascular congestion with increasing edema. Electronically Signed   By: Inez Catalina M.D.   On: 12/12/2021 00:18    EKG: Independently reviewed.  Sinus rhythm with first-degree AV block.  T wave abnormality in inferior and lateral leads seen on prior tracing as well.  Assessment/Plan Principal Problem:   CHF exacerbation (HCC) Active Problems:   Acute respiratory failure with hypoxia (HCC)   AKI-CKD 3B   CAD, multiple vessel   Elevated troponin   Acute hypoxic respiratory failure secondary to acute on chronic combined CHF Hypoxic to upper 80s on room air with EMS and initially placed on supplemental oxygen.  Currently satting well on room air.  Bibasilar rales appreciated upon auscultation of the lungs, no wheezing.  BNP significantly elevated at 2635. Chest x-ray showing increasing vascular congestion and edema compared to prior exam.  Unable to obtain history from the patient at this time due to language barrier, unclear whether he is taking his diuretic and other home medications. Echo done 11/22/2021 showing LVEF 30 to 35% with global hypokinesis, grade 3 diastolic dysfunction, and moderate mitral regurgitation. -Continue diuresis with IV Lasix 80 mg twice daily.  Monitor intake and output, daily weights.  Low-sodium diet with fluid restriction.  Continuous pulse ox, supplemental oxygen as needed to keep oxygen saturation above 92%.  I have sent a message to cardiology requesting consultation in  a.m.  Chest pain and elevated troponin Severe multivessel CAD LHC done in February 2022 showing severe multivessel CAD.  Patient was recently admitted for NSTEMI and cardiology had recommended medical management.  High-sensitivity troponin elevated today (114 > 159), however, improved compared to recent hospitalization. EKG showing T wave abnormality in inferior and lateral leads similar to prior tracing.  Per ED notes, patient complained of chest pain but I am not able to obtain any history from him at this time due to language barrier.  He appears comfortable.  ED physician discussed the case with on-call cardiologist who felt that the patient is a poor candidate for repeat catheterization and/or CABG. -Trend troponin.  I have sent a message to daytime cardiology team requesting consultation.  Leukocytosis Possibly reactive.  Not febrile. -Check UA and procalcitonin level, repeat CBC  CKD stage IIIb-IV Creatinine 1.8, stable. -Continue to monitor renal function  Hypertension Stable.  Pharmacy med rec pending.  DVT prophylaxis: Subcutaneous heparin Code Status: Full code by default.  Not able to discuss CODE STATUS at this time due to language barrier.  Please readdress in a.m. Family Communication: No family available at this time. Disposition Plan: Status is: Inpatient  Remains inpatient appropriate because: Acute hypoxic respiratory failure, decompensated CHF  Level of care: Level of care: Telemetry Cardiac  The medical decision making on this patient was of high complexity and the patient is at high risk for clinical deterioration, therefore this is a level 3 visit.  Shela Leff MD [facility]  If 7PM-7AM, please contact night-coverage [email or web address]  12/12/2021, 8:04 AM

## 2021-12-12 NOTE — ED Notes (Signed)
RN helped pt use phone to call daughter.

## 2021-12-12 NOTE — Progress Notes (Signed)
Visited patient again and was able to contact a Teacher, adult education. Translator ID [phone]  Patient reports that he came to the ED because he has trouble breathing. SOB occurs at night after he eats dinner and when he is laying down in bed. Feels like there is something stuck in his chest. SOB does not happen at all during the day, and patient states that he is able to do all of his daily activities without trouble. Reports occasional chest pain that happens when he is at rest. When this happens, he goes outside to exercise and that relieves the pain. Patient reports that he takes his medications as they are prescribed.   Margie Billet, PA-C 12/12/2021 3:24 PM

## 2021-12-12 NOTE — ED Notes (Signed)
Pt sitting at bedside eating lunch  

## 2021-12-12 NOTE — Progress Notes (Signed)
This is a pleasant 80 year old gentleman who speaks [place], with a past medical history of combined CHF, CKD stage IIIb, hypertension who presented to ED with complaint of chest pain and shortness of breath and was also hypoxic and was admitted with acute hypoxic respiratory failure secondary to acute on chronic combined congestive heart failure.  Recent echo showed LVEF of 30 to 35% with global hypokinesis.  Grade 3 diastolic dysfunction.  His last LHC was done in February 2022 which showed severe multivessel disease and cardiology recommended medical management.  He was recently admitted from 11/21/2021 through 11/28/2021 for decompensated CHF and NSTEMI.  His troponins are elevated but much improved from his recent hospitalization.  Unfortunately, we do not have [place] interpreter even remotely.  Patient is able to communicate only a little bit.  He was seen and examined in the ED.  He is very comfortable.  He is not on oxygen anymore and he was saturating 95% on room air.  He tells me that he did not have any chest pain and his breathing is better than yesterday.  He did not have any bilateral lower extremity edema but he did have faint crackles at the bases bilaterally.  I will continue current dose of Lasix for another day.  Reassess tomorrow and potentially discharge tomorrow.

## 2021-12-12 NOTE — ED Notes (Signed)
Provided pt with socks

## 2021-12-12 NOTE — Consult Note (Addendum)
Cardiology Consultation:   Patient ID: Jahvon Gosline MRN: [id number]; DOB: 1941/06/23  Admit date: 12/11/2021 Date of Consult: 12/12/2021  PCP:  Jilda Panda, MD   [facility] Providers Cardiologist:  Donato Heinz, MD    Patient Profile:   Aleksei Goodlin is a 80 y.o. 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 Marlowe Sax.  History of Present Illness:   Mr. Brazie presented to the ED on 12/21 complaining of severe SOB and chest pain that started earlier that day. Patient speaks [place], for which there is no interpreter available which made interview difficult. In the ED, Na 144, K 3.1. Creatinine initially 1.85>> 2.02. eGFR 36>>32. Hemoglobin 11.2, WBC 16.3. VBG pH 7.46, pO2 27, pCO2 32.3. BNP elevated to 2635.7. HSTN 114>>159>>298. Viral respiratory panel negative.  EKG showed sinus rhythm with a 1st degree A-V block, right axis deviation, possible anterior infarct, ST&T wave abnormalities in the inferolateral leads. CXR showed vascular congestion with increasing edema   Patient most recently was hospitalized from 11/21/2021-11/29/2021 for treatment of ischemic cardiomyopathy, NSTEMI. Patient presented with HSTN 121>>449>>1527. EKG with new deep TWI in lead V3-V4. Patient was not a candidate for PCI and NSTEMI was medically managed with IV heparin, nitroglycerin drip, aspirin, plavix, carvedilol, lipitor. Echo on 11/22/2021 showed LVEF 30-35% (improved from 25% on 11/16/2021), moderately decreased LV function, grade III diastolic dysfunction. Patient had acute blood loss anemia and had an EGD that did not show evidence of GI bleeding.   On exam, language barrier made  interview difficult. Patient reports that his SOB is worse at night, not associated with position. Reports occasional chest pain that comes and goes and is worse with breathing. Attempted to contact daughter, but she did not answer.   Past Medical History:  Diagnosis Date   Acute on chronic HFrEF (heart failure with reduced ejection fraction) ([place]) 9/37/9024   Acute systolic CHF (congestive heart failure) ([place]) 11/16/2021   CAD, 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   Hearing loss    HTN (hypertension)    Hypoxia 07/01/2021    Past Surgical History:  Procedure Laterality Date   ESOPHAGOGASTRODUODENOSCOPY (EGD) WITH PROPOFOL N/A 11/26/2021   Procedure: ESOPHAGOGASTRODUODENOSCOPY (EGD) WITH PROPOFOL;  Surgeon: Doran Stabler, MD;  Location: [place];  Service: Gastroenterology;  Laterality: N/A;   EYE SURGERY     IABP INSERTION N/A 01/30/2021   Procedure: IABP Insertion;  Surgeon: Leonie Man, MD;  Location: [place] [facility];  Service: Cardiovascular;  Laterality: N/A;   RIGHT/LEFT HEART CATH AND CORONARY ANGIOGRAPHY N/A 01/30/2021   Procedure: RIGHT/LEFT HEART CATH AND CORONARY ANGIOGRAPHY;  Surgeon: Leonie Man, MD;  Location: [place] [facility];  Service: Cardiovascular;  Laterality: N/A;     Home Medications:  Prior to Admission medications   Medication Sig Start Date End Date Taking? Authorizing Provider  Acetaminophen (TYLENOL PO) Take 3 tablets by mouth 2 (two) times daily as needed (pain/headache).    [provider]  acetaminophen (TYLENOL) 325 MG tablet Take 2 tablets (650 mg total) by mouth every 4 (four) hours as needed for headache or mild pain. 07/04/21   Isaiah Serge, NP  aspirin 81 MG chewable tablet Chew  1 tablet (81 mg total) by mouth daily. 02/06/21   Hosie Poisson, MD  aspirin 81 MG EC tablet Take 1 tablet (81 mg total) by mouth daily. Swallow whole. 11/28/21   Thurnell Lose, MD  atorvastatin (LIPITOR) 80 MG tablet Take 1 tablet (80 mg total) by mouth  daily. 02/06/21   Hosie Poisson, MD  atorvastatin (LIPITOR) 80 MG tablet Take 1 tablet (80 mg total) by mouth daily. 11/28/21   Thurnell Lose, MD  carvedilol (COREG) 3.125 MG tablet Take 1 tablet (3.125 mg total) by mouth 2 (two) times daily with a meal. 11/20/21   Lavina Hamman, MD  carvedilol (COREG) 3.125 MG tablet Take 1 tablet (3.125 mg total) by mouth 2 (two) times daily. 11/28/21   Thurnell Lose, MD  clopidogrel (PLAVIX) 75 MG tablet Take 1 tablet (75 mg total) by mouth daily. 02/06/21   Hosie Poisson, MD  clopidogrel (PLAVIX) 75 MG tablet Take 1 tablet (75 mg total) by mouth daily. 11/28/21   Thurnell Lose, MD  docusate sodium (COLACE) 100 MG capsule Take 1 capsule (100 mg total) by mouth 2 (two) times daily. 11/20/21   Lavina Hamman, MD  docusate sodium (COLACE) 100 MG capsule Take 1 capsule (100 mg total) by mouth 2 (two) times daily. 11/28/21 12/28/21  Thurnell Lose, MD  ferrous sulfate 325 (65 FE) MG tablet Take 1 tablet (325 mg total) by mouth daily with breakfast. 11/21/21   Lavina Hamman, MD  ferrous sulfate 325 (65 FE) MG tablet Take 1 tablet (325 mg total) by mouth 2 (two) times daily with a meal. 11/28/21   Thurnell Lose, MD  finasteride (PROSCAR) 5 MG tablet Take 1 tablet (5 mg total) by mouth daily. 11/21/21   Lavina Hamman, MD  furosemide (LASIX) 80 MG tablet Take 1 tablet (80 mg total) by mouth daily. 11/28/21   Thurnell Lose, MD  isosorbide-hydrALAZINE (BIDIL) 20-37.5 MG tablet Take 1 tablet by mouth 3 (three) times daily. 07/04/21   Isaiah Serge, NP  isosorbide-hydrALAZINE (BIDIL) 20-37.5 MG tablet Take 1 tablet by mouth 3 (three) times daily. 11/28/21   Thurnell Lose, MD  JARDIANCE 10 MG TABS tablet Take 10 mg by mouth daily. 11/10/21   [provider]  metFORMIN (GLUCOPHAGE) 500 MG tablet Take 500 mg by mouth in the morning and at bedtime. 06/10/21   [provider]  nitroGLYCERIN (NITROSTAT) 0.4 MG SL tablet Place 1 tablet (0.4 mg  total) under the tongue every 5 (five) minutes x 3 doses as needed for chest pain. 02/05/21   Hosie Poisson, MD  pantoprazole (PROTONIX) 40 MG tablet Take 1 tablet (40 mg total) by mouth daily at 6 (six) AM. 02/06/21   Hosie Poisson, MD  pantoprazole (PROTONIX) 40 MG tablet Take 1 tablet (40 mg total) by mouth 2 (two) times daily before a meal. 11/28/21   Thurnell Lose, MD  tamsulosin (FLOMAX) 0.4 MG CAPS capsule Take 1 capsule (0.4 mg total) by mouth 2 (two) times daily. 07/05/21   Hilty, Nadean Corwin, MD  tamsulosin (FLOMAX) 0.4 MG CAPS capsule Take 2 capsules (0.8 mg total) by mouth daily. 11/28/21   Thurnell Lose, MD  vitamin B-12 (CYANOCOBALAMIN) 500 MCG tablet Take 1 tablet (500 mcg total) by mouth daily. 11/21/21   Lavina Hamman, MD    Inpatient Medications: Scheduled Meds:  aspirin EC  81 mg Oral Daily   atorvastatin  80 mg Oral Daily  carvedilol  3.125 mg Oral BID WC   clopidogrel  75 mg Oral Daily   docusate sodium  100 mg Oral BID   empagliflozin  10 mg Oral Daily   ferrous sulfate  325 mg Oral Q breakfast   finasteride  5 mg Oral Daily   furosemide  80 mg Intravenous BID   heparin  5,000 Units Subcutaneous Q8H   insulin aspart  0-5 Units Subcutaneous QHS   insulin aspart  0-9 Units Subcutaneous TID WC   isosorbide-hydrALAZINE  1 tablet Oral TID   pantoprazole  40 mg Oral Q0600   tamsulosin  0.4 mg Oral BID   Continuous Infusions:  PRN Meds: acetaminophen **OR** acetaminophen  Allergies:   No Known Allergies  Social History:   Social History   Socioeconomic History   Marital status: Married    Spouse name: Not on file   Number of children: Not on file   Years of education: Not on file   Highest education level: Not on file  Occupational History   Not on file  Tobacco Use   Smoking status: Never   Smokeless tobacco: Never  Substance and Sexual Activity   Alcohol use: Never   Drug use: Never   Sexual activity: Not on file  Other Topics Concern   Not on  file  Social History Narrative   ** Merged History Encounter **       ** Merged History Encounter **       Social Determinants of Health   Financial Resource Strain: Not on file  Food Insecurity: Not on file  Transportation Needs: Not on file  Physical Activity: Not on file  Stress: Not on file  Social Connections: Not on file  Intimate Partner Violence: Not on file    Family History:   History reviewed. No pertinent family history.   ROS:  Please see the history of present illness.   All other ROS reviewed and negative.     Physical Exam/Data:   Vitals:   12/12/21 1045 12/12/21 1100 12/12/21 1115 12/12/21 1130  BP:  (!) 143/69  129/61  Pulse: 72  68 62  Resp: 19 15 19 17   Temp:      TempSrc:      SpO2: 92%  90% 94%    Intake/Output Summary (Last 24 hours) at 12/12/2021 1201 Last data filed at 12/12/2021 0713 Gross per 24 hour  Intake --  Output 900 ml  Net -900 ml   Last 3 Weights 11/28/2021 11/27/2021 11/26/2021  Weight (lbs) 123 lb 0.3 oz 126 lb 5.2 oz 131 lb 6.3 oz  Weight (kg) 55.8 kg 57.3 kg 59.6 kg     There is no height or weight on file to calculate BMI.  General:  Well nourished, well developed, in no acute distress HEENT: normal Vascular: Radial pulses 2+ bilaterally Cardiac:  normal S1, S2; RRR; no murmur  Lungs:  Bibasilar rales, no wheezing, rhonchi or rales  Abd: soft, nontender Ext: no edema Musculoskeletal:  No deformities, BUE and BLE strength normal and equal Skin: warm and dry  Neuro:  CNs 2-12 intact, no focal abnormalities noted Psych:  Normal affect   EKG:  The EKG was personally reviewed and demonstrates:  1st degree A-V block, ST&T wave abnormality in the inferolateral leads, possible anterior infarct, age undetermined. When compared to EKG from 11/15/21, ST&T wave abnormalities are similar but more pronounced. Anterior leads appear similar  Telemetry:  Telemetry was personally reviewed and demonstrates:  Sinus rhythm  with rates from  58-100 BPM, occasional PVCs   Relevant CV Studies:  Echo 11/22/2021 1. Left ventricular ejection fraction, by estimation, is 30 to 35%. The  left ventricle has moderately decreased function. The left ventricle  demonstrates global hypokinesis. There is moderate left ventricular  hypertrophy. Left ventricular diastolic  parameters are consistent with Grade III diastolic dysfunction  (restrictive). Elevated left atrial pressure.   2. Right ventricular systolic function is normal. The right ventricular  size is normal. There is moderately elevated pulmonary artery systolic  pressure. The estimated right ventricular systolic pressure is 35.7 mmHg.   3. Left atrial size was severely dilated.   4. The mitral valve is normal in structure. Moderate mitral valve  regurgitation. Appears functional.   5. The aortic valve is tricuspid. Aortic valve regurgitation is mild.  Aortic valve sclerosis/calcification is present, without any evidence of  aortic stenosis.   6. The inferior vena cava is dilated in size with <50% respiratory  variability, suggesting right atrial pressure of 15 mmHg.   Cardiac Cath 0/12/7791 LV end diastolic pressure is moderately elevated. There is no aortic valve stenosis. Hemodynamic findings consistent with mild pulmonary hypertension. With mildly elevated LVEDP. Normal cardiac output and index by Fick ------------------------- Prox RCA to Mid RCA lesion is 50% stenosed. Dist RCA lesion is 99% (subtotal occlusion) stenosed with 90% stenosed side branch in RPAV. Distal RPL fills via left-to-right collaterals RPDA lesion is 100% stenosed. PDA fills via septal collaterals Ostial LM 50% stenosis. Dist LM to Ost LAD lesion is 60% stenosed with 80% stenosed side branch in Ost Cx. Mid LAD lesion is 90% stenosed with 40% stenosed side branch in 2nd Diag.   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  CVTS consulted. Per request, we will check a noncontrasted chest CT Dr. Gardiner Rhyme has agreed to discussed the case with the patient's daughter who is a cardiac nurse.  With the language barrier this is very difficult discussion having he is extremely scared.  At this point I think his only real option is bypass surgery. IV heparin was started per pharmacy protocol for IABP.   He was hemodynamically stable, chest pain-free and breathing well leave the Cath Lab.  Diagnostic Dominance: Right   Laboratory Data:  High Sensitivity Troponin:   Recent Labs  Lab 11/21/21 2215 11/22/21 0600 12/11/21 2313 12/12/21 0113 12/12/21 1016  TROPONINIHS 449* 1,527* 114* 159* 298*     Chemistry Recent Labs  Lab 12/11/21 2313 12/11/21 2343 12/12/21 1016  NA 139 144 138  K 3.5 3.1* 3.2*  CL 105  --  102  CO2 25  --  23  GLUCOSE 176*  --  255*  BUN 42*  --  40*  CREATININE 1.85*  --  2.02*  CALCIUM 8.2*  --  8.6*  GFRNONAA 36*  --  32*  ANIONGAP 9  --  13    No results for input(s): PROT, ALBUMIN, AST, ALT, ALKPHOS, BILITOT in the last 168 hours. Lipids  No results for input(s): CHOL, TRIG, HDL, LABVLDL, LDLCALC, CHOLHDL in the last 168 hours.  Hematology Recent Labs  Lab 12/11/21 2313 12/11/21 2343 12/12/21 1016  WBC 16.3*  --  7.3  RBC 4.88  --  4.99  HGB 11.2* 11.2* 11.4*  HCT 38.6* 33.0* 39.3  MCV 79.1*  --  78.8*  MCH 23.0*  --  22.8*  MCHC 29.0*  --  29.0*  RDW 22.6*  --  22.8*  PLT 752*  --  707*   Thyroid No results for input(s): TSH, FREET4 in the last 168 hours.   BNP Recent Labs  Lab 12/11/21 2359  BNP 2,635.7*    DDimer No results for input(s): DDIMER in the last 168 hours.   Radiology/Studies:  DG Chest 2 View  Result Date: 12/12/2021 CLINICAL DATA:  Shortness of breath and chest pain EXAM: CHEST - 2 VIEW COMPARISON:  11/27/2021 FINDINGS: Cardiac shadow is prominent but stable. Aortic calcifications are seen. Increased vascular congestion and edema is noted when compare with the prior exam. No effusion is seen. No bony abnormality is noted. IMPRESSION: Vascular congestion with increasing edema. Electronically Signed   By: Inez Catalina M.D.   On: 12/12/2021 00:18     Assessment and Plan:   Aquilla Timberlake is a 80 y.o. 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 Marlowe Sax.  Acute hypoxic respiratory failure secondary to acute on chronic combined CHF: BNP elevated to 2635.7. CXR showed vascular congestion with increasing edema. K 3.1>>3.2.  - Echo on 11/22/2021 showed LVEF 30-35% (improved from 25% on 11/16/2021), moderately decreased LV function, grade III diastolic dysfunction - Continue lasix 80 mg IV BID  - Continue to replenish K until>4  - Hold Jardiance, continue to monitor kidney function and restart when improved  - Patient is no longer on oxygen and his O2 sat is 94%   NSTEMI:  HSTN 114>>159>>298. EKG showed inverted T-waves in the inferior leads, biphasic T-waves in anterior and lateral leads. Similar by more pronounced than EKG from Dec 10, 2021. Creatinine 1.85>>2.02 - Patient had a cardiac cath on 01/30/2021 with severe multivessel disease. Patient was not a candidate for PCI and bypass surgery was recommended. However, patient was not a  candidate for CABG due to calcicifation in the aortic root, ascending aorta - Recommend continued medical management. Continue ASA 55m, Plavix 71m Lipitor 8065mCoreg 3.125, isosorbide-hydralazine 20-37.5mg23m HTN - Continue Coreg 3.125 mg   HLD : LDL 60, HDL 60 on 11/23/2021 - Continue lipitor 80mg79mM: A1c 6.7 on 12/12/21 - Patient on SSI   Risk Assessment/Risk Scores:    New York Heart Association (NYHA) Functional Class NYHA Class II     For questions or updates, please contact [facility] Please consult [email or web address] for contact info under   Signed, KathlMargie BilletC  12/12/2021 12:01 PM As above, patient seen and examined.  Briefly he is an 80 ye31 old male with past medical history of coronary artery disease (turndown for coronary artery bypass and graft in February 2022), hypertension, hyperlipidemia, COPD, chronic stage III-IV kidney disease, ischemic cardiomyopathy for evaluation of chest pain and dyspnea.  Last echocardiogram February 2022 showed ejection  fraction 30 to 35%, restrictive filling, moderate pulmonary hypertension, severe left atrial enlargement, moderate mitral regurgitation, mild aortic insufficiency.  Patient states that he has had increased dyspnea on exertion, orthopnea and PND for approximately 1 week.  No pedal edema.  He states he has had chest pain but only after eating.  He denies exertional chest pain.  He was admitted and cardiology asked to evaluate.  Note he has had some improvement with IV diuresis. BUN 40 and creatinine 2.02 Troponin I 114, 159 and 298, hemoglobin 11.5, BNP 2635. Chest x-ray with vascular congestion/edema. Electrocardiogram shows sinus rhythm, right axis deviation, inferior infarct, inferior lateral T wave inversion.  1 acute on chronic systolic congestive heart failure-patient presented with symptoms of CHF.  He received Lasix in the emergency room with some improvement.  We will continue Lasix 80 mg IV twice daily  and likely transition to oral Lasix tomorrow.  Follow renal function closely.  Continue Jardiance.  2 ischemic cardiomyopathy-plan to continue medical therapy.  Continue BiDil.  We will not add ARB or Entresto given baseline renal insufficiency.  Increase carvedilol to 6.25 mg twice daily.  Advance medications as an outpatient.  3 coronary artery disease-continue aspirin and statin.  4 chest pain-patient states that his chest pain only occurs after eating.  He underwent cardiac catheterization in February 2022 and was not felt to be a surgical candidate.  Would continue medical therapy.  5 chronic stage IV kidney disease-follow renal function with diuresis.  6 hyperlipidemia-continue statin.  7 hypertension-blood pressure is controlled.  We will continue present medications in house and follow.  Possible discharge tomorrow morning if stable.  Kirk Ruths, MD

## 2021-12-12 NOTE — ED Provider Notes (Signed)
[facility] EMERGENCY DEPARTMENT Provider Note   CSN: [id number] Arrival date & time: 12/11/21  2304     History No chief complaint on file.   Randy [name] is a 80 y.o. male.  80 yo M with a chief complaints of shortness of breath and chest discomfort.  Going on for a couple days but worsening today over the past couple hours.  He was recently in the hospital for acute pulmonary edema.  He feels like it somewhat similar.  History is somewhat limited secondary to language barrier.  He denies cough congestion or fever.  Found by EMS to be acutely hypoxic.  Started on oxygen with some improvement.  Requiring up to 4 L to get him into the low 90s.  The history is provided by the patient.  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   Hearing loss    HTN (hypertension)    Hypoxia 07/01/2021    Patient Active Problem List   Diagnosis Date Noted   CHF exacerbation ([place]) 12/12/2021   SOB (shortness of breath) 11/22/2021   Elevated troponin 11/22/2021   Hyperglycemia 11/22/2021   Blood creatinine increased compared with prior measurement 11/22/2021   Acute respiratory failure with hypoxia and hypercapnia ([place]) 11/21/2021   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   Urinary tract infection 11/16/2021   Microcytic anemia 11/16/2021   Thrombocytosis 11/16/2021   Bradycardia 07/05/2021   Hematuria 07/05/2021   Anemia 07/05/2021    Acute on chronic HFrEF (heart failure with reduced ejection fraction) ([place]) 07/01/2021   CAD, 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   Stage 3b chronic kidney disease ([place])    Primary hypertension    Mixed hyperlipidemia    AKI-CKD 3B    NSTEMI (non-ST elevated myocardial infarction) ([place])    Hypertensive crisis 01/29/2021   Acute respiratory failure with hypoxia ([place]) 01/29/2021    Past Surgical History:  Procedure Laterality Date   ESOPHAGOGASTRODUODENOSCOPY (EGD) WITH PROPOFOL N/A 11/26/2021   Procedure: ESOPHAGOGASTRODUODENOSCOPY (EGD) WITH PROPOFOL;  Surgeon: Doran Stabler, MD;  Location: [facility];  Service: Gastroenterology;  Laterality: N/A;   EYE SURGERY     IABP INSERTION N/A 01/30/2021   Procedure: IABP Insertion;  Surgeon: Leonie Man, MD;  Location: [place] [facility];  Service: Cardiovascular;  Laterality: N/A;   RIGHT/LEFT HEART CATH AND CORONARY ANGIOGRAPHY N/A 01/30/2021   Procedure: RIGHT/LEFT HEART CATH AND CORONARY ANGIOGRAPHY;  Surgeon: Leonie Man, MD;  Location: [place] [facility];  Service: Cardiovascular;  Laterality: N/A;       No family history on file.  Social History   Tobacco Use   Smoking status: Never   Smokeless tobacco: Never  Substance Use Topics   Alcohol use: Never   Drug use: Never    Home Medications Prior to Admission medications   Medication Sig Start Date End Date Taking? Authorizing Provider  Acetaminophen (TYLENOL PO) Take 3 tablets by mouth 2 (two) times daily as needed (pain/headache).  [provider]  acetaminophen (TYLENOL) 325 MG tablet Take 2 tablets (650 mg total) by mouth every 4 (four) hours as needed for headache or mild pain. 07/04/21   Isaiah Serge, NP  aspirin 81 MG chewable tablet Chew 1 tablet (81 mg total) by mouth daily. 02/06/21   Hosie Poisson, MD  aspirin 81 MG EC tablet Take 1 tablet (81 mg total) by mouth daily. Swallow whole. 11/28/21   Thurnell Lose, MD  atorvastatin (LIPITOR) 80 MG  tablet Take 1 tablet (80 mg total) by mouth daily. 02/06/21   Hosie Poisson, MD  atorvastatin (LIPITOR) 80 MG tablet Take 1 tablet (80 mg total) by mouth daily. 11/28/21   Thurnell Lose, MD  carvedilol (COREG) 3.125 MG tablet Take 1 tablet (3.125 mg total) by mouth 2 (two) times daily with a meal. 11/20/21   Lavina Hamman, MD  carvedilol (COREG) 3.125 MG tablet Take 1 tablet (3.125 mg total) by mouth 2 (two) times daily. 11/28/21   Thurnell Lose, MD  clopidogrel (PLAVIX) 75 MG tablet Take 1 tablet (75 mg total) by mouth daily. 02/06/21   Hosie Poisson, MD  clopidogrel (PLAVIX) 75 MG tablet Take 1 tablet (75 mg total) by mouth daily. 11/28/21   Thurnell Lose, MD  docusate sodium (COLACE) 100 MG capsule Take 1 capsule (100 mg total) by mouth 2 (two) times daily. 11/20/21   Lavina Hamman, MD  docusate sodium (COLACE) 100 MG capsule Take 1 capsule (100 mg total) by mouth 2 (two) times daily. 11/28/21 12/28/21  Thurnell Lose, MD  ferrous sulfate 325 (65 FE) MG tablet Take 1 tablet (325 mg total) by mouth daily with breakfast. 11/21/21   Lavina Hamman, MD  ferrous sulfate 325 (65 FE) MG tablet Take 1 tablet (325 mg total) by mouth 2 (two) times daily with a meal. 11/28/21   Thurnell Lose, MD  finasteride (PROSCAR) 5 MG tablet Take 1 tablet (5 mg total) by mouth daily. 11/21/21   Lavina Hamman, MD  furosemide (LASIX) 80 MG tablet Take 1 tablet (80 mg total) by mouth daily. 11/28/21   Thurnell Lose, MD  isosorbide-hydrALAZINE (BIDIL) 20-37.5 MG tablet Take 1 tablet by mouth 3 (three) times daily. 07/04/21   Isaiah Serge, NP  isosorbide-hydrALAZINE (BIDIL) 20-37.5 MG tablet Take 1 tablet by mouth 3 (three) times daily. 11/28/21   Thurnell Lose, MD  JARDIANCE 10 MG TABS tablet Take 10 mg by mouth daily. 11/10/21   [provider]  metFORMIN (GLUCOPHAGE) 500 MG tablet Take 500 mg by mouth in the morning and at bedtime. 06/10/21   [provider]  nitroGLYCERIN  (NITROSTAT) 0.4 MG SL tablet Place 1 tablet (0.4 mg total) under the tongue every 5 (five) minutes x 3 doses as needed for chest pain. 02/05/21   Hosie Poisson, MD  pantoprazole (PROTONIX) 40 MG tablet Take 1 tablet (40 mg total) by mouth daily at 6 (six) AM. 02/06/21   Hosie Poisson, MD  pantoprazole (PROTONIX) 40 MG tablet Take 1 tablet (40 mg total) by mouth 2 (two) times daily before a meal. 11/28/21   Thurnell Lose, MD  tamsulosin (FLOMAX) 0.4 MG CAPS capsule Take 1 capsule (0.4 mg total) by mouth 2 (two) times daily. 07/05/21   Hilty, Nadean Corwin, MD  tamsulosin (FLOMAX) 0.4 MG CAPS capsule Take 2 capsules (0.8 mg total) by mouth daily. 11/28/21   Thurnell Lose, MD  vitamin B-12 (CYANOCOBALAMIN) 500 MCG  tablet Take 1 tablet (500 mcg total) by mouth daily. 11/21/21   Lavina Hamman, MD    Allergies    Patient has no known allergies.  Review of Systems   Review of Systems  Constitutional:  Negative for chills and fever.  HENT:  Negative for congestion and facial swelling.   Eyes:  Negative for discharge and visual disturbance.  Respiratory:  Positive for shortness of breath.   Cardiovascular:  Positive for chest pain. Negative for palpitations.  Gastrointestinal:  Negative for abdominal pain, diarrhea and vomiting.  Musculoskeletal:  Negative for arthralgias and myalgias.  Skin:  Negative for color change and rash.  Neurological:  Negative for tremors, syncope and headaches.  Psychiatric/Behavioral:  Negative for confusion and dysphoric mood.    Physical Exam Updated Vital Signs BP 128/60    Pulse 78    Temp 98.4 F (36.9 C) (Oral)    Resp (!) 24    SpO2 98%   Physical Exam Vitals and nursing note reviewed.  Constitutional:      Appearance: He is well-developed.  HENT:     Head: Normocephalic and atraumatic.  Eyes:     Pupils: Pupils are equal, round, and reactive to light.  Neck:     Vascular: No JVD.  Cardiovascular:     Rate and Rhythm: Normal rate and regular rhythm.      Heart sounds: No murmur heard.   No friction rub. No gallop.  Pulmonary:     Effort: No respiratory distress.     Breath sounds: No wheezing.  Abdominal:     General: There is no distension.     Tenderness: There is no abdominal tenderness. There is no guarding or rebound.  Musculoskeletal:        General: Normal range of motion.     Cervical back: Normal range of motion and neck supple.     Right lower leg: Edema present.     Left lower leg: Edema present.     Comments: Trace edema to ble.   Skin:    Coloration: Skin is not pale.     Findings: No rash.  Neurological:     Mental Status: He is alert and oriented to person, place, and time.  Psychiatric:        Behavior: Behavior normal.    ED Results / Procedures / Treatments   Labs (all labs ordered are listed, but only abnormal results are displayed) Labs Reviewed  BASIC METABOLIC PANEL - Abnormal; Notable for the following components:      Result Value   Glucose, Bld 176 (*)    BUN 42 (*)    Creatinine, Ser 1.85 (*)    Calcium 8.2 (*)    GFR, Estimated 36 (*)    All other components within normal limits  CBC - Abnormal; Notable for the following components:   WBC 16.3 (*)    Hemoglobin 11.2 (*)    HCT 38.6 (*)    MCV 79.1 (*)    MCH 23.0 (*)    MCHC 29.0 (*)    RDW 22.6 (*)    Platelets 752 (*)    All other components within normal limits  BRAIN NATRIURETIC PEPTIDE - Abnormal; Notable for the following components:   B Natriuretic Peptide 2,635.7 (*)    All other components within normal limits  I-STAT VENOUS BLOOD GAS, ED - Abnormal; Notable for the following components:   pH, Ven 7.462 (*)    pCO2, Ven 32.3 (*)  pO2, Ven 27.0 (*)    Potassium 3.1 (*)    Calcium, Ion 0.95 (*)    HCT 33.0 (*)    Hemoglobin 11.2 (*)    All other components within normal limits  TROPONIN I (HIGH SENSITIVITY) - Abnormal; Notable for the following components:   Troponin I (High Sensitivity) 114 (*)    All other  components within normal limits  TROPONIN I (HIGH SENSITIVITY) - Abnormal; Notable for the following components:   Troponin I (High Sensitivity) 159 (*)    All other components within normal limits  RESP PANEL BY RT-PCR (FLU A&B, COVID) [id number]    EKG EKG Interpretation  Date/Time:  Wednesday December 11 2021 23:16:58 EST Ventricular Rate:  77 PR Interval:  210 QRS Duration: 116 QT Interval:  420 QTC Calculation: 475 R Axis:   217 Text Interpretation: Sinus rhythm with 1st degree A-V block Right superior axis deviation Possible Anterior infarct , age undetermined ST & T wave abnormality, consider inferolateral ischemia Abnormal ECG When compared with ECG of 11/15/2021, Nonspecific ST abnormality is more pronounced Confirmed by [name] ([id number]) on 12/11/2021 11:21:00 PM  Radiology DG Chest 2 View  Result Date: 12/12/2021 CLINICAL DATA:  Shortness of breath and chest pain EXAM: CHEST - 2 VIEW COMPARISON:  11/27/2021 FINDINGS: Cardiac shadow is prominent but stable. Aortic calcifications are seen. Increased vascular congestion and edema is noted when compare with the prior exam. No effusion is seen. No bony abnormality is noted. IMPRESSION: Vascular congestion with increasing edema. Electronically Signed   By: Inez Catalina M.D.   On: 12/12/2021 00:18    Procedures Procedures   Medications Ordered in ED Medications  potassium chloride 10 mEq in 100 mL IVPB (0 mEq Intravenous Stopped 12/12/21 0341)  potassium chloride SA (KLOR-CON M) CR tablet 40 mEq (40 mEq Oral Given 12/12/21 0223)  furosemide (LASIX) injection 80 mg (80 mg Intravenous Given 12/12/21 5621)    ED Course  I have reviewed the triage vital signs and the nursing notes.  Pertinent labs & imaging results that were available during my care of the patient were reviewed by me and considered in my medical decision making (see chart for details).    MDM Rules/Calculators/A&P                         80 yo M with a cc of  sob.  Found to be hypoxic.  Increased edema on chest x-ray as viewed by me.  Exam without obvious fluid overload.  Will start on Lasix.  I discussed the case with cardiology who agreed with medical admission.  Reviewed recent notes and felt the patient be a poor candidate for catheterization and/or bypass but thought that acute fluid overload could represent his symptoms.  Also felt that his troponin had been downtrending from his last admission.  Will discuss with medicine.  CRITICAL CARE Performed by: Cecilio Asper   Total critical care time: 35 minutes  Critical care time was exclusive of separately billable procedures and treating other patients.  Critical care was necessary to treat or prevent imminent or life-threatening deterioration.  Critical care was time spent personally by me on the following activities: development of treatment plan with patient and/or surrogate as well as nursing, discussions with consultants, evaluation of patient's response to treatment, examination of patient, obtaining history from patient or surrogate, ordering and performing treatments and interventions, ordering and review of laboratory studies, ordering and review of radiographic studies,  pulse oximetry and re-evaluation of patient's condition.  The patients results and plan were reviewed and discussed.   Any x-rays performed were independently reviewed by myself.   Differential diagnosis were considered with the presenting HPI.  Medications  potassium chloride 10 mEq in 100 mL IVPB (0 mEq Intravenous Stopped 12/12/21 0341)  potassium chloride SA (KLOR-CON M) CR tablet 40 mEq (40 mEq Oral Given 12/12/21 0223)  furosemide (LASIX) injection 80 mg (80 mg Intravenous Given 12/12/21 0223)    Vitals:   12/12/21 0115 12/12/21 0145 12/12/21 0215 12/12/21 0245  BP: (!) 125/58 128/60 (!) 123/57 128/60  Pulse: 73 78 67 78  Resp: (!) 24 (!) 21 16 (!) 24  Temp:      TempSrc:      SpO2: 98% 100% 99% 98%     Final diagnoses:  Acute respiratory failure with hypoxia (HCC)  Acute on chronic systolic congestive heart failure (HCC)    Admission/ observation were discussed with the admitting physician, patient and/or family and they are comfortable with the plan.      Final Clinical Impression(s) / ED Diagnoses Final diagnoses:  Acute respiratory failure with hypoxia ([place])  Acute on chronic systolic congestive heart failure [facility])    Rx / DC Orders ED Discharge Orders     None        Deno Etienne, DO 12/12/21 0448

## 2021-12-12 NOTE — ED Notes (Signed)
Notified Rx about missing dose

## 2021-12-12 NOTE — ED Notes (Signed)
Pt using BSC. 

## 2021-12-13 DIAGNOSIS — I2510 Atherosclerotic heart disease of native coronary artery without angina pectoris: Secondary | ICD-10-CM

## 2021-12-13 DIAGNOSIS — I5023 Acute on chronic systolic (congestive) heart failure: Secondary | ICD-10-CM

## 2021-12-13 LAB — GLUCOSE, CAPILLARY
Glucose-Capillary: 167 mg/dL — ABNORMAL HIGH (ref 70–99)
Glucose-Capillary: 146 mg/dL — ABNORMAL HIGH (ref 70–99)
Glucose-Capillary: 232 mg/dL — ABNORMAL HIGH (ref 70–99)
Glucose-Capillary: 145 mg/dL — ABNORMAL HIGH (ref 70–99)

## 2021-12-13 LAB — BASIC METABOLIC PANEL
Anion gap: 9 (ref 5–15)
BUN: 54 mg/dL — ABNORMAL HIGH (ref 8–23)
CO2: 28 mmol/L (ref 22–32)
Calcium: 8.6 mg/dL — ABNORMAL LOW (ref 8.9–10.3)
Chloride: 97 mmol/L — ABNORMAL LOW (ref 98–111)
Creatinine, Ser: 2.18 mg/dL — ABNORMAL HIGH (ref 0.61–1.24)
GFR, Estimated: 29 mL/min — ABNORMAL LOW (ref >60)
Glucose, Bld: 155 mg/dL — ABNORMAL HIGH (ref 70–99)
Potassium: 2.9 mmol/L — ABNORMAL LOW (ref 3.5–5.1)
Sodium: 134 mmol/L — ABNORMAL LOW (ref 135–145)

## 2021-12-13 MED ORDER — FUROSEMIDE 40 MG PO TABS
80.0000 mg | ORAL_TABLET | Freq: Every day | ORAL | Status: DC
  Administered 2021-12-13 – 2021-12-14 (×2): 80 mg via ORAL
  Filled 2021-12-13 (×2): qty 2

## 2021-12-13 MED ORDER — POTASSIUM CHLORIDE CRYS ER 20 MEQ PO TBCR
40.0000 meq | EXTENDED_RELEASE_TABLET | ORAL | Status: AC
  Administered 2021-12-13: 09:00:00 40 meq via ORAL
  Filled 2021-12-13: qty 2

## 2021-12-13 MED ORDER — CARVEDILOL 6.25 MG PO TABS
6.2500 mg | ORAL_TABLET | Freq: Two times a day (BID) | ORAL | Status: DC
  Administered 2021-12-13 – 2021-12-14 (×2): 6.25 mg via ORAL
  Filled 2021-12-13 (×2): qty 1

## 2021-12-13 MED ORDER — SALINE SPRAY 0.65 % NA SOLN
1.0000 | NASAL | Status: DC | PRN
  Filled 2021-12-13 (×2): qty 44

## 2021-12-13 NOTE — TOC Progression Note (Signed)
Transition of Care [facility]) - Progression Note    Patient Details  Name: Randy [name] MRN: [id number] Date of Birth: 09-01-1941  [facility] of [facility]) CM/SW Contact  Zenon Mayo, RN Phone Number: 12/13/2021, 11:42 AM  Clinical Narrative:    Patient is active with [facility] for HHRN,HHPT,HHOT, NCM notified Malachy Mood with [facility] that he may be dc today or tomorrow per MD.  He lives at the [employer] [place].  He will need transportation at discharge.  TOC will continue to follow for dc.         Expected Discharge Plan and Services                                                 Social Determinants of Health (SDOH) Interventions    Readmission Risk Interventions No flowsheet data found.

## 2021-12-13 NOTE — Progress Notes (Signed)
PROGRESS NOTE    Randy [name]  UXL:[id number] DOB: 1941/04/06 DOA: 12/11/2021 PCP: Jilda Panda, MD   Brief Narrative:  HPI: Randy [name] is a 80 y.o. male with medical history significant of CAD, chronic combined CHF, CKD stage IIIb-IV, hypertension.  Recently admitted 11/21/2021-11/28/2021 for decompensated CHF and NSTEMI for which cardiology recommended medical management.  Also treated for CAP during this hospitalization.  In addition, patient had melanotic stools and was seen by GI and underwent EGD which was unremarkable with no plans for colonoscopy due to poor underlying health status.  He was placed on twice daily PPI and iron supplement.   Patient presented to the ED via EMS for evaluation of shortness of breath and chest pain.  With EMS, patient was hypoxic to 88% on room air, improved after DuoNeb, Solu-Medrol, and 3 L supplemental oxygen.  In the ED, not febrile or tachycardic.  Satting well on 4 L supplemental oxygen.  Labs showing WBC 16.3.  Hemoglobin 11.2, improved compared to recent labs.  Platelet count 752K, elevated on recent labs as well and stable.  Creatinine 1.8, stable.  EKG showing T wave abnormality in inferior and lateral leads similar to prior tracing.  Troponin 114 >159.  VBG with pH 7.46, PCO2 32.  BNP 2635.  COVID and influenza PCR negative.  Chest x-ray showing increasing vascular congestion and edema compared to prior exam. ED physician discussed the case with on-call cardiologist who felt that the patient was a poor candidate for catheterization and/or CABG and thought that fluid overload could be causing his symptoms.  Cardiologist also felt that his troponin has been downtrending from his last admission.  Patient was given IV Lasix 80 mg and potassium supplement.   Unable to obtain history from the patient due to language barrier.  He speaks [place] for which no interpreter available.  No family available at this time either.      Assessment & Plan:    Principal Problem:   CHF exacerbation (Echo) Active Problems:   Acute respiratory failure with hypoxia (HCC)   AKI-CKD 3B   CAD, multiple vessel   Elevated troponin  Acute hypoxic respiratory failure secondary to acute on chronic combined CHF Hypoxic to upper 80s on room air with EMS and initially placed on supplemental oxygen.  Currently satting well on room air.  Bibasilar rales appreciated upon auscultation of the lungs, no wheezing.  BNP significantly elevated at 2635. Chest x-ray showing increasing vascular congestion and edema compared to prior exam.  Unable to obtain history from the patient at this time due to language barrier, unclear whether he is taking his diuretic and other home medications. Echo done 11/22/2021 showing LVEF 30 to 35% with global hypokinesis, grade 3 diastolic dysfunction, and moderate mitral regurgitation.  Cardiology has switched him to oral Lasix, continue Jardiance.  They have cleared him for discharge however he has some jump in creatinine and for that I will keep him overnight.   Chest pain and elevated troponin Severe multivessel CAD LHC done in February 2022 showing severe multivessel CAD.  Patient was recently admitted for NSTEMI and cardiology had recommended medical management.  High-sensitivity troponin elevated today (114 > 159> 298), however, improved compared to recent hospitalization. EKG showing T wave abnormality in inferior and lateral leads similar to prior tracing.  Seen by cardiology and they continue to recommend medical management.  Continue BiDil, Coreg, aspirin and statin.   Leukocytosis: Resolved.   CKD stage IIIb: Baseline creatinine between 1.7 and 2.2.  Currently  around 2.2.  We will watch closely and repeat labs in the morning.   Hypertension Stable.  Continue current regimen.  Hypokalemia: 2.9.  Will replace.  DVT prophylaxis: heparin injection 5,000 Units Start: 12/12/21 0815   Code Status: Full Code  Family Communication:  None  present at bedside.  Plan of care discussed with patient in length and he verbalized understanding and agreed with it.  Status is: Inpatient  Remains inpatient appropriate because: Need to watch renal function.   Estimated body mass index is 22.78 kg/m as calculated from the following:   Height as of this encounter: 5\' 2"  (1.575 m).   Weight as of this encounter: 56.5 kg.  Nutritional Assessment: Body mass index is 22.78 kg/m.Marland Kitchen Seen by dietician.  I agree with the assessment and plan as outlined below: Nutrition Status:   Skin Assessment: I have examined the patient's skin and I agree with the wound assessment as performed by the wound care RN as outlined below:    Consultants:  Cardiology  Procedures:  None  Antimicrobials:  Anti-infectives (From admission, onward)    None          Subjective: Seen and examined.  He states that overall he feels better but he still has some shortness of breath when he lays flat.  No chest pain.  Objective: Vitals:   12/12/21 1606 12/12/21 1956 12/13/21 0034 12/13/21 0415  BP: 121/70 107/60 (!) 119/56 117/62  Pulse: 62 68 65 67  Resp: 14 18 18 20   Temp: 98.2 F (36.8 C) 98.6 F (37 C) 97.9 F (36.6 C) 98.6 F (37 C)  TempSrc: Oral Oral Oral Oral  SpO2: 99% 97% 97% 97%  Weight: 57.2 kg   56.5 kg  Height: 5\' 2"  (1.575 m)       Intake/Output Summary (Last 24 hours) at 12/13/2021 0810 Last data filed at 12/13/2021 0700 Gross per 24 hour  Intake 240 ml  Output 800 ml  Net -560 ml   Filed Weights   12/12/21 1606 12/13/21 0415  Weight: 57.2 kg 56.5 kg    Examination:  General exam: Appears calm and comfortable  Respiratory system: Clear to auscultation. Respiratory effort normal. Cardiovascular system: S1 & S2 heard, RRR. No JVD, murmurs, rubs, gallops or clicks. No pedal edema. Gastrointestinal system: Abdomen is nondistended, soft and nontender. No organomegaly or masses felt. Normal bowel sounds heard. Central  nervous system: Alert and oriented. No focal neurological deficits. Extremities: Symmetric 5 x 5 power. Skin: No rashes, lesions or ulcers Psychiatry: Judgement and insight appear normal. Mood & affect appropriate.    Data Reviewed: I have personally reviewed following labs and imaging studies  CBC: Recent Labs  Lab 12/11/21 2313 12/11/21 2343 12/12/21 1016 12/12/21 1339  WBC 16.3*  --  7.3 7.2  NEUTROABS  --   --   --  6.6  HGB 11.2* 11.2* 11.4* 11.5*  HCT 38.6* 33.0* 39.3 40.3  MCV 79.1*  --  78.8* 78.6*  PLT 752*  --  707* 390*   Basic Metabolic Panel: Recent Labs  Lab 12/11/21 2313 12/11/21 2343 12/12/21 1016 12/13/21 0240  NA 139 144 138 134*  K 3.5 3.1* 3.2* 2.9*  CL 105  --  102 97*  CO2 25  --  23 28  GLUCOSE 176*  --  255* 155*  BUN 42*  --  40* 54*  CREATININE 1.85*  --  2.02* 2.18*  CALCIUM 8.2*  --  8.6* 8.6*   GFR: Estimated Creatinine  Clearance: 20.2 mL/min (A) (by C-G formula based on SCr of 2.18 mg/dL (H)). Liver Function Tests: No results for input(s): AST, ALT, ALKPHOS, BILITOT, PROT, ALBUMIN in the last 168 hours. No results for input(s): LIPASE, AMYLASE in the last 168 hours. No results for input(s): AMMONIA in the last 168 hours. Coagulation Profile: No results for input(s): INR, PROTIME in the last 168 hours. Cardiac Enzymes: No results for input(s): CKTOTAL, CKMB, CKMBINDEX, TROPONINI in the last 168 hours. BNP (last 3 results) No results for input(s): PROBNP in the last 8760 hours. HbA1C: Recent Labs    12/12/21 1016  HGBA1C 6.7*   CBG: Recent Labs  Lab 12/12/21 1216 12/12/21 1615 12/12/21 2131 12/13/21 0615  GLUCAP 190* 170* 179* 167*   Lipid Profile: No results for input(s): CHOL, HDL, LDLCALC, TRIG, CHOLHDL, LDLDIRECT in the last 72 hours. Thyroid Function Tests: No results for input(s): TSH, T4TOTAL, FREET4, T3FREE, THYROIDAB in the last 72 hours. Anemia Panel: No results for input(s): VITAMINB12, FOLATE, FERRITIN,  TIBC, IRON, RETICCTPCT in the last 72 hours. Sepsis Labs: Recent Labs  Lab 12/12/21 1016  PROCALCITON <0.10    Recent Results (from the past 240 hour(s))  Resp Panel by RT-PCR (Flu A&B, Covid) Nasopharyngeal Swab     Status: None   Collection Time: 12/12/21  2:53 AM   Specimen: Nasopharyngeal Swab; Nasopharyngeal(NP) swabs in vial transport medium  Result Value Ref Range Status   SARS Coronavirus 2 by RT PCR NEGATIVE NEGATIVE Final    Comment: (NOTE) SARS-CoV-2 target nucleic acids are NOT DETECTED.  The SARS-CoV-2 RNA is generally detectable in upper respiratory specimens during the acute phase of infection. The lowest concentration of SARS-CoV-2 viral copies this assay can detect is 138 copies/mL. A negative result does not preclude SARS-Cov-2 infection and should not be used as the sole basis for treatment or other patient management decisions. A negative result may occur with  improper specimen collection/handling, submission of specimen other than nasopharyngeal swab, presence of viral mutation(s) within the areas targeted by this assay, and inadequate number of viral copies(<138 copies/mL). A negative result must be combined with clinical observations, patient history, and epidemiological information. The expected result is Negative.  Fact Sheet for Patients:  EntrepreneurPulse.com.au  Fact Sheet for Healthcare Providers:  IncredibleEmployment.be  This test is no t yet approved or cleared by the [place] FDA and  has been authorized for detection and/or diagnosis of SARS-CoV-2 by FDA under an Emergency Use Authorization (EUA). This EUA will remain  in effect (meaning this test can be used) for the duration of the COVID-19 declaration under Section 564(b)(1) of the Act, 21 U.S.C.section 360bbb-3(b)(1), unless the authorization is terminated  or revoked sooner.       Influenza A by PCR NEGATIVE NEGATIVE Final   Influenza B by  PCR NEGATIVE NEGATIVE Final    Comment: (NOTE) The Xpert Xpress SARS-CoV-2/FLU/RSV plus assay is intended as an aid in the diagnosis of influenza from Nasopharyngeal swab specimens and should not be used as a sole basis for treatment. Nasal washings and aspirates are unacceptable for Xpert Xpress SARS-CoV-2/FLU/RSV testing.  Fact Sheet for Patients: EntrepreneurPulse.com.au  Fact Sheet for Healthcare Providers: IncredibleEmployment.be  This test is not yet approved or cleared by the [place] FDA and has been authorized for detection and/or diagnosis of SARS-CoV-2 by FDA under an Emergency Use Authorization (EUA). This EUA will remain in effect (meaning this test can be used) for the duration of the COVID-19 declaration under Section 564(b)(1) of  the Act, 21 U.S.C. section 360bbb-3(b)(1), unless the authorization is terminated or revoked.  Performed at [facility], [place] [address], [place], [place] [address]       Radiology Studies: DG Chest 2 View  Result Date: 12/12/2021 CLINICAL DATA:  Shortness of breath and chest pain EXAM: CHEST - 2 VIEW COMPARISON:  11/27/2021 FINDINGS: Cardiac shadow is prominent but stable. Aortic calcifications are seen. Increased vascular congestion and edema is noted when compare with the prior exam. No effusion is seen. No bony abnormality is noted. IMPRESSION: Vascular congestion with increasing edema. Electronically Signed   By: Inez Catalina M.D.   On: 12/12/2021 00:18    Scheduled Meds:  aspirin EC  81 mg Oral Daily   atorvastatin  80 mg Oral Daily   carvedilol  3.125 mg Oral BID WC   clopidogrel  75 mg Oral Daily   docusate sodium  100 mg Oral BID   empagliflozin  10 mg Oral Daily   ferrous sulfate  325 mg Oral Q breakfast   finasteride  5 mg Oral Daily   furosemide  80 mg Intravenous BID   heparin  5,000 Units Subcutaneous Q8H   insulin aspart  0-5 Units Subcutaneous QHS   insulin aspart  0-9  Units Subcutaneous TID WC   isosorbide-hydrALAZINE  1 tablet Oral TID   pantoprazole  40 mg Oral Q0600   potassium chloride  40 mEq Oral Q4H   tamsulosin  0.4 mg Oral BID   Continuous Infusions:   LOS: 1 day   Time spent: 30 minutes   Darliss Cheney, MD [facility]  12/13/2021, 8:10 AM  Please page via Amion and do not message via secure chat for anything urgent. Secure chat can be used for anything non urgent.  How to contact the [facility] Attending or Consulting provider [place] or covering provider during after hours [place], for this patient?  Check the care team in [facility] and look for a) attending/consulting TRH provider listed and b) the [facility] team listed. Page or secure chat [phone]. Log into [email or web address] and use [place]'s universal password to access. If you do not have the password, please contact the hospital operator. Locate the [facility] provider you are looking for under [facility] and page to a number that you can be directly reached. If you still have difficulty reaching the provider, please page the [facility] (Director on Call) for the Hospitalists listed on amion for assistance.

## 2021-12-13 NOTE — TOC Initial Note (Signed)
Transition of Care [facility]) - Initial/Assessment Note    Patient Details  Name: Randy [name] MRN: [id number] Date of Birth: [birth year]  [facility] CM/SW Contact:    Zenon Mayo, RN Phone Number: 12/13/2021, 11:54 AM  Clinical Narrative:                 NCM spoke with patient at bedside with video intrepreter.  NCM informed him that he is still active with [facility] for [place], [place], [place] and they will continue to come out to see him.  NCM notified Malachy Mood with [facility], she states they tried to go out to see him and they had a problem getting in the [place], she state they will try again.  Patient has received housing resources on last admission, also gave it to him again.  He will need transport at discharge to the [place].  Expected Discharge Plan: [place] Barriers to Discharge: Continued Medical Work up   Patient Goals and CMS Choice Patient states their goals for this hospitalization and ongoing recovery are:: return to [place]      Expected Discharge Plan and Services Expected Discharge Plan: [place] In-house Referral: NA Discharge Planning Services: CM Consult Post Acute Care Choice: Resumption of Svcs/PTA Provider Living arrangements for the past 2 months: Homeless ([place] [place])                   DME Agency: NA       HH Arranged: RN, PT, OT HH Agency: [place] Date HH Agency Contacted: 12/13/21 Time HH Agency Contacted: 109 Representative spoke with at [place]: Malachy Mood  Prior Living Arrangements/Services Living arrangements for the past 2 months: Homeless ([place]) Lives with:: Other (Comment) (lives in [place] [facility]) Patient language and need for interpreter reviewed:: Yes Do you feel safe going back to the place where you live?: Yes      Need for Family Participation in Patient Care: Yes (Comment) Care giver support  system in place?: No (comment) Current home services: Home PT, Home OT, Home RN Criminal Activity/Legal Involvement Pertinent to Current Situation/Hospitalization: No - Comment as needed  Activities of Daily Living      Permission Sought/Granted                  Emotional Assessment Appearance:: Appears stated age Attitude/Demeanor/Rapport: Guarded Affect (typically observed): Agitated Orientation: : Oriented to Self, Oriented to Place, Oriented to  Time, Oriented to Situation Alcohol / Substance Use: Not Applicable Psych Involvement: No (comment)  Admission diagnosis:  CHF exacerbation ([place]) [I50.9] Acute on chronic systolic congestive heart failure (HCC) [I50.23] Acute respiratory failure with hypoxia ([place]) [J96.01] Patient Active Problem List   Diagnosis Date Noted   CHF exacerbation ([place]) 12/12/2021   SOB (shortness of breath) 11/22/2021   Elevated troponin 11/22/2021   Hyperglycemia 11/22/2021   Blood creatinine increased compared with prior measurement 11/22/2021   Acute respiratory failure with hypoxia and hypercapnia ([place]) 11/21/2021   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   Urinary tract infection 11/16/2021   Microcytic anemia 11/16/2021   Thrombocytosis 11/16/2021   Bradycardia 07/05/2021   Hematuria 07/05/2021   Anemia 07/05/2021   Acute on chronic HFrEF (heart failure with reduced ejection fraction) ([place]) 07/01/2021   CAD, 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   Stage 3b chronic kidney disease ([place])    Primary hypertension    Mixed hyperlipidemia    AKI-CKD 3B  NSTEMI (non-ST elevated myocardial infarction) [facility])    Hypertensive crisis 01/29/2021   Acute respiratory failure with hypoxia ([place]) 01/29/2021   PCP:  Jilda Panda, MD Pharmacy:   [facility] [id number] [place] [address] Phone: [phone] Fax:  [phone]  [facility] [address] [place] [address] Phone: [phone] Fax: [phone]     Social Determinants of Health (SDOH) Interventions    Readmission Risk Interventions Readmission Risk Prevention Plan 12/13/2021  Transportation Screening Complete  Medication Review ([place]) Complete  PCP or Specialist appointment within 3-5 days of discharge Complete  HRI or [place] Complete  SW Recovery Care/Counseling Consult Complete  [place] Not Applicable  Some recent data might be hidden

## 2021-12-13 NOTE — Progress Notes (Signed)
Progress Note  Patient Name: Randy [name] Date of Encounter: 12/13/2021  [facility] Cardiologist: Donato Heinz, MD   Subjective   No CP; dyspnea resolved  Inpatient Medications    Scheduled Meds:  aspirin EC  81 mg Oral Daily   atorvastatin  80 mg Oral Daily   carvedilol  3.125 mg Oral BID WC   clopidogrel  75 mg Oral Daily   docusate sodium  100 mg Oral BID   empagliflozin  10 mg Oral Daily   ferrous sulfate  325 mg Oral Q breakfast   finasteride  5 mg Oral Daily   furosemide  80 mg Intravenous BID   heparin  5,000 Units Subcutaneous Q8H   insulin aspart  0-5 Units Subcutaneous QHS   insulin aspart  0-9 Units Subcutaneous TID WC   isosorbide-hydrALAZINE  1 tablet Oral TID   pantoprazole  40 mg Oral Q0600   tamsulosin  0.4 mg Oral BID   Continuous Infusions:  PRN Meds: acetaminophen **OR** acetaminophen, sodium chloride   Vital Signs    Vitals:   12/12/21 1606 12/12/21 1956 12/13/21 0034 12/13/21 0415  BP: 121/70 107/60 (!) 119/56 117/62  Pulse: 62 68 65 67  Resp: 14 18 18 20   Temp: 98.2 F (36.8 C) 98.6 F (37 C) 97.9 F (36.6 C) 98.6 F (37 C)  TempSrc: Oral Oral Oral Oral  SpO2: 99% 97% 97% 97%  Weight: 57.2 kg   56.5 kg  Height: 5\' 2"  (1.575 m)       Intake/Output Summary (Last 24 hours) at 12/13/2021 0800 Last data filed at 12/13/2021 0700 Gross per 24 hour  Intake 240 ml  Output 800 ml  Net -560 ml   Last 3 Weights 12/13/2021 12/12/2021 11/28/2021  Weight (lbs) 124 lb 9 oz 126 lb 1.7 oz 123 lb 0.3 oz  Weight (kg) 56.5 kg 57.2 kg 55.8 kg      Telemetry    Sinus to sinus bradycardia- Personally Reviewed  Physical Exam   GEN: No acute distress.   Neck: No JVD Cardiac: RRR Respiratory: Clear to auscultation bilaterally. GI: Soft, nontender, non-distended  MS: No edema Neuro:  Nonfocal  Psych: Normal affect   Labs    High Sensitivity Troponin:   Recent Labs  Lab 11/21/21 2215 11/22/21 0600 12/11/21 2313  12/12/21 0113 12/12/21 1016  TROPONINIHS 449* 1,527* 114* 159* 298*     Chemistry Recent Labs  Lab 12/11/21 2313 12/11/21 2343 12/12/21 1016 12/13/21 0240  NA 139 144 138 134*  K 3.5 3.1* 3.2* 2.9*  CL 105  --  102 97*  CO2 25  --  23 28  GLUCOSE 176*  --  255* 155*  BUN 42*  --  40* 54*  CREATININE 1.85*  --  2.02* 2.18*  CALCIUM 8.2*  --  8.6* 8.6*  GFRNONAA 36*  --  32* 29*  ANIONGAP 9  --  13 9     Hematology Recent Labs  Lab 12/11/21 2313 12/11/21 2343 12/12/21 1016 12/12/21 1339  WBC 16.3*  --  7.3 7.2  RBC 4.88  --  4.99 5.13  HGB 11.2* 11.2* 11.4* 11.5*  HCT 38.6* 33.0* 39.3 40.3  MCV 79.1*  --  78.8* 78.6*  MCH 23.0*  --  22.8* 22.4*  MCHC 29.0*  --  29.0* 28.5*  RDW 22.6*  --  22.8* 23.3*  PLT 752*  --  707* 782*    BNP Recent Labs  Lab 12/11/21 2359  BNP 2,635.7*  Radiology    DG Chest 2 View  Result Date: 12/12/2021 CLINICAL DATA:  Shortness of breath and chest pain EXAM: CHEST - 2 VIEW COMPARISON:  11/27/2021 FINDINGS: Cardiac shadow is prominent but stable. Aortic calcifications are seen. Increased vascular congestion and edema is noted when compare with the prior exam. No effusion is seen. No bony abnormality is noted. IMPRESSION: Vascular congestion with increasing edema. Electronically Signed   By: Inez Catalina M.D.   On: 12/12/2021 00:18     Patient Profile     80 year old male with past medical history of coronary artery disease (turndown for coronary artery bypass and graft in February 2022), hypertension, hyperlipidemia, COPD, chronic stage III-IV kidney disease, ischemic cardiomyopathy for evaluation of chest pain and dyspnea.  Last echocardiogram Dec 2022 showed ejection fraction 30 to 35%, moderate LVH, restrictive filling, moderate pulmonary hypertension, severe left atrial enlargement, moderate mitral regurgitation, mild aortic insufficiency.    Assessment & Plan    1 acute on chronic systolic congestive heart  failure-symptoms have improved this morning.  We will change Lasix to 80 mg by mouth daily.  He can take an additional 40 mg daily for weight gain of 2 to 3 pounds or worsening lower extremity edema.  Continue Jardiance.     2 ischemic cardiomyopathy-plan to continue medical therapy.  Continue BiDil.  Increase carvedilol to 6.25 mg twice daily.  We will not add an ARB or Entresto in the setting of renal insufficiency.  Titrate medications as an outpatient.   3 coronary artery disease-continue aspirin and statin.   4 elevated troponin-mild elevation but in the setting of renal insufficiency.  Also noted to not be a surgical candidate previously.  Plan medical therapy.   5 chronic stage IV kidney disease-would recheck potassium and renal function 1 week following discharge.   6 hyperlipidemia-continue statin.   7 hypertension-blood pressure is controlled.  We will continue present medications in house and follow.  8 hypokalemia-supplement  Patient can be discharged from a cardiac standpoint on present medications.  Would arrange follow-up with APP 4 to 6 weeks following discharge.  We will sign off.  Please call with questions.  For questions or updates, please contact [place] Please consult [email or web address] for contact info under        Signed, Kirk Ruths, MD  12/13/2021, 8:00 AM

## 2021-12-14 DIAGNOSIS — I255 Ischemic cardiomyopathy: Secondary | ICD-10-CM | POA: Diagnosis present

## 2021-12-14 LAB — BASIC METABOLIC PANEL
Anion gap: 9 (ref 5–15)
BUN: 49 mg/dL — ABNORMAL HIGH (ref 8–23)
CO2: 28 mmol/L (ref 22–32)
Calcium: 8.9 mg/dL (ref 8.9–10.3)
Chloride: 99 mmol/L (ref 98–111)
Creatinine, Ser: 2.09 mg/dL — ABNORMAL HIGH (ref 0.61–1.24)
GFR, Estimated: 31 mL/min — ABNORMAL LOW (ref >60)
Glucose, Bld: 139 mg/dL — ABNORMAL HIGH (ref 70–99)
Potassium: 3.7 mmol/L (ref 3.5–5.1)
Sodium: 136 mmol/L (ref 135–145)

## 2021-12-14 LAB — GLUCOSE, CAPILLARY
Glucose-Capillary: 157 mg/dL — ABNORMAL HIGH (ref 70–99)
Glucose-Capillary: 172 mg/dL — ABNORMAL HIGH (ref 70–99)

## 2021-12-14 MED ORDER — CARVEDILOL 6.25 MG PO TABS: 6.2500 mg | ORAL_TABLET | Freq: Two times a day (BID) | ORAL | 0 refills | Status: DC

## 2021-12-14 NOTE — Discharge Summary (Addendum)
Physician Discharge Summary  Randy [name] MPN:[id number] DOB: [birth year] DOA: 12/11/2021  PCP: Jilda Panda, MD  Admit date: 12/11/2021 Discharge date: 12/14/2021 30 Day Unplanned Readmission Risk Score    Flowsheet Row ED to Hosp-Admission (Current) from 12/11/2021 in [facility]  30 Day Unplanned Readmission Risk Score (%) 35.81 Filed at 12/14/2021 0800       This score is the patient's risk of an unplanned readmission within 30 days of being discharged (0 -100%). The score is based on dignosis, age, lab data, medications, orders, and past utilization.   Low:  0-14.9   Medium: 15-21.9   High: 22-29.9   Extreme: 30 and above          Admitted From: Home Disposition: Home  Recommendations for Outpatient Follow-up:  Follow up with PCP in 1-2 weeks Please obtain BMP/CBC in one week Follow-up with cardiology in 4 to 6 weeks Please follow up with your PCP on the following pending results: Unresulted Labs (From admission, onward)    None         Home Health: Yes Equipment/Devices: None  Discharge Condition: Stable CODE STATUS: Full code Diet recommendation: Cardiac  Subjective: Seen and examined.  Sitting at the edge of the bed.  Looking very comfortable.  Has no complaints.  He is ready to go home.  Brief/Interim Summary: Randy [name] is a 80 y.o. male with medical history significant of CAD, chronic combined CHF, CKD stage IIIb-IV, hypertension.  Recently admitted 11/21/2021-11/28/2021 for decompensated CHF and NSTEMI for which cardiology recommended medical management.  Also treated for CAP during this hospitalization.  In addition, patient had melanotic stools and was seen by GI and underwent EGD which was unremarkable with no plans for colonoscopy due to poor underlying health status.  He was placed on twice daily PPI and iron supplement.   Patient presented to the ED via EMS for evaluation of shortness of breath and chest pain.  With EMS,  patient was hypoxic to 88% on room air, improved after DuoNeb, Solu-Medrol, and 3 L supplemental oxygen.  In the ED, not febrile or tachycardic.  Satting well on 4 L supplemental oxygen. EKG showing T wave abnormality in inferior and lateral leads similar to prior tracing.  Troponin 114 >159> 298.  VBG with pH 7.46, PCO2 32.  BNP 2635.  COVID and influenza PCR negative.  Chest x-ray showing increasing vascular congestion and edema compared to prior exam. ED physician discussed the case with on-call cardiologist who felt that the patient was a poor candidate for catheterization and/or CABG and thought that fluid overload could be causing his symptoms.  Patient was given IV Lasix 80 mg and potassium supplement.  Admitted under hospitalist service for the management of acute hypoxic respiratory failure secondary to acute on chronic combined CHF exacerbation, cardiology officially consulted.  He was initially continued on IV Lasix, Echo done 11/22/2021 showing LVEF 30 to 35% with global hypokinesis, grade 3 diastolic dysfunction, and moderate mitral regurgitation.  Cardiology switched him to oral Lasix and cleared him for discharge, patient has been weaned to room air and has been saturating well over 95% since more than 24 hours now.  His elevated troponins were deemed to be secondary to demand ischemia per cardiology and once again, they recommended medical management for his multivessel CAD/ischemic cardiomyopathy.  They increased his Coreg dose and recommended resuming prior to home medications.  Patient is being discharged in stable condition.  Leukocytosis: Resolved.   CKD stage IIIb: Baseline  creatinine between 1.7 and 2.2.  Currently at baseline.   Hypertension Stable.  Continue current regimen.   Hypokalemia: Resolved.  Discharge Diagnoses:  Active Problems:   Acute respiratory failure with hypoxia (HCC)   Acute on chronic systolic CHF (congestive heart failure) (HCC)   CAD, multiple vessel    Stage 3b chronic kidney disease ([place])   Primary hypertension   Elevated troponin   Ischemic cardiomyopathy    Discharge Instructions   Allergies as of 12/14/2021   No Known Allergies      Medication List     TAKE these medications    acetaminophen 325 MG tablet Commonly known as: TYLENOL Take 2 tablets (650 mg total) by mouth every 4 (four) hours as needed for headache or mild pain. What changed: Another medication with the same name was removed. Continue taking this medication, and follow the directions you see here.   Aspirin Low Dose 81 MG EC tablet Generic drug: aspirin Take 1 tablet (81 mg total) by mouth daily. Swallow whole. What changed: Another medication with the same name was removed. Continue taking this medication, and follow the directions you see here.   atorvastatin 80 MG tablet Commonly known as: LIPITOR Take 1 tablet (80 mg total) by mouth daily.   BiDil 20-37.5 MG tablet Generic drug: isosorbide-hydrALAZINE Take 1 tablet by mouth 3 (three) times daily.   carvedilol 6.25 MG tablet Commonly known as: COREG Take 1 tablet (6.25 mg total) by mouth 2 (two) times daily with a meal. What changed:  medication strength how much to take Another medication with the same name was removed. Continue taking this medication, and follow the directions you see here.   clopidogrel 75 MG tablet Commonly known as: PLAVIX Take 1 tablet (75 mg total) by mouth daily.   docusate sodium 100 MG capsule Commonly known as: Colace Take 1 capsule (100 mg total) by mouth 2 (two) times daily.   FeroSul 325 (65 FE) MG tablet Generic drug: ferrous sulfate Take 1 tablet (325 mg total) by mouth 2 (two) times daily with a meal. What changed: Another medication with the same name was removed. Continue taking this medication, and follow the directions you see here.   finasteride 5 MG tablet Commonly known as: PROSCAR Take 1 tablet (5 mg total) by mouth daily.   furosemide 80  MG tablet Commonly known as: LASIX Take 1 tablet (80 mg total) by mouth daily.   Jardiance 10 MG Tabs tablet Generic drug: empagliflozin Take 10 mg by mouth daily.   metFORMIN 500 MG tablet Commonly known as: GLUCOPHAGE Take 500 mg by mouth in the morning and at bedtime.   nitroGLYCERIN 0.4 MG SL tablet Commonly known as: NITROSTAT Place 1 tablet (0.4 mg total) under the tongue every 5 (five) minutes x 3 doses as needed for chest pain.   pantoprazole 40 MG tablet Commonly known as: PROTONIX Take 1 tablet (40 mg total) by mouth 2 (two) times daily before a meal. What changed: Another medication with the same name was removed. Continue taking this medication, and follow the directions you see here.   tamsulosin 0.4 MG Caps capsule Commonly known as: FLOMAX Take 2 capsules (0.8 mg total) by mouth daily. What changed: Another medication with the same name was removed. Continue taking this medication, and follow the directions you see here.   vitamin B-12 500 MCG tablet Commonly known as: CYANOCOBALAMIN Take 1 tablet (500 mcg total) by mouth daily.        Follow-up  Information     Jilda Panda, MD Follow up.   Specialty: Internal Medicine Why: Please follow up in a week. Contact information: [address] [place] [address] [phone]         Donato Heinz, MD .   Specialties: Cardiology, Radiology Contact information: [address] [place] [address] [phone]         Jilda Panda, MD Follow up in 1 week(s).   Specialty: Internal Medicine Contact information: [address] [place] [address] [phone]         Donato Heinz, MD .   Specialties: Cardiology, Radiology Contact information: [address] [place] [address] [phone]                No Known Allergies  Consultations: Cardiology   Procedures/Studies: DG Chest 2 View  Result Date:  12/12/2021 CLINICAL DATA:  Shortness of breath and chest pain EXAM: CHEST - 2 VIEW COMPARISON:  11/27/2021 FINDINGS: Cardiac shadow is prominent but stable. Aortic calcifications are seen. Increased vascular congestion and edema is noted when compare with the prior exam. No effusion is seen. No bony abnormality is noted. IMPRESSION: Vascular congestion with increasing edema. Electronically Signed   By: Inez Catalina M.D.   On: 12/12/2021 00:18   US RENAL  Result Date: 11/27/2021 CLINICAL DATA:  Acute kidney injury EXAM: RENAL / URINARY TRACT ULTRASOUND COMPLETE COMPARISON:  None. FINDINGS: Right Kidney: Renal measurements: 8.3 x 4.9 x 3.5 cm = volume: 75 mL. Echogenicity within normal limits. No mass or hydronephrosis visualized. Left Kidney: Renal measurements: 9.4 x 7.7 x 3.6 cm = volume: 82 mL. Echogenicity within normal limits. No mass or hydronephrosis visualized. Urinary bladder: Appears normal for degree of bladder distention. Other: None. IMPRESSION: Unremarkable renal ultrasound. Electronically Signed   By: Iven Finn M.D.   On: 11/27/2021 20:41   US RENAL  Result Date: 11/15/2021 CLINICAL DATA:  Renal dysfunction EXAM: RENAL / URINARY TRACT ULTRASOUND COMPLETE COMPARISON:  None. FINDINGS: Right Kidney: Renal measurements: 8.4 x 4.2 x 4.9 cm = volume: 89.6 mL. There is increased cortical echogenicity. There is prominence of renal pelvis. There is no perinephric fluid collection. Left Kidney: Renal measurements: 8 x 5 x 5 cm = volume: 104.4 mL. There is increased cortical echogenicity. There is mild ectasia of renal pelvis. There is no perinephric fluid collection. Bladder: Urinary bladder is unremarkable. Technologist observed ureteral jets at both ureterovesical junctions. Prostate is enlarged projecting into the base of the bladder. Prostate measures approximately 6 x 5.8 x 6.2 cm. Other: Incidental note is made of 6 mm hyperechoic focus in the spleen. This may suggest small calcification or  hemangioma. IMPRESSION: There is increased cortical echogenicity suggesting medical renal disease. There is mild pelviectasis in both kidneys without significant dilation of minor calices. Enlarged prostate. Electronically Signed   By: Elmer Picker M.D.   On: 11/15/2021 09:17   DG CHEST PORT 1 VIEW  Result Date: 11/27/2021 CLINICAL DATA:  Difficulty breathing EXAM: PORTABLE CHEST 1 VIEW COMPARISON:  Previous studies including the examination of 11/21/2021 FINDINGS: Transverse diameter of heart is increased. There is interval improvement in aeration of both lungs suggesting resolving pulmonary edema. There is some residual prominence of interstitial markings in the parahilar regions. There are no new focal pulmonary infiltrates. Crowding of markings is seen in the both lower lung fields. There is minimal blunting of lateral CP angles. There is no pneumothorax. IMPRESSION: Cardiomegaly. There is interval clearing of  alveolar pulmonary edema. There is slight prominence of interstitial markings in the parahilar regions suggesting residual mild interstitial edema or interstitial pneumonitis. No new focal pulmonary infiltrates are seen. Possible minimal pleural effusions. There is no pneumothorax. Electronically Signed   By: Elmer Picker M.D.   On: 11/27/2021 09:05   DG Chest Port 1 View  Result Date: 11/21/2021 CLINICAL DATA:  Respiratory distress EXAM: PORTABLE CHEST 1 VIEW COMPARISON:  None. FINDINGS: Hazy bilateral opacities, likely pulmonary edema. No pneumothorax or sizable pleural effusion. Cardiomediastinal contours are normal. IMPRESSION: Bilateral hazy opacities, likely pulmonary edema. Electronically Signed   By: Ulyses Jarred M.D.   On: 11/21/2021 20:10   DG Chest Port 1 View  Result Date: 11/15/2021 CLINICAL DATA:  Shortness of breath. EXAM: PORTABLE CHEST 1 VIEW COMPARISON:  06/30/2021. FINDINGS: The heart is mildly enlarged. There is atherosclerotic calcification of the aorta. The  pulmonary vasculature is prominent. Lung volumes are low and there is airspace disease at the lung bases, greater on the left than on the right. No definite effusion or pneumothorax. No acute osseous abnormality. IMPRESSION: 1. Cardiomegaly with pulmonary vascular congestion. 2. Mild airspace disease at the lung bases, possible edema or infiltrate. Electronically Signed   By: Brett Fairy M.D.   On: 11/15/2021 03:48   ECHOCARDIOGRAM COMPLETE  Result Date: 11/22/2021    ECHOCARDIOGRAM REPORT   Patient Name:   VENKAT ANKNEY Date of Exam: 11/22/2021 Medical Rec #:  [id number]          Height: Accession #:    [id number]         Weight: Date of Birth:  1941-04-19           BSA: Patient Age:    50 years           BP:           127/71 mmHg Patient Gender: M                  HR:           73 bpm. Exam Location:  Inpatient Procedure: 2D Echo, Cardiac Doppler and Color Doppler Indications:    CHF  History:        Patient has no prior history of Echocardiogram examinations.                 Signs/Symptoms:Shortness of Breath.  Sonographer:    Glo Herring Referring Phys: [id number] [place]  1. Left ventricular ejection fraction, by estimation, is 30 to 35%. The left ventricle has moderately decreased function. The left ventricle demonstrates global hypokinesis. There is moderate left ventricular hypertrophy. Left ventricular diastolic parameters are consistent with Grade III diastolic dysfunction (restrictive). Elevated left atrial pressure.  2. Right ventricular systolic function is normal. The right ventricular size is normal. There is moderately elevated pulmonary artery systolic pressure. The estimated right ventricular systolic pressure is 83.1 mmHg.  3. Left atrial size was severely dilated.  4. The mitral valve is normal in structure. Moderate mitral valve regurgitation. Appears functional.  5. The aortic valve is tricuspid. Aortic valve regurgitation is mild. Aortic valve  sclerosis/calcification is present, without any evidence of aortic stenosis.  6. The inferior vena cava is dilated in size with <50% respiratory variability, suggesting right atrial pressure of 15 mmHg. FINDINGS  Left Ventricle: Left ventricular ejection fraction, by estimation, is 30 to 35%. The left ventricle has moderately decreased function. The left ventricle demonstrates global hypokinesis. The left ventricular internal cavity size  was normal in size. There is moderate left ventricular hypertrophy. Left ventricular diastolic parameters are consistent with Grade III diastolic dysfunction (restrictive). Elevated left atrial pressure. Right Ventricle: The right ventricular size is normal. No increase in right ventricular wall thickness. Right ventricular systolic function is normal. There is moderately elevated pulmonary artery systolic pressure. The tricuspid regurgitant velocity is 3.04 m/s, and with an assumed right atrial pressure of 15 mmHg, the estimated right ventricular systolic pressure is 38.1 mmHg. Left Atrium: Left atrial size was severely dilated. Right Atrium: Right atrial size was normal in size. Pericardium: Trivial pericardial effusion is present. Mitral Valve: The mitral valve is normal in structure. Moderate mitral valve regurgitation. Tricuspid Valve: The tricuspid valve is normal in structure. Tricuspid valve regurgitation is trivial. Aortic Valve: The aortic valve is tricuspid. Aortic valve regurgitation is mild. Aortic valve sclerosis/calcification is present, without any evidence of aortic stenosis. Pulmonic Valve: The pulmonic valve was not well visualized. Pulmonic valve regurgitation is trivial. Aorta: The aortic root and ascending aorta are structurally normal, with no evidence of dilitation. Venous: The inferior vena cava is dilated in size with less than 50% respiratory variability, suggesting right atrial pressure of 15 mmHg. IAS/Shunts: The interatrial septum was not well  visualized.  LEFT VENTRICLE PLAX 2D LVIDd:         4.90 cm      Diastology LVIDs:         4.20 cm      LV e' medial:    3.15 cm/s LV PW:         1.40 cm      LV E/e' medial:  31.1 LV IVS:        1.40 cm      LV e' lateral:   9.57 cm/s LVOT diam:     2.00 cm      LV E/e' lateral: 10.3 LVOT Area:     3.14 cm  LV Volumes (MOD) LV vol d, MOD A2C: 149.0 ml LV vol d, MOD A4C: 162.0 ml LV vol s, MOD A2C: 100.0 ml LV vol s, MOD A4C: 108.0 ml LV SV MOD A2C:     49.0 ml LV SV MOD A4C:     162.0 ml LV SV MOD BP:      49.3 ml RIGHT VENTRICLE             IVC RV Basal diam:  3.50 cm     IVC diam: 2.60 cm RV S prime:     15.10 cm/s LEFT ATRIUM             RIGHT ATRIUM LA diam:        4.90 cm RA Area:     18.80 cm LA Vol (A2C):   91.7 ml RA Volume:   48.70 ml LA Vol (A4C):   87.6 ml LA Biplane Vol: 91.8 ml                        PULMONIC VALVE AORTA                 PV Vmax:       0.92 m/s Ao Root diam: 3.40 cm PV Peak grad:  3.4 mmHg Ao Asc diam:  3.10 cm  MITRAL VALVE               TRICUSPID VALVE MV Area (PHT): 4.63 cm    TR Peak grad:   37.0 mmHg MV Decel Time: 164 msec  TR Vmax:        304.00 cm/s MV E velocity: 98.10 cm/s MV A velocity: 44.60 cm/s  SHUNTS MV E/A ratio:  2.20        Systemic Diam: 2.00 cm Oswaldo Milian MD Electronically signed by Oswaldo Milian MD Signature Date/Time: 11/22/2021/2:46:10 PM    Final    ECHOCARDIOGRAM COMPLETE  Result Date: 11/16/2021    ECHOCARDIOGRAM REPORT   Patient Name:   JERRE VANDRUNEN Date of Exam: 11/16/2021 Medical Rec #:  [id number]          Height:       57.0 in Accession #:    [id number]         Weight:       135.0 lb Date of Birth:  [birth year]           BSA:          1.522 m Patient Age:    49 years           BP:           137/64 mmHg Patient Gender: M                  HR:           58 bpm. Exam Location:  Inpatient Procedure: 2D Echo, Cardiac Doppler and Color Doppler Indications:    T70.01 Acute systolic (congestive) heart failure  History:        Patient  has prior history of Echocardiogram examinations, most                 recent 07/01/2021. CHF, Previous Myocardial Infarction and CAD,                 Arrythmias:Bradycardia; Risk Factors:Hypertension.  Sonographer:    Glo Herring Referring Phys: [id number] [place]  1. Left ventricular ejection fraction, by estimation, is 25%. The left ventricle has severely decreased function. The left ventricle demonstrates regional wall motion abnormalities (see scoring diagram/findings for description). There is mild left ventricular hypertrophy. Left ventricular diastolic parameters are indeterminate.  2. Right ventricular systolic function is mildly reduced. The right ventricular size is normal. There is mildly elevated pulmonary artery systolic pressure. The estimated right ventricular systolic pressure is 75.9 mmHg.  3. Left atrial size was severely dilated.  4. Right atrial size was moderately dilated.  5. The mitral valve is grossly normal. Moderate to severe mitral valve regurgitation. Systolic flow reversals noted in the right inferior pulmonary vein. MR mechanism likely secondary to LV dysfunction, leaflet tenting noted. No evidence of mitral stenosis.  6. The aortic valve is grossly normal. There is mild calcification of the aortic valve. There is mild thickening of the aortic valve. Aortic valve regurgitation is mild. Aortic valve sclerosis/calcification is present, without any evidence of aortic stenosis.  7. The inferior vena cava is dilated in size with >50% respiratory variability, suggesting right atrial pressure of 8 mmHg. FINDINGS  Left Ventricle: Left ventricular ejection fraction, by estimation, is 25%. The left ventricle has severely decreased function. The left ventricle demonstrates regional wall motion abnormalities. The left ventricular internal cavity size was normal in size. There is mild left ventricular hypertrophy. Left ventricular diastolic parameters are indeterminate.  LV Wall  Scoring: The entire inferior wall, mid anteroseptal segment, and mid inferolateral segment are akinetic. The inferior septum, basal anteroseptal segment, basal inferolateral segment, apical lateral segment, and apical anterior segment are hypokinetic. Right Ventricle: The right ventricular size is normal. No increase  in right ventricular wall thickness. Right ventricular systolic function is mildly reduced. There is mildly elevated pulmonary artery systolic pressure. The tricuspid regurgitant velocity  is 3.01 m/s, and with an assumed right atrial pressure of 8 mmHg, the estimated right ventricular systolic pressure is 84.6 mmHg. Left Atrium: Left atrial size was severely dilated. Right Atrium: Right atrial size was moderately dilated. Pericardium: There is no evidence of pericardial effusion. Mitral Valve: The mitral valve is grossly normal. Moderate to severe mitral valve regurgitation. No evidence of mitral valve stenosis. Tricuspid Valve: The tricuspid valve is normal in structure. Tricuspid valve regurgitation is mild . No evidence of tricuspid stenosis. Aortic Valve: The aortic valve is grossly normal. There is mild calcification of the aortic valve. There is mild thickening of the aortic valve. Aortic valve regurgitation is mild. Aortic valve sclerosis/calcification is present, without any evidence of aortic stenosis. Aortic valve mean gradient measures 5.0 mmHg. Aortic valve peak gradient measures 9.1 mmHg. Aortic valve area, by VTI measures 1.45 cm. Pulmonic Valve: The pulmonic valve was normal in structure. Pulmonic valve regurgitation is trivial. No evidence of pulmonic stenosis. Aorta: The aortic root is normal in size and structure. Venous: A pattern of systolic flow reversal, suggestive of severe mitral regurgitation is recorded from the right lower pulmonary vein. The inferior vena cava is dilated in size with greater than 50% respiratory variability, suggesting right atrial pressure of 8 mmHg.  IAS/Shunts: No atrial level shunt detected by color flow Doppler.  LEFT VENTRICLE PLAX 2D LVIDd:         5.40 cm      Diastology LVIDs:         4.40 cm      LV e' medial:    3.48 cm/s LV PW:         1.20 cm      LV E/e' medial:  33.0 LV IVS:        1.20 cm      LV e' lateral:   5.87 cm/s LVOT diam:     2.00 cm      LV E/e' lateral: 19.6 LV SV:         44 LV SV Index:   29 LVOT Area:     3.14 cm     3D Volume EF                             LV 3D EDV:   152.71 ml                             LV 3D ESV:   97.60 ml LV Volumes (MOD) LV vol d, MOD A2C: 148.0 ml 3D Volume EF LV vol d, MOD A4C: 155.0 ml LV 3D EF:    37.00 % LV vol s, MOD A2C: 89.9 ml  LV 3D EDV:   128100.00 mm LV vol s, MOD A4C: 95.5 ml  LV 3D ESV:   80700.00 mm LV SV MOD A2C:     58.1 ml  LV 3D SV:    47400.00 mm LV SV MOD A4C:     155.0 ml LV SV MOD BP:      58.1 ml RIGHT VENTRICLE            IVC RV Basal diam:  4.20 cm    IVC diam: 2.10 cm RV S prime:     9.14 cm/s LEFT ATRIUM  Index        RIGHT ATRIUM           Index LA diam:        5.30 cm 3.48 cm/m   RA Area:     20.80 cm LA Vol (A2C):   91.2 ml 59.92 ml/m  RA Volume:   54.00 ml  35.48 ml/m LA Vol (A4C):   98.7 ml 64.85 ml/m LA Biplane Vol: 99.4 ml 65.31 ml/m  AORTIC VALVE                     PULMONIC VALVE AV Area (Vmax):    1.49 cm      PV Vmax:       0.81 m/s AV Area (Vmean):   1.53 cm      PV Peak grad:  2.6 mmHg AV Area (VTI):     1.45 cm AV Vmax:           151.00 cm/s AV Vmean:          100.000 cm/s AV VTI:            0.306 m AV Peak Grad:      9.1 mmHg AV Mean Grad:      5.0 mmHg LVOT Vmax:         71.80 cm/s LVOT Vmean:        48.600 cm/s LVOT VTI:          0.141 m LVOT/AV VTI ratio: 0.46  AORTA Ao Root diam: 3.30 cm Ao Asc diam:  3.20 cm MITRAL VALVE                  TRICUSPID VALVE MV Area (PHT): 5.70 cm       TR Peak grad:   36.2 mmHg MV Decel Time: 133 msec       TR Vmax:        301.00 cm/s MR Peak grad:    104.0 mmHg MR Mean grad:    60.0 mmHg    SHUNTS MR Vmax:          510.00 cm/s  Systemic VTI:  0.14 m MR Vmean:        363.0 cm/s   Systemic Diam: 2.00 cm MR PISA:         1.57 cm MR PISA Eff ROA: 11 mm MR PISA Radius:  0.50 cm MV E velocity: 115.00 cm/s Cherlynn Kaiser MD Electronically signed by Cherlynn Kaiser MD Signature Date/Time: 11/16/2021/1:03:58 PM    Final      Discharge Exam: Vitals:   12/14/21 0836 12/14/21 1129  BP: (!) 116/57 (!) 113/52  Pulse: 61 (!) 57  Resp:  18  Temp:  98 F (36.7 C)  SpO2:  100%   Vitals:   12/13/21 2051 12/14/21 0336 12/14/21 0836 12/14/21 1129  BP: (!) 106/50 118/69 (!) 116/57 (!) 113/52  Pulse: 66 63 61 (!) 57  Resp: 18 16  18   Temp: 98 F (36.7 C) 97.8 F (36.6 C)  98 F (36.7 C)  TempSrc: Oral Oral  Oral  SpO2: 99% 97%  100%  Weight:  56.3 kg    Height:        General: Pt is alert, awake, not in acute distress Cardiovascular: RRR, S1/S2 +, no rubs, no gallops Respiratory: CTA bilaterally, no wheezing, no rhonchi Abdominal: Soft, NT, ND, bowel sounds + Extremities: no edema, no cyanosis    The results of significant diagnostics from this hospitalization (including imaging, microbiology,  ancillary and laboratory) are listed below for reference.     Microbiology: Recent Results (from the past 240 hour(s))  Resp Panel by RT-PCR (Flu A&B, Covid) Nasopharyngeal Swab     Status: None   Collection Time: 12/12/21  2:53 AM   Specimen: Nasopharyngeal Swab; Nasopharyngeal(NP) swabs in vial transport medium  Result Value Ref Range Status   SARS Coronavirus 2 by RT PCR NEGATIVE NEGATIVE Final    Comment: (NOTE) SARS-CoV-2 target nucleic acids are NOT DETECTED.  The SARS-CoV-2 RNA is generally detectable in upper respiratory specimens during the acute phase of infection. The lowest concentration of SARS-CoV-2 viral copies this assay can detect is 138 copies/mL. A negative result does not preclude SARS-Cov-2 infection and should not be used as the sole basis for treatment or other patient  management decisions. A negative result may occur with  improper specimen collection/handling, submission of specimen other than nasopharyngeal swab, presence of viral mutation(s) within the areas targeted by this assay, and inadequate number of viral copies(<138 copies/mL). A negative result must be combined with clinical observations, patient history, and epidemiological information. The expected result is Negative.  Fact Sheet for Patients:  EntrepreneurPulse.com.au  Fact Sheet for Healthcare Providers:  IncredibleEmployment.be  This test is no t yet approved or cleared by the [place] FDA and  has been authorized for detection and/or diagnosis of SARS-CoV-2 by FDA under an Emergency Use Authorization (EUA). This EUA will remain  in effect (meaning this test can be used) for the duration of the COVID-19 declaration under Section 564(b)(1) of the Act, 21 U.S.C.section 360bbb-3(b)(1), unless the authorization is terminated  or revoked sooner.       Influenza A by PCR NEGATIVE NEGATIVE Final   Influenza B by PCR NEGATIVE NEGATIVE Final    Comment: (NOTE) The Xpert Xpress SARS-CoV-2/FLU/RSV plus assay is intended as an aid in the diagnosis of influenza from Nasopharyngeal swab specimens and should not be used as a sole basis for treatment. Nasal washings and aspirates are unacceptable for Xpert Xpress SARS-CoV-2/FLU/RSV testing.  Fact Sheet for Patients: EntrepreneurPulse.com.au  Fact Sheet for Healthcare Providers: IncredibleEmployment.be  This test is not yet approved or cleared by the [place] FDA and has been authorized for detection and/or diagnosis of SARS-CoV-2 by FDA under an Emergency Use Authorization (EUA). This EUA will remain in effect (meaning this test can be used) for the duration of the COVID-19 declaration under Section 564(b)(1) of the Act, 21 U.S.C. section 360bbb-3(b)(1),  unless the authorization is terminated or revoked.  Performed at [facility], [place] [address], [place],  [address]      Labs: BNP (last 3 results) Recent Labs    11/27/21 0351 11/28/21 0301 12/11/21 2359  BNP 4,311.1* 3,057.4* 2,831.5*   Basic Metabolic Panel: Recent Labs  Lab 12/11/21 2313 12/11/21 2343 12/12/21 1016 12/13/21 0240 12/14/21 0504  NA 139 144 138 134* 136  K 3.5 3.1* 3.2* 2.9* 3.7  CL 105  --  102 97* 99  CO2 25  --  23 28 28   GLUCOSE 176*  --  255* 155* 139*  BUN 42*  --  40* 54* 49*  CREATININE 1.85*  --  2.02* 2.18* 2.09*  CALCIUM 8.2*  --  8.6* 8.6* 8.9   Liver Function Tests: No results for input(s): AST, ALT, ALKPHOS, BILITOT, PROT, ALBUMIN in the last 168 hours. No results for input(s): LIPASE, AMYLASE in the last 168 hours. No results for input(s): AMMONIA in the last 168 hours.  CBC: Recent Labs  Lab 12/11/21 2313 12/11/21 2343 12/12/21 1016 12/12/21 1339  WBC 16.3*  --  7.3 7.2  NEUTROABS  --   --   --  6.6  HGB 11.2* 11.2* 11.4* 11.5*  HCT 38.6* 33.0* 39.3 40.3  MCV 79.1*  --  78.8* 78.6*  PLT 752*  --  707* 782*   Cardiac Enzymes: No results for input(s): CKTOTAL, CKMB, CKMBINDEX, TROPONINI in the last 168 hours. BNP: Invalid input(s): POCBNP CBG: Recent Labs  Lab 12/13/21 1212 12/13/21 1523 12/13/21 2128 12/14/21 0551 12/14/21 1134  GLUCAP 146* 232* 145* 157* 172*   D-Dimer No results for input(s): DDIMER in the last 72 hours. Hgb A1c Recent Labs    12/12/21 1016  HGBA1C 6.7*   Lipid Profile No results for input(s): CHOL, HDL, LDLCALC, TRIG, CHOLHDL, LDLDIRECT in the last 72 hours. Thyroid function studies No results for input(s): TSH, T4TOTAL, T3FREE, THYROIDAB in the last 72 hours.  Invalid input(s): FREET3 Anemia work up No results for input(s): VITAMINB12, FOLATE, FERRITIN, TIBC, IRON, RETICCTPCT in the last 72 hours. Urinalysis    Component Value Date/Time   COLORURINE STRAW (A)  12/12/2021 1333   APPEARANCEUR CLEAR 12/12/2021 1333   LABSPEC 1.006 12/12/2021 1333   PHURINE 5.0 12/12/2021 1333   GLUCOSEU NEGATIVE 12/12/2021 1333   HGBUR SMALL (A) 12/12/2021 1333   BILIRUBINUR NEGATIVE 12/12/2021 1333   KETONESUR NEGATIVE 12/12/2021 1333   PROTEINUR NEGATIVE 12/12/2021 1333   UROBILINOGEN 0.2 08/06/2011 0313   NITRITE NEGATIVE 12/12/2021 1333   LEUKOCYTESUR NEGATIVE 12/12/2021 1333   Sepsis Labs Invalid input(s): PROCALCITONIN,  WBC,  LACTICIDVEN Microbiology Recent Results (from the past 240 hour(s))  Resp Panel by RT-PCR (Flu A&B, Covid) Nasopharyngeal Swab     Status: None   Collection Time: 12/12/21  2:53 AM   Specimen: Nasopharyngeal Swab; Nasopharyngeal(NP) swabs in vial transport medium  Result Value Ref Range Status   SARS Coronavirus 2 by RT PCR NEGATIVE NEGATIVE Final    Comment: (NOTE) SARS-CoV-2 target nucleic acids are NOT DETECTED.  The SARS-CoV-2 RNA is generally detectable in upper respiratory specimens during the acute phase of infection. The lowest concentration of SARS-CoV-2 viral copies this assay can detect is 138 copies/mL. A negative result does not preclude SARS-Cov-2 infection and should not be used as the sole basis for treatment or other patient management decisions. A negative result may occur with  improper specimen collection/handling, submission of specimen other than nasopharyngeal swab, presence of viral mutation(s) within the areas targeted by this assay, and inadequate number of viral copies(<138 copies/mL). A negative result must be combined with clinical observations, patient history, and epidemiological information. The expected result is Negative.  Fact Sheet for Patients:  EntrepreneurPulse.com.au  Fact Sheet for Healthcare Providers:  IncredibleEmployment.be  This test is no t yet approved or cleared by the [place] FDA and  has been authorized for detection and/or  diagnosis of SARS-CoV-2 by FDA under an Emergency Use Authorization (EUA). This EUA will remain  in effect (meaning this test can be used) for the duration of the COVID-19 declaration under Section 564(b)(1) of the Act, 21 U.S.C.section 360bbb-3(b)(1), unless the authorization is terminated  or revoked sooner.       Influenza A by PCR NEGATIVE NEGATIVE Final   Influenza B by PCR NEGATIVE NEGATIVE Final    Comment: (NOTE) The Xpert Xpress SARS-CoV-2/FLU/RSV plus assay is intended as an aid in the diagnosis of influenza from Nasopharyngeal swab specimens and should not be used  as a sole basis for treatment. Nasal washings and aspirates are unacceptable for Xpert Xpress SARS-CoV-2/FLU/RSV testing.  Fact Sheet for Patients: EntrepreneurPulse.com.au  Fact Sheet for Healthcare Providers: IncredibleEmployment.be  This test is not yet approved or cleared by the [place] FDA and has been authorized for detection and/or diagnosis of SARS-CoV-2 by FDA under an Emergency Use Authorization (EUA). This EUA will remain in effect (meaning this test can be used) for the duration of the COVID-19 declaration under Section 564(b)(1) of the Act, 21 U.S.C. section 360bbb-3(b)(1), unless the authorization is terminated or revoked.  Performed at [facility], [place] [address], [place], [place] [address]      Time coordinating discharge: Over 30 minutes  SIGNED:   Darliss Cheney, MD  [facility] 12/14/2021, 12:23 PM  If 7PM-7AM, please contact night-coverage [email or web address]

## 2021-12-14 NOTE — Care Management (Signed)
Provided with [employer] for DC

## 2021-12-16 ENCOUNTER — Emergency Department (HOSPITAL_COMMUNITY)
Admission: EM | Admit: 2021-12-16 | Discharge: 2021-12-16 | Disposition: A | Discharge status: Home / Self Care | Payer: Medicare Other

## 2021-12-16 NOTE — ED Notes (Signed)
Pt was seen ambulating out of department.

## 2021-12-18 ENCOUNTER — Emergency Department (HOSPITAL_COMMUNITY): Payer: Medicare Other

## 2021-12-18 ENCOUNTER — Emergency Department (HOSPITAL_COMMUNITY)
Admission: EM | Admit: 2021-12-18 | Discharge: 2021-12-19 | Disposition: A | Discharge status: Home / Self Care | Payer: Medicare Other | Attending: Emergency Medicine | Admitting: Emergency Medicine

## 2021-12-18 ENCOUNTER — Encounter (HOSPITAL_COMMUNITY): Payer: Self-pay

## 2021-12-18 ENCOUNTER — Other Ambulatory Visit: Payer: Self-pay

## 2021-12-18 DIAGNOSIS — Z7901 Long term (current) use of anticoagulants: Secondary | ICD-10-CM | POA: Insufficient documentation

## 2021-12-18 DIAGNOSIS — K5900 Constipation, unspecified: Secondary | ICD-10-CM | POA: Diagnosis not present

## 2021-12-18 DIAGNOSIS — N1832 Chronic kidney disease, stage 3b: Secondary | ICD-10-CM | POA: Diagnosis not present

## 2021-12-18 DIAGNOSIS — I2510 Atherosclerotic heart disease of native coronary artery without angina pectoris: Secondary | ICD-10-CM | POA: Insufficient documentation

## 2021-12-18 DIAGNOSIS — L03115 Cellulitis of right lower limb: Secondary | ICD-10-CM | POA: Diagnosis not present

## 2021-12-18 DIAGNOSIS — M7989 Other specified soft tissue disorders: Secondary | ICD-10-CM | POA: Diagnosis present

## 2021-12-18 DIAGNOSIS — Z7982 Long term (current) use of aspirin: Secondary | ICD-10-CM | POA: Diagnosis not present

## 2021-12-18 DIAGNOSIS — I5023 Acute on chronic systolic (congestive) heart failure: Secondary | ICD-10-CM | POA: Insufficient documentation

## 2021-12-18 DIAGNOSIS — I130 Hypertensive heart and chronic kidney disease with heart failure and stage 1 through stage 4 chronic kidney disease, or unspecified chronic kidney disease: Secondary | ICD-10-CM | POA: Diagnosis not present

## 2021-12-18 LAB — COMPREHENSIVE METABOLIC PANEL
ALT: 32 U/L (ref 0–44)
AST: 37 U/L (ref 15–41)
Albumin: 3.9 g/dL (ref 3.5–5.0)
Alkaline Phosphatase: 74 U/L (ref 38–126)
Anion gap: 10 (ref 5–15)
BUN: 31 mg/dL — ABNORMAL HIGH (ref 8–23)
CO2: 21 mmol/L — ABNORMAL LOW (ref 22–32)
Calcium: 8.3 mg/dL — ABNORMAL LOW (ref 8.9–10.3)
Chloride: 108 mmol/L (ref 98–111)
Creatinine, Ser: 1.45 mg/dL — ABNORMAL HIGH (ref 0.61–1.24)
GFR, Estimated: 48 mL/min — ABNORMAL LOW (ref >60)
Glucose, Bld: 152 mg/dL — ABNORMAL HIGH (ref 70–99)
Potassium: 3.6 mmol/L (ref 3.5–5.1)
Sodium: 139 mmol/L (ref 135–145)
Total Bilirubin: 0.5 mg/dL (ref 0.3–1.2)
Total Protein: 7.8 g/dL (ref 6.5–8.1)

## 2021-12-18 LAB — CBC
HCT: 40.1 % (ref 39.0–52.0)
Hemoglobin: 11.5 g/dL — ABNORMAL LOW (ref 13.0–17.0)
MCH: 23.1 pg — ABNORMAL LOW (ref 26.0–34.0)
MCHC: 28.7 g/dL — ABNORMAL LOW (ref 30.0–36.0)
MCV: 80.5 fL (ref 80.0–100.0)
Platelets: 677 10*3/uL — ABNORMAL HIGH (ref 150–400)
RBC: 4.98 MIL/uL (ref 4.22–5.81)
RDW: 23.6 % — ABNORMAL HIGH (ref 11.5–15.5)
WBC: 16.1 10*3/uL — ABNORMAL HIGH (ref 4.0–10.5)
nRBC: 0 % (ref 0.0–0.2)

## 2021-12-18 LAB — SEDIMENTATION RATE: Sed Rate: 32 mm/hr — ABNORMAL HIGH (ref 0–16)

## 2021-12-18 LAB — LIPASE, BLOOD: Lipase: 68 U/L — ABNORMAL HIGH (ref 11–51)

## 2021-12-18 NOTE — ED Provider Notes (Signed)
Emergency Medicine Provider Triage Evaluation Note  Randy [name] , [name] 80 y.o. male  was evaluated in triage.  Pt complains of abdominal pain x 3 days. Has associated constipation. Tried medications given by doctor meds tried at home. Denies fever, chills, dysuria, hematuria.    Review of Systems  Positive: Abdominal pain, constipation Negative: Fever, chills  Physical Exam  BP 119/89    Pulse 80    Temp 98.2 F (36.8 C) (Oral)    Resp 18    SpO2 99%  Gen:   Awake, no distress   Resp:  Normal effort  MSK:   Moves extremities without difficulty  Other:  Diffuse abdominal TTP  Medical Decision Making  Medically screening exam initiated at 4:50 PM.  Appropriate orders placed.  Randy [name] was informed that the remainder of the evaluation will be completed by another provider, this initial triage assessment does not replace that evaluation, and the importance of remaining in the ED until their evaluation is complete.    Randy Park A, PA-C 12/18/21 1701    Randy Dessert, MD 12/19/21 1353

## 2021-12-18 NOTE — ED Provider Notes (Signed)
[facility] DEPT Provider Note   CSN: [id number] Arrival date & time: 12/18/21  1632     History Chief Complaint  Patient presents with   Abdominal Pain   Constipation    Randy [name] is a 80 y.o. male.  The history is provided by the patient. The history is limited by a language barrier. A language interpreter was used.  Abdominal Pain Associated symptoms: constipation   Associated symptoms: no chest pain, no chills, no cough, no dysuria, no fatigue, no fever, no hematuria, no nausea, no shortness of breath, no sore throat and no vomiting   Constipation Associated symptoms: abdominal pain   Associated symptoms: no back pain, no dysuria, no fever, no nausea and no vomiting   Patient is 80 year old male with history of CHF, CAD, CKD, HTN, and a hospitalization 5 days ago for CHF exacerbation, presents to the ED today for multiple complaints.  Initially in triage, he endorsed abdominal pain and constipation.  On my initial assessment, patient reports a painful swollen right foot.  He has been able to bear weight, although this worsens his pain.  He denies any recent fevers or chills.  He denies any other areas of extremity pain.  When asked about his abdominal pain and constipation, patient states that he has had a bowel movement today.  He does endorse rectal pain with bowel movement.  He also endorses suprapubic pain.  Patient denies any injuries to the area of his right foot.  Patient currently lives alone.    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   Hearing loss    HTN (hypertension)    Hypoxia 07/01/2021    Patient Active Problem List   Diagnosis Date Noted   Ischemic cardiomyopathy 12/14/2021   CHF exacerbation ([place]) 12/12/2021   SOB (shortness of breath)  11/22/2021   Elevated troponin 11/22/2021   Hyperglycemia 11/22/2021   Blood creatinine increased compared with prior measurement 11/22/2021   Acute respiratory failure with hypoxia and hypercapnia ([place]) 11/21/2021   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   Urinary tract infection 11/16/2021   Microcytic anemia 11/16/2021   Thrombocytosis 11/16/2021   Bradycardia 07/05/2021   Hematuria 07/05/2021   Anemia 07/05/2021   Acute on chronic systolic CHF (congestive heart failure) ([place]) 07/01/2021   CAD, 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   Stage 3b chronic kidney disease ([place])    Primary hypertension    Mixed hyperlipidemia    AKI-CKD 3B    NSTEMI (non-ST elevated myocardial infarction) ([place])    Hypertensive crisis 01/29/2021   Acute respiratory failure with hypoxia ([place]) 01/29/2021    Past Surgical History:  Procedure Laterality Date   ESOPHAGOGASTRODUODENOSCOPY (EGD) WITH PROPOFOL N/A 11/26/2021   Procedure: ESOPHAGOGASTRODUODENOSCOPY (EGD) WITH PROPOFOL;  Surgeon: Doran Stabler, MD;  Location: [facility];  Service: Gastroenterology;  Laterality: N/A;   EYE SURGERY     IABP INSERTION N/A 01/30/2021   Procedure: IABP Insertion;  Surgeon: Leonie Man, MD;  Location: [facility];  Service: Cardiovascular;  Laterality: N/A;   RIGHT/LEFT HEART CATH AND CORONARY ANGIOGRAPHY N/A 01/30/2021   Procedure: RIGHT/LEFT HEART CATH AND CORONARY ANGIOGRAPHY;  Surgeon: Leonie Man, MD;  Location: [facility];  Service: Cardiovascular;  Laterality: N/A;       History reviewed. No pertinent  family history.  Social History   Tobacco Use   Smoking status: Never   Smokeless tobacco: Never  Substance Use Topics   Alcohol use: Never   Drug use: Never    Home Medications Prior to Admission medications   Medication Sig Start Date End Date Taking? Authorizing Provider  cefUROXime (CEFTIN) 500 MG tablet Take 1 tablet (500 mg total) by mouth 2 (two) times  daily with a meal. 12/19/21  Yes Molpus, John, MD  acetaminophen (TYLENOL) 325 MG tablet Take 2 tablets (650 mg total) by mouth every 4 (four) hours as needed for headache or mild pain. 07/04/21   Isaiah Serge, NP  aspirin 81 MG EC tablet Take 1 tablet (81 mg total) by mouth daily. Swallow whole. 11/28/21   Thurnell Lose, MD  atorvastatin (LIPITOR) 80 MG tablet Take 1 tablet (80 mg total) by mouth daily. 02/06/21   Hosie Poisson, MD  carvedilol (COREG) 6.25 MG tablet Take 1 tablet (6.25 mg total) by mouth 2 (two) times daily with a meal. 12/14/21 01/13/22  Darliss Cheney, MD  clopidogrel (PLAVIX) 75 MG tablet Take 1 tablet (75 mg total) by mouth daily. 02/06/21   Hosie Poisson, MD  docusate sodium (COLACE) 100 MG capsule Take 1 capsule (100 mg total) by mouth 2 (two) times daily. 11/28/21 12/28/21  Thurnell Lose, MD  ferrous sulfate 325 (65 FE) MG tablet Take 1 tablet (325 mg total) by mouth 2 (two) times daily with a meal. 11/28/21   Thurnell Lose, MD  finasteride (PROSCAR) 5 MG tablet Take 1 tablet (5 mg total) by mouth daily. 11/21/21   Lavina Hamman, MD  furosemide (LASIX) 80 MG tablet Take 1 tablet (80 mg total) by mouth daily. 11/28/21   Thurnell Lose, MD  isosorbide-hydrALAZINE (BIDIL) 20-37.5 MG tablet Take 1 tablet by mouth 3 (three) times daily. 07/04/21   Isaiah Serge, NP  JARDIANCE 10 MG TABS tablet Take 10 mg by mouth daily. 11/10/21   [provider]  metFORMIN (GLUCOPHAGE) 500 MG tablet Take 500 mg by mouth in the morning and at bedtime. 06/10/21   [provider]  nitroGLYCERIN (NITROSTAT) 0.4 MG SL tablet Place 1 tablet (0.4 mg total) under the tongue every 5 (five) minutes x 3 doses as needed for chest pain. 02/05/21   Hosie Poisson, MD  pantoprazole (PROTONIX) 40 MG tablet Take 1 tablet (40 mg total) by mouth 2 (two) times daily before a meal. 11/28/21   Thurnell Lose, MD  tamsulosin (FLOMAX) 0.4 MG CAPS capsule Take 2 capsules (0.8 mg total) by mouth  daily. 11/28/21   Thurnell Lose, MD  vitamin B-12 (CYANOCOBALAMIN) 500 MCG tablet Take 1 tablet (500 mcg total) by mouth daily. 11/21/21   Lavina Hamman, MD    Allergies    Patient has no known allergies.  Review of Systems   Review of Systems  Constitutional:  Negative for activity change, chills, fatigue and fever.  HENT:  Negative for congestion, ear pain and sore throat.   Eyes:  Negative for pain and visual disturbance.  Respiratory:  Negative for cough, chest tightness, shortness of breath and wheezing.   Cardiovascular:  Negative for chest pain and palpitations.  Gastrointestinal:  Positive for abdominal pain, constipation and rectal pain. Negative for nausea and vomiting.  Genitourinary:  Negative for dysuria, flank pain and hematuria.  Musculoskeletal:  Positive for arthralgias (Right foot and ankle). Negative for back pain, myalgias and neck pain.  Skin:  Negative for color change, rash and wound.  Neurological:  Negative for dizziness, seizures, syncope, weakness, numbness and headaches.  Psychiatric/Behavioral:  Negative for confusion and decreased concentration.   All other systems reviewed and are negative.  Physical Exam Updated Vital Signs BP 135/69    Pulse 77    Temp 98.2 F (36.8 C) (Oral)    Resp 16    SpO2 100%   Physical Exam Vitals and nursing note reviewed. Exam conducted with a chaperone present.  Constitutional:      General: He is not in acute distress.    Appearance: He is well-developed and normal weight. He is not ill-appearing, toxic-appearing or diaphoretic.  HENT:     Head: Normocephalic and atraumatic.     Mouth/Throat:     Mouth: Mucous membranes are moist.     Pharynx: Oropharynx is clear.  Eyes:     General: No scleral icterus.    Extraocular Movements: Extraocular movements intact.     Conjunctiva/sclera: Conjunctivae normal.  Cardiovascular:     Rate and Rhythm: Normal rate and regular rhythm.     Heart sounds: No murmur  heard. Pulmonary:     Effort: Pulmonary effort is normal. No respiratory distress.     Breath sounds: Normal breath sounds. No wheezing or rales.  Abdominal:     General: Abdomen is flat.     Palpations: Abdomen is soft.     Tenderness: There is no abdominal tenderness.  Genitourinary:    Rectum: No anal fissure, external hemorrhoid or internal hemorrhoid. Normal anal tone.     Comments: No gross blood or melena on DRE  Musculoskeletal:        General: No swelling.     Cervical back: Neck supple.     Right ankle: Swelling present. Tenderness present.     Right foot: Swelling and tenderness present.  Skin:    General: Skin is warm and dry.     Capillary Refill: Capillary refill takes less than 2 seconds.  Neurological:     General: No focal deficit present.     Mental Status: He is alert.     Cranial Nerves: No cranial nerve deficit.     Motor: No weakness.  Psychiatric:        Mood and Affect: Mood normal.        Behavior: Behavior normal.    ED Results / Procedures / Treatments   Labs (all labs ordered are listed, but only abnormal results are displayed) Labs Reviewed  LIPASE, BLOOD - Abnormal; Notable for the following components:      Result Value   Lipase 68 (*)    All other components within normal limits  COMPREHENSIVE METABOLIC PANEL - Abnormal; Notable for the following components:   CO2 21 (*)    Glucose, Bld 152 (*)    BUN 31 (*)    Creatinine, Ser 1.45 (*)    Calcium 8.3 (*)    GFR, Estimated 48 (*)    All other components within normal limits  CBC - Abnormal; Notable for the following components:   WBC 16.1 (*)    Hemoglobin 11.5 (*)    MCH 23.1 (*)    MCHC 28.7 (*)    RDW 23.6 (*)    Platelets 677 (*)    All other components within normal limits  SEDIMENTATION RATE - Abnormal; Notable for the following components:   Sed Rate 32 (*)    All other components within normal limits  C-REACTIVE PROTEIN -  Abnormal; Notable for the following components:    CRP 1.9 (*)    All other components within normal limits  URINALYSIS, ROUTINE W REFLEX MICROSCOPIC    EKG None  Radiology CT ABDOMEN PELVIS WO CONTRAST  Result Date: 12/19/2021 CLINICAL DATA:  Abdominal pain and constipation. EXAM: CT ABDOMEN AND PELVIS WITHOUT CONTRAST TECHNIQUE: Multidetector CT imaging of the abdomen and pelvis was performed following the standard protocol without IV contrast. COMPARISON:  September 20, 2021 FINDINGS: Lower chest: Moderate-severity atelectatic changes are seen within the bilateral lung bases. Small bilateral pleural effusions are noted. Hepatobiliary: Subcentimeter calcified granulomas are seen within the right and left lobes of the liver. No gallstones, gallbladder wall thickening, or biliary dilatation. Pancreas: Unremarkable. No pancreatic ductal dilatation or surrounding inflammatory changes. Spleen: Normal in size without focal abnormality. Adrenals/Urinary Tract: Adrenal glands are unremarkable. Kidneys are normal, without renal calculi, focal lesion, or hydronephrosis. Very mild, diffuse urinary bladder wall thickening is seen. Stomach/Bowel: Stomach is within normal limits. Appendix appears normal. Stool is seen throughout the large bowel. No evidence of bowel wall thickening, distention, or inflammatory changes. Vascular/Lymphatic: Aortic atherosclerosis. No enlarged abdominal or pelvic lymph nodes. Reproductive: The prostate gland is markedly enlarged. Other: No abdominal wall hernia or abnormality. No abdominopelvic ascites. Musculoskeletal: There is grade 1 anterolisthesis of the L5 vertebral body on S1. Chronic bilateral pars defects are also seen at the level of L5-S1. Multilevel degenerative changes are noted throughout the remainder of the lumbar spine IMPRESSION: 1. Large stool burden, without evidence of bowel obstruction. 2. Moderate severity bibasilar atelectasis with small bilateral pleural effusions. 3. 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 4. Grade 1 anterolisthesis of the L5 vertebral body on S1. 5. Marked severity prostatomegaly. 6. Aortic atherosclerosis. Aortic Atherosclerosis (ICD10-I70.0). Electronically Signed   By: Virgina Norfolk M.D.   On: 12/19/2021 02:01   DG Ankle Complete Right  Result Date: 12/18/2021 CLINICAL DATA:  Right lower extremity pain. EXAM: RIGHT ANKLE - COMPLETE 3+ VIEW; RIGHT FOOT - 2 VIEW COMPARISON:  None. FINDINGS: No acute fracture or dislocation. The bones are well mineralized. Mild degenerative changes of the midfoot involving the tarsal bone. There is diffuse soft tissue swelling primarily involving the dorsum of the foot. No radiopaque foreign object or soft tissue gas. IMPRESSION: 1. No acute osseous pathology. 2. Diffuse soft tissue swelling of the foot. Electronically Signed   By: Anner Crete M.D.   On: 12/18/2021 22:47   DG Abdomen 1 View  Result Date: 12/18/2021 CLINICAL DATA:  Abdominal pain and constipation. EXAM: ABDOMEN - 1 VIEW COMPARISON:  CT scan 09/20/2021 FINDINGS: Moderate stool burden. No findings for small bowel obstruction or free air. The soft tissue shadows are maintained. No worrisome calcifications. The bony structures are unremarkable. IMPRESSION: Moderate stool burden. Electronically Signed   By: Marijo Sanes M.D.   On: 12/18/2021 18:06   CT Foot Right Wo Contrast  Result Date: 12/19/2021 CLINICAL DATA:  Right foot swelling. Concern for acute osteomyelitis. EXAM: CT OF THE RIGHT FOOT WITHOUT CONTRAST TECHNIQUE: Multidetector CT imaging of the right foot was performed according to the standard protocol. Multiplanar CT image reconstructions were also generated. COMPARISON:  Right foot radiograph dated 12/18/2021. FINDINGS: Bones/Joint/Cartilage No acute fracture or dislocation. Mild spurring of the dorsum of the midfoot. No bone erosion or periosteal elevation. Ligaments Suboptimally assessed by CT. Muscles and Tendons No acute intramuscular fluid collection.  Soft tissues There is diffuse subcutaneous edema predominantly involving the dorsum of the  right foot. No soft tissue gas. No drainable fluid collection or abscess. IMPRESSION: 1. No acute fracture or dislocation. No evidence of acute osteomyelitis by CT. 2. Diffuse subcutaneous edema predominantly involving the dorsum of the right foot. No drainable fluid collection or abscess. Electronically Signed   By: Anner Crete M.D.   On: 12/19/2021 01:57   DG Foot 2 Views Right  Result Date: 12/18/2021 CLINICAL DATA:  Right lower extremity pain. EXAM: RIGHT ANKLE - COMPLETE 3+ VIEW; RIGHT FOOT - 2 VIEW COMPARISON:  None. FINDINGS: No acute fracture or dislocation. The bones are well mineralized. Mild degenerative changes of the midfoot involving the tarsal bone. There is diffuse soft tissue swelling primarily involving the dorsum of the foot. No radiopaque foreign object or soft tissue gas. IMPRESSION: 1. No acute osseous pathology. 2. Diffuse soft tissue swelling of the foot. Electronically Signed   By: Anner Crete M.D.   On: 12/18/2021 22:47   DG Hip Unilat W or Wo Pelvis 2-3 Views Right  Result Date: 12/18/2021 CLINICAL DATA:  Right-sided hip pain EXAM: DG HIP (WITH OR WITHOUT PELVIS) 2-3V RIGHT COMPARISON:  None. FINDINGS: SI joints are non widened. Pubic symphysis and rami appear intact. No fracture or malalignment. The joint space is patent. IMPRESSION: Negative. Electronically Signed   By: Donavan Foil M.D.   On: 12/18/2021 23:20    Procedures Procedures   Medications Ordered in ED Medications  ceFAZolin (ANCEF) IVPB 1 g/50 mL premix (0 g Intravenous Stopped 12/19/21 0057)  lactated ringers bolus 250 mL (0 mLs Intravenous Stopped 12/19/21 0219)  loperamide (IMODIUM) capsule 4 mg (4 mg Oral Given 12/19/21 0224)    ED Course  I have reviewed the triage vital signs and the nursing notes.  Pertinent labs & imaging results that were available during my care of the patient were reviewed  by me and considered in my medical decision making (see chart for details).    MDM Rules/Calculators/A&P                         Patient is 80 year old male who presents for multiple complaints.  He initially endorsed abdominal pain and concerns of constipation.  On my initial assessment, he endorsed concerns about his painful right foot and ankle.  Vital signs are normal upon arrival in the ED.  On exam, he does have swelling that is most prominent on the dorsum of his foot.  Area is tender.  Swelling and tenderness extend throughout foot and ankle.  On DRE, patient has normal rectal tone, no evidence of masses or hemorrhoids.  There is no gross blood or melena.  There is no impacted stool in rectal vault.  Prior to being bedded in the ED, basic labs were obtained.  Results were notable for leukocytosis.  Given this as well as his multiple complaints, additional labs and CT imaging were ordered.  Ancef is ordered for empiric treatment of right foot cellulitis.  Care of patient was signed out to oncoming ED provider.  Final Clinical Impression(s) / ED Diagnoses Final diagnoses:  Cellulitis of right lower extremity    Rx / DC Orders ED Discharge Orders          Ordered    cefUROXime (CEFTIN) 500 MG tablet  2 times daily with meals        12/19/21 0217             Godfrey Pick, MD 12/19/21 [phone]

## 2021-12-18 NOTE — ED Triage Notes (Signed)
Pt BIB EMS from home. Pt c/o abdominal pain and constipation x3 days. 150/80 80 HR Resp 16 CBG 223

## 2021-12-19 ENCOUNTER — Emergency Department (HOSPITAL_COMMUNITY): Payer: Medicare Other

## 2021-12-19 DIAGNOSIS — L03115 Cellulitis of right lower limb: Secondary | ICD-10-CM | POA: Diagnosis not present

## 2021-12-19 LAB — C-REACTIVE PROTEIN: CRP: 1.9 mg/dL — ABNORMAL HIGH (ref <1.0)

## 2021-12-19 MED ORDER — CEFAZOLIN SODIUM-DEXTROSE 1-4 GM/50ML-% IV SOLN
1.0000 g | Freq: Once | INTRAVENOUS | Status: AC
  Administered 2021-12-19: 1 g via INTRAVENOUS
  Filled 2021-12-19: qty 50

## 2021-12-19 MED ORDER — LACTATED RINGERS IV BOLUS: 500.0000 mL | Freq: Once | INTRAVENOUS | Status: DC

## 2021-12-19 MED ORDER — LACTATED RINGERS IV BOLUS
250.0000 mL | Freq: Once | INTRAVENOUS | Status: AC
  Administered 2021-12-19: 250 mL via INTRAVENOUS

## 2021-12-19 MED ORDER — LOPERAMIDE HCL 2 MG PO CAPS
4.0000 mg | ORAL_CAPSULE | Freq: Once | ORAL | Status: AC
  Administered 2021-12-19: 02:00:00 4 mg via ORAL
  Filled 2021-12-19: qty 2

## 2021-12-19 MED ORDER — CEFUROXIME AXETIL 500 MG PO TABS: 500.0000 mg | ORAL_TABLET | Freq: Two times a day (BID) | ORAL | 0 refills | Status: DC

## 2021-12-19 NOTE — ED Notes (Signed)
Voicemail left with transportation services to arrange transport home for patient via safe transport. Awaiting returned call from transportation services to arrange.

## 2021-12-19 NOTE — ED Notes (Signed)
Safe transport was contacted again without success. Daughter was contacted without success.

## 2021-12-19 NOTE — ED Notes (Signed)
Call returned and safe transport arranged for patient.

## 2021-12-19 NOTE — ED Notes (Signed)
Safe transport was called for this patient, however safe transport did not respond. Patients son was called to inform him of the patients discharge. Son did not answer the phone.

## 2021-12-19 NOTE — ED Provider Notes (Signed)
Nursing notes and vitals signs, including pulse oximetry, reviewed.  Summary of this visit's results, reviewed by myself:  EKG:  EKG Interpretation  Date/Time:    Ventricular Rate:    PR Interval:    QRS Duration:   QT Interval:    QTC Calculation:   R Axis:     Text Interpretation:          Labs:  Results for orders placed or performed during the hospital encounter of 12/18/21 (from the past 24 hour(s))  Lipase, blood     Status: Abnormal   Collection Time: 12/18/21  5:29 PM  Result Value Ref Range   Lipase 68 (H) 11 - 51 U/L  Comprehensive metabolic panel     Status: Abnormal   Collection Time: 12/18/21  5:29 PM  Result Value Ref Range   Sodium 139 135 - 145 mmol/L   Potassium 3.6 3.5 - 5.1 mmol/L   Chloride 108 98 - 111 mmol/L   CO2 21 (L) 22 - 32 mmol/L   Glucose, Bld 152 (H) 70 - 99 mg/dL   BUN 31 (H) 8 - 23 mg/dL   Creatinine, Ser 1.45 (H) 0.61 - 1.24 mg/dL   Calcium 8.3 (L) 8.9 - 10.3 mg/dL   Total Protein 7.8 6.5 - 8.1 g/dL   Albumin 3.9 3.5 - 5.0 g/dL   AST 37 15 - 41 U/L   ALT 32 0 - 44 U/L   Alkaline Phosphatase 74 38 - 126 U/L   Total Bilirubin 0.5 0.3 - 1.2 mg/dL   GFR, Estimated 48 (L) >60 mL/min   Anion gap 10 5 - 15  CBC     Status: Abnormal   Collection Time: 12/18/21  5:29 PM  Result Value Ref Range   WBC 16.1 (H) 4.0 - 10.5 K/uL   RBC 4.98 4.22 - 5.81 MIL/uL   Hemoglobin 11.5 (L) 13.0 - 17.0 g/dL   HCT 40.1 39.0 - 52.0 %   MCV 80.5 80.0 - 100.0 fL   MCH 23.1 (L) 26.0 - 34.0 pg   MCHC 28.7 (L) 30.0 - 36.0 g/dL   RDW 23.6 (H) 11.5 - 15.5 %   Platelets 677 (H) 150 - 400 K/uL   nRBC 0.0 0.0 - 0.2 %  Sedimentation rate     Status: Abnormal   Collection Time: 12/18/21 10:49 PM  Result Value Ref Range   Sed Rate 32 (H) 0 - 16 mm/hr  C-reactive protein     Status: Abnormal   Collection Time: 12/18/21 10:49 PM  Result Value Ref Range   CRP 1.9 (H) <1.0 mg/dL    Imaging Studies: CT ABDOMEN PELVIS WO CONTRAST  Result Date:  12/19/2021 CLINICAL DATA:  Abdominal pain and constipation. EXAM: CT ABDOMEN AND PELVIS WITHOUT CONTRAST TECHNIQUE: Multidetector CT imaging of the abdomen and pelvis was performed following the standard protocol without IV contrast. COMPARISON:  September 20, 2021 FINDINGS: Lower chest: Moderate-severity atelectatic changes are seen within the bilateral lung bases. Small bilateral pleural effusions are noted. Hepatobiliary: Subcentimeter calcified granulomas are seen within the right and left lobes of the liver. No gallstones, gallbladder wall thickening, or biliary dilatation. Pancreas: Unremarkable. No pancreatic ductal dilatation or surrounding inflammatory changes. Spleen: Normal in size without focal abnormality. Adrenals/Urinary Tract: Adrenal glands are unremarkable. Kidneys are normal, without renal calculi, focal lesion, or hydronephrosis. Very mild, diffuse urinary bladder wall thickening is seen. Stomach/Bowel: Stomach is within normal limits. Appendix appears normal. Stool is seen throughout the large bowel. No evidence of  bowel wall thickening, distention, or inflammatory changes. Vascular/Lymphatic: Aortic atherosclerosis. No enlarged abdominal or pelvic lymph nodes. Reproductive: The prostate gland is markedly enlarged. Other: No abdominal wall hernia or abnormality. No abdominopelvic ascites. Musculoskeletal: There is grade 1 anterolisthesis of the L5 vertebral body on S1. Chronic bilateral pars defects are also seen at the level of L5-S1. Multilevel degenerative changes are noted throughout the remainder of the lumbar spine IMPRESSION: 1. Large stool burden, without evidence of bowel obstruction. 2. Moderate severity bibasilar atelectasis with small bilateral pleural effusions. 3. 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 4. Grade 1 anterolisthesis of the L5 vertebral body on S1. 5. Marked severity prostatomegaly. 6. Aortic atherosclerosis. Aortic Atherosclerosis (ICD10-I70.0).  Electronically Signed   By: Virgina Norfolk M.D.   On: 12/19/2021 02:01   DG Ankle Complete Right  Result Date: 12/18/2021 CLINICAL DATA:  Right lower extremity pain. EXAM: RIGHT ANKLE - COMPLETE 3+ VIEW; RIGHT FOOT - 2 VIEW COMPARISON:  None. FINDINGS: No acute fracture or dislocation. The bones are well mineralized. Mild degenerative changes of the midfoot involving the tarsal bone. There is diffuse soft tissue swelling primarily involving the dorsum of the foot. No radiopaque foreign object or soft tissue gas. IMPRESSION: 1. No acute osseous pathology. 2. Diffuse soft tissue swelling of the foot. Electronically Signed   By: Anner Crete M.D.   On: 12/18/2021 22:47   DG Abdomen 1 View  Result Date: 12/18/2021 CLINICAL DATA:  Abdominal pain and constipation. EXAM: ABDOMEN - 1 VIEW COMPARISON:  CT scan 09/20/2021 FINDINGS: Moderate stool burden. No findings for small bowel obstruction or free air. The soft tissue shadows are maintained. No worrisome calcifications. The bony structures are unremarkable. IMPRESSION: Moderate stool burden. Electronically Signed   By: Marijo Sanes M.D.   On: 12/18/2021 18:06   CT Foot Right Wo Contrast  Result Date: 12/19/2021 CLINICAL DATA:  Right foot swelling. Concern for acute osteomyelitis. EXAM: CT OF THE RIGHT FOOT WITHOUT CONTRAST TECHNIQUE: Multidetector CT imaging of the right foot was performed according to the standard protocol. Multiplanar CT image reconstructions were also generated. COMPARISON:  Right foot radiograph dated 12/18/2021. FINDINGS: Bones/Joint/Cartilage No acute fracture or dislocation. Mild spurring of the dorsum of the midfoot. No bone erosion or periosteal elevation. Ligaments Suboptimally assessed by CT. Muscles and Tendons No acute intramuscular fluid collection. Soft tissues There is diffuse subcutaneous edema predominantly involving the dorsum of the right foot. No soft tissue gas. No drainable fluid collection or abscess.  IMPRESSION: 1. No acute fracture or dislocation. No evidence of acute osteomyelitis by CT. 2. Diffuse subcutaneous edema predominantly involving the dorsum of the right foot. No drainable fluid collection or abscess. Electronically Signed   By: Anner Crete M.D.   On: 12/19/2021 01:57   DG Foot 2 Views Right  Result Date: 12/18/2021 CLINICAL DATA:  Right lower extremity pain. EXAM: RIGHT ANKLE - COMPLETE 3+ VIEW; RIGHT FOOT - 2 VIEW COMPARISON:  None. FINDINGS: No acute fracture or dislocation. The bones are well mineralized. Mild degenerative changes of the midfoot involving the tarsal bone. There is diffuse soft tissue swelling primarily involving the dorsum of the foot. No radiopaque foreign object or soft tissue gas. IMPRESSION: 1. No acute osseous pathology. 2. Diffuse soft tissue swelling of the foot. Electronically Signed   By: Anner Crete M.D.   On: 12/18/2021 22:47   DG Hip Unilat W or Wo Pelvis 2-3 Views Right  Result Date: 12/18/2021 CLINICAL DATA:  Right-sided hip pain  EXAM: DG HIP (WITH OR WITHOUT PELVIS) 2-3V RIGHT COMPARISON:  None. FINDINGS: SI joints are non widened. Pubic symphysis and rami appear intact. No fracture or malalignment. The joint space is patent. IMPRESSION: Negative. Electronically Signed   By: Donavan Foil M.D.   On: 12/18/2021 23:20    2:14 AM Patient already given Ancef IV in the ED.  No evidence of gas in soft tissue.  Patient's vital signs are normal.  We will treat as an outpatient for cellulitis.     Dalene Robards, Jenny Reichmann, MD 12/19/21 [phone]

## 2021-12-19 NOTE — ED Notes (Signed)
Attempted to call patient's family members for transport home. No answer. AC called for cab voucher.

## 2021-12-30 ENCOUNTER — Inpatient Hospital Stay (HOSPITAL_COMMUNITY)
Admission: EM | Admit: 2021-12-30 | Discharge: 2022-01-10 | DRG: 291 | Disposition: A | Discharge status: Skilled Nursing Facility | Payer: Medicare Other | Attending: Internal Medicine | Admitting: Internal Medicine

## 2021-12-30 ENCOUNTER — Emergency Department (HOSPITAL_COMMUNITY): Payer: Medicare Other

## 2021-12-30 DIAGNOSIS — I272 Pulmonary hypertension, unspecified: Secondary | ICD-10-CM | POA: Diagnosis present

## 2021-12-30 DIAGNOSIS — E1165 Type 2 diabetes mellitus with hyperglycemia: Secondary | ICD-10-CM | POA: Diagnosis present

## 2021-12-30 DIAGNOSIS — I255 Ischemic cardiomyopathy: Secondary | ICD-10-CM | POA: Diagnosis present

## 2021-12-30 DIAGNOSIS — Z79890 Hormone replacement therapy (postmenopausal): Secondary | ICD-10-CM

## 2021-12-30 DIAGNOSIS — N1832 Chronic kidney disease, stage 3b: Secondary | ICD-10-CM | POA: Diagnosis present

## 2021-12-30 DIAGNOSIS — E440 Moderate protein-calorie malnutrition: Secondary | ICD-10-CM | POA: Insufficient documentation

## 2021-12-30 DIAGNOSIS — Z20822 Contact with and (suspected) exposure to covid-19: Secondary | ICD-10-CM | POA: Diagnosis present

## 2021-12-30 DIAGNOSIS — Z6822 Body mass index (BMI) 22.0-22.9, adult: Secondary | ICD-10-CM

## 2021-12-30 DIAGNOSIS — Z7984 Long term (current) use of oral hypoglycemic drugs: Secondary | ICD-10-CM

## 2021-12-30 DIAGNOSIS — M25571 Pain in right ankle and joints of right foot: Secondary | ICD-10-CM | POA: Diagnosis not present

## 2021-12-30 DIAGNOSIS — I252 Old myocardial infarction: Secondary | ICD-10-CM

## 2021-12-30 DIAGNOSIS — R739 Hyperglycemia, unspecified: Secondary | ICD-10-CM | POA: Diagnosis present

## 2021-12-30 DIAGNOSIS — E782 Mixed hyperlipidemia: Secondary | ICD-10-CM | POA: Diagnosis present

## 2021-12-30 DIAGNOSIS — R338 Other retention of urine: Secondary | ICD-10-CM | POA: Diagnosis present

## 2021-12-30 DIAGNOSIS — I130 Hypertensive heart and chronic kidney disease with heart failure and stage 1 through stage 4 chronic kidney disease, or unspecified chronic kidney disease: Principal | ICD-10-CM | POA: Diagnosis present

## 2021-12-30 DIAGNOSIS — E876 Hypokalemia: Secondary | ICD-10-CM | POA: Diagnosis present

## 2021-12-30 DIAGNOSIS — I5022 Chronic systolic (congestive) heart failure: Secondary | ICD-10-CM | POA: Diagnosis present

## 2021-12-30 DIAGNOSIS — R339 Retention of urine, unspecified: Secondary | ICD-10-CM | POA: Diagnosis present

## 2021-12-30 DIAGNOSIS — Z9114 Patient's other noncompliance with medication regimen: Secondary | ICD-10-CM

## 2021-12-30 DIAGNOSIS — F03911 Unspecified dementia, unspecified severity, with agitation: Secondary | ICD-10-CM | POA: Diagnosis present

## 2021-12-30 DIAGNOSIS — Z66 Do not resuscitate: Secondary | ICD-10-CM | POA: Diagnosis present

## 2021-12-30 DIAGNOSIS — I5043 Acute on chronic combined systolic (congestive) and diastolic (congestive) heart failure: Secondary | ICD-10-CM | POA: Diagnosis not present

## 2021-12-30 DIAGNOSIS — Z603 Acculturation difficulty: Secondary | ICD-10-CM | POA: Diagnosis present

## 2021-12-30 DIAGNOSIS — I509 Heart failure, unspecified: Secondary | ICD-10-CM

## 2021-12-30 DIAGNOSIS — I5023 Acute on chronic systolic (congestive) heart failure: Secondary | ICD-10-CM | POA: Diagnosis present

## 2021-12-30 DIAGNOSIS — Z7982 Long term (current) use of aspirin: Secondary | ICD-10-CM

## 2021-12-30 DIAGNOSIS — R4189 Other symptoms and signs involving cognitive functions and awareness: Secondary | ICD-10-CM | POA: Diagnosis present

## 2021-12-30 DIAGNOSIS — N179 Acute kidney failure, unspecified: Secondary | ICD-10-CM | POA: Diagnosis present

## 2021-12-30 DIAGNOSIS — E871 Hypo-osmolality and hyponatremia: Secondary | ICD-10-CM | POA: Diagnosis present

## 2021-12-30 DIAGNOSIS — I248 Other forms of acute ischemic heart disease: Secondary | ICD-10-CM | POA: Diagnosis present

## 2021-12-30 DIAGNOSIS — N401 Enlarged prostate with lower urinary tract symptoms: Secondary | ICD-10-CM | POA: Diagnosis present

## 2021-12-30 DIAGNOSIS — I2510 Atherosclerotic heart disease of native coronary artery without angina pectoris: Secondary | ICD-10-CM | POA: Diagnosis present

## 2021-12-30 DIAGNOSIS — Z79899 Other long term (current) drug therapy: Secondary | ICD-10-CM

## 2021-12-30 DIAGNOSIS — I959 Hypotension, unspecified: Secondary | ICD-10-CM | POA: Diagnosis not present

## 2021-12-30 DIAGNOSIS — I5041 Acute combined systolic (congestive) and diastolic (congestive) heart failure: Secondary | ICD-10-CM

## 2021-12-30 DIAGNOSIS — R197 Diarrhea, unspecified: Secondary | ICD-10-CM | POA: Diagnosis present

## 2021-12-30 DIAGNOSIS — E1122 Type 2 diabetes mellitus with diabetic chronic kidney disease: Secondary | ICD-10-CM | POA: Diagnosis present

## 2021-12-30 LAB — CBC WITH DIFFERENTIAL/PLATELET
Abs Immature Granulocytes: 0.03 10*3/uL (ref 0.00–0.07)
Basophils Absolute: 0.1 10*3/uL (ref 0.0–0.1)
Basophils Relative: 1 %
Eosinophils Absolute: 0.1 10*3/uL (ref 0.0–0.5)
Eosinophils Relative: 1 %
HCT: 41.7 % (ref 39.0–52.0)
Hemoglobin: 11.9 g/dL — ABNORMAL LOW (ref 13.0–17.0)
Immature Granulocytes: 0 %
Lymphocytes Relative: 12 %
Lymphs Abs: 1.2 10*3/uL (ref 0.7–4.0)
MCH: 23.2 pg — ABNORMAL LOW (ref 26.0–34.0)
MCHC: 28.5 g/dL — ABNORMAL LOW (ref 30.0–36.0)
MCV: 81.1 fL (ref 80.0–100.0)
Monocytes Absolute: 1.1 10*3/uL — ABNORMAL HIGH (ref 0.1–1.0)
Monocytes Relative: 11 %
Neutro Abs: 7.7 10*3/uL (ref 1.7–7.7)
Neutrophils Relative %: 75 %
Platelets: 654 10*3/uL — ABNORMAL HIGH (ref 150–400)
RBC: 5.14 MIL/uL (ref 4.22–5.81)
RDW: 24.8 % — ABNORMAL HIGH (ref 11.5–15.5)
WBC: 10.1 10*3/uL (ref 4.0–10.5)
nRBC: 0 % (ref 0.0–0.2)

## 2021-12-30 LAB — COMPREHENSIVE METABOLIC PANEL
ALT: 77 U/L — ABNORMAL HIGH (ref 0–44)
AST: 82 U/L — ABNORMAL HIGH (ref 15–41)
Albumin: 3.6 g/dL (ref 3.5–5.0)
Alkaline Phosphatase: 125 U/L (ref 38–126)
Anion gap: 12 (ref 5–15)
BUN: 29 mg/dL — ABNORMAL HIGH (ref 8–23)
CO2: 17 mmol/L — ABNORMAL LOW (ref 22–32)
Calcium: 8.9 mg/dL (ref 8.9–10.3)
Chloride: 109 mmol/L (ref 98–111)
Creatinine, Ser: 1.91 mg/dL — ABNORMAL HIGH (ref 0.61–1.24)
GFR, Estimated: 35 mL/min — ABNORMAL LOW (ref >60)
Glucose, Bld: 118 mg/dL — ABNORMAL HIGH (ref 70–99)
Potassium: 4.6 mmol/L (ref 3.5–5.1)
Sodium: 138 mmol/L (ref 135–145)
Total Bilirubin: 0.7 mg/dL (ref 0.3–1.2)
Total Protein: 7.2 g/dL (ref 6.5–8.1)

## 2021-12-30 LAB — TROPONIN I (HIGH SENSITIVITY): Troponin I (High Sensitivity): 74 ng/L — ABNORMAL HIGH (ref <18)

## 2021-12-30 NOTE — ED Notes (Signed)
X2 vitals with no response

## 2021-12-30 NOTE — ED Triage Notes (Signed)
Patient with history of asthma and diabetes BIB [facility] from home or evaluation for shortness of breath x1 day. EMS reports frequently being called to the patient's home for the same complaint, he always says he has been short of breath for one day. Patient has 20g saline lock in left AC.  BP 146/80 HR 70

## 2021-12-30 NOTE — ED Notes (Signed)
Pt had a large amount of green watery diarrhea while in the waiting room.

## 2021-12-30 NOTE — ED Provider Triage Note (Signed)
Emergency Medicine Provider Triage Evaluation Note  Randy [name] , a 81 y.o. male  was evaluated in triage.  Pt complains of chest pain, shortness of breath, and pain with urination.  Patient reports that chest pain and shortness of breath have been present over the last 2 days.  Symptoms have been constant over this time.  Patient also complains of pain with urination and urinary urgency.  States that the symptoms have been bothering for a while  Review of Systems  Positive: Dysuria, urinary urgency, chest pain, shortness of breath Negative: Fever, chills, cough, hematuria,  Physical Exam  BP (!) 144/73 (BP Location: Left Arm)    Pulse 74    Temp 97.6 F (36.4 C) (Oral)    Resp 17    SpO2 99%  Gen:   Awake, no distress   Resp:  Normal effort, lungs clear to auscultation bilaterally MSK:   Moves extremities without difficulty  Other:  Abdomen soft, nondistended, nontender.  Medical Decision Making  Medically screening exam initiated at 8:41 PM.  Appropriate orders placed.  Randy [name] was informed that the remainder of the evaluation will be completed by another provider, this initial triage assessment does not replace that evaluation, and the importance of remaining in the ED until their evaluation is complete.  Noemi Chapel, [place] 12/30/21 2042

## 2021-12-30 NOTE — ED Notes (Signed)
Pt called 3x no answer  

## 2021-12-31 ENCOUNTER — Other Ambulatory Visit: Payer: Self-pay

## 2021-12-31 ENCOUNTER — Encounter (HOSPITAL_COMMUNITY): Payer: Self-pay | Admitting: Internal Medicine

## 2021-12-31 ENCOUNTER — Observation Stay (HOSPITAL_COMMUNITY): Payer: Medicare Other

## 2021-12-31 DIAGNOSIS — I5022 Chronic systolic (congestive) heart failure: Secondary | ICD-10-CM | POA: Diagnosis present

## 2021-12-31 DIAGNOSIS — R197 Diarrhea, unspecified: Secondary | ICD-10-CM | POA: Diagnosis present

## 2021-12-31 DIAGNOSIS — I5023 Acute on chronic systolic (congestive) heart failure: Secondary | ICD-10-CM | POA: Diagnosis present

## 2021-12-31 LAB — URINALYSIS, ROUTINE W REFLEX MICROSCOPIC
Bilirubin Urine: NEGATIVE
Glucose, UA: NEGATIVE mg/dL
Ketones, ur: NEGATIVE mg/dL
Leukocytes,Ua: NEGATIVE
Nitrite: NEGATIVE
Protein, ur: NEGATIVE mg/dL
Specific Gravity, Urine: 1.02 (ref 1.005–1.030)
pH: 5.5 (ref 5.0–8.0)

## 2021-12-31 LAB — BRAIN NATRIURETIC PEPTIDE: B Natriuretic Peptide: >4500 pg/mL — ABNORMAL HIGH (ref 0.0–100.0)

## 2021-12-31 LAB — TROPONIN I (HIGH SENSITIVITY): Troponin I (High Sensitivity): 76 ng/L — ABNORMAL HIGH (ref <18)

## 2021-12-31 LAB — URINALYSIS, MICROSCOPIC (REFLEX)
RBC / HPF: >50 RBC/hpf (ref 0–5)
Squamous Epithelial / HPF: NONE SEEN (ref 0–5)

## 2021-12-31 LAB — GLUCOSE, CAPILLARY: Glucose-Capillary: 139 mg/dL — ABNORMAL HIGH (ref 70–99)

## 2021-12-31 MED ORDER — BISACODYL 5 MG PO TBEC: 5.0000 mg | DELAYED_RELEASE_TABLET | Freq: Every day | ORAL | Status: DC | PRN

## 2021-12-31 MED ORDER — SODIUM CHLORIDE 0.9% FLUSH
3.0000 mL | Freq: Two times a day (BID) | INTRAVENOUS | Status: DC
  Administered 2021-12-31 – 2022-01-07 (×11): 3 mL via INTRAVENOUS

## 2021-12-31 MED ORDER — ACETAMINOPHEN 325 MG PO TABS
650.0000 mg | ORAL_TABLET | Freq: Four times a day (QID) | ORAL | Status: DC | PRN
  Administered 2022-01-02 – 2022-01-08 (×4): 650 mg via ORAL
  Filled 2021-12-31 (×5): qty 2

## 2021-12-31 MED ORDER — FUROSEMIDE 10 MG/ML IJ SOLN
80.0000 mg | Freq: Two times a day (BID) | INTRAMUSCULAR | Status: DC
  Administered 2021-12-31 – 2022-01-01 (×3): 80 mg via INTRAVENOUS
  Filled 2021-12-31 (×4): qty 8

## 2021-12-31 MED ORDER — ASPIRIN EC 81 MG PO TBEC
81.0000 mg | DELAYED_RELEASE_TABLET | Freq: Every day | ORAL | Status: DC
  Administered 2022-01-01 – 2022-01-10 (×7): 81 mg via ORAL
  Filled 2021-12-31 (×9): qty 1

## 2021-12-31 MED ORDER — ATORVASTATIN CALCIUM 80 MG PO TABS
80.0000 mg | ORAL_TABLET | Freq: Every day | ORAL | Status: DC
  Administered 2022-01-01 – 2022-01-10 (×7): 80 mg via ORAL
  Filled 2021-12-31 (×9): qty 1

## 2021-12-31 MED ORDER — POLYETHYLENE GLYCOL 3350 17 G PO PACK: 17.0000 g | PACK | Freq: Every day | ORAL | Status: DC | PRN

## 2021-12-31 MED ORDER — DOCUSATE SODIUM 100 MG PO CAPS
100.0000 mg | ORAL_CAPSULE | Freq: Two times a day (BID) | ORAL | Status: DC
  Administered 2022-01-01 – 2022-01-10 (×13): 100 mg via ORAL
  Filled 2021-12-31 (×19): qty 1

## 2021-12-31 MED ORDER — PANTOPRAZOLE SODIUM 40 MG PO TBEC
40.0000 mg | DELAYED_RELEASE_TABLET | Freq: Two times a day (BID) | ORAL | Status: DC
  Administered 2021-12-31 – 2022-01-03 (×6): 40 mg via ORAL
  Filled 2021-12-31 (×6): qty 1

## 2021-12-31 MED ORDER — TAMSULOSIN HCL 0.4 MG PO CAPS
0.8000 mg | ORAL_CAPSULE | Freq: Every day | ORAL | Status: DC
  Administered 2022-01-01 – 2022-01-10 (×7): 0.8 mg via ORAL
  Filled 2021-12-31 (×9): qty 2

## 2021-12-31 MED ORDER — ONDANSETRON HCL 4 MG/2ML IJ SOLN: 4.0000 mg | Freq: Four times a day (QID) | INTRAMUSCULAR | Status: DC | PRN

## 2021-12-31 MED ORDER — CLOPIDOGREL BISULFATE 75 MG PO TABS
75.0000 mg | ORAL_TABLET | Freq: Every day | ORAL | Status: DC
Start: 2022-01-01 — End: 2022-01-10
  Administered 2022-01-01 – 2022-01-10 (×7): 75 mg via ORAL
  Filled 2021-12-31 (×9): qty 1

## 2021-12-31 MED ORDER — MORPHINE SULFATE (PF) 2 MG/ML IV SOLN: 2.0000 mg | INTRAVENOUS | Status: DC | PRN

## 2021-12-31 MED ORDER — FINASTERIDE 5 MG PO TABS
5.0000 mg | ORAL_TABLET | Freq: Every day | ORAL | Status: DC
  Administered 2022-01-01 – 2022-01-10 (×7): 5 mg via ORAL
  Filled 2021-12-31 (×9): qty 1

## 2021-12-31 MED ORDER — TRAZODONE HCL 50 MG PO TABS
25.0000 mg | ORAL_TABLET | Freq: Every evening | ORAL | Status: DC | PRN
  Administered 2022-01-05: 25 mg via ORAL
  Filled 2021-12-31: qty 1

## 2021-12-31 MED ORDER — ONDANSETRON HCL 4 MG PO TABS: 4.0000 mg | ORAL_TABLET | Freq: Four times a day (QID) | ORAL | Status: DC | PRN

## 2021-12-31 MED ORDER — FUROSEMIDE 10 MG/ML IJ SOLN
120.0000 mg | Freq: Once | INTRAVENOUS | Status: AC
  Administered 2021-12-31: 120 mg via INTRAVENOUS
  Filled 2021-12-31: qty 10

## 2021-12-31 MED ORDER — CHLORHEXIDINE GLUCONATE CLOTH 2 % EX PADS
6.0000 | MEDICATED_PAD | Freq: Every day | CUTANEOUS | Status: DC
  Administered 2021-12-31 – 2022-01-01 (×2): 6 via TOPICAL

## 2021-12-31 MED ORDER — OXYCODONE HCL 5 MG PO TABS: 5.0000 mg | ORAL_TABLET | ORAL | Status: DC | PRN

## 2021-12-31 MED ORDER — HYDRALAZINE HCL 20 MG/ML IJ SOLN: 5.0000 mg | INTRAMUSCULAR | Status: DC | PRN

## 2021-12-31 MED ORDER — ENOXAPARIN SODIUM 30 MG/0.3ML IJ SOSY
30.0000 mg | PREFILLED_SYRINGE | INTRAMUSCULAR | Status: DC
  Administered 2021-12-31 – 2022-01-06 (×5): 30 mg via SUBCUTANEOUS
  Filled 2021-12-31 (×8): qty 0.3

## 2021-12-31 MED ORDER — ACETAMINOPHEN 650 MG RE SUPP: 650.0000 mg | Freq: Four times a day (QID) | RECTAL | Status: DC | PRN

## 2021-12-31 MED ORDER — INSULIN ASPART 100 UNIT/ML IJ SOLN
0.0000 [IU] | Freq: Three times a day (TID) | INTRAMUSCULAR | Status: DC
  Administered 2022-01-01 – 2022-01-02 (×4): 2 [IU] via SUBCUTANEOUS

## 2021-12-31 MED ORDER — CARVEDILOL 6.25 MG PO TABS
6.2500 mg | ORAL_TABLET | Freq: Two times a day (BID) | ORAL | Status: DC
  Administered 2022-01-01 – 2022-01-10 (×14): 6.25 mg via ORAL
  Filled 2021-12-31 (×17): qty 1

## 2021-12-31 MED ORDER — ISOSORB DINITRATE-HYDRALAZINE 20-37.5 MG PO TABS
1.0000 | ORAL_TABLET | Freq: Three times a day (TID) | ORAL | Status: DC
  Administered 2021-12-31 – 2022-01-05 (×11): 1 via ORAL
  Filled 2021-12-31 (×14): qty 1

## 2021-12-31 NOTE — ED Provider Notes (Signed)
[facility] EMERGENCY DEPARTMENT Provider Note   CSN: [id number] Arrival date & time: 12/30/21  1747     History  Chief Complaint  Patient presents with   Shortness of Breath    Randy [name] is a 81 y.o. male.  HPI Patient presents with concern for dyspnea for 1 day.  Patient has multiple medical issues.  History is obtained by the patient and EMS, and the patient's son who arrived later.  Patient's history includes diabetes, asthma, heart failure, all notable for presentation for dyspnea.  No report of fever, fall, vomiting, diarrhea, the patient smiles when asked if he is having any pain.    Home Medications Prior to Admission medications   Medication Sig Start Date End Date Taking? Authorizing Provider  acetaminophen (TYLENOL) 325 MG tablet Take 2 tablets (650 mg total) by mouth every 4 (four) hours as needed for headache or mild pain. 07/04/21   Isaiah Serge, NP  aspirin 81 MG EC tablet Take 1 tablet (81 mg total) by mouth daily. Swallow whole. 11/28/21   Thurnell Lose, MD  atorvastatin (LIPITOR) 80 MG tablet Take 1 tablet (80 mg total) by mouth daily. 02/06/21   Hosie Poisson, MD  carvedilol (COREG) 6.25 MG tablet Take 1 tablet (6.25 mg total) by mouth 2 (two) times daily with a meal. 12/14/21 01/13/22  Darliss Cheney, MD  cefUROXime (CEFTIN) 500 MG tablet Take 1 tablet (500 mg total) by mouth 2 (two) times daily with a meal. 12/19/21   Molpus, John, MD  clopidogrel (PLAVIX) 75 MG tablet Take 1 tablet (75 mg total) by mouth daily. 02/06/21   Hosie Poisson, MD  ferrous sulfate 325 (65 FE) MG tablet Take 1 tablet (325 mg total) by mouth 2 (two) times daily with a meal. 11/28/21   Thurnell Lose, MD  finasteride (PROSCAR) 5 MG tablet Take 1 tablet (5 mg total) by mouth daily. 11/21/21   Lavina Hamman, MD  furosemide (LASIX) 80 MG tablet Take 1 tablet (80 mg total) by mouth daily. 11/28/21   Thurnell Lose, MD  isosorbide-hydrALAZINE (BIDIL) 20-37.5 MG  tablet Take 1 tablet by mouth 3 (three) times daily. 07/04/21   Isaiah Serge, NP  JARDIANCE 10 MG TABS tablet Take 10 mg by mouth daily. 11/10/21   [provider]  metFORMIN (GLUCOPHAGE) 500 MG tablet Take 500 mg by mouth in the morning and at bedtime. 06/10/21   [provider]  nitroGLYCERIN (NITROSTAT) 0.4 MG SL tablet Place 1 tablet (0.4 mg total) under the tongue every 5 (five) minutes x 3 doses as needed for chest pain. 02/05/21   Hosie Poisson, MD  pantoprazole (PROTONIX) 40 MG tablet Take 1 tablet (40 mg total) by mouth 2 (two) times daily before a meal. 11/28/21   Thurnell Lose, MD  tamsulosin (FLOMAX) 0.4 MG CAPS capsule Take 2 capsules (0.8 mg total) by mouth daily. 11/28/21   Thurnell Lose, MD  vitamin B-12 (CYANOCOBALAMIN) 500 MCG tablet Take 1 tablet (500 mcg total) by mouth daily. 11/21/21   Lavina Hamman, MD      Allergies    Patient has no known allergies.    Review of Systems   Review of Systems  Constitutional:        Per HPI, otherwise negative  HENT:         Per HPI, otherwise negative  Respiratory:         Per HPI, otherwise negative  Cardiovascular:  Per HPI, otherwise negative  Gastrointestinal:  Negative for vomiting.  Endocrine:       Negative aside from HPI  Genitourinary:        Neg aside from HPI   Musculoskeletal:        Per HPI, otherwise negative  Skin: Negative.   Neurological:  Negative for syncope.   Physical Exam Updated Vital Signs BP 139/72 (BP Location: Left Arm)    Pulse 76    Temp 98.2 F (36.8 C)    Resp 14    SpO2 98%  Physical Exam Vitals and nursing note reviewed.  Constitutional:      General: He is not in acute distress.    Appearance: He is well-developed.  HENT:     Head: Normocephalic and atraumatic.  Eyes:     Conjunctiva/sclera: Conjunctivae normal.  Cardiovascular:     Rate and Rhythm: Normal rate and regular rhythm.  Pulmonary:     Effort: Tachypnea present.     Breath sounds:  Decreased breath sounds present.  Abdominal:     General: There is no distension.  Skin:    General: Skin is warm and dry.  Neurological:     Mental Status: He is alert and oriented to person, place, and time.    ED Results / Procedures / Treatments   Labs (all labs ordered are listed, but only abnormal results are displayed) Labs Reviewed  COMPREHENSIVE METABOLIC PANEL - Abnormal; Notable for the following components:      Result Value   CO2 17 (*)    Glucose, Bld 118 (*)    BUN 29 (*)    Creatinine, Ser 1.91 (*)    AST 82 (*)    ALT 77 (*)    GFR, Estimated 35 (*)    All other components within normal limits  CBC WITH DIFFERENTIAL/PLATELET - Abnormal; Notable for the following components:   Hemoglobin 11.9 (*)    MCH 23.2 (*)    MCHC 28.5 (*)    RDW 24.8 (*)    Platelets 654 (*)    Monocytes Absolute 1.1 (*)    All other components within normal limits  TROPONIN I (HIGH SENSITIVITY) - Abnormal; Notable for the following components:   Troponin I (High Sensitivity) 74 (*)    All other components within normal limits  TROPONIN I (HIGH SENSITIVITY) - Abnormal; Notable for the following components:   Troponin I (High Sensitivity) 76 (*)    All other components within normal limits  URINE CULTURE  URINALYSIS, ROUTINE W REFLEX MICROSCOPIC    EKG Sinus, 74, ST wave changes abnormal   Radiology DG Chest 2 View  Result Date: 12/30/2021 CLINICAL DATA:  Chest pain with shortness of breath. EXAM: CHEST - 2 VIEW COMPARISON:  Chest x-ray 12/11/2021. FINDINGS: The heart is enlarged. There central pulmonary vascular congestion. There is no lung consolidation, there is no lung consolidation or pneumothorax. There is a small left pleural effusion. No acute fractures are seen. IMPRESSION: 1. Cardiomegaly with central pulmonary vascular congestion. 2. Small left pleural effusion. Electronically Signed   By: Ronney Asters M.D.   On: 12/30/2021 21:26    Procedures Procedures     Medications Ordered in ED Medications - No data to display  ED Course/ Medical Decision Making/ A&P Amount of the patient has sinus rhythm, rate 70s, unremarkable Pulse ox 96%, room air, borderline Broad differential including heart failure exacerbation, pneumonia, ACS, DKA, hyperglycemia all considered.  On repeat exam the patient is calm, now  done by his son, with additional historical details as above, we discussed this case.    EMR review most notable for discharge last month-NSTEMI, echocardiogram during that visit as below.  Troponin substantially elevated, trending down on discharge:  ECHO IMPRESSIONS     1. Left ventricular ejection fraction, by estimation, is 30 to 35%. The  left ventricle has moderately decreased function. The left ventricle  demonstrates global hypokinesis. There is moderate left ventricular  hypertrophy. Left ventricular diastolic  parameters are consistent with Grade III diastolic dysfunction  (restrictive). Elevated left atrial pressure.   2. Right ventricular systolic function is normal. The right ventricular  size is normal. There is moderately elevated pulmonary artery systolic  pressure. The estimated right ventricular systolic pressure is 46.6 mmHg.   3. Left atrial size was severely dilated.   4. The mitral valve is normal in structure. Moderate mitral valve  regurgitation. Appears functional.   5. The aortic valve is tricuspid. Aortic valve regurgitation is mild.  Aortic valve sclerosis/calcification is present, without any evidence of  aortic stenosis.   6. The inferior vena cava is dilated in size with <50% respiratory  variability, suggesting right atrial pressure of 15 mmHg.                         Medical Decision Making Repeat exam the patient is calm.  We discussed all findings again.  Adult male presents with dyspnea for 1 day.  Patient's history is notable for CHF, asthma, and findings here concerning for worsening heart  failure, no early evidence for concurrent pneumonia, bacteremia, sepsis.  No evidence for coronary ischemia, on chart review and interpretation of prior echocardiogram and today's x-ray there is concern for heart failure exacerbation with pleural effusion, as above.  Patient received IV diuresis here.  I discussed his case with our internal medicine colleagues he was admitted for monitoring, management.        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

## 2021-12-31 NOTE — Assessment & Plan Note (Signed)
-  Patient with volume overload on admission, but his real complaint at the time of my evaluation was suprapubic pain -Bladder scan with 500+ cc urine -Foley placed -UA and urine culture are pending -STAT renal CT, stone protocol

## 2021-12-31 NOTE — Assessment & Plan Note (Addendum)
-  No report of chest pain at this time -Continue ASA, Bidil -Troponin flat at this time

## 2021-12-31 NOTE — ED Notes (Signed)
Per phlebotomist tyrone patient is covered in feces and urine. Patient taken to room in green to be cleaned. Patient noted to be clean, but brief and new paper scrub pants put on patient.

## 2021-12-31 NOTE — H&P (Signed)
History and Physical    PatientTavious [name] RWE:[id number] DOB: [birth year] DOA: 12/30/2021 DOS: the patient was seen and examined on 12/31/2021 PCP: Jilda Panda, MD  Patient coming from: Home; NOK: [place], [place], [phone]   Chief Complaint: SOB  HPI: Randy [name] is a 81 y.o. male with medical history significant of chronic systolic CHF, recent NSTEMI, and HTN presenting with SOB.  The patient speaks limited [place].  He reports SOB x 1 day and LE edema (improved while in the ER).  However, he has had multiple episodes of diarrhea in the ER.  He also complains of what appears to be significant suprapubic pain that is worse with urination/stooling.  Additional history is difficult due to language issues.  He was previously admitted from 12/1-8 for decompensated CHF and NSTEMI as well as CAP; he also had melenotic stools and underwent EGD (unremarkable) but colonoscopy was not planned due to his poor health condition.  He returned from 12/21-24 with recurrent acute CHF. He returned to the ER on 12/28 with abdominal pain and constipation.  He was heme negative and CT indicated constipation.  He was given antibiotics for cellulitis and discharged.  ER Course:  CHF - taking Lasix but still fluid overloaded.    Review of Systems: As mentioned in the history of present illness. All other systems reviewed and are negative.  Limited by language. 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   Hearing loss    HTN (hypertension)    Past Surgical History:  Procedure Laterality Date   ESOPHAGOGASTRODUODENOSCOPY (EGD) WITH PROPOFOL N/A 11/26/2021   Procedure: ESOPHAGOGASTRODUODENOSCOPY (EGD) WITH PROPOFOL;  Surgeon: Doran Stabler, MD;  Location: [facility];  Service: Gastroenterology;  Laterality: N/A;   EYE SURGERY     IABP INSERTION N/A 01/30/2021   Procedure: IABP Insertion;   Surgeon: Leonie Man, MD;  Location: [facility];  Service: Cardiovascular;  Laterality: N/A;   RIGHT/LEFT HEART CATH AND CORONARY ANGIOGRAPHY N/A 01/30/2021   Procedure: RIGHT/LEFT HEART CATH AND CORONARY ANGIOGRAPHY;  Surgeon: Leonie Man, MD;  Location: [facility];  Service: Cardiovascular;  Laterality: N/A;   Social History:  reports that he has never smoked. He has never used smokeless tobacco. He reports that he does not drink alcohol and does not use drugs.  No Known Allergies  History reviewed. No pertinent family history.  Prior to Admission medications   Medication Sig Start Date End Date Taking? Authorizing Provider  acetaminophen (TYLENOL) 325 MG tablet Take 2 tablets (650 mg total) by mouth every 4 (four) hours as needed for headache or mild pain. 07/04/21   Isaiah Serge, NP  aspirin 81 MG EC tablet Take 1 tablet (81 mg total) by mouth daily. Swallow whole. 11/28/21   Thurnell Lose, MD  atorvastatin (LIPITOR) 80 MG tablet Take 1 tablet (80 mg total) by mouth daily. 02/06/21   Hosie Poisson, MD  carvedilol (COREG) 6.25 MG tablet Take 1 tablet (6.25 mg total) by mouth 2 (two) times daily with a meal. 12/14/21 01/13/22  Darliss Cheney, MD  cefUROXime (CEFTIN) 500 MG tablet Take 1 tablet (500 mg total) by mouth 2 (two) times daily with a meal. 12/19/21   Molpus, John, MD  clopidogrel (PLAVIX) 75 MG tablet Take 1 tablet (75 mg total) by mouth daily. 02/06/21   Hosie Poisson, MD  ferrous sulfate 325 (65 FE) MG tablet  Take 1 tablet (325 mg total) by mouth 2 (two) times daily with a meal. 11/28/21   Thurnell Lose, MD  finasteride (PROSCAR) 5 MG tablet Take 1 tablet (5 mg total) by mouth daily. 11/21/21   Lavina Hamman, MD  furosemide (LASIX) 80 MG tablet Take 1 tablet (80 mg total) by mouth daily. 11/28/21   Thurnell Lose, MD  isosorbide-hydrALAZINE (BIDIL) 20-37.5 MG tablet Take 1 tablet by mouth 3 (three) times daily. 07/04/21   Isaiah Serge, NP   JARDIANCE 10 MG TABS tablet Take 10 mg by mouth daily. 11/10/21   [provider]  metFORMIN (GLUCOPHAGE) 500 MG tablet Take 500 mg by mouth in the morning and at bedtime. 06/10/21   [provider]  nitroGLYCERIN (NITROSTAT) 0.4 MG SL tablet Place 1 tablet (0.4 mg total) under the tongue every 5 (five) minutes x 3 doses as needed for chest pain. 02/05/21   Hosie Poisson, MD  pantoprazole (PROTONIX) 40 MG tablet Take 1 tablet (40 mg total) by mouth 2 (two) times daily before a meal. 11/28/21   Thurnell Lose, MD  tamsulosin (FLOMAX) 0.4 MG CAPS capsule Take 2 capsules (0.8 mg total) by mouth daily. 11/28/21   Thurnell Lose, MD  vitamin B-12 (CYANOCOBALAMIN) 500 MCG tablet Take 1 tablet (500 mcg total) by mouth daily. 11/21/21   Lavina Hamman, MD    Physical Exam: Vitals:   12/31/21 1530 12/31/21 1600 12/31/21 1900 12/31/21 1945  BP: 125/74 (!) 147/78  (!) 143/82  Pulse: 63 78    Resp: 15 (!) 21  19  Temp:    98.5 F (36.9 C)  TempSrc:    Oral  SpO2: 100% 100%  93%  Weight:   60.5 kg   Height:   5\' 2"  (1.575 m)    General:  Appears uncomfortable and fidgety; there is diarrheal stool in the bedside commode that is brown in color.  Pointing to his suprapubic region and continuously stating that he is having pain with urination and stooling Eyes:  EOMI, normal lids, iris ENT:  grossly normal hearing, lips & tongue, mmm; mostly absent dentition Neck:  no LAD, masses or thyromegaly Cardiovascular:  RRR, no m/r/g. 3+ LE edema.  Respiratory:   CTA bilaterally with no wheezes/rales/rhonchi.  Normal respiratory effort.  On RA Abdomen:  soft, NT except in suprapubic region Skin:  no rash or induration seen on limited exam Musculoskeletal:  grossly normal tone BUE/BLE, good ROM, no bony abnormality Psychiatric:  uncomfortable mood and affect, speech fluent and appropriate with limited English Neurologic:  CN 2-12 grossly intact, moves all extremities in coordinated  fashion   Radiological Exams on Admission: Independently reviewed - see discussion in A/P where applicable  DG Chest 2 View  Result Date: 12/30/2021 CLINICAL DATA:  Chest pain with shortness of breath. EXAM: CHEST - 2 VIEW COMPARISON:  Chest x-ray 12/11/2021. FINDINGS: The heart is enlarged. There central pulmonary vascular congestion. There is no lung consolidation, there is no lung consolidation or pneumothorax. There is a small left pleural effusion. No acute fractures are seen. IMPRESSION: 1. Cardiomegaly with central pulmonary vascular congestion. 2. Small left pleural effusion. Electronically Signed   By: Ronney Asters M.D.   On: 12/30/2021 21:26    EKG: Independently reviewed.  NSR with rate 74; nonspecific ST changes with no evidence of acute ischemia   Labs on Admission: I have personally reviewed the available labs and imaging studies at the time of the admission.  Pertinent labs:    CO2 17 Glucose 118 BUN 29/Creatinine 1.91/GFR 35 - stable AST 82/ALT 77 BNP >4500; 2635 on 12/21 HS troponin 74, 76 Platelets 654 Echo on 12/2 with EF 30-35% and grade 3 diastolic dysfunction   Assessment/Plan * Acute on chronic systolic (congestive) heart failure (HCC)- (present on admission) -Patient with known h/o chronic combined CHF presenting with ongoing edema -CXR with cardiomegaly and vascular congestion but not frank edema -Markedly elevated BNP compared to a few weeks ago -Acute decompensated CHF seems probable as or at contributing to diagnosis -Will place in observation status with telemetry, as there are no current findings necessitating admission (hemodynamic instability, severe electrolyte abnormalities, cardiac arrhythmias, ACS, severe pulmonary edema requiring new O2 therapy, AMS) -CHF order set utilized -Was given Lasix 120 mg x 1 in ER and will repeat with 80 mg IV BID -prn [place] O2 for now -Mildly elevated HS troponin is likely related to demand ischemia; doubt ACS based on  symptoms  HTN -Continue Bidil, Coreg -Will also add prn hydralazine  Diarrhea- (present on admission) -Possibly related to acute illness or a result of other acute issues -Will check C diff given recent prior hospitalizations with antibiotics -Also with prior GI bleed; will check guaiac but it looked brown at the time of my evaluation  Hyperglycemia- (present on admission) -Recent A1c was 6.7, which is diagnostic for DM but at goal despite no medications -Will cover with moderate-scale SSI while inpatient  Urinary retention due to benign prostatic hyperplasia- (present on admission) -Patient with volume overload on admission, but his real complaint at the time of my evaluation was suprapubic pain -Bladder scan with 500+ cc urine -Foley placed -UA and urine culture are pending -STAT renal CT, stone protocol  Mixed hyperlipidemia- (present on admission) -Continue Lipitor  Stage 3b chronic kidney disease ([place])- (present on admission) -Appears to stable at this time -Will follow  CAD, multiple vessel- (present on admission) -No report of chest pain at this time -Continue ASA, Bidil -Troponin flat at this time    Advance Care Planning:   Code Status: Full Code   Consults: heart failure navigator, TOC team, nutrition, PT,OT  Family Communication: None present and I did not have the opportunity to contact them at the time of admission  Severity of Illness: The appropriate patient status for this patient is OBSERVATION. Observation status is judged to be reasonable and necessary in order to provide the required intensity of service to ensure the patient's safety. The patient's presenting symptoms, physical exam findings, and initial radiographic and laboratory data in the context of their medical condition is felt to place them at decreased risk for further clinical deterioration. Furthermore, it is anticipated that the patient will be medically stable for discharge from the  hospital within 2 midnights of admission.   Author: Karmen Bongo, MD 12/31/2021 7:53 PM  For on call review [email or web address].

## 2021-12-31 NOTE — Assessment & Plan Note (Signed)
-  Appears to stable at this time -Will follow

## 2021-12-31 NOTE — Assessment & Plan Note (Signed)
-  Possibly related to acute illness or a result of other acute issues -Will check C diff given recent prior hospitalizations with antibiotics -Also with prior GI bleed; will check guaiac but it looked brown at the time of my evaluation

## 2021-12-31 NOTE — Progress Notes (Signed)
Patient came to the floor c/o pain due to unable to urinate bladder scan done 551 cc in the bladder. MD notified, MD instruct to insert foley catheter. 80F foley insert per order. Patient tolerate well and sleeping comfortable. Will continue to monitor.

## 2021-12-31 NOTE — Assessment & Plan Note (Signed)
-  Patient with known h/o chronic combined CHF presenting with ongoing edema -CXR with cardiomegaly and vascular congestion but not frank edema -Markedly elevated BNP compared to a few weeks ago -Acute decompensated CHF seems probable as or at contributing to diagnosis -Will place in observation status with telemetry, as there are no current findings necessitating admission (hemodynamic instability, severe electrolyte abnormalities, cardiac arrhythmias, ACS, severe pulmonary edema requiring new O2 therapy, AMS) -CHF order set utilized -Was given Lasix 120 mg x 1 in ER and will repeat with 80 mg IV BID -prn [place] O2 for now -Mildly elevated HS troponin is likely related to demand ischemia; doubt ACS based on symptoms  HTN -Continue Bidil, Coreg -Will also add prn hydralazine

## 2021-12-31 NOTE — Assessment & Plan Note (Signed)
-  Recent A1c was 6.7, which is diagnostic for DM but at goal despite no medications -Will cover with moderate-scale SSI while inpatient

## 2021-12-31 NOTE — Assessment & Plan Note (Signed)
-  Continue Lipitor °

## 2022-01-01 DIAGNOSIS — R197 Diarrhea, unspecified: Secondary | ICD-10-CM | POA: Diagnosis present

## 2022-01-01 DIAGNOSIS — Z603 Acculturation difficulty: Secondary | ICD-10-CM | POA: Diagnosis present

## 2022-01-01 DIAGNOSIS — I5043 Acute on chronic combined systolic (congestive) and diastolic (congestive) heart failure: Secondary | ICD-10-CM | POA: Diagnosis present

## 2022-01-01 DIAGNOSIS — I2510 Atherosclerotic heart disease of native coronary artery without angina pectoris: Secondary | ICD-10-CM

## 2022-01-01 DIAGNOSIS — N401 Enlarged prostate with lower urinary tract symptoms: Secondary | ICD-10-CM

## 2022-01-01 DIAGNOSIS — I509 Heart failure, unspecified: Secondary | ICD-10-CM

## 2022-01-01 DIAGNOSIS — I5023 Acute on chronic systolic (congestive) heart failure: Secondary | ICD-10-CM | POA: Diagnosis not present

## 2022-01-01 DIAGNOSIS — E782 Mixed hyperlipidemia: Secondary | ICD-10-CM

## 2022-01-01 DIAGNOSIS — Z9114 Patient's other noncompliance with medication regimen: Secondary | ICD-10-CM | POA: Diagnosis not present

## 2022-01-01 DIAGNOSIS — R739 Hyperglycemia, unspecified: Secondary | ICD-10-CM | POA: Diagnosis not present

## 2022-01-01 DIAGNOSIS — R4189 Other symptoms and signs involving cognitive functions and awareness: Secondary | ICD-10-CM | POA: Diagnosis present

## 2022-01-01 DIAGNOSIS — I959 Hypotension, unspecified: Secondary | ICD-10-CM | POA: Diagnosis not present

## 2022-01-01 DIAGNOSIS — R338 Other retention of urine: Secondary | ICD-10-CM | POA: Diagnosis present

## 2022-01-01 DIAGNOSIS — N179 Acute kidney failure, unspecified: Secondary | ICD-10-CM | POA: Diagnosis present

## 2022-01-01 DIAGNOSIS — Z20822 Contact with and (suspected) exposure to covid-19: Secondary | ICD-10-CM | POA: Diagnosis present

## 2022-01-01 DIAGNOSIS — N1832 Chronic kidney disease, stage 3b: Secondary | ICD-10-CM | POA: Diagnosis present

## 2022-01-01 DIAGNOSIS — I255 Ischemic cardiomyopathy: Secondary | ICD-10-CM | POA: Diagnosis present

## 2022-01-01 DIAGNOSIS — I248 Other forms of acute ischemic heart disease: Secondary | ICD-10-CM | POA: Diagnosis present

## 2022-01-01 DIAGNOSIS — I272 Pulmonary hypertension, unspecified: Secondary | ICD-10-CM | POA: Diagnosis present

## 2022-01-01 DIAGNOSIS — E440 Moderate protein-calorie malnutrition: Secondary | ICD-10-CM | POA: Diagnosis present

## 2022-01-01 DIAGNOSIS — E1122 Type 2 diabetes mellitus with diabetic chronic kidney disease: Secondary | ICD-10-CM | POA: Diagnosis present

## 2022-01-01 DIAGNOSIS — Z66 Do not resuscitate: Secondary | ICD-10-CM | POA: Diagnosis present

## 2022-01-01 DIAGNOSIS — I130 Hypertensive heart and chronic kidney disease with heart failure and stage 1 through stage 4 chronic kidney disease, or unspecified chronic kidney disease: Secondary | ICD-10-CM | POA: Diagnosis present

## 2022-01-01 DIAGNOSIS — E871 Hypo-osmolality and hyponatremia: Secondary | ICD-10-CM | POA: Diagnosis present

## 2022-01-01 DIAGNOSIS — F03911 Unspecified dementia, unspecified severity, with agitation: Secondary | ICD-10-CM | POA: Diagnosis present

## 2022-01-01 DIAGNOSIS — Z6822 Body mass index (BMI) 22.0-22.9, adult: Secondary | ICD-10-CM | POA: Diagnosis not present

## 2022-01-01 DIAGNOSIS — E1165 Type 2 diabetes mellitus with hyperglycemia: Secondary | ICD-10-CM | POA: Diagnosis present

## 2022-01-01 LAB — BASIC METABOLIC PANEL
Anion gap: 11 (ref 5–15)
BUN: 32 mg/dL — ABNORMAL HIGH (ref 8–23)
CO2: 23 mmol/L (ref 22–32)
Calcium: 8.4 mg/dL — ABNORMAL LOW (ref 8.9–10.3)
Chloride: 105 mmol/L (ref 98–111)
Creatinine, Ser: 2.2 mg/dL — ABNORMAL HIGH (ref 0.61–1.24)
GFR, Estimated: 29 mL/min — ABNORMAL LOW (ref >60)
Glucose, Bld: 121 mg/dL — ABNORMAL HIGH (ref 70–99)
Potassium: 4.2 mmol/L (ref 3.5–5.1)
Sodium: 139 mmol/L (ref 135–145)

## 2022-01-01 LAB — GLUCOSE, CAPILLARY
Glucose-Capillary: 144 mg/dL — ABNORMAL HIGH (ref 70–99)
Glucose-Capillary: 122 mg/dL — ABNORMAL HIGH (ref 70–99)
Glucose-Capillary: 137 mg/dL — ABNORMAL HIGH (ref 70–99)
Glucose-Capillary: 143 mg/dL — ABNORMAL HIGH (ref 70–99)

## 2022-01-01 LAB — CBC
HCT: 35.9 % — ABNORMAL LOW (ref 39.0–52.0)
Hemoglobin: 10.9 g/dL — ABNORMAL LOW (ref 13.0–17.0)
MCH: 23.5 pg — ABNORMAL LOW (ref 26.0–34.0)
MCHC: 30.4 g/dL (ref 30.0–36.0)
MCV: 77.4 fL — ABNORMAL LOW (ref 80.0–100.0)
Platelets: 584 10*3/uL — ABNORMAL HIGH (ref 150–400)
RBC: 4.64 MIL/uL (ref 4.22–5.81)
RDW: 24.1 % — ABNORMAL HIGH (ref 11.5–15.5)
WBC: 9.4 10*3/uL (ref 4.0–10.5)
nRBC: 0.2 % (ref 0.0–0.2)

## 2022-01-01 LAB — C DIFFICILE QUICK SCREEN W PCR REFLEX
C Diff antigen: NEGATIVE
C Diff interpretation: NOT DETECTED
C Diff toxin: NEGATIVE

## 2022-01-01 LAB — RESP PANEL BY RT-PCR (FLU A&B, COVID) ARPGX2
Influenza A by PCR: NEGATIVE
Influenza B by PCR: NEGATIVE
SARS Coronavirus 2 by RT PCR: NEGATIVE

## 2022-01-01 NOTE — Evaluation (Signed)
Physical Therapy Evaluation and Discharge Patient Details Name: Randy [name] MRN: [id number] DOB: [birth year] Today's Date: 01/01/2022  History of Present Illness  Randy [name] is a 81 y.o. male presenting with SOB. PMH: chronic systolic CHF, recent NSTEMI, and HTN, previous admission 12/1-8 for decompensated CHF and NSTEMI, returned 12/21-24 with recurrent acute CHF, ED on 12/28 for abdominal pain, imaging revealed constipation, d/c'd from ED   Clinical Impression  Pt admitted with above. [place] interpreter used for eval. Pt functioning independently. Pt defers socks on his feet due to pain from swelling and socks being too tight. Pt provided with bariatric socks and cut them down the side and pt agreed to wear them for PT eval. Pt completed necessary stair negotiation and ambulation to return to temple. Pt with no further acute PT needs. PT SIGNING OFF. Please re-consult if needed in future.      Recommendations for follow up therapy are one component of a multi-disciplinary discharge planning process, led by the attending physician.  Recommendations may be updated based on patient status, additional functional criteria and insurance authorization.  Follow Up Recommendations No PT follow up    Assistance Recommended at Discharge PRN  Patient can return home with the following       Equipment Recommendations None recommended by PT  Recommendations for Other Services       Functional Status Assessment Patient has had a recent decline in their functional status and demonstrates the ability to make significant improvements in function in a reasonable and predictable amount of time.     Precautions / Restrictions Precautions Precautions: None Restrictions Weight Bearing Restrictions: No      Mobility  Bed Mobility               General bed mobility comments: pt sitting EOB upon PT arrival    Transfers Overall transfer level: Independent                  General transfer comment: no difficulty, able to manage foley catheter indep    Ambulation/Gait Ambulation/Gait assistance: Independent Gait Distance (Feet): 200 Feet Assistive device: None Gait Pattern/deviations: WFL(Within Functional Limits) Gait velocity: wfl Gait velocity interpretation: >2.62 ft/sec, indicative of community ambulatory   General Gait Details: no LOB, no instability  Stairs Stairs: Yes Stairs assistance: Modified independent (Device/Increase time) Stair Management: One rail Right;Alternating pattern;Forwards Number of Stairs: 12 General stair comments: no difficulty  Wheelchair Mobility    Modified Rankin (Stroke Patients Only)       Balance Overall balance assessment: No apparent balance deficits (not formally assessed)                                           Pertinent Vitals/Pain Pain Assessment:  (pt reports pain in bilat feet when socks are donned because they are too tight)    Home Living Family/patient expects to be discharged to:: Other (Comment)                   Additional Comments: [facility] lives a [facility], 5 steps to enter with Bilat HR, one level. 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 ADLs Comments: indep     Hand Dominance   Dominant Hand: Right    Extremity/Trunk Assessment   Upper Extremity Assessment Upper Extremity Assessment: Overall WFL for tasks assessed    Lower Extremity Assessment Lower Extremity Assessment: Overall WFL for tasks assessed    Cervical / Trunk Assessment Cervical / Trunk Assessment: Normal  Communication   Communication: Interpreter utilized  Cognition Arousal/Alertness: Awake/alert Behavior During Therapy: WFL for  tasks assessed/performed Overall Cognitive Status: Within Functional Limits for tasks assessed                                          General Comments General comments (skin integrity, edema, etc.): noted bilat LE edema    Exercises     Assessment/Plan    PT Assessment Patient does not need any further PT services  PT Problem List         PT Treatment Interventions      PT Goals (Current goals can be found in the Care Plan section)  Acute Rehab PT Goals PT Goal Formulation: All assessment and education complete, DC therapy    Frequency       Co-evaluation               AM-PAC PT "6 Clicks" Mobility  Outcome Measure Help needed turning from your back to your side while in a flat bed without using bedrails?: None Help needed moving from lying on your back to sitting on the side of a flat bed without using bedrails?: None Help needed moving to and from a bed to a chair (including a wheelchair)?: None Help needed standing up from a chair using your arms (e.g., wheelchair or bedside chair)?: None Help needed to walk in hospital room?: None Help needed climbing 3-5 steps with a railing? : None 6 Click Score: 24    End of Session   Activity Tolerance: Patient tolerated treatment well Patient left: in bed;with call bell/phone within reach (sitting EOB, interpreter present in room) Nurse Communication: Mobility status PT Visit Diagnosis: Muscle weakness (generalized) (M62.81)    Time: 6761-9509 PT Time Calculation (min) (ACUTE ONLY): 17 min   Charges:   PT Evaluation $PT Eval Low Complexity: 1 Low          Kittie Plater, PT, DPT Acute Rehabilitation Services Pager #: [phone] Office #: [phone]   Berline Lopes 01/01/2022, 1:44 PM

## 2022-01-01 NOTE — Evaluation (Signed)
Occupational Therapy Evaluation/Discharge Patient Details Name: Randy [name] MRN: [id number] DOB: 06/09/1941 Today's Date: 01/01/2022   History of Present Illness Randy [name] is a 81 y.o. male presenting with SOB. PMH: chronic systolic CHF, recent NSTEMI, and HTN, previous admission 12/1-8 for decompensated CHF and NSTEMI, returned 12/21-24 with recurrent acute CHF, ED on 12/28 for abdominal pain, imaging revealed constipation, d/c'd from ED   Clinical Impression   PTA, pt resides at [facility], independent with all ADLs, IADLs and mobility though has occasional transportation assist. Pt denies any recent hx of falls and able to demo ADLs/mobility Independently at this time. Encouraged continued elevation of BLE, exercises and mobility to combat B LE edema. SpO2 94% on RA after activity. In-person [employer] interpreter utilized during session. No further skilled OT services needed at acute level or on DC. OT to sign off.      Recommendations for follow up therapy are one component of a multi-disciplinary discharge planning process, led by the attending physician.  Recommendations may be updated based on patient status, additional functional criteria and insurance authorization.   Follow Up Recommendations  No OT follow up    Assistance Recommended at Discharge PRN  Patient can return home with the following      Functional Status Assessment  Patient has not had a recent decline in their functional status  Equipment Recommendations  None recommended by OT    Recommendations for Other Services       Precautions / Restrictions Precautions Precautions: None Restrictions Weight Bearing Restrictions: No      Mobility Bed Mobility Overal bed mobility: Modified Independent             General bed mobility comments: pt sitting EOB upon PT arrival    Transfers Overall transfer level: Independent                 General transfer comment: no difficulty,  able to manage foley catheter indep      Balance Overall balance assessment: No apparent balance deficits (not formally assessed)                                         ADL either performed or assessed with clinical judgement   ADL Overall ADL's : Independent                                             Vision Baseline Vision/History: 1 Wears glasses Ability to See in Adequate Light: 0 Adequate Vision Assessment?: No apparent visual deficits     Perception     Praxis      Pertinent Vitals/Pain Pain Assessment: No/denies pain     Hand Dominance Right   Extremity/Trunk Assessment Upper Extremity Assessment Upper Extremity Assessment: Overall WFL for tasks assessed   Lower Extremity Assessment Lower Extremity Assessment: Defer to PT evaluation   Cervical / Trunk Assessment Cervical / Trunk Assessment: Normal   Communication Communication Communication: Interpreter utilized   Cognition Arousal/Alertness: Awake/alert Behavior During Therapy: WFL for tasks assessed/performed Overall Cognitive Status: Within Functional Limits for tasks assessed  General Comments  encouraged elevation, continued mobility/exercise to combat B LE edema    Exercises     Shoulder Instructions      Home Living Family/patient expects to be discharged to:: Other (Comment)                                 Additional Comments: pt lives a [facility], 5 steps to enter with Bilat HR, one level. 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 reports family may be helping him get another car        OT Problem List:        OT Treatment/Interventions:      OT Goals(Current goals can be found in the care plan section) Acute Rehab OT Goals Patient Stated Goal: decrease swelling OT Goal Formulation: All assessment and education complete, DC therapy  OT Frequency:      Co-evaluation              AM-PAC OT "6 Clicks" Daily Activity     Outcome Measure Help from another person eating meals?: None Help from another person taking care of personal grooming?: None Help from another person toileting, which includes using toliet, bedpan, or urinal?: None Help from another person bathing (including washing, rinsing, drying)?: None Help from another person to put on and taking off regular upper body clothing?: None Help from another person to put on and taking off regular lower body clothing?: None 6 Click Score: 24   End of Session Nurse Communication: Mobility status  Activity Tolerance: Patient tolerated treatment well Patient left: in bed;with call bell/phone within reach;Other (comment) (with PT and interpreter)  OT Visit Diagnosis: Other (comment) (edema)                Time: 5284-1324 OT Time Calculation (min): 23 min Charges:  OT General Charges $OT Visit: 1 Visit OT Evaluation $OT Eval Low Complexity: 1 Low  Malachy Chamber, OTR/L Acute Rehab Services Office: [phone]   Layla Maw 01/01/2022, 2:14 PM

## 2022-01-01 NOTE — Progress Notes (Signed)
Nutrition Brief Note  RD consulted for nutrition goals assessment. Attempted to visit pt. Assessment unable to be completed d/t pt resting and inability to reach interpreter services. Spoke with RN who reports pt is consuming 100% of his meals. Will re-attempt to follow up with pt for full nutrition assessment tomorrow.   Body mass index is 23.63 kg/m. Patient meets criteria for normal weight based on current BMI.   Current diet order is Heart Healthy, patient is consuming approximately 100% of meals at this time. Labs and medications reviewed.  Clayborne Dana, RDN, LDN Clinical Nutrition

## 2022-01-01 NOTE — Progress Notes (Signed)
PROGRESS NOTE    Randy [name]  TKP:[id number] DOB: [birth year] DOA: 12/30/2021 PCP: Jilda Panda, MD    Brief Narrative:  Mr. Mazon was admitted to the hospital with the working diagnosis of acute on chronic systolic heart failure exacerbation.   81 yo male with the past medical history of chronic systolic heart failure, CAD, and HTN who presented with dyspnea. Limited history due to language barrier. Reported 24 hrs of dyspnea, and lower extremity edema. Positive suprapubic pain. On his initial physical examination his blood pressure was 125/74, HR 63, RR 21 and oxygen saturation 93%, lung with no rales or wheezing, heart with S1 and S2 present and rhythmic, abdomen soft, positive 3+ lower extremity edema.   Na 138, K 4,6, cl 109, bicarb 17, glucose 118, bun 29 and cr at 1,91 AST 82 and ALT 77 BNP >4,500  Wbc 10.1, hgb 11.9, hct 41,7 and plt 654 SARS COVID 19 negative  Ua with specific gravity 1.020, 0-5 wbc and >50 rbc   Chest radiograph with cardiomegaly and increase hilar vascular congestion, small left pleural effusion., (personally reviewed).   KG 74 bpm, with normal axis and normal intervals, sinus rhythm with no significant ST segment or T wave changes, low voltage.   Patient has been placed on furosemide for diuresis.    Assessment & Plan:   Principal Problem:   Acute on chronic systolic (congestive) heart failure (HCC) Active Problems:   CAD, multiple vessel   Stage 3b chronic kidney disease ([place])   Mixed hyperlipidemia   Urinary retention due to benign prostatic hyperplasia   Hyperglycemia   Diarrhea   Acute on chronic systolic heart failure exacerbation. Patient not compliant with medical therapy at home, today he continue to have signs of volume overload, not back to his baseline.  Urine output 2,575 ml over last 24 hrs.   Plan to continue aggressive diuresis with IV furosemide, to target further negative fluid balance. Furosemide 80 mg Iv q 12 hrs.   After load reduction with hydralazine and isosorbide.  Continue with carvedilol.  Not on RAAS inhibition due to risk of worsening renal function   2. T2DM dyslipidemia continue insulin therapy with sliding scale for glucose cover and monitoring   3.  AKI on Stage 3 b CKD  Renal function with serum cr at 2,20 with K at 4,2 and serum bicarbonate at 23.,\ Continue to have edema lower extremities, plan to continue diuresis and follow up renal function in am. Avoid hypotension.   Patient continue to be at high risk for worsening heart failure   Status is: Observation  The patient will require care spanning > 2 midnights and should be moved to inpatient because: IV diuresis    DVT prophylaxis: Enoxaparin   Code Status:    full  Family Communication:  No family at the bedside, patient with poor family support.      Subjective: Patient continue to have lower extremity edema, dyspnea has improved, continue to be very weak and deconditioned   Objective: Vitals:   01/01/22 0357 01/01/22 0749 01/01/22 0824 01/01/22 1104  BP: (!) 106/46  (!) 116/99   Pulse: 68     Resp: 18 16  20   Temp: 98.3 F (36.8 C) (!) 97.4 F (36.3 C)  97.7 F (36.5 C)  TempSrc: Oral Oral  Oral  SpO2: 97%     Weight:      Height:        Intake/Output Summary (Last 24 hours) at 01/01/2022 1403 Last  data filed at 01/01/2022 1110 Gross per 24 hour  Intake 960 ml  Output 3275 ml  Net -2315 ml   Filed Weights   12/31/21 1900 01/01/22 0016  Weight: 60.5 kg 58.6 kg    Examination:   General: Not in pain or dyspnea, deconditioned  Neurology: Awake and alert, non focal  E ENT: mild pallor, no icterus, oral mucosa moist Cardiovascular:  positive moderate JVD. S1-S2 present, rhythmic, no gallops, rubs, or murmurs. ++ pitting bilateral lower extremity edema. Pulmonary: positive breath sounds bilaterally, no wheezing on anterior auscultation  Gastrointestinal. Abdomen soft and non tender Skin. No  rashes Musculoskeletal: no joint deformities     Data Reviewed: I have personally reviewed following labs and imaging studies  CBC: Recent Labs  Lab 12/30/21 2058 01/01/22 0332  WBC 10.1 9.4  NEUTROABS 7.7  --   HGB 11.9* 10.9*  HCT 41.7 35.9*  MCV 81.1 77.4*  PLT 654* 588*   Basic Metabolic Panel: Recent Labs  Lab 12/30/21 2058 01/01/22 0332  NA 138 139  K 4.6 4.2  CL 109 105  CO2 17* 23  GLUCOSE 118* 121*  BUN 29* 32*  CREATININE 1.91* 2.20*  CALCIUM 8.9 8.4*   GFR: Estimated Creatinine Clearance: 20 mL/min (A) (by C-G formula based on SCr of 2.2 mg/dL (H)). Liver Function Tests: Recent Labs  Lab 12/30/21 2058  AST 82*  ALT 77*  ALKPHOS 125  BILITOT 0.7  PROT 7.2  ALBUMIN 3.6   No results for input(s): LIPASE, AMYLASE in the last 168 hours. No results for input(s): AMMONIA in the last 168 hours. Coagulation Profile: No results for input(s): INR, PROTIME in the last 168 hours. Cardiac Enzymes: No results for input(s): CKTOTAL, CKMB, CKMBINDEX, TROPONINI in the last 168 hours. BNP (last 3 results) No results for input(s): PROBNP in the last 8760 hours. HbA1C: No results for input(s): HGBA1C in the last 72 hours. CBG: Recent Labs  Lab 12/31/21 2101 01/01/22 0604 01/01/22 1108  GLUCAP 139* 144* 122*   Lipid Profile: No results for input(s): CHOL, HDL, LDLCALC, TRIG, CHOLHDL, LDLDIRECT in the last 72 hours. Thyroid Function Tests: No results for input(s): TSH, T4TOTAL, FREET4, T3FREE, THYROIDAB in the last 72 hours. Anemia Panel: No results for input(s): VITAMINB12, FOLATE, FERRITIN, TIBC, IRON, RETICCTPCT in the last 72 hours.    Radiology Studies: I have reviewed all of the imaging during this hospital visit personally     Scheduled Meds:  aspirin EC  81 mg Oral Daily   atorvastatin  80 mg Oral Daily   carvedilol  6.25 mg Oral BID WC   Chlorhexidine Gluconate Cloth  6 each Topical Daily   clopidogrel  75 mg Oral Daily   docusate  sodium  100 mg Oral BID   enoxaparin (LOVENOX) injection  30 mg Subcutaneous Q24H   finasteride  5 mg Oral Daily   furosemide  80 mg Intravenous BID   insulin aspart  0-15 Units Subcutaneous TID WC   isosorbide-hydrALAZINE  1 tablet Oral TID   pantoprazole  40 mg Oral BID AC   sodium chloride flush  3 mL Intravenous Q12H   tamsulosin  0.8 mg Oral Daily   Continuous Infusions:   LOS: 0 days        Dray Dente Gerome Apley, MD

## 2022-01-01 NOTE — Plan of Care (Signed)
  Problem: Education: Goal: Ability to demonstrate management of disease process will improve Outcome: Progressing   Problem: Education: Goal: Ability to verbalize understanding of medication therapies will improve Outcome: Progressing   Problem: Education: Goal: Knowledge of General Education information will improve Description: Including pain rating scale, medication(s)/side effects and non-pharmacologic comfort measures Outcome: Progressing   

## 2022-01-01 NOTE — TOC Progression Note (Signed)
Transition of Care [facility]) - Progression Note    Patient Details  Name: Randy [name] MRN: [id number] Date of Birth: April 16, 1941  Transition of Care [facility]) CM/SW Contact  Zenon Mayo, RN Phone Number: 01/01/2022, 9:46 AM  Clinical Narrative:     Transition of Care (TOC) Screening Note   Patient Details  Name: Randy [name] Date of Birth: Dec 20, 1941   Transition [facility] Care [facility]) CM/SW Contact:    Zenon Mayo, RN Phone Number: 01/01/2022, 9:46 AM    Transition of Care Department [facility]) has reviewed patient and no TOC needs have been identified at this time. We will continue to monitor patient advancement through interdisciplinary progression rounds. If new patient transition needs arise, please place a TOC consult.          Expected Discharge Plan and Services                                                 Social Determinants of Health (SDOH) Interventions    Readmission Risk Interventions Readmission Risk Prevention Plan 12/13/2021  Transportation Screening Complete  Medication Review ([place]) Complete  PCP or Specialist appointment within 3-5 days of discharge Complete  HRI or [place] Complete  SW Recovery Care/Counseling Consult Complete  [place] Not Applicable  Some recent data might be hidden

## 2022-01-02 DIAGNOSIS — R739 Hyperglycemia, unspecified: Secondary | ICD-10-CM | POA: Diagnosis not present

## 2022-01-02 DIAGNOSIS — E782 Mixed hyperlipidemia: Secondary | ICD-10-CM | POA: Diagnosis not present

## 2022-01-02 DIAGNOSIS — I2510 Atherosclerotic heart disease of native coronary artery without angina pectoris: Secondary | ICD-10-CM | POA: Diagnosis not present

## 2022-01-02 DIAGNOSIS — I5023 Acute on chronic systolic (congestive) heart failure: Secondary | ICD-10-CM | POA: Diagnosis not present

## 2022-01-02 DIAGNOSIS — E440 Moderate protein-calorie malnutrition: Secondary | ICD-10-CM | POA: Insufficient documentation

## 2022-01-02 LAB — BASIC METABOLIC PANEL
Anion gap: 10 (ref 5–15)
BUN: 34 mg/dL — ABNORMAL HIGH (ref 8–23)
CO2: 25 mmol/L (ref 22–32)
Calcium: 8.1 mg/dL — ABNORMAL LOW (ref 8.9–10.3)
Chloride: 98 mmol/L (ref 98–111)
Creatinine, Ser: 2.24 mg/dL — ABNORMAL HIGH (ref 0.61–1.24)
GFR, Estimated: 29 mL/min — ABNORMAL LOW (ref >60)
Glucose, Bld: 98 mg/dL (ref 70–99)
Potassium: 3.5 mmol/L (ref 3.5–5.1)
Sodium: 133 mmol/L — ABNORMAL LOW (ref 135–145)

## 2022-01-02 LAB — URINE CULTURE: Culture: 90000 — AB

## 2022-01-02 LAB — GLUCOSE, CAPILLARY
Glucose-Capillary: 108 mg/dL — ABNORMAL HIGH (ref 70–99)
Glucose-Capillary: 146 mg/dL — ABNORMAL HIGH (ref 70–99)
Glucose-Capillary: 135 mg/dL — ABNORMAL HIGH (ref 70–99)

## 2022-01-02 MED ORDER — EMPAGLIFLOZIN 10 MG PO TABS
10.0000 mg | ORAL_TABLET | Freq: Every day | ORAL | Status: DC
  Administered 2022-01-02 – 2022-01-10 (×7): 10 mg via ORAL
  Filled 2022-01-02 (×8): qty 1

## 2022-01-02 MED ORDER — POTASSIUM CHLORIDE CRYS ER 20 MEQ PO TBCR
40.0000 meq | EXTENDED_RELEASE_TABLET | Freq: Once | ORAL | Status: AC
  Administered 2022-01-02: 40 meq via ORAL
  Filled 2022-01-02: qty 2

## 2022-01-02 MED ORDER — TORSEMIDE 20 MG PO TABS
40.0000 mg | ORAL_TABLET | Freq: Every day | ORAL | Status: DC
  Administered 2022-01-03 – 2022-01-10 (×6): 40 mg via ORAL
  Filled 2022-01-02 (×7): qty 2

## 2022-01-02 NOTE — Progress Notes (Signed)
Patient refused vital signs per nurse tech

## 2022-01-02 NOTE — Progress Notes (Incomplete)
Heart Failure Stewardship Pharmacist Progress Note   PCP: Jilda Panda, MD PCP-Cardiologist: Donato Heinz, MD    HPI:  ***  Current HF Medications: {HF MEDS HALPFXT:02409}  Prior to admission HF Medications: {HF MEDS PTA:26664}  Pertinent Lab Values: Serum creatinine ***, BUN ***, Potassium ***, Sodium ***, BNP ***, Magnesium ***, A1c ***, Digoxin ***   Vital Signs: Weight: *** lbs (admission weight: *** lbs) Blood pressure: ***  Heart rate: ***  I/O: -***L yesterday; net -***L ReDS reading: ***  Medication Assistance / Insurance Benefits Check: Does the patient have prescription insurance?  {Yes/No/Pending:24180} Type of insurance plan: ***  Does the patient qualify for medication assistance through manufacturers or grants?   {Yes/No/Pending:24180} Eligible grants and/or patient assistance programs: *** Medication assistance applications in progress: ***  Medication assistance applications approved: *** Approved medication assistance renewals will be completed by: ***  Outpatient Pharmacy:  Prior to admission outpatient pharmacy: *** Is the patient willing to use [place] at discharge? {Yes/No/Pending:24180} Is the patient willing to transition their outpatient pharmacy to utilize a [facility] outpatient pharmacy?   {Yes/No/Pending:24180}    Assessment: 1. ***Acute on chronic systolic CHF (EF ***), due to ***. NYHA class *** symptoms. -    Plan: 1) Medication changes recommended at this time: -  2) Patient assistance: -  3)  Education  - To be completed prior to discharge  Kerby Nora, PharmD, BCPS Heart Failure Stewardship Pharmacist Phone [phone]

## 2022-01-02 NOTE — Progress Notes (Signed)
PROGRESS NOTE    Randy [name]  NTI:[id number] DOB: 1941-03-23 DOA: 12/30/2021 PCP: Jilda Panda, MD    Brief Narrative:  Randy [name] was admitted to the hospital with the working diagnosis of acute on chronic systolic heart failure exacerbation.    81 yo male with the past medical history of chronic systolic heart failure, CAD, and HTN who presented with dyspnea. Limited history due to language barrier. Reported 24 hrs of dyspnea, and lower extremity edema. Positive suprapubic pain. On his initial physical examination his blood pressure was 125/74, HR 63, RR 21 and oxygen saturation 93%, lung with no rales or wheezing, heart with S1 and S2 present and rhythmic, abdomen soft, positive 3+ lower extremity edema.    Na 138, K 4,6, cl 109, bicarb 17, glucose 118, bun 29 and cr at 1,91 AST 82 and ALT 77 BNP >4,500  Wbc 10.1, hgb 11.9, hct 41,7 and plt 654 SARS COVID 19 negative   Ua with specific gravity 1.020, 0-5 wbc and >50 rbc    Chest radiograph with cardiomegaly and increase hilar vascular congestion, small left pleural effusion., (personally reviewed).    KG 74 bpm, with normal axis and normal intervals, sinus rhythm with no significant ST segment or T wave changes, low voltage.    Patient has been placed on furosemide for diuresis  Tolerating well diuresis, will need assistance with his medications at discharge.    Assessment & Plan:   Principal Problem:   Acute on chronic systolic (congestive) heart failure (HCC) Active Problems:   CAD, multiple vessel   Stage 3b chronic kidney disease ([place])   Mixed hyperlipidemia   Urinary retention due to benign prostatic hyperplasia   Hyperglycemia   Diarrhea   Heart failure (HCC)     Acute on chronic systolic heart failure exacerbation. Ischemic cardiomyopathy.  11/2021 echocardiogram with reduced LV systolic function EF 30 to 35% with global hypokinesis, RV systolic function preserved. Moderate pulmonary hypertension.  Not  candidate for revascularization per CT surgery 01/2021. (Old records personally reviewed). Improving volume status, but not yet back to baseline.  Urine output over last 24 hrs is 4,025 ml over last 24 hrs.    Continue with carvedilol and after load reduction with hydralazine and isosorbide.  Resume empagliflozin   Not on RAAS inhibition due to risk of worsening renal function  Continue with diuresis with furosemide, will transition to oral in am. Patient has difficulty getting his medications as outpatient due to cost.    2. T2DM dyslipidemia  Patient is tolerating po well, fasting glucose this am is 98, continue with insulin sliding scale for glucose cover and monitoring.  Resume empagliflozin  Continue with statin therapy.    3.  AKI on Stage 3 b CKD/ hyponatremia.  Stable renal function with serum cr at 2,24, K is 3,5 and Na is 133, plan to continue with furosemide for diuresis, change to oral in am.  Follow up renal function in am, avoid  hypotension   Add 40 meq Kcl today, keep K at 4  4. Urinary retention due to BPH. Continue with finesteride and tamsulosin. Plan for voiding trial today, remove foley catheter.    Status is: Inpatient  Remains inpatient appropriate because: diuresis and telemetry monitoring     DVT prophylaxis:  Enoxaparin   Code Status:    full  Family Communication:   No family at the bedside     Subjective: Patient with improvement in dyspnea and edema, not back yet to baseline, no  nausea or vomiting.   Objective: Vitals:   01/01/22 2145 01/02/22 0415 01/02/22 0432 01/02/22 0553  BP: 109/71  (!) 108/55 118/60  Pulse: (!) 58  (!) 57   Resp:   16   Temp:   97.8 F (36.6 C)   TempSrc:   Oral   SpO2: 97%  94%   Weight:  56.6 kg    Height:        Intake/Output Summary (Last 24 hours) at 01/02/2022 1329 Last data filed at 01/02/2022 2956 Gross per 24 hour  Intake 1030 ml  Output 3325 ml  Net -2295 ml   Filed Weights   12/31/21 1900  01/01/22 0016 01/02/22 0415  Weight: 60.5 kg 58.6 kg 56.6 kg    Examination:   General: Not in pain or dyspnea, deconditioned  Neurology: Awake and alert, non focal  E ENT: no pallor, no icterus, oral mucosa moist Cardiovascular: No JVD. S1-S2 present, rhythmic, no gallops, rubs, or murmurs. Trace bilateral lower extremity edema. Pulmonary: positive  breath sounds bilaterally, no wheezing, positive bilateral rales at bases Gastrointestinal. Abdomen soft and non tender Skin. No rashes Musculoskeletal: no joint deformities     Data Reviewed: I have personally reviewed following labs and imaging studies  CBC: Recent Labs  Lab 12/30/21 2058 01/01/22 0332  WBC 10.1 9.4  NEUTROABS 7.7  --   HGB 11.9* 10.9*  HCT 41.7 35.9*  MCV 81.1 77.4*  PLT 654* 213*   Basic Metabolic Panel: Recent Labs  Lab 12/30/21 2058 01/01/22 0332 01/02/22 0350  NA 138 139 133*  K 4.6 4.2 3.5  CL 109 105 98  CO2 17* 23 25  GLUCOSE 118* 121* 98  BUN 29* 32* 34*  CREATININE 1.91* 2.20* 2.24*  CALCIUM 8.9 8.4* 8.1*   GFR: Estimated Creatinine Clearance: 19.6 mL/min (A) (by C-G formula based on SCr of 2.24 mg/dL (H)). Liver Function Tests: Recent Labs  Lab 12/30/21 2058  AST 82*  ALT 77*  ALKPHOS 125  BILITOT 0.7  PROT 7.2  ALBUMIN 3.6   No results for input(s): LIPASE, AMYLASE in the last 168 hours. No results for input(s): AMMONIA in the last 168 hours. Coagulation Profile: No results for input(s): INR, PROTIME in the last 168 hours. Cardiac Enzymes: No results for input(s): CKTOTAL, CKMB, CKMBINDEX, TROPONINI in the last 168 hours. BNP (last 3 results) No results for input(s): PROBNP in the last 8760 hours. HbA1C: No results for input(s): HGBA1C in the last 72 hours. CBG: Recent Labs  Lab 01/01/22 0604 01/01/22 1108 01/01/22 1621 01/01/22 2115 01/02/22 0548  GLUCAP 144* 122* 137* 143* 108*   Lipid Profile: No results for input(s): CHOL, HDL, LDLCALC, TRIG, CHOLHDL,  LDLDIRECT in the last 72 hours. Thyroid Function Tests: No results for input(s): TSH, T4TOTAL, FREET4, T3FREE, THYROIDAB in the last 72 hours. Anemia Panel: No results for input(s): VITAMINB12, FOLATE, FERRITIN, TIBC, IRON, RETICCTPCT in the last 72 hours.    Radiology Studies: I have reviewed all of the imaging during this hospital visit personally     Scheduled Meds:  aspirin EC  81 mg Oral Daily   atorvastatin  80 mg Oral Daily   carvedilol  6.25 mg Oral BID WC   Chlorhexidine Gluconate Cloth  6 each Topical Daily   clopidogrel  75 mg Oral Daily   docusate sodium  100 mg Oral BID   enoxaparin (LOVENOX) injection  30 mg Subcutaneous Q24H   finasteride  5 mg Oral Daily   furosemide  80  mg Intravenous BID   insulin aspart  0-15 Units Subcutaneous TID WC   isosorbide-hydrALAZINE  1 tablet Oral TID   pantoprazole  40 mg Oral BID AC   sodium chloride flush  3 mL Intravenous Q12H   tamsulosin  0.8 mg Oral Daily   Continuous Infusions:   LOS: 1 day        Ismahan Lippman Gerome Apley, MD

## 2022-01-02 NOTE — TOC CM/SW Note (Signed)
HF TOC CM spoke to pt at bedside. Attempted to use interpreter services but unable to effectively communicate with pt per Unit RN with device. Contacted pt's dtr, Nori and son, [name] on 3 way call with patient. They were able to communicate and translate for patient. Pt unclear of medications he takes and name of [place] he resides. Unable to provide a contact person, or address. States he does not know how much is in his bank account. He receives a check that goes to [place] each month. Dtr and Son states pt was living with their youngest sister who passed away in 2021/09/14. When pt was living with youngest dtr, pt was having difficulty with remembering and would wander from home. Dtr would set up meds in pill planner and pt would forget to take meds. Dtr, [name] states they have tried to move pt to [place] and live with her and husband. Pt lived with her temporarily but was adamant about leaving. Son lives in [place]. Pt agreed with both son and dtr on phone to Memory Care/ALF. Dtr/Son will seek to get HCPOA/Durable POA to assist with getting Memory Care arranged. Provided email Denny Peon- [email or web address], and Khan- [email or web address]. Attempted call [place] # [phone], left message for return call.   Updated attending about Memory Care/ALF. Yonah, Heart Failure TOC CM [phone]

## 2022-01-02 NOTE — Progress Notes (Signed)
Patient unable to tell RN if he has urinated since foley was removed. Bladder scan showed 116 mls. Will continue to assess

## 2022-01-02 NOTE — Progress Notes (Signed)
Patient refused blood glucose check. "You are taking all my blood and that is why I'm dizzy." RN educated patient via interpretation services

## 2022-01-02 NOTE — NC FL2 (Signed)
[place] MEDICAID FL2 LEVEL OF CARE SCREENING TOOL     IDENTIFICATION  Patient Name: Randy [name] Birthdate: 01-Jul-1941 Sex: male Admission Date (Current Location): 12/30/2021  [place] and [place] Number:  Kathleen Argue [id number] [place] and Address:  [facility] [place], [place] [address] [place], [place] [address]      Provider Number: [id number]  Attending Physician Name and Address:  Tawni Millers,*  Relative Name and Phone Number:  Harlene Ramus #[phone], HYWV PXTGGYIRS [phone]    Current Level of Care: Hospital Recommended Level of Care: Memory Care Prior Approval Number:    Date Approved/Denied:   PASRR Number:    Discharge Plan: Other (Comment) (Memory Care)    Current Diagnoses: Patient Active Problem List   Diagnosis Date Noted   Heart failure ([place]) 01/01/2022   Acute on chronic systolic (congestive) heart failure ([place]) 12/31/2021   Diarrhea 12/31/2021   Ischemic cardiomyopathy 12/14/2021   CHF exacerbation ([place]) 12/12/2021   SOB (shortness of breath) 11/22/2021   Elevated troponin 11/22/2021   Hyperglycemia 11/22/2021   Blood creatinine increased compared with prior measurement 11/22/2021   Acute respiratory failure with hypoxia and hypercapnia ([place]) 11/21/2021   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   Urinary tract infection 11/16/2021   Microcytic anemia 11/16/2021   Thrombocytosis 11/16/2021   Bradycardia 07/05/2021   Hematuria 07/05/2021   Anemia 07/05/2021   Acute on chronic systolic CHF (congestive heart failure) ([place]) 07/01/2021   CAD, 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   Stage 3b chronic kidney disease ([place])    Primary hypertension    Mixed hyperlipidemia    AKI-CKD 3B    NSTEMI (non-ST elevated myocardial infarction) ([place])    Hypertensive crisis 01/29/2021   Acute respiratory failure with hypoxia ([place]) 01/29/2021    Orientation RESPIRATION BLADDER Height & Weight     Self  Normal  Continent Weight: 56.6 kg (scale a) Height:  5\' 2"  (157.5 cm)  BEHAVIORAL SYMPTOMS/MOOD NEUROLOGICAL BOWEL NUTRITION STATUS      Continent Diet (Heart Healthy)  AMBULATORY STATUS COMMUNICATION OF NEEDS Skin   Independent Verbally Normal                       Personal Care Assistance Level of Assistance  Bathing, Feeding, Dressing Bathing Assistance: Independent Feeding assistance: Independent Dressing Assistance: Independent     Functional Limitations Info  Sight, Hearing, Speech Sight Info: Impaired (glasses) Hearing Info: Adequate Speech Info: Adequate (Laotian)    SPECIAL CARE FACTORS FREQUENCY                       Contractures Contractures Info: Not present    Additional Factors Info  Code Status, Allergies Code Status Info: Full Allergies Info: No Known Allergies           Current Medications (01/02/2022):  This is the current hospital active medication list Current Facility-Administered Medications  Medication Dose Route Frequency Provider Last Rate Last Admin   acetaminophen (TYLENOL) tablet 650 mg  650 mg Oral Q6H PRN Karmen Bongo, MD   650 mg at 01/02/22 0557   Or   acetaminophen (TYLENOL) suppository 650 mg  650 mg Rectal Q6H PRN Karmen Bongo, MD       aspirin EC tablet 81 mg  81 mg Oral Daily Karmen Bongo, MD   81 mg at 01/01/22 0827   atorvastatin (LIPITOR) tablet 80 mg  80 mg Oral Daily Karmen Bongo, MD   80 mg at  01/01/22 0827   bisacodyl (DULCOLAX) EC tablet 5 mg  5 mg Oral Daily PRN Karmen Bongo, MD       carvedilol (COREG) tablet 6.25 mg  6.25 mg Oral BID WC Karmen Bongo, MD   6.25 mg at 01/02/22 1605   Chlorhexidine Gluconate Cloth 2 % PADS 6 each  6 each Topical Daily Karmen Bongo, MD   6 each at 01/01/22 1003   clopidogrel (PLAVIX) tablet 75 mg  75 mg Oral Daily Karmen Bongo, MD   75 mg at 01/01/22 0712   docusate sodium (COLACE) capsule 100 mg  100 mg Oral BID Karmen Bongo, MD   100 mg at 01/01/22 2142    empagliflozin (JARDIANCE) tablet 10 mg  10 mg Oral Daily Arrien, Jimmy Picket, MD   10 mg at 01/02/22 1411   enoxaparin (LOVENOX) injection 30 mg  30 mg Subcutaneous Q24H Karmen Bongo, MD   30 mg at 01/01/22 2143   finasteride (PROSCAR) tablet 5 mg  5 mg Oral Daily Karmen Bongo, MD   5 mg at 01/01/22 0827   insulin aspart (novoLOG) injection 0-15 Units  0-15 Units Subcutaneous TID [facility] Karmen Bongo, MD   2 Units at 01/02/22 1610   isosorbide-hydrALAZINE (BIDIL) 20-37.5 MG per tablet 1 tablet  1 tablet Oral TID Karmen Bongo, MD   1 tablet at 01/02/22 1411   ondansetron (ZOFRAN) tablet 4 mg  4 mg Oral Q6H PRN Karmen Bongo, MD       Or   ondansetron [facility]) injection 4 mg  4 mg Intravenous Q6H PRN Karmen Bongo, MD       pantoprazole (PROTONIX) EC tablet 40 mg  40 mg Oral BID Meliton Rattan, MD   40 mg at 01/02/22 1605   polyethylene glycol (MIRALAX / GLYCOLAX) packet 17 g  17 g Oral Daily PRN Karmen Bongo, MD       sodium chloride flush (NS) 0.9 % injection 3 mL  3 mL Intravenous Q12H Karmen Bongo, MD   3 mL at 01/01/22 2207   tamsulosin (FLOMAX) capsule 0.8 mg  0.8 mg Oral Daily Karmen Bongo, MD   0.8 mg at 01/01/22 0827   [START ON 01/03/2022] torsemide (DEMADEX) tablet 40 mg  40 mg Oral Daily Arrien, Jimmy Picket, MD       traZODone (DESYREL) tablet 25 mg  25 mg Oral QHS PRN Karmen Bongo, MD         Discharge Medications: Please see discharge summary for a list of discharge medications.  Relevant Imaging Results:  Relevant Lab Results:   Additional Information Social Security # [phone]  Laquasia Pincus, Rexene Alberts, RN

## 2022-01-02 NOTE — TOC Initial Note (Addendum)
Transition of Care [facility]) - Initial/Assessment Note    Patient Details  Name: Randy [name] MRN: [id number] Date of Birth: [birth year]  Transition of Care [facility]) CM/SW Contact:    Zenon Mayo, RN Phone Number: 01/02/2022, 10:48 AM  Clinical Narrative:                 [facility] is from [facility] [place] [facility], he speaks [place], he was set up with [facility] on the last hospitalization for [facility], [place].  This NCM spoke with Malachy Mood with [facility] to let her know he is here.  She state the last time they went out to see him , he was not [facility] the [facility] and had left and went somewhere but she states they will keep trying to see him.  NCM spoke with patient, he states he is being discharged today, NCM informed him that [facility] will still come back out to see him , does he want that, he states ok.  He states he needs transportation at discharge. Heart Failure team will follow patient, to try to place for memory care.     Expected Discharge Plan: [place] Barriers to Discharge: Continued Medical Work up   Patient Goals and CMS Choice Patient states their goals for this hospitalization and ongoing recovery are:: return to [facility].gov Compare Post Acute Care list provided to:: Patient Choice offered to / list presented to : Patient  Expected Discharge Plan and Services Expected Discharge Plan: [place] In-house Referral: NA Discharge Planning Services: CM Consult Post Acute Care Choice: Resumption of Svcs/PTA Provider Living arrangements for the past 2 months: Homeless                   DME Agency: NA       HH Arranged: RN, PT, Disease Management [place] Agency: [place] Date [place]: 01/02/22 Time HH Agency Contacted: 66 Representative spoke with at [place]: Malachy Mood  Prior Living Arrangements/Services Living arrangements for the past 2 months: Homeless Lives with::  Other (Comment) ([facility] [place]) Patient language and need for interpreter reviewed:: Yes Do you feel safe going back to the place where you live?: Yes      Need for Family Participation in Patient Care: No (Comment) Care giver support system in place?: No (comment) Current home services: Home RN, Home PT Criminal Activity/Legal Involvement Pertinent to Current Situation/Hospitalization: No - Comment as needed  Activities of Daily Living      Permission Sought/Granted                  Emotional Assessment Appearance:: Appears stated age Attitude/Demeanor/Rapport: Engaged Affect (typically observed): Appropriate Orientation: : Oriented to Self, Oriented to Place, Oriented to  Time, Oriented to Situation Alcohol / Substance Use: Not Applicable Psych Involvement: No (comment)  Admission diagnosis:  Acute on chronic systolic (congestive) heart failure ([place]) [I50.23] Heart failure ([place]) [I50.9] Patient Active Problem List   Diagnosis Date Noted   Heart failure ([place]) 01/01/2022   Acute on chronic systolic (congestive) heart failure ([place]) 12/31/2021   Diarrhea 12/31/2021   Ischemic cardiomyopathy 12/14/2021   CHF exacerbation ([place]) 12/12/2021   SOB (shortness of breath) 11/22/2021   Elevated troponin 11/22/2021   Hyperglycemia 11/22/2021   Blood creatinine increased compared with prior measurement 11/22/2021   Acute respiratory failure with hypoxia and hypercapnia ([place]) 11/21/2021   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   Urinary tract  infection 11/16/2021   Microcytic anemia 11/16/2021   Thrombocytosis 11/16/2021   Bradycardia 07/05/2021   Hematuria 07/05/2021   Anemia 07/05/2021   Acute on chronic systolic CHF (congestive heart failure) ([place]) 07/01/2021   CAD, 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   Stage 3b chronic kidney disease ([place])    Primary hypertension    Mixed hyperlipidemia    AKI-CKD 3B    NSTEMI (non-ST elevated myocardial infarction)  ([place])    Hypertensive crisis 01/29/2021   Acute respiratory failure with hypoxia ([place]) 01/29/2021   PCP:  Jilda Panda, MD Pharmacy:   [facility] [place], [address] AT [address] [place] [address] Phone: [phone] Fax: [phone]  [facility] [address] [place] [address] Phone: [phone] Fax: [phone]     Social Determinants of Health (SDOH) Interventions    Readmission Risk Interventions Readmission Risk Prevention Plan 01/02/2022 12/13/2021  Transportation Screening Complete Complete  Medication Review ([place]) Complete Complete  PCP or Specialist appointment within 3-5 days of discharge Complete Complete  HRI or [place] Complete Complete  SW Recovery Care/Counseling Consult Complete Complete  Palliative Care Screening Not Applicable Not [place] Not Applicable Not Applicable  Some recent data might be hidden

## 2022-01-02 NOTE — TOC Progression Note (Signed)
Transition of Care [facility]) - Progression Note    Patient Details  Name: Randy [name] MRN: [id number] Date of Birth: 08-Aug-1941  Transition of Care [facility]) CM/SW Contact  Zenon Mayo, RN Phone Number: 01/02/2022, 10:31 AM  Clinical Narrative:    [facility] is from [facility] [place] [facility], he was set up with [facility] on the last hospitalization for [facility], [place].  This NCM spoke with Malachy Mood with [facility] to let her know he is here.  She state the last time they went out to see him , he was not [facility] and had left and went somewhere but she states they will keep trying to see him.  NCM spoke with patient, he states he is being discharged today, NCM informed him that [facility] will still come back out to see him , he states ok.  He states he needs transportation at discharge.         Expected Discharge Plan and Services                                                 Social Determinants of Health (SDOH) Interventions    Readmission Risk Interventions Readmission Risk Prevention Plan 12/13/2021  Transportation Screening Complete  Medication Review ([place]) Complete  PCP or Specialist appointment within 3-5 days of discharge Complete  HRI or [place] Complete  SW Recovery Care/Counseling Consult Complete  [place] Not Applicable  Some recent data might be hidden

## 2022-01-02 NOTE — Progress Notes (Signed)
RN spent time with the patient communicating with [place] interpreter services. I attempted to educate the patient on his situation and plan of care from nursing's perspective. There seems to be a lack of patient understanding his condition and treatment plan. Patient states that he wants to go home and that we have not helped his pain

## 2022-01-02 NOTE — Progress Notes (Signed)
Upon assessment this morning, patient is adamant about going home. He declined all morning medicine, foley care, and CHG wipes. RN educated patient and notified MD.  Edwena Blow, RN

## 2022-01-02 NOTE — Progress Notes (Signed)
Foley catheter removed at 0930 per order for voiding trial Edwena Blow, RN

## 2022-01-02 NOTE — Progress Notes (Signed)
Initial Nutrition Assessment  DOCUMENTATION CODES:   Non-severe (moderate) malnutrition in context of chronic illness  INTERVENTION:  - Encourage PO intake - Recommend diet liberalization from heart healthy to 2gm sodium to provide more options for nutritional adequacy; spoke with MD who agrees - Ensure Enlive po once daily, each supplement provides 350 kcal and 20 grams of protein - Discussed with pt importance of a low sodium diet to aid in preventing fluid retention  NUTRITION DIAGNOSIS:   Moderate Malnutrition related to chronic illness (CHF) as evidenced by moderate fat depletion, severe muscle depletion.  GOAL:   Patient will meet greater than or equal to 90% of their needs  MONITOR:   PO intake, Supplement acceptance, Diet advancement, Labs, Weight trends, I & O's  REASON FOR ASSESSMENT:   Consult Assessment of nutrition requirement/status  ASSESSMENT:   Pt admitted SOB secondary to acute on chronic CHF. Pt with previous hospital admissions 12/1-12/8 and 12/21-12/24 for decompensated CHF, NSTEMI, CAP and recurrent acute CHF.  Spoke with pt utilizing in-person interpreter.  Pt reports that he is not eating as much as he usually does at home d/t the food not being culturally what he is used to. Noted meal completions of 75-100% of meals. He typically eats breakfast, a midday snack, and dinner. Although it's unclear what his typical meals consist of as he continued to list what he has been eating during admission. He has not had meat during admission as he is concerned with eating too much and gaining weight. Discussed with pt the importance of protein for prevention of additional muscle losses and an overall balanced diet. We also discussed importance of low sodium diet to prevent fluid retention. He did mention that he usually eats vegetables, rice and meat at home. Pt does not typically drink Ensure but is willing to receive one during admission to help enhance nutritional  adequacy. He denies difficulty chewing or swallowing.  Pt reports a usual body weight of 106 lbs and reports having recent weight loss as he "eats less during the season changes." Pt with a 7% weight loss within the past 6 months.   Medications: colace, jardiance, SSI, protonix, torsemide  Labs: sodium 133, BUN 34, Cr 2.24  HgbA1c 6.7%, CBG's 108-144 x24 hours  UOP: 4L x24 hours I/O's: -4.3L since admission  NUTRITION - FOCUSED PHYSICAL EXAM:  Flowsheet Row Most Recent Value  Orbital Region Moderate depletion  Upper Arm Region Moderate depletion  Thoracic and Lumbar Region Moderate depletion  Buccal Region Moderate depletion  [place] Region Mild depletion  Clavicle Bone Region Severe depletion  Clavicle and Acromion Bone Region Severe depletion  Scapular Bone Region Moderate depletion  Dorsal Hand Moderate depletion  Patellar Region Severe depletion  Anterior Thigh Region Severe depletion  Posterior Calf Region Severe depletion  Edema (RD Assessment) Mild  Hair Reviewed  Eyes Reviewed  Mouth Other (Comment)  [poor dentition]  Skin Reviewed  Nails Reviewed       Diet Order:   Diet Order             Diet regular Room service appropriate? Yes; Fluid consistency: Thin; Fluid restriction: 1500 mL Fluid  Diet effective now                   EDUCATION NEEDS:   Education needs have been addressed  Skin:  Skin Assessment: Reviewed RN Assessment  Last BM:  1/10  Height:   Ht Readings from Last 1 Encounters:  12/31/21 5\' 2"  (1.575 m)  Weight:   Wt Readings from Last 1 Encounters:  01/02/22 56.6 kg    BMI:  Body mass index is 22.82 kg/m.  Estimated Nutritional Needs:   Kcal:  1600-1800  Protein:  80-95g  Fluid:  >/=1.6L  Clayborne Dana, RDN, LDN Clinical Nutrition

## 2022-01-03 DIAGNOSIS — E782 Mixed hyperlipidemia: Secondary | ICD-10-CM | POA: Diagnosis not present

## 2022-01-03 DIAGNOSIS — E440 Moderate protein-calorie malnutrition: Secondary | ICD-10-CM

## 2022-01-03 DIAGNOSIS — I2510 Atherosclerotic heart disease of native coronary artery without angina pectoris: Secondary | ICD-10-CM | POA: Diagnosis not present

## 2022-01-03 DIAGNOSIS — I5023 Acute on chronic systolic (congestive) heart failure: Secondary | ICD-10-CM | POA: Diagnosis not present

## 2022-01-03 LAB — GLUCOSE, CAPILLARY
Glucose-Capillary: 130 mg/dL — ABNORMAL HIGH (ref 70–99)
Glucose-Capillary: 144 mg/dL — ABNORMAL HIGH (ref 70–99)

## 2022-01-03 MED ORDER — ENSURE ENLIVE PO LIQD
237.0000 mL | ORAL | Status: DC
  Administered 2022-01-03 – 2022-01-09 (×6): 237 mL via ORAL

## 2022-01-03 MED ORDER — PANTOPRAZOLE SODIUM 40 MG PO TBEC
40.0000 mg | DELAYED_RELEASE_TABLET | Freq: Every day | ORAL | Status: DC
  Administered 2022-01-04 – 2022-01-10 (×5): 40 mg via ORAL
  Filled 2022-01-03 (×6): qty 1

## 2022-01-03 NOTE — Progress Notes (Signed)
PROGRESS NOTE    Alecxis Helms  LNL:[id number] DOB: December 15, 1941 DOA: 12/30/2021 PCP: Jilda Panda, MD    Brief Narrative:  Mr. Klarich was admitted to the hospital with the working diagnosis of acute on chronic systolic heart failure exacerbation.    81 yo male with the past medical history of chronic systolic heart failure, CAD, and HTN who presented with dyspnea. Limited history due to language barrier. Reported 24 hrs of dyspnea, and lower extremity edema. Positive suprapubic pain. On his initial physical examination his blood pressure was 125/74, HR 63, RR 21 and oxygen saturation 93%, lung with no rales or wheezing, heart with S1 and S2 present and rhythmic, abdomen soft, positive 3+ lower extremity edema.    Na 138, K 4,6, cl 109, bicarb 17, glucose 118, bun 29 and cr at 1,91 AST 82 and ALT 77 BNP >4,500  Wbc 10.1, hgb 11.9, hct 41,7 and plt 654 SARS COVID 19 negative   Ua with specific gravity 1.020, 0-5 wbc and >50 rbc    Chest radiograph with cardiomegaly and increase hilar vascular congestion, small left pleural effusion., (personally reviewed).    KG 74 bpm, with normal axis and normal intervals, sinus rhythm with no significant ST segment or T wave changes, low voltage.    Patient has been placed on furosemide for diuresis   Tolerating well diuresis, will need assistance with his medications at discharge.    Patient with poor outpatient support, plan to transfer to memory unit.     Assessment & Plan:   Principal Problem:   Acute on chronic systolic (congestive) heart failure (HCC) Active Problems:   CAD, multiple vessel   Stage 3b chronic kidney disease ([place])   Mixed hyperlipidemia   Urinary retention due to benign prostatic hyperplasia   Hyperglycemia   Diarrhea   Heart failure (HCC)   Malnutrition of moderate degree   Acute on chronic systolic heart failure exacerbation. Ischemic cardiomyopathy.  11/2021 echocardiogram with reduced LV systolic function  EF 30 to 35% with global hypokinesis, RV systolic function preserved. Moderate pulmonary hypertension.  Not candidate for revascularization per CT surgery 01/2021. (Old records personally reviewed).  Clinically patient has achieved euvolemia.  Continue diuresis with torsemide.  Heart failure management with carvedilol, hydralazine - isosorbide and empagliflozin   Not on RAAS inhibition due to risk of worsening renal function   Ischemic cardiomyopathy, continue with aspirin and clopidogrel.  Atorvastatin.  Patient has been chest pain free.   Patient declined morning labs    2. T2DM dyslipidemia  Patient is tolerating po well, his capillary glucose has been controlled 108, 146, 135 and 130 Plan to discontinue insulin and continue capillary glucose monitoring as needed.   On statin therapy   3.  AKI on Stage 3 b CKD/ hyponatremia.  Patient declined morning labs today, will follow renal function and electrolytes in am.  4. Urinary retention due to BPH.  On finesteride and tamsulosin. Successfully removed foley cathter   5. Moderate calorie protein malnutrition. Continue with nutritional supplementation   Status is: Inpatient  Remains inpatient appropriate because: pending placement    DVT prophylaxis: Enoxaparin   Code Status:    full  Family Communication:   No family at the bedside      Nutrition Status: Nutrition Problem: Moderate Malnutrition Etiology: chronic illness (CHF) Signs/Symptoms: moderate fat depletion, severe muscle depletion Interventions: Ensure Enlive (each supplement provides 350kcal and 20 grams of protein), Liberalize Diet     Subjective: Patient has been out of  bed, dyspnea and lower extremity edema continue to improve,   Objective: Vitals:   01/02/22 1954 01/03/22 0459 01/03/22 0854 01/03/22 1119  BP: 125/67 135/60 (!) 125/59 140/78  Pulse:    60  Resp: 15 16  18   Temp: 97.8 F (36.6 C) 98 F (36.7 C)  97.8 F (36.6 C)  TempSrc: Oral  Oral  Oral  SpO2: 99% 96%  99%  Weight:  56.7 kg    Height:        Intake/Output Summary (Last 24 hours) at 01/03/2022 1344 Last data filed at 01/03/2022 1316 Gross per 24 hour  Intake 1320 ml  Output 450 ml  Net 870 ml   Filed Weights   01/01/22 0016 01/02/22 0415 01/03/22 0459  Weight: 58.6 kg 56.6 kg 56.7 kg    Examination:   General: Not in dyspnea  Neurology: Awake and alert, non focal  E ENT: no pallor, no icterus, oral mucosa moist Cardiovascular: No JVD. S1-S2 present, rhythmic, no gallops, rubs, or murmurs. Trace non pitting bilateral lower extremity edema. Pulmonary:positive breath sounds bilaterally, adequate air movement, no wheezing, rhonchi or rales. Gastrointestinal. Abdomen soft and non tender Skin. No rashes Musculoskeletal: no joint deformities     Data Reviewed: I have personally reviewed following labs and imaging studies  CBC: Recent Labs  Lab 12/30/21 2058 01/01/22 0332  WBC 10.1 9.4  NEUTROABS 7.7  --   HGB 11.9* 10.9*  HCT 41.7 35.9*  MCV 81.1 77.4*  PLT 654* 384*   Basic Metabolic Panel: Recent Labs  Lab 12/30/21 2058 01/01/22 0332 01/02/22 0350  NA 138 139 133*  K 4.6 4.2 3.5  CL 109 105 98  CO2 17* 23 25  GLUCOSE 118* 121* 98  BUN 29* 32* 34*  CREATININE 1.91* 2.20* 2.24*  CALCIUM 8.9 8.4* 8.1*   GFR: Estimated Creatinine Clearance: 19.6 mL/min (A) (by C-G formula based on SCr of 2.24 mg/dL (H)). Liver Function Tests: Recent Labs  Lab 12/30/21 2058  AST 82*  ALT 77*  ALKPHOS 125  BILITOT 0.7  PROT 7.2  ALBUMIN 3.6   No results for input(s): LIPASE, AMYLASE in the last 168 hours. No results for input(s): AMMONIA in the last 168 hours. Coagulation Profile: No results for input(s): INR, PROTIME in the last 168 hours. Cardiac Enzymes: No results for input(s): CKTOTAL, CKMB, CKMBINDEX, TROPONINI in the last 168 hours. BNP (last 3 results) No results for input(s): PROBNP in the last 8760 hours. HbA1C: No results for  input(s): HGBA1C in the last 72 hours. CBG: Recent Labs  Lab 01/01/22 2115 01/02/22 0548 01/02/22 1606 01/02/22 2126 01/03/22 0607  GLUCAP 143* 108* 146* 135* 130*   Lipid Profile: No results for input(s): CHOL, HDL, LDLCALC, TRIG, CHOLHDL, LDLDIRECT in the last 72 hours. Thyroid Function Tests: No results for input(s): TSH, T4TOTAL, FREET4, T3FREE, THYROIDAB in the last 72 hours. Anemia Panel: No results for input(s): VITAMINB12, FOLATE, FERRITIN, TIBC, IRON, RETICCTPCT in the last 72 hours.    Radiology Studies: I have reviewed all of the imaging during this hospital visit personally     Scheduled Meds:  aspirin EC  81 mg Oral Daily   atorvastatin  80 mg Oral Daily   carvedilol  6.25 mg Oral BID WC   clopidogrel  75 mg Oral Daily   docusate sodium  100 mg Oral BID   empagliflozin  10 mg Oral Daily   enoxaparin (LOVENOX) injection  30 mg Subcutaneous Q24H   finasteride  5 mg Oral  Daily   insulin aspart  0-15 Units Subcutaneous TID WC   isosorbide-hydrALAZINE  1 tablet Oral TID   pantoprazole  40 mg Oral BID AC   sodium chloride flush  3 mL Intravenous Q12H   tamsulosin  0.8 mg Oral Daily   torsemide  40 mg Oral Daily   Continuous Infusions:   LOS: 2 days        Flynt Breeze Gerome Apley, MD

## 2022-01-03 NOTE — Progress Notes (Signed)
Pt was standing in the hallway and when asked what's wrong he stated he wants to go home. Explained to patient that he does not have discharge orders yet. Pt refused to let staff get his accuchecks today. He also refused 4 PM medications. When trying to fix patient's telemetry lead he grabbed the butter knife and tried to cut his gown with it.

## 2022-01-03 NOTE — TOC CM/SW Note (Signed)
HF TOC CM contacted dtr, Nori. States her brother will be handling HCPOA and ALF/Memory Care. Spoke to pt's son and he has received list of different ALF in area for him to review. States he will review and get back with CM and he will work on getting paperwork started for [facility]. [place], Heart Failure TOC CM [phone]

## 2022-01-03 NOTE — Plan of Care (Signed)
  Problem: Education: Goal: Ability to demonstrate management of disease process will improve Outcome: Progressing Goal: Ability to verbalize understanding of medication therapies will improve Outcome: Progressing   

## 2022-01-03 NOTE — Progress Notes (Signed)
Pt refused morning labs.  

## 2022-01-03 NOTE — Progress Notes (Incomplete)
Heart Failure Stewardship Pharmacist Progress Note   PCP: Jilda Panda, MD PCP-Cardiologist: Donato Heinz, MD    HPI:  ***  Current HF Medications: {HF MEDS [id number]:20601}  Prior to admission HF Medications: {HF MEDS PTA:26664}  Pertinent Lab Values: Serum creatinine ***, BUN ***, Potassium ***, Sodium ***, BNP ***, Magnesium ***, A1c ***, Digoxin ***   Vital Signs: Weight: *** lbs (admission weight: *** lbs) Blood pressure: ***  Heart rate: ***  I/O: -***L yesterday; net -***L ReDS reading: ***  Medication Assistance / Insurance Benefits Check: Does the patient have prescription insurance?  {Yes/No/Pending:24180} Type of insurance plan: ***  Does the patient qualify for medication assistance through manufacturers or grants?   {Yes/No/Pending:24180} Eligible grants and/or patient assistance programs: *** Medication assistance applications in progress: ***  Medication assistance applications approved: *** Approved medication assistance renewals will be completed by: ***  Outpatient Pharmacy:  Prior to admission outpatient pharmacy: *** Is the patient willing to use [facility] at discharge? {Yes/No/Pending:24180} Is the patient willing to transition their outpatient pharmacy to utilize a [facility] outpatient pharmacy?   {Yes/No/Pending:24180}    Assessment: 1. ***Acute on chronic systolic CHF (EF ***), due to ***. NYHA class *** symptoms. -    Plan: 1) Medication changes recommended at this time: -  2) Patient assistance: -  3)  Education  - To be completed prior to discharge  Kerby Nora, PharmD, BCPS Heart Failure Stewardship Pharmacist Phone [phone] '

## 2022-01-04 DIAGNOSIS — E440 Moderate protein-calorie malnutrition: Secondary | ICD-10-CM | POA: Diagnosis not present

## 2022-01-04 DIAGNOSIS — E782 Mixed hyperlipidemia: Secondary | ICD-10-CM | POA: Diagnosis not present

## 2022-01-04 DIAGNOSIS — R739 Hyperglycemia, unspecified: Secondary | ICD-10-CM | POA: Diagnosis not present

## 2022-01-04 DIAGNOSIS — I5023 Acute on chronic systolic (congestive) heart failure: Secondary | ICD-10-CM | POA: Diagnosis not present

## 2022-01-04 LAB — BASIC METABOLIC PANEL
Anion gap: 12 (ref 5–15)
BUN: 33 mg/dL — ABNORMAL HIGH (ref 8–23)
CO2: 24 mmol/L (ref 22–32)
Calcium: 8.1 mg/dL — ABNORMAL LOW (ref 8.9–10.3)
Chloride: 102 mmol/L (ref 98–111)
Creatinine, Ser: 2 mg/dL — ABNORMAL HIGH (ref 0.61–1.24)
GFR, Estimated: 33 mL/min — ABNORMAL LOW (ref >60)
Glucose, Bld: 136 mg/dL — ABNORMAL HIGH (ref 70–99)
Potassium: 3.4 mmol/L — ABNORMAL LOW (ref 3.5–5.1)
Sodium: 138 mmol/L (ref 135–145)

## 2022-01-04 LAB — GLUCOSE, CAPILLARY: Glucose-Capillary: 130 mg/dL — ABNORMAL HIGH (ref 70–99)

## 2022-01-04 MED ORDER — POTASSIUM CHLORIDE CRYS ER 20 MEQ PO TBCR
40.0000 meq | EXTENDED_RELEASE_TABLET | Freq: Once | ORAL | Status: DC
  Filled 2022-01-04: qty 2

## 2022-01-04 NOTE — Progress Notes (Addendum)
Pt woke up and started getting agitate, wanted to [facility], he wanted to talk to his daughter, daughter was called. Pt refused medications and VS. MD was notified. Tele has been DC per doctors order to reduce factors that can agitate pt. Anxiety med was also requested. Will continue to monitor pt.

## 2022-01-04 NOTE — Progress Notes (Signed)
PROGRESS NOTE    Randy [name]  CBS:[id number] DOB: 03-06-1941 DOA: 12/30/2021 PCP: Randy Panda, MD    Brief Narrative:  Mr. Randy [name] was admitted to the hospital with the working diagnosis of acute on chronic systolic heart failure exacerbation.    81 yo male with the past medical history of chronic systolic heart failure, CAD, and HTN who presented with dyspnea. Limited history due to language barrier. Reported 24 hrs of dyspnea, and lower extremity edema. Positive suprapubic pain. On his initial physical examination his blood pressure was 125/74, HR 63, RR 21 and oxygen saturation 93%, lung with no rales or wheezing, heart with S1 and S2 present and rhythmic, abdomen soft, positive 3+ lower extremity edema.    Na 138, K 4,6, cl 109, bicarb 17, glucose 118, bun 29 and cr at 1,91 AST 82 and ALT 77 BNP >4,500  Wbc 10.1, hgb 11.9, hct 41,7 and plt 654 SARS COVID 19 negative   Ua with specific gravity 1.020, 0-5 wbc and >50 rbc    Chest radiograph with cardiomegaly and increase hilar vascular congestion, small left pleural effusion., (personally reviewed).    KG 74 bpm, with normal axis and normal intervals, sinus rhythm with no significant ST segment or T wave changes, low voltage.    Patient has been placed on furosemide for diuresis   Tolerating well diuresis, will need assistance with his medications at discharge.    Patient with poor outpatient support, plan to transfer to memory unit.     Assessment & Plan:   Principal Problem:   Acute on chronic systolic (congestive) heart failure (HCC) Active Problems:   CAD, multiple vessel   Stage 3b chronic kidney disease ([place])   Mixed hyperlipidemia   Urinary retention due to benign prostatic hyperplasia   Hyperglycemia   Diarrhea   Heart failure (HCC)   Malnutrition of moderate degree   Acute on chronic systolic heart failure exacerbation. Ischemic cardiomyopathy.  11/2021 echocardiogram with reduced LV systolic function  EF 30 to 35% with global hypokinesis, RV systolic function preserved. Moderate pulmonary hypertension.  Not candidate for revascularization per CT surgery 01/2021. (Old records personally reviewed).   Medical therapy with carvedilol, hydralazine - isosorbide and empagliflozin   Continue diuresis with torsemide  Not on RAAS inhibition due to risk of worsening renal function    For his ischemic cardiomyopathy, continue with aspirin and clopidogrel.  Continue with Atorvastatin.   Ok to discontinue telemetry    2. T2DM dyslipidemia  Capillary glucose monitoring as needed.  Patient is tolerating po well.  Continue with statin therapy   3.  AKI on Stage 3 b CKD/ hyponatremia. Hypokalemia   Renal function with serum cr at 2.0 with K at 3,4 and serum bicarbonate at 24 with Na 138.  Plan to add 40 meq Kcl today, follow up renal function in 48 to 72 hrs Will need close follow up of renal function as outpatient    4. Urinary retention due to BPH.  Continue with finesteride and tamsulosin. Foley cathter has been removed with no recurrent urinary retention    5. Moderate calorie protein malnutrition. Cognitive impairment. Nutritional supplementation  Patient with very poor insight of his heart condition, he had frequent hospitalizations for decompensated heart failure.  Plan to transfer to a memory unit.    Status is: Inpatient  Remains inpatient appropriate because: pending transfer to memory unit    DVT prophylaxis: Enoxaparin   Code Status:    full  Family Communication:  No family at the bedside      Nutrition Status: Nutrition Problem: Moderate Malnutrition Etiology: chronic illness (CHF) Signs/Symptoms: moderate fat depletion, severe muscle depletion Interventions: Ensure Enlive (each supplement provides 350kcal and 20 grams of protein), Liberalize Diet       Subjective: Patient with no nausea or vomiting, no dyspnea or chest pain, has been out of bed and  ambulating. He is willing to go home,   Objective: Vitals:   01/03/22 2006 01/04/22 0258 01/04/22 0356 01/04/22 0831  BP: (!) 125/52  (!) 106/41 133/71  Pulse: 60   61  Resp: 18  18   Temp: 97.6 F (36.4 C)  98 F (36.7 C) 97.7 F (36.5 C)  TempSrc: Oral  Oral Oral  SpO2: 100%  95% 99%  Weight:  56.2 kg    Height:        Intake/Output Summary (Last 24 hours) at 01/04/2022 1020 Last data filed at 01/04/2022 0810 Gross per 24 hour  Intake 1140 ml  Output 1820 ml  Net -680 ml   Filed Weights   01/02/22 0415 01/03/22 0459 01/04/22 0258  Weight: 56.6 kg 56.7 kg 56.2 kg    Examination:   General: Not in pain or dyspnea,  Neurology: Awake and alert, non focal  E ENT: no pallor, no icterus, oral mucosa moist Cardiovascular: No JVD. S1-S2 present, rhythmic, no gallops, rubs, or murmurs. No lower extremity edema. Pulmonary: positive breath sounds bilaterally, with no wheezing, rhonchi or rales. Gastrointestinal. Abdomen soft and non tender Skin. No rashes Musculoskeletal: no joint deformities     Data Reviewed: I have personally reviewed following labs and imaging studies  CBC: Recent Labs  Lab 12/30/21 2058 01/01/22 0332  WBC 10.1 9.4  NEUTROABS 7.7  --   HGB 11.9* 10.9*  HCT 41.7 35.9*  MCV 81.1 77.4*  PLT 654* 163*   Basic Metabolic Panel: Recent Labs  Lab 12/30/21 2058 01/01/22 0332 01/02/22 0350 01/04/22 0411  NA 138 139 133* 138  K 4.6 4.2 3.5 3.4*  CL 109 105 98 102  CO2 17* 23 25 24   GLUCOSE 118* 121* 98 136*  BUN 29* 32* 34* 33*  CREATININE 1.91* 2.20* 2.24* 2.00*  CALCIUM 8.9 8.4* 8.1* 8.1*   GFR: Estimated Creatinine Clearance: 22 mL/min (A) (by C-G formula based on SCr of 2 mg/dL (H)). Liver Function Tests: Recent Labs  Lab 12/30/21 2058  AST 82*  ALT 77*  ALKPHOS 125  BILITOT 0.7  PROT 7.2  ALBUMIN 3.6   No results for input(s): LIPASE, AMYLASE in the last 168 hours. No results for input(s): AMMONIA in the last 168  hours. Coagulation Profile: No results for input(s): INR, PROTIME in the last 168 hours. Cardiac Enzymes: No results for input(s): CKTOTAL, CKMB, CKMBINDEX, TROPONINI in the last 168 hours. BNP (last 3 results) No results for input(s): PROBNP in the last 8760 hours. HbA1C: No results for input(s): HGBA1C in the last 72 hours. CBG: Recent Labs  Lab 01/02/22 1606 01/02/22 2126 01/03/22 0607 01/03/22 2101 01/04/22 0555  GLUCAP 146* 135* 130* 144* 130*   Lipid Profile: No results for input(s): CHOL, HDL, LDLCALC, TRIG, CHOLHDL, LDLDIRECT in the last 72 hours. Thyroid Function Tests: No results for input(s): TSH, T4TOTAL, FREET4, T3FREE, THYROIDAB in the last 72 hours. Anemia Panel: No results for input(s): VITAMINB12, FOLATE, FERRITIN, TIBC, IRON, RETICCTPCT in the last 72 hours.    Radiology Studies: I have reviewed all of the imaging during this hospital visit personally  Scheduled Meds:  aspirin EC  81 mg Oral Daily   atorvastatin  80 mg Oral Daily   carvedilol  6.25 mg Oral BID WC   clopidogrel  75 mg Oral Daily   docusate sodium  100 mg Oral BID   empagliflozin  10 mg Oral Daily   enoxaparin (LOVENOX) injection  30 mg Subcutaneous Q24H   feeding supplement  237 mL Oral Q24H   finasteride  5 mg Oral Daily   isosorbide-hydrALAZINE  1 tablet Oral TID   pantoprazole  40 mg Oral Daily   potassium chloride  40 mEq Oral Once   sodium chloride flush  3 mL Intravenous Q12H   tamsulosin  0.8 mg Oral Daily   torsemide  40 mg Oral Daily   Continuous Infusions:   LOS: 3 days        Ndidi Nesby Gerome Apley, MD

## 2022-01-05 DIAGNOSIS — E782 Mixed hyperlipidemia: Secondary | ICD-10-CM | POA: Diagnosis not present

## 2022-01-05 DIAGNOSIS — I5023 Acute on chronic systolic (congestive) heart failure: Secondary | ICD-10-CM | POA: Diagnosis not present

## 2022-01-05 DIAGNOSIS — N1832 Chronic kidney disease, stage 3b: Secondary | ICD-10-CM | POA: Diagnosis not present

## 2022-01-05 DIAGNOSIS — E440 Moderate protein-calorie malnutrition: Secondary | ICD-10-CM | POA: Diagnosis not present

## 2022-01-05 LAB — GLUCOSE, CAPILLARY: Glucose-Capillary: 148 mg/dL — ABNORMAL HIGH (ref 70–99)

## 2022-01-05 MED ORDER — LACTATED RINGERS IV BOLUS
250.0000 mL | Freq: Once | INTRAVENOUS | Status: AC
Start: 2022-01-05 — End: 2022-01-06
  Administered 2022-01-05: 250 mL via INTRAVENOUS

## 2022-01-05 NOTE — Progress Notes (Signed)
HOSPITAL MEDICINE OVERNIGHT EVENT NOTE    Notified by nursing the patient has been exhibiting worsening hypotension over the past several hours.  Initially, blood pressure was 90/38 with patient exhibiting somewhat increased lethargy compared to earlier in the day.  Chart reviewed, patient has been actively diuresed, currently on torsemide.  Patient has been monitored for several hours in the hopes of blood pressure will transiently increase on its own fortunately blood pressure continues to be 92/47 at 10:11 PM.  We will go ahead and administer 250 cc bolus of lactated Ringer's and reassess.  Vernelle Emerald  MD [facility]

## 2022-01-05 NOTE — Progress Notes (Signed)
PROGRESS NOTE    Randy [name]  DJM:[id number] DOB: [birth year] DOA: 12/30/2021 PCP: Jilda Panda, MD    Brief Narrative:  Randy [name] was admitted to the hospital with the working diagnosis of acute on chronic systolic heart failure exacerbation.    81 yo male with the past medical history of chronic systolic heart failure, CAD, and HTN who presented with dyspnea. Limited history due to language barrier. Reported 24 hrs of dyspnea, and lower extremity edema. Positive suprapubic pain. On his initial physical examination his blood pressure was 125/74, HR 63, RR 21 and oxygen saturation 93%, lung with no rales or wheezing, heart with S1 and S2 present and rhythmic, abdomen soft, positive 3+ lower extremity edema.    Na 138, K 4,6, cl 109, bicarb 17, glucose 118, bun 29 and cr at 1,91 AST 82 and ALT 77 BNP >4,500  Wbc 10.1, hgb 11.9, hct 41,7 and plt 654 SARS COVID 19 negative   Ua with specific gravity 1.020, 0-5 wbc and >50 rbc    Chest radiograph with cardiomegaly and increase hilar vascular congestion, small left pleural effusion., (personally reviewed).    KG 74 bpm, with normal axis and normal intervals, sinus rhythm with no significant ST segment or T wave changes, low voltage.    Patient has been placed on furosemide for diuresis   Tolerating well diuresis, will need assistance with his medications at discharge.    Patient with poor outpatient support, plan to transfer to memory unit.       Assessment & Plan:   Principal Problem:   Acute on chronic systolic (congestive) heart failure (HCC) Active Problems:   CAD, multiple vessel   Stage 3b chronic kidney disease ([place])   Mixed hyperlipidemia   Urinary retention due to benign prostatic hyperplasia   Hyperglycemia   Diarrhea   Heart failure (HCC)   Malnutrition of moderate degree   Acute on chronic systolic heart failure exacerbation. Ischemic cardiomyopathy.  11/2021 echocardiogram with reduced LV systolic  function EF 30 to 35% with global hypokinesis, RV systolic function preserved. Moderate pulmonary hypertension.  Not candidate for revascularization per CT surgery 01/2021. (Old records personally reviewed).   Continue with carvedilol, hydralazine - isosorbide and empagliflozin   Diuresis with torsemide  Not on RAAS inhibition due to risk of worsening renal function  Ischemic cardiomyopathy, continue with aspirin and clopidogrel.  On Atorvastatin.  Now off telemetry, medically stable for discharge but no safe place where patient can be discharged. Possible memory unit.     2. T2DM dyslipidemia  Glucose continue to be well controlled, continue with statin therapy.    3.  AKI on Stage 3 b CKD/ hyponatremia. Hypokalemia   Continue torsemide for diuresis, will check BMP in 48 hrs  Clinically euvolemic.    4. Urinary retention due to BPH.  Urinary retention has resolved continue with finesteride and tamsulosin. Foley cathter has been discontinued    5. Moderate calorie protein malnutrition. Cognitive impairment.  Continue with nutritional supplementation. Patient has been threatening to go home, but this am Ok to wait for a safe discharge place.   Patient with very poor insight of his heart condition, he had frequent hospitalizations for decompensated heart failure.  Plan to transfer to a memory unit.     Status is: Inpatient  Remains inpatient appropriate because: Pending transfer to memory unit,   DVT prophylaxis: Enoxaparin   Code Status:    full  Family Communication:   No family at the bedside  Nutrition Status: Nutrition Problem: Moderate Malnutrition Etiology: chronic illness (CHF) Signs/Symptoms: moderate fat depletion, severe muscle depletion Interventions: Ensure Enlive (each supplement provides 350kcal and 20 grams of protein), Liberalize Diet    Subjective: Patient with no nausea or vomiting, no dyspnea or lower extremity edema. Patient with no chest pain.  Yesterday he was asking about going home but today he is more calm and [place] waiting for a safe place.   Objective: Vitals:   01/04/22 1716 01/04/22 1945 01/05/22 0436 01/05/22 0819  BP: (!) 96/54 (!) 107/50 138/71 107/79  Pulse: 97  72 80  Resp:   18 17  Temp: 97.7 F (36.5 C) 97.7 F (36.5 C) 98.2 F (36.8 C)   TempSrc: Oral Oral Oral   SpO2: 95% 95% 99% 99%  Weight:   55 kg   Height:        Intake/Output Summary (Last 24 hours) at 01/05/2022 1005 Last data filed at 01/05/2022 0840 Gross per 24 hour  Intake 360 ml  Output 1575 ml  Net -1215 ml   Filed Weights   01/03/22 0459 01/04/22 0258 01/05/22 0436  Weight: 56.7 kg 56.2 kg 55 kg    Examination:   General:  not in pain or dyspnea.  Neurology: Awake and alert, non focal. Likely cognitive impairment, positive language barrier E ENT: no pallor, no icterus, oral mucosa  Cardiovascular: heart with S1 and S2 present and rhythmic, no gallops. No lower extremity edema.  Pulmonary: lung with no wheezing, or rhonchi  Gastrointestinal. Non tender Skin. No rashes Musculoskeletal: no joint deformities     Data Reviewed: I have personally reviewed following labs and imaging studies  CBC: Recent Labs  Lab 12/30/21 2058 01/01/22 0332  WBC 10.1 9.4  NEUTROABS 7.7  --   HGB 11.9* 10.9*  HCT 41.7 35.9*  MCV 81.1 77.4*  PLT 654* 299*   Basic Metabolic Panel: Recent Labs  Lab 12/30/21 2058 01/01/22 0332 01/02/22 0350 01/04/22 0411  NA 138 139 133* 138  K 4.6 4.2 3.5 3.4*  CL 109 105 98 102  CO2 17* 23 25 24   GLUCOSE 118* 121* 98 136*  BUN 29* 32* 34* 33*  CREATININE 1.91* 2.20* 2.24* 2.00*  CALCIUM 8.9 8.4* 8.1* 8.1*   GFR: Estimated Creatinine Clearance: 22 mL/min (A) (by C-G formula based on SCr of 2 mg/dL (H)). Liver Function Tests: Recent Labs  Lab 12/30/21 2058  AST 82*  ALT 77*  ALKPHOS 125  BILITOT 0.7  PROT 7.2  ALBUMIN 3.6   No results for input(s): LIPASE, AMYLASE in the last 168 hours. No  results for input(s): AMMONIA in the last 168 hours. Coagulation Profile: No results for input(s): INR, PROTIME in the last 168 hours. Cardiac Enzymes: No results for input(s): CKTOTAL, CKMB, CKMBINDEX, TROPONINI in the last 168 hours. BNP (last 3 results) No results for input(s): PROBNP in the last 8760 hours. HbA1C: No results for input(s): HGBA1C in the last 72 hours. CBG: Recent Labs  Lab 01/02/22 2126 01/03/22 0607 01/03/22 2101 01/04/22 0555 01/05/22 0612  GLUCAP 135* 130* 144* 130* 148*   Lipid Profile: No results for input(s): CHOL, HDL, LDLCALC, TRIG, CHOLHDL, LDLDIRECT in the last 72 hours. Thyroid Function Tests: No results for input(s): TSH, T4TOTAL, FREET4, T3FREE, THYROIDAB in the last 72 hours. Anemia Panel: No results for input(s): VITAMINB12, FOLATE, FERRITIN, TIBC, IRON, RETICCTPCT in the last 72 hours.    Radiology Studies: I have reviewed all of the imaging during this hospital visit personally  Scheduled Meds:  aspirin EC  81 mg Oral Daily   atorvastatin  80 mg Oral Daily   carvedilol  6.25 mg Oral BID WC   clopidogrel  75 mg Oral Daily   docusate sodium  100 mg Oral BID   empagliflozin  10 mg Oral Daily   enoxaparin (LOVENOX) injection  30 mg Subcutaneous Q24H   feeding supplement  237 mL Oral Q24H   finasteride  5 mg Oral Daily   isosorbide-hydrALAZINE  1 tablet Oral TID   pantoprazole  40 mg Oral Daily   potassium chloride  40 mEq Oral Once   sodium chloride flush  3 mL Intravenous Q12H   tamsulosin  0.8 mg Oral Daily   torsemide  40 mg Oral Daily   Continuous Infusions:   LOS: 4 days        Lorrena Goranson Gerome Apley, MD

## 2022-01-06 LAB — GLUCOSE, CAPILLARY: Glucose-Capillary: 202 mg/dL — ABNORMAL HIGH (ref 70–99)

## 2022-01-06 MED ORDER — ISOSORB DINITRATE-HYDRALAZINE 20-37.5 MG PO TABS
1.0000 | ORAL_TABLET | Freq: Two times a day (BID) | ORAL | Status: DC
  Administered 2022-01-06 – 2022-01-10 (×3): 1 via ORAL
  Filled 2022-01-06 (×4): qty 1

## 2022-01-06 NOTE — TOC CM/SW Note (Addendum)
HF TOC CM spoke to with assistance of bedside interpreter, [place] # [phone], pt agreeable to placement at [facility]. Contacted pt's dtr, Nori while in the room. She wanted CM to follow up with [facility].   Contacted  [facility] -left message for return call [facility]-requested information be faxed Freddy Finner # [phone], fax [phone], facility with help switch Medicaid to Special Assistance Medicaid to cover [place] [facility] -requested information be faxed attn Janetta # [phone] fax [phone] [facility] message for return for call, received call back no bed availability [facility]-left message for return call    [place], Heart Failure TOC CM [phone]

## 2022-01-06 NOTE — TOC CM/SW Note (Signed)
HF TOC CM made contacted son, [name] and left message for a return call. Received call back from Melina Modena, [facility] DSS to complete APS report. APS report completed. Pt will be assigned a DSS CSW to investigate pt's case. [place], Heart Failure TOC CM [phone]

## 2022-01-06 NOTE — Progress Notes (Signed)
PROGRESS NOTE    Randy [name]  WRU:[id number] DOB: 08-Sep-1941 DOA: 12/30/2021 PCP: Jilda Panda, MD    Brief Narrative:  Randy [name] was admitted to the hospital with the working diagnosis of acute on chronic systolic heart failure exacerbation.    81 yo male with the past medical history of chronic systolic heart failure, CAD, and HTN who presented with dyspnea. Limited history due to language barrier. Reported 24 hrs of dyspnea, and lower extremity edema. Positive suprapubic pain. On his initial physical examination his blood pressure was 125/74, HR 63, RR 21 and oxygen saturation 93%, lung with no rales or wheezing, heart with S1 and S2 present and rhythmic, abdomen soft, positive 3+ lower extremity edema.    Na 138, K 4,6, cl 109, bicarb 17, glucose 118, bun 29 and cr at 1,91 AST 82 and ALT 77 BNP >4,500  Wbc 10.1, hgb 11.9, hct 41,7 and plt 654 SARS COVID 19 negative   Ua with specific gravity 1.020, 0-5 wbc and >50 rbc    Chest radiograph with cardiomegaly and increase hilar vascular congestion, small left pleural effusion., (personally reviewed).    KG 74 bpm, with normal axis and normal intervals, sinus rhythm with no significant ST segment or T wave changes, low voltage.    Patient has been placed on furosemide for diuresis   Tolerating well diuresis, will need assistance with his medications at discharge.    Patient with poor outpatient support, plan to transfer to memory unit.     Discussion with physical interpreter at bedside; patient in agreement to receive detailed cellulitis information about discharge options and how much will cost.  Continue current therapy and secure outpatient follow-up and support for safe discharge.   Assessment & Plan:   Principal Problem:   Acute on chronic systolic (congestive) heart failure (HCC) Active Problems:   CAD, multiple vessel   Stage 3b chronic kidney disease ([place])   Mixed hyperlipidemia   Urinary retention due to  benign prostatic hyperplasia   Hyperglycemia   Diarrhea   Heart failure (HCC)   Malnutrition of moderate degree   Acute on chronic systolic heart failure exacerbation. Ischemic cardiomyopathy.  11/2021 echocardiogram with reduced LV systolic function EF 30 to 35% with global hypokinesis, RV systolic function preserved. Moderate pulmonary hypertension.  Not candidate for revascularization per CT surgery 01/2021. (Old records personally reviewed). -Continue with carvedilol, hydralazine - isosorbide and empagliflozin   -Diuresis with torsemide  -Not on RAAS inhibition due to risk of worsening renal function  -Ischemic cardiomyopathy, continue with aspirin and clopidogrel.  Patient denies chest pain.  Continue the use of statin. -Now off telemetry, medically stable for discharge but no safe place where patient can be discharged. Possible memory unit.     2. T2DM dyslipidemia  -Glucose continue to be overall well controlled, continue current sliding scale insulin and follow CBGs fluctuation. -Continue with statin therapy.  -Heart healthy/modified carbohydrate diet has been recommended.   3.  AKI on Stage 3 b CKD/ hyponatremia. Hypokalemia  -Continue to follow electrolytes and renal function -Replete electrolytes as needed -Continue to follow daily weights/urine output and maintain adequate hydration -Continue current diuresis therapy.     4. Urinary retention due to BPH.  -Urinary retention has resolved continue with finesteride and tamsulosin. -Foley catheter successfully removed; continue to follow urine output.   5. Moderate calorie protein malnutrition. Cognitive impairment.  -Continue with nutritional supplementation. -Good appetite. -Slightly frustrated about discharge situation; in agreement to receive information/details of memory unit/ALF  for discharge purposes. -Overall demonstrating to have capacity but poor insight of his heart condition.  No social support or assistance to  help patient controlling condition as an outpatient available at this time.  TOC/social Civil engineer, contracting with transition of care.  Status is: Inpatient  Remains inpatient appropriate because: Pending transfer to memory unit,   DVT prophylaxis: Enoxaparin   Code Status:    full  Family Communication:   No family at the bedside, but daughter contacted over the phone at bedside with assistance of case manager.   Nutrition Status: Nutrition Problem: Moderate Malnutrition Etiology: chronic illness (CHF) Signs/Symptoms: moderate fat depletion, severe muscle depletion Interventions: Ensure Enlive (each supplement provides 350kcal and 20 grams of protein), Liberalize Diet    Subjective: Patient was seen with assistance of physical interpreter; no chest pain, no nausea, no vomiting, no shortness of breath currently.  Objective: Vitals:   01/06/22 0012 01/06/22 0421 01/06/22 0500 01/06/22 1146  BP: (!) 101/53 119/62  130/74  Pulse:  71    Resp:  17    Temp:  98.4 F (36.9 C)    TempSrc:  Oral    SpO2:  97%    Weight:   54 kg   Height:        Intake/Output Summary (Last 24 hours) at 01/06/2022 1457 Last data filed at 01/06/2022 1100 Gross per 24 hour  Intake 240 ml  Output 1645 ml  Net -1405 ml   Filed Weights   01/04/22 0258 01/05/22 0436 01/06/22 0500  Weight: 56.2 kg 55 kg 54 kg    Examination:  General exam: Alert, awake, oriented x 2 (unable to tell me time), demonstrated  adequate capacity and is physically capable. Denies CP, palpitations and SOB currently. Tolerating diet and not requiring oxygen supplementation. Respiratory system: Improved air movement bilaterally, no using accessory muscle.  Good saturation on room air.  No wheezing or crackles. Cardiovascular system: RRR.  No rubs, no gallops, no JVD on exam.   Gastrointestinal system: Abdomen is nondistended, soft and nontender. No organomegaly or masses felt. Normal bowel sounds heard. Central nervous system:  Alert and oriented. No focal neurological deficits. Extremities: No cyanosis, clubbing or edema.  No joint deformities appreciated. Skin: No rashes, no petechiae or open wounds. Psychiatry: Judgement and insight appear normal. Mood & affect appropriate.    Data Reviewed: I have personally reviewed following labs and imaging studies  CBC: Recent Labs  Lab 12/30/21 2058 01/01/22 0332  WBC 10.1 9.4  NEUTROABS 7.7  --   HGB 11.9* 10.9*  HCT 41.7 35.9*  MCV 81.1 77.4*  PLT 654* 086*   Basic Metabolic Panel: Recent Labs  Lab 12/30/21 2058 01/01/22 0332 01/02/22 0350 01/04/22 0411  NA 138 139 133* 138  K 4.6 4.2 3.5 3.4*  CL 109 105 98 102  CO2 17* 23 25 24   GLUCOSE 118* 121* 98 136*  BUN 29* 32* 34* 33*  CREATININE 1.91* 2.20* 2.24* 2.00*  CALCIUM 8.9 8.4* 8.1* 8.1*   GFR: Estimated Creatinine Clearance: 21.8 mL/min (A) (by C-G formula based on SCr of 2 mg/dL (H)).  Liver Function Tests: Recent Labs  Lab 12/30/21 2058  AST 82*  ALT 77*  ALKPHOS 125  BILITOT 0.7  PROT 7.2  ALBUMIN 3.6   CBG: Recent Labs  Lab 01/03/22 0607 01/03/22 2101 01/04/22 0555 01/05/22 0612 01/06/22 1119  GLUCAP 130* 144* 130* 148* 202*   Radiology Studies: I have reviewed all of the imaging during this hospital visit personally  Scheduled Meds:  aspirin EC  81 mg Oral Daily   atorvastatin  80 mg Oral Daily   carvedilol  6.25 mg Oral BID WC   clopidogrel  75 mg Oral Daily   docusate sodium  100 mg Oral BID   empagliflozin  10 mg Oral Daily   enoxaparin (LOVENOX) injection  30 mg Subcutaneous Q24H   feeding supplement  237 mL Oral Q24H   finasteride  5 mg Oral Daily   isosorbide-hydrALAZINE  1 tablet Oral q12n4p   pantoprazole  40 mg Oral Daily   potassium chloride  40 mEq Oral Once   sodium chloride flush  3 mL Intravenous Q12H   tamsulosin  0.8 mg Oral Daily   torsemide  40 mg Oral Daily   Continuous Infusions:   LOS: 5 days     Barton Dubois,  MD [phone]

## 2022-01-06 NOTE — TOC CM/SW Note (Addendum)
[place] pm La[place] in house interpreter will be at bedside until 130 pm for all disciplines to speak to patient. Jonnie Finner RN3 CCM, Heart Failure TOC CM [phone]   4103 am HF TOC CM contacted [facility] for an in house interpreter for communication for patient. Will give CM a call back with time and date.    HF TOC CM contacted [place] to start APS report. After hours call service, will have DSS CSW give a call back.    [place], Heart Failure TOC CM [phone]

## 2022-01-06 NOTE — Progress Notes (Signed)
Heart Failure Navigator Progress Note  Assessed for Heart & Vascular TOC clinic readiness. Patient has missed HV TOC appts 06/2021, attempted to reschedule appt, speaking with daughter--declined d/t her work schedule. Pt has poor outpatient support, lives at [facility]; may benefit from more advanced outpatient care/facility as he has multiple ED visits and hospitalizations. Not well managed.  Has memory impairments, no plans for HV TOC clinic at present.  Navigator available for reassessment of patient.   Pricilla Holm, MSN, RN Heart Failure Nurse Navigator [phone]

## 2022-01-07 MED ORDER — MUSCLE RUB 10-15 % EX CREA
TOPICAL_CREAM | CUTANEOUS | Status: DC | PRN
  Filled 2022-01-07: qty 85

## 2022-01-07 NOTE — Progress Notes (Signed)
Pt refusing medications. Audio interpreter used but pt was hard to understand and oriented to self only. Pt did not seem to understand what this nurse was doing but did not want any staff to touch him at this time.

## 2022-01-07 NOTE — NC FL2 (Signed)
[place] MEDICAID FL2 LEVEL OF CARE SCREENING TOOL     IDENTIFICATION  Patient Name: Randy [name] Birthdate: Feb 15, 1941 Sex: male Admission Date (Current Location): 12/30/2021  [place] and [place] Number:  Kathleen Argue [id number] [place] and Address:  The [place], [place] [address] [place], [place] [address]      Provider Number: [id number]  Attending Physician Name and Address:  Tawni Millers,*  Relative Name and Phone Number:  Harlene Ramus #[phone], CHYI FOYDXAJOI [phone]    Current Level of Care: Hospital Recommended Level of Care: Memory Care Prior Approval Number:    Date Approved/Denied:   PASRR Number:    Discharge Plan: Other (Comment) (Memory Care)    Current Diagnoses: Patient Active Problem List   Diagnosis Date Noted   Malnutrition of moderate degree 01/02/2022   Heart failure ([place]) 01/01/2022   Acute on chronic systolic (congestive) heart failure ([place]) 12/31/2021   Diarrhea 12/31/2021   Ischemic cardiomyopathy 12/14/2021   CHF exacerbation ([place]) 12/12/2021   SOB (shortness of breath) 11/22/2021   Elevated troponin 11/22/2021   Hyperglycemia 11/22/2021   Blood creatinine increased compared with prior measurement 11/22/2021   Acute respiratory failure with hypoxia and hypercapnia ([place]) 11/21/2021   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   Urinary tract infection 11/16/2021   Microcytic anemia 11/16/2021   Thrombocytosis 11/16/2021   Bradycardia 07/05/2021   Hematuria 07/05/2021   Anemia 07/05/2021   Acute on chronic systolic CHF (congestive heart failure) ([place]) 07/01/2021   CAD, 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   Stage 3b chronic kidney disease ([place])    Primary hypertension    Mixed hyperlipidemia    AKI-CKD 3B    NSTEMI (non-ST elevated myocardial infarction) ([place])    Hypertensive crisis 01/29/2021   Acute respiratory failure with hypoxia ([place]) 01/29/2021    Orientation  RESPIRATION BLADDER Height & Weight     Self  Normal Continent Weight: 54.8 kg (scale a) Height:  5\' 2"  (157.5 cm)  BEHAVIORAL SYMPTOMS/MOOD NEUROLOGICAL BOWEL NUTRITION STATUS      Continent Diet (Heart Healthy)  AMBULATORY STATUS COMMUNICATION OF NEEDS Skin   Independent  (Laotian, limited English) Normal                       Personal Care Assistance Level of Assistance  Bathing, Dressing Bathing Assistance: Limited assistance Feeding assistance: Independent Dressing Assistance: Limited assistance     Functional Limitations Info  Sight, Hearing, Speech Sight Info: Impaired (glasses) Hearing Info: Adequate Speech Info: Adequate (Laotian)    SPECIAL CARE FACTORS FREQUENCY                       Contractures Contractures Info: Not present    Additional Factors Info  Code Status, Allergies Code Status Info: Full Allergies Info: No Known Allergies           Current Medications (01/07/2022):  This is the current hospital active medication list Current Facility-Administered Medications  Medication Dose Route Frequency Provider Last Rate Last Admin   acetaminophen (TYLENOL) tablet 650 mg  650 mg Oral Q6H PRN Karmen Bongo, MD   650 mg at 01/07/22 0318   Or   acetaminophen (TYLENOL) suppository 650 mg  650 mg Rectal Q6H PRN Karmen Bongo, MD       aspirin EC tablet 81 mg  81 mg Oral Daily Karmen Bongo, MD   81 mg at 01/06/22 0931   atorvastatin (LIPITOR) tablet 80 mg  80  mg Oral Daily Karmen Bongo, MD   80 mg at 01/06/22 0931   bisacodyl (DULCOLAX) EC tablet 5 mg  5 mg Oral Daily PRN Karmen Bongo, MD       carvedilol (COREG) tablet 6.25 mg  6.25 mg Oral BID WC Karmen Bongo, MD   6.25 mg at 01/07/22 0754   clopidogrel (PLAVIX) tablet 75 mg  75 mg Oral Daily Karmen Bongo, MD   75 mg at 01/06/22 0931   docusate sodium (COLACE) capsule 100 mg  100 mg Oral BID Karmen Bongo, MD   100 mg at 01/06/22 2252   empagliflozin (JARDIANCE) tablet 10 mg   10 mg Oral Daily Arrien, Jimmy Picket, MD   10 mg at 01/06/22 0931   enoxaparin (LOVENOX) injection 30 mg  30 mg Subcutaneous Q24H Karmen Bongo, MD   30 mg at 01/06/22 2252   feeding supplement (ENSURE ENLIVE / ENSURE PLUS) liquid 237 mL  237 mL Oral Q24H Tawni Millers, MD   237 mL at 01/06/22 2252   finasteride (PROSCAR) tablet 5 mg  5 mg Oral Daily Karmen Bongo, MD   5 mg at 01/06/22 0931   isosorbide-hydrALAZINE (BIDIL) 20-37.5 MG per tablet 1 tablet  1 tablet Oral q12n4p Barton Dubois, MD   1 tablet at 01/06/22 1216   Muscle Rub CREA   Topical PRN Shalhoub, Sherryll Burger, MD       pantoprazole (PROTONIX) EC tablet 40 mg  40 mg Oral Daily Arrien, Jimmy Picket, MD   40 mg at 01/06/22 0931   polyethylene glycol (MIRALAX / GLYCOLAX) packet 17 g  17 g Oral Daily PRN Karmen Bongo, MD       sodium chloride flush (NS) 0.9 % injection 3 mL  3 mL Intravenous Q12H Karmen Bongo, MD   3 mL at 01/07/22 0958   tamsulosin (FLOMAX) capsule 0.8 mg  0.8 mg Oral Daily Karmen Bongo, MD   0.8 mg at 01/06/22 0931   torsemide (DEMADEX) tablet 40 mg  40 mg Oral Daily Arrien, Jimmy Picket, MD   40 mg at 01/06/22 0931   traZODone (DESYREL) tablet 25 mg  25 mg Oral QHS PRN Karmen Bongo, MD   25 mg at 01/05/22 2049     Discharge Medications: Please see discharge summary for a list of discharge medications.  Relevant Imaging Results:  Relevant Lab Results:   Additional Information Social Security # [phone]  Locklan Canoy, Rexene Alberts, RN

## 2022-01-07 NOTE — Progress Notes (Signed)
Patient refused to have vital signs taken.

## 2022-01-07 NOTE — TOC CM/SW Note (Addendum)
HF TOC CM received call back from [facility], Helene Kelp, # [phone] email [email or web address]. They have available beds but will need updated FL2. She will review and see if pt meets criteria.  [place], requested paperwork be faxed to [facility]ozell Searing # [phone] fax # [phone], they have available beds and will review. [place], Heart Failure TOC CM [phone]

## 2022-01-07 NOTE — TOC CM/SW Note (Signed)
HF TOC CM contacted [facility] and spoke [place]. They are unable to accept referral. Contacted [facility] and left message for [employer]. Spoke to [facility] and no beds available.   [facility] -left message for return call [facility]-requested information be faxed Freddy Finner # [phone], fax [phone], facility with help switch Medicaid to Special Assistance Medicaid to cover [facility] -requested information be faxed attn Janetta # [phone] fax [phone] referral [facility]-left message for return for call, received call back no bed availability- no beds available.  [facility]-left message for return call  [facility]

## 2022-01-07 NOTE — Progress Notes (Signed)
PROGRESS NOTE    Randy [name]  WUJ:[id number] DOB: 1940-12-26 DOA: 12/30/2021 PCP: Jilda Panda, MD    Brief Narrative:  Mr. Bouffard was admitted to the hospital with the working diagnosis of acute on chronic systolic heart failure exacerbation.    81 yo male with the past medical history of chronic systolic heart failure, CAD, and HTN who presented with dyspnea. Limited history due to language barrier and cognitive impairment.. Reported 24 hrs of dyspnea, and lower extremity edema. Positive suprapubic pain. On his initial physical examination his blood pressure was 125/74, HR 63, RR 21 and oxygen saturation 93%, lung with no rales or wheezing, heart with S1 and S2 present and rhythmic, abdomen soft, positive 3+ lower extremity edema.    Na 138, K 4,6, cl 109, bicarb 17, glucose 118, bun 29 and cr at 1,91 AST 82 and ALT 77 BNP >4,500  Wbc 10.1, hgb 11.9, hct 41,7 and plt 654 SARS COVID 19 negative   Ua with specific gravity 1.020, 0-5 wbc and >50 rbc    Chest radiograph with cardiomegaly and increase hilar vascular congestion, small left pleural effusion., (personally reviewed).    EKG 74 bpm, with normal axis and normal intervals, sinus rhythm with no significant ST segment or T wave changes, low voltage.    Patient was placed on IV furosemide for diuresis, negative fluid balance was achieved with improvement in his symptoms.     Patient with poor outpatient support, plan to transfer to memory unit.   Patient with poor insight of his heart failure diagnosis.      Assessment & Plan:   Principal Problem:   Acute on chronic systolic (congestive) heart failure (HCC) Active Problems:   CAD, multiple vessel   Stage 3b chronic kidney disease ([place])   Mixed hyperlipidemia   Urinary retention due to benign prostatic hyperplasia   Hyperglycemia   Diarrhea   Heart failure (HCC)   Malnutrition of moderate degree     Acute on chronic systolic heart failure exacerbation.  Ischemic cardiomyopathy.  11/2021 echocardiogram with reduced LV systolic function EF 30 to 35% with global hypokinesis, RV systolic function preserved. Moderate pulmonary hypertension.  Not candidate for revascularization per CT surgery 01/2021. (Old records personally reviewed).   His heart failure now is compensated, plan to continue with  carvedilol, hydralazine - isosorbide and empagliflozin. Continue diuresis with torsemide.  He has been declining oral medications.  For his ischemic cardiomyopathy, continue with aspirin and clopidogrel.  Not candidate for revascularization.  Continue with atorvastatin.   Discontinue telemetry.  Pending transfer to memory unit.   2. T2DM dyslipidemia  Patient is tolerating po well, his glucose has been stable Continue with statin therapy.    3.  AKI on Stage 3 b CKD/ hyponatremia. Hypokalemia   Patient with euvolemic, continue diuresis with torsemide.  Plan to follow up renal function in am.    4. Urinary retention due to BPH.  Urinary retention has resolved continue with finesteride and tamsulosin. Foley cathter has been discontinued   Continue urin output monitoring.    5. Moderate calorie protein malnutrition. Cognitive impairment.  On nutritional supplementation.    Patient with very poor insight of his heart condition, he had frequent hospitalizations for decompensated heart failure.  Plan to transfer to a memory unit.    Status is: Inpatient  Remains inpatient appropriate because: pending placement at the memory unit   DVT prophylaxis: Enoxaparin   Code Status:    full  Family Communication:  No family at the bedside      Nutrition Status: Nutrition Problem: Moderate Malnutrition Etiology: chronic illness (CHF) Signs/Symptoms: moderate fat depletion, severe muscle depletion Interventions: Ensure Enlive (each supplement provides 350kcal and 20 grams of protein), Liberalize Diet    Subjective: Patient has been  threatening to go home, he has poor insight of his  medical condition. His family agree in memory unit.   Objective: Vitals:   01/06/22 1706 01/06/22 2018 01/07/22 0325 01/07/22 0734  BP: (!) 94/58 110/80  (!) 111/44  Pulse:  77  61  Resp:  18  18  Temp:  98.4 F (36.9 C)  97.9 F (36.6 C)  TempSrc:  Oral  Oral  SpO2:  97%  95%  Weight:   54.8 kg   Height:        Intake/Output Summary (Last 24 hours) at 01/07/2022 1150 Last data filed at 01/07/2022 0748 Gross per 24 hour  Intake 480 ml  Output 1410 ml  Net -930 ml   Filed Weights   01/05/22 0436 01/06/22 0500 01/07/22 0325  Weight: 55 kg 54 kg 54.8 kg    Examination:   General:  not in pain or dyspnea  Neurology: Awake and alert, non focal  E ENT: no pallor, no icterus, oral mucosa  Cardiovascular: heart with S1 and S2 present,  Pulmonary: lungs with no wheezing on auscultation  Gastrointestinal. Not distended  Skin. No rashes Musculoskeletal: no joint deformities     Data Reviewed: I have personally reviewed following labs and imaging studies  CBC: Recent Labs  Lab 01/01/22 0332  WBC 9.4  HGB 10.9*  HCT 35.9*  MCV 77.4*  PLT 818*   Basic Metabolic Panel: Recent Labs  Lab 01/01/22 0332 01/02/22 0350 01/04/22 0411  NA 139 133* 138  K 4.2 3.5 3.4*  CL 105 98 102  CO2 23 25 24   GLUCOSE 121* 98 136*  BUN 32* 34* 33*  CREATININE 2.20* 2.24* 2.00*  CALCIUM 8.4* 8.1* 8.1*   GFR: Estimated Creatinine Clearance: 22 mL/min (A) (by C-G formula based on SCr of 2 mg/dL (H)). Liver Function Tests: No results for input(s): AST, ALT, ALKPHOS, BILITOT, PROT, ALBUMIN in the last 168 hours. No results for input(s): LIPASE, AMYLASE in the last 168 hours. No results for input(s): AMMONIA in the last 168 hours. Coagulation Profile: No results for input(s): INR, PROTIME in the last 168 hours. Cardiac Enzymes: No results for input(s): CKTOTAL, CKMB, CKMBINDEX, TROPONINI in the last 168 hours. BNP (last 3  results) No results for input(s): PROBNP in the last 8760 hours. HbA1C: No results for input(s): HGBA1C in the last 72 hours. CBG: Recent Labs  Lab 01/03/22 0607 01/03/22 2101 01/04/22 0555 01/05/22 0612 01/06/22 1119  GLUCAP 130* 144* 130* 148* 202*   Lipid Profile: No results for input(s): CHOL, HDL, LDLCALC, TRIG, CHOLHDL, LDLDIRECT in the last 72 hours. Thyroid Function Tests: No results for input(s): TSH, T4TOTAL, FREET4, T3FREE, THYROIDAB in the last 72 hours. Anemia Panel: No results for input(s): VITAMINB12, FOLATE, FERRITIN, TIBC, IRON, RETICCTPCT in the last 72 hours.    Radiology Studies: I have reviewed all of the imaging during this hospital visit personally     Scheduled Meds:  aspirin EC  81 mg Oral Daily   atorvastatin  80 mg Oral Daily   carvedilol  6.25 mg Oral BID WC   clopidogrel  75 mg Oral Daily   docusate sodium  100 mg Oral BID   empagliflozin  10 mg  Oral Daily   enoxaparin (LOVENOX) injection  30 mg Subcutaneous Q24H   feeding supplement  237 mL Oral Q24H   finasteride  5 mg Oral Daily   isosorbide-hydrALAZINE  1 tablet Oral q12n4p   pantoprazole  40 mg Oral Daily   potassium chloride  40 mEq Oral Once   sodium chloride flush  3 mL Intravenous Q12H   tamsulosin  0.8 mg Oral Daily   torsemide  40 mg Oral Daily   Continuous Infusions:   LOS: 6 days        Jorgina Binning Gerome Apley, MD

## 2022-01-07 NOTE — Care Management Important Message (Signed)
Important Message  Patient Details  Name: Randy [name] MRN: [id number] Date of Birth: [birth year]   Medicare Important Message Given:  Yes     [name] 01/07/2022, 7:56 AM

## 2022-01-07 NOTE — Plan of Care (Signed)
°  Problem: Education: Goal: Ability to demonstrate management of disease process will improve Outcome: Progressing Goal: Ability to verbalize understanding of medication therapies will improve Outcome: Progressing Goal: Individualized Educational Video(s) Outcome: Progressing   Problem: Activity: Goal: Capacity to carry out activities will improve Outcome: Progressing   Problem: Cardiac: Goal: Ability to achieve and maintain adequate cardiopulmonary perfusion will improve Outcome: Progressing   Problem: Education: Goal: Ability to demonstrate management of disease process will improve Outcome: Progressing Goal: Ability to verbalize understanding of medication therapies will improve Outcome: Progressing Goal: Individualized Educational Video(s) Outcome: Progressing

## 2022-01-07 NOTE — Plan of Care (Signed)
°  Problem: Activity: Goal: Capacity to carry out activities will improve Outcome: Progressing   Problem: Education: Goal: Ability to verbalize understanding of medication therapies will improve Outcome: Not Progressing   Problem: Education: Goal: Ability to verbalize understanding of medication therapies will improve Outcome: Not Progressing

## 2022-01-08 MED ORDER — COLCHICINE 0.6 MG PO TABS
0.6000 mg | ORAL_TABLET | Freq: Every day | ORAL | Status: AC
Start: 2022-01-08 — End: 2022-01-10
  Administered 2022-01-09: 0.6 mg via ORAL
  Filled 2022-01-08 (×2): qty 1

## 2022-01-08 MED ORDER — POTASSIUM CHLORIDE CRYS ER 20 MEQ PO TBCR
40.0000 meq | EXTENDED_RELEASE_TABLET | Freq: Once | ORAL | Status: AC
  Administered 2022-01-08: 40 meq via ORAL
  Filled 2022-01-08 (×2): qty 2

## 2022-01-08 NOTE — TOC Progression Note (Addendum)
Transition of Care [facility]) - Progression Note    Patient Details  Name: Randy [name] MRN: [id number] Date of Birth: 10/16/1941  Transition of [facility]) CM/SW Contact  Ranya Fiddler, LCSW Phone Number: 01/08/2022, 10:30 AM  Clinical Narrative:    HF CSW spoke with Ms. Gwenevere Ghazi [phone] with APS who reported that a report had been made and she is the case worker and would like to be updated about the discharge plan from the hospital. CSW to keep Ms. Williams in the loop.  HF CSW faxed clinicals in the hub for bed offers. Pending bed offers.  CSW, Kennyth Lose faxed over [facility] to [facility] and [facility]. Awaiting a response.  12:33pm - CSW spoke with [name] at [facility] who reported she can extend an offer if there is a formal dementia diagnosis although nothing in charts indicating a diagnosis thus far. Attending MD reported that a formal dementia diagnosis will need to be assessed further. CSW Kennyth Lose to complete a mini mental assessment. Inpatient HF CSW to update FL2 with further dx and send to memory care facility in [place], [place].  [facility] will continue to follow through discharge.   Expected Discharge Plan: [place] Barriers to Discharge: Continued Medical Work up  Expected Discharge Plan and Services Expected Discharge Plan: [place] In-house Referral: NA Discharge Planning Services: CM Consult Post Acute Care Choice: Resumption of Svcs/PTA Provider Living arrangements for the past 2 months: Homeless                   DME Agency: NA       HH Arranged: RN, PT, Disease Management [place] Agency: [place] Date [place]: 01/02/22 Time [place]: [id number] Representative spoke with at [place]: [place] ([place]) Interventions    Readmission Risk Interventions Readmission Risk Prevention Plan 01/02/2022 12/13/2021  Transportation Screening  Complete Complete  Medication Review Press photographer) Complete Complete  PCP or Specialist appointment within 3-5 days of discharge Complete Complete  HRI or [place] Complete Complete  SW Recovery Care/Counseling Consult Complete Complete  Palliative Care Screening Not Applicable Not [place] Not Applicable Not Applicable  Some recent data might be hidden

## 2022-01-08 NOTE — Progress Notes (Signed)
CSW met with patient at bedside to complete a MME with the [place] interpreter on the line.  CSW introduced self and discussed need to complete MME. Patient spoke about his frustrations stating that he feels "everyone lying to me and nothing seems to happen". CSW reminded patient about conversation yesterday at bedside with [facility] staff and he is agreeable to go to a facility as concerns of him returning to the shelter for safety. CSW explained support available to assist with meals and daily needs and patient states he continues to be agreeable. He shared he is ready to go and even asked if he could go when we were finished with the MME.  CSW started the MME and patient became frustrated after first few questions stating "how can I know the year when all I do is lay in this bed and look at the window". He was unable to give answers for most questions stating he has "poor memory". Patient scored a 4 on the MME.   Patient stated "however much you can help me I am not complaining". Patient appeared to be grateful for the support and hopeful to go to facility when possible. CSW notified TOC CSW Cortlin Polo of bedside visit. Raquel Sarna, [place], [place]

## 2022-01-08 NOTE — TOC Progression Note (Signed)
Transition of Care [facility]) - Progression Note    Patient Details  Name: Randy [name] MRN: [id number] Date of Birth: [birth year]  Transition of Care [facility]) CM/SW Contact  Zenon Mayo, RN Phone Number: 01/08/2022, 8:27 AM  Clinical Narrative:    NCM received call from Darral Dash from [place] (cell [phone])  office work is [phone]. She states that patient has full Medicaid and he gets 924.00 per month ,all the CSW needs to do is fill out FL2 and put in [place] tracks, patient informed her that he will go to memory care as long as he is treated with respect.  NCM informed her will pass this information along to CSW.    Expected Discharge Plan: [place] Barriers to Discharge: Continued Medical Work up  Expected Discharge Plan and Services Expected Discharge Plan: [place] In-house Referral: NA Discharge Planning Services: CM Consult Post Acute Care Choice: Resumption of Svcs/PTA Provider Living arrangements for the past 2 months: Homeless                   DME Agency: NA       HH Arranged: RN, PT, Disease Management [place] Agency: [place] Date [place]: 01/02/22 Time [place]: 2992 Representative spoke with at [place]: [place] ([place]) Interventions    Readmission Risk Interventions Readmission Risk Prevention Plan 01/02/2022 12/13/2021  Transportation Screening Complete Complete  Medication Review Press photographer) Complete Complete  PCP or Specialist appointment within 3-5 days of discharge Complete Complete  HRI or [place] Complete Complete  SW Recovery Care/Counseling Consult Complete Complete  Palliative Care Screening Not Applicable Not [place] Not Applicable Not Applicable  Some recent data might be hidden

## 2022-01-08 NOTE — Progress Notes (Signed)
CSW contacted [facility] Director, Helene Kelp, # [phone] and left message for return call regarding bed availability.    CSW contacted [facility] and left message for Ronni Rumble # [phone] fax # [phone].   CSW awaiting return call .Raquel Sarna, [place], [place]

## 2022-01-08 NOTE — Progress Notes (Signed)
Patient refused to have labs drawn. 

## 2022-01-08 NOTE — Plan of Care (Signed)
°  Problem: Education: Goal: Ability to demonstrate management of disease process will improve Outcome: Not Progressing   Problem: Education: Goal: Ability to verbalize understanding of medication therapies will improve Outcome: Not Progressing   Problem: Education: Goal: Ability to verbalize understanding of medication therapies will improve Outcome: Not Progressing   Problem: Education: Goal: Ability to demonstrate management of disease process will improve Outcome: Not Progressing

## 2022-01-08 NOTE — Progress Notes (Addendum)
PROGRESS NOTE    Randy [name]  KWI:[id number] DOB: October 08, 1941 DOA: 12/30/2021 PCP: Randy Panda, MD    Brief Narrative:  81 yo male with the past medical history of chronic systolic heart failure, CAD, and HTN who presented with dyspnea. Limited history due to language barrier and cognitive impairment..  -Multiple frequent hospitalizations with heart failure exacerbation, poor compliance with meds readmitted with dyspnea on exertion, on exam in the ED he had 3+ edema, chest x-ray noted increased pulmonary vascular congestion and small pleural effusions  -Diuresed with IV Lasix -Hospital course course complicated by extremely poor insight, cognitive deficits, noncompliance with medications  Assessment & Plan:   Acute on chronic systolic heart failure exacerbation. Ischemic cardiomyopathy.  Coronary artery disease -11/2021 echocardiogram with reduced LV systolic function EF 30 to 35% with global hypokinesis, RV systolic function preserved. Moderate pulmonary hypertension.  Not candidate for revascularization per CT surgery 01/2021. (Old records reviewed). -Diuresed with IV Lasix earlier this admission, currently euvolemic -Continue carvedilol, bidil, torsemide, empagliflozin -Continue aspirin, Plavix, statin -Unfortunately patient continues to frequently refuse medications, vital signs etc.  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  Urinary retention has resolved continue with finasteride and tamsulosin. Foley  cathter has been discontinued   Moderate calorie protein malnutrition. -Continue supplements  Suspected gout flare -Trial of colchicine  DVT prophylaxis: Enoxaparin   Code Status:    full  Family Communication:   No family at the bedside, left msg for son Randy [name]    Nutrition Status: Nutrition Problem: Moderate Malnutrition Etiology: chronic illness (CHF) Signs/Symptoms: moderate fat depletion, severe muscle depletion Interventions: Ensure Enlive (each supplement provides 350kcal and 20 grams of protein), Liberalize Diet    Subjective: -Complains of pain in his right ankle, foot, great toe   Objective: Vitals:   01/06/22 2018 01/07/22 0325 01/07/22 0734 01/08/22 0900  BP: 110/80  (!) 111/44 (!) 114/44  Pulse: 77  61   Resp: 18  18   Temp: 98.4 F (36.9 C)  97.9 F (36.6 C)   TempSrc: Oral  Oral   SpO2: 97%  95%   Weight:  54.8 kg    Height:        Intake/Output Summary (Last 24 hours) at 01/08/2022 1347 Last data filed at 01/08/2022 1309 Gross per 24 hour  Intake 1280 ml  Output 375 ml  Net 905 ml   Filed Weights   01/05/22 0436 01/06/22 0500 01/07/22 0325  Weight: 55 kg 54 kg 54.8 kg    Examination:   Elderly male sitting up in bed, awake alert, oriented to self and place, only partly to time, moderate cognitive deficits HEENT: No JVD CVS: S1-S2, regular rate rhythm Lungs: Clear bilaterally Abdomen: Soft, nontender, bowel sounds present Extremities: No edema Musculoskeletal: no joint deformities     Data Reviewed: I have personally reviewed following labs and imaging studies  CBC: No results for input(s): WBC, NEUTROABS, HGB, HCT, MCV, PLT in the last 168 hours.  Basic Metabolic Panel: Recent Labs  Lab 01/02/22 0350 01/04/22 0411  NA 133* 138  K 3.5 3.4*  CL 98 102  CO2 25 24  GLUCOSE 98 136*  BUN 34* 33*  CREATININE 2.24* 2.00*  CALCIUM 8.1* 8.1*   GFR: Estimated Creatinine Clearance: 22 mL/min (A) (by C-G formula based on SCr of 2  mg/dL (H)). Liver Function Tests: No results for input(s): AST, ALT, ALKPHOS, BILITOT, PROT, ALBUMIN in the last 168 hours. No results for input(s): LIPASE, AMYLASE in the last 168 hours. No results for input(s): AMMONIA in the last 168 hours. Coagulation Profile: No results for input(s): INR, PROTIME in the last 168 hours. Cardiac Enzymes: No results for input(s): CKTOTAL, CKMB, CKMBINDEX, TROPONINI in the last 168 hours. BNP (last 3 results) No results for input(s): PROBNP in the last 8760 hours. HbA1C: No results for input(s): HGBA1C in the last 72 hours. CBG: Recent Labs  Lab 01/03/22 0607 01/03/22 2101 01/04/22 0555 01/05/22 0612 01/06/22 1119  GLUCAP 130* 144* 130* 148* 202*   Lipid Profile: No results for input(s): CHOL, HDL, LDLCALC, TRIG, CHOLHDL, LDLDIRECT in the last 72 hours. Thyroid Function Tests: No results for input(s): TSH, T4TOTAL, FREET4, T3FREE, THYROIDAB in the last 72 hours. Anemia Panel: No results for input(s): VITAMINB12, FOLATE, FERRITIN, TIBC, IRON, RETICCTPCT in the last 72 hours.  Scheduled Meds:  aspirin EC  81 mg Oral Daily   atorvastatin  80 mg Oral Daily   carvedilol  6.25 mg Oral BID WC   clopidogrel  75 mg Oral Daily   colchicine  0.6 mg Oral Daily   docusate sodium  100 mg Oral BID   empagliflozin  10 mg Oral Daily   enoxaparin (LOVENOX) injection  30 mg Subcutaneous Q24H   feeding supplement  237 mL Oral Q24H   finasteride  5 mg Oral Daily   isosorbide-hydrALAZINE  1 tablet Oral q12n4p   pantoprazole  40 mg Oral Daily   sodium chloride flush  3 mL Intravenous Q12H   tamsulosin  0.8 mg Oral Daily   torsemide  40 mg Oral Daily   Continuous Infusions:   LOS: 7 days        Randy Polite, MD

## 2022-01-08 NOTE — TOC Progression Note (Addendum)
Transition of [name] [name]) - Progression Note    Patient Details  Name: Randy [name] MRN: [id number] Date of Birth: 05/05/1941  Transition of [name] [facility]) CM/SW Contact  Randy Sturkey, LCSW Phone Number: 01/08/2022, 3:59 PM  Clinical Narrative:    3:57pm - HF CSW attempted to contact [name]'s daughter Randy [name] [phone] about the plan for discharge to a memory [name] facility in [place] however she didn't answer the call and CSW left a voicemail for her to return the call.  4:01pm - HF CSW spoke with the patient's son, Randy [name] [phone] and he stated that he lives in [place]. CSW informed him about the plan to discharge his father to a memory [name] facility in  [place], [place] and he asked for the CSW to text him that information. Randy [name] reported that his sister lives in [place], [place] and [place] informed him that a voicemail was left with her to update her about the discharge plan.  HF CSW attempted to update FL2 to send to memory [name] facility in [place] however PASRR pending and needing to upload clinicals. CSW uploaded clinicals to [place] [facility] for pending PASRR#. PASRR# still pending as of 5:15pm Randy [name] asking for insurance authorization from Randy [name]. Pending insurance authorization. [place] authorization ID#: [id number] for dates 01/08/2022 - 01/09/2022 Status: Pending  5:57pm - HF CSW received a call from [employer] about the insurance authorization asking for clarification about the need. CSW informed [employer] about the memory [name] need and [employer] reported the authorization will be sent to peer to peer for further review.  CSW will continue to follow throughout discharge.   Expected Discharge Plan: [place] Barriers to Discharge: Continued Medical Work up  Expected Discharge Plan and Services Expected Discharge Plan: [place] In-house Referral: NA Discharge Planning Services: CM  Consult Post Acute [name] Choice: Resumption of Svcs/PTA Provider Living arrangements for the past 2 months: Homeless                   DME Agency: NA       HH Arranged: RN, PT, Disease Management [place] Agency: [place] Date [place]: 01/02/22 Time [place]: 6811 Representative spoke with at [place]: [place] Determinants of Health ([place]) Interventions    Readmission Risk Interventions Readmission Risk Prevention Plan 01/02/2022 12/13/2021  Transportation Screening Complete Complete  Medication Review Press photographer) Complete Complete  PCP or Specialist appointment within 3-5 days of discharge Complete Complete  HRI or [place] Complete Complete  SW Recovery [name]/Counseling Consult Complete Complete  Palliative [name] Screening Not Applicable Not [place] Not Applicable Not Applicable  Some recent data might be hidden   Randy [name], MSW, LCSWA [phone] Heart Failure Social Worker

## 2022-01-08 NOTE — Progress Notes (Signed)
Patient is refusing to have vital signs taken and is also refusing to take medications.

## 2022-01-09 LAB — BASIC METABOLIC PANEL
Anion gap: 11 (ref 5–15)
BUN: 48 mg/dL — ABNORMAL HIGH (ref 8–23)
CO2: 25 mmol/L (ref 22–32)
Calcium: 8.9 mg/dL (ref 8.9–10.3)
Chloride: 98 mmol/L (ref 98–111)
Creatinine, Ser: 1.9 mg/dL — ABNORMAL HIGH (ref 0.61–1.24)
GFR, Estimated: 35 mL/min — ABNORMAL LOW (ref >60)
Glucose, Bld: 330 mg/dL — ABNORMAL HIGH (ref 70–99)
Potassium: 4.9 mmol/L (ref 3.5–5.1)
Sodium: 134 mmol/L — ABNORMAL LOW (ref 135–145)

## 2022-01-09 LAB — GLUCOSE, CAPILLARY: Glucose-Capillary: 171 mg/dL — ABNORMAL HIGH (ref 70–99)

## 2022-01-09 MED ORDER — ISOSORB DINITRATE-HYDRALAZINE 20-37.5 MG PO TABS: 1.0000 | ORAL_TABLET | Freq: Two times a day (BID) | ORAL | Status: DC

## 2022-01-09 MED ORDER — INSULIN ASPART 100 UNIT/ML IJ SOLN
3.0000 [IU] | Freq: Three times a day (TID) | INTRAMUSCULAR | Status: DC
  Administered 2022-01-09 – 2022-01-10 (×3): 3 [IU] via SUBCUTANEOUS

## 2022-01-09 MED ORDER — TORSEMIDE 40 MG PO TABS: 40.0000 mg | ORAL_TABLET | Freq: Every day | ORAL | 0 refills | Status: DC

## 2022-01-09 MED ORDER — POLYETHYLENE GLYCOL 3350 17 G PO PACK: 17.0000 g | PACK | Freq: Every day | ORAL | 0 refills | Status: DC | PRN

## 2022-01-09 NOTE — TOC Progression Note (Addendum)
Transition of Care [facility]) - Progression Note    Patient Details  Name: Randy [name] MRN: [id number] Date of Birth: [birth year]  Transition of Care [facility]) CM/SW Contact  Shermaine Brigham, LCSW Phone Number: 01/09/2022, 2:26 PM  Clinical Narrative:    2:22pm - HF CSW received a call from [facility] about the insurance authorization going to peer to peer before 10am tomorrow 01/10/22 and for the MD to call [phone] option 5 and provide the patients name, DOB and Member ID#: [id number]. Information provided to Dr. Broadus John. Dr. Broadus John called for the peer to peer and sounds like the insurance was denied.  2:49pm - HF CSW spoke with [name] at [facility] [phone] and agree that tomorrow will be best for discharge with transportation being back up already and not have him transported in the middle of the night with cognitive concerns.  4:32pm - HF CSW received a call from [facility] reporting that the peer to peer was denied and the appeal number is [phone].  Mr. Narine to discharge tomorrow to [facility] memory care facility.  CSW will continue to follow throughout discharge.   Expected Discharge Plan: [place] Barriers to Discharge: Continued Medical Work up  Expected Discharge Plan and Services Expected Discharge Plan: [place] In-house Referral: NA Discharge Planning Services: CM Consult Post Acute Care Choice: Resumption of Svcs/PTA Provider Living arrangements for the past 2 months: Homeless                   DME Agency: NA       HH Arranged: RN, PT, Disease Management [place] Agency: [place] Date [place]: 01/02/22 Time [place]: 5009 Representative spoke with at [place]: [place] Determinants of Health ([place]) Interventions    Readmission Risk Interventions Readmission Risk Prevention Plan 01/02/2022 12/13/2021  Transportation Screening Complete Complete   Medication Review Press photographer) Complete Complete  PCP or Specialist appointment within 3-5 days of discharge Complete Complete  HRI or [place] Complete Complete  SW Recovery Care/Counseling Consult Complete Complete  Palliative Care Screening Not Applicable Not [place] Not Applicable Not Applicable  Some recent data might be hidden   Ceri Mayer, MSW, LCSWA [phone] Heart Failure Social Worker

## 2022-01-09 NOTE — TOC Progression Note (Addendum)
Transition of Care [facility]) - Progression Note    Patient Details  Name: Randy [name] MRN: [id number] Date of Birth: [birth year]  Transition of Care [facility]) CM/SW Contact  Randy Regner, LCSW Phone Number: 01/09/2022, 9:17 AM  Clinical Narrative:    HF CSW received a call back from the patients daughter, Randy [name] [phone] and CSW updated her about the discharge plan for her father to go to a memory care facility in [place], [place]. Randy [name] reported understanding the plan and is agreeable for her dad to go to a memory care unit and she had no other questions or concerns at this time and has the CSW's phone number if anything changes.  9:40am - PASRR pending need to upload updated FL2 now with SNF instead of memory care listed per request of [place] [facility]. 10:23am- updated FL2 uploaded to [place] Must pending PASRR# for memory care facility.11:55am - PASRR# approved [id number]  9:45am -  Insurance authorization still pending on [employer] online portal. 11:58am - Insurance authorization still pending on [employer] online portal.  12:14pm - HF CSW updated Randy [name] at [facility] [phone] about the PASRR# getting approved and just waiting on the insurance authorization before patient is able to discharge to the memory facility.  12:16pm - HF CSW updated APS worker Randy [name] [phone] about the discharge plan for Mr. Altemose to go to a memory care facility in [place], [place]. [name] would like to be informed when the patient leaves the hospital so she can close her case.   CSW will continue to follow throughout discharge.   Expected Discharge Plan: [place] Barriers to Discharge: Continued Medical Work up  Expected Discharge Plan and Services Expected Discharge Plan: [place] In-house Referral: NA Discharge Planning Services: CM Consult Post Acute Care Choice: Resumption of Svcs/PTA Provider Living arrangements for the past 2 months: Homeless                    DME Agency: NA       HH Arranged: RN, PT, Disease Management [place] Agency: [place] Date [place]: 01/02/22 Time [place]: 1638 Representative spoke with at [place]: [place] ([place]) Interventions    Readmission Risk Interventions Readmission Risk Prevention Plan 01/02/2022 12/13/2021  Transportation Screening Complete Complete  Medication Review Press photographer) Complete Complete  PCP or Specialist appointment within 3-5 days of discharge Complete Complete  HRI or [place] Complete Complete  SW Recovery Care/Counseling Consult Complete Complete  Palliative Care Screening Not Applicable Not [place] Not Applicable Not Applicable  Some recent data might be hidden

## 2022-01-09 NOTE — [place] FL2 (Addendum)
[place] MEDICAID FL2 LEVEL OF CARE SCREENING TOOL     IDENTIFICATION  Patient Name: Randy [name] Birthdate: 07-02-1941 Sex: male Admission Date (Current Location): 12/30/2021  [place] and [place] Number:  Kathleen Argue [id number] [place] and Address:  [facility] [place], [place] [address], [place], [place] [address]      Provider Number: [id number]  Attending Physician Name and Address:  Domenic Polite, MD  Relative Name and Phone Number:  Harlene Ramus #[phone], DGUY [id number] [phone]    Current Level of Care: Hospital Recommended Level of Care: [place] Prior Approval Number:     Date Approved/Denied:   PASRR Number: [id number]  Discharge Plan: SNF    Current Diagnoses: Patient Active Problem List   Diagnosis Date Noted   Malnutrition of moderate degree 01/02/2022   Heart failure ([place]) 01/01/2022   Acute on chronic systolic (congestive) heart failure ([place]) 12/31/2021   Diarrhea 12/31/2021   Ischemic cardiomyopathy 12/14/2021   CHF exacerbation ([place]) 12/12/2021   SOB (shortness of breath) 11/22/2021   Elevated troponin 11/22/2021   Hyperglycemia 11/22/2021   Blood creatinine increased compared with prior measurement 11/22/2021   Acute respiratory failure with hypoxia and hypercapnia ([place]) 11/21/2021   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   Urinary tract infection 11/16/2021   Microcytic anemia 11/16/2021   Thrombocytosis 11/16/2021   Bradycardia 07/05/2021   Hematuria 07/05/2021   Anemia 07/05/2021   Acute on chronic systolic CHF (congestive heart failure) ([place]) 07/01/2021   CAD, 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   Stage 3b chronic kidney disease ([place])    Primary hypertension    Mixed hyperlipidemia    AKI-CKD 3B    NSTEMI (non-ST elevated myocardial infarction) ([place])    Hypertensive crisis 01/29/2021   Acute respiratory failure with hypoxia ([place]) 01/29/2021    Orientation RESPIRATION  BLADDER Height & Weight     Self  Normal Continent Weight: 121 lb 4.1 oz (55 kg) Height:  5\' 2"  (157.5 cm)  BEHAVIORAL SYMPTOMS/MOOD NEUROLOGICAL BOWEL NUTRITION STATUS      Continent Diet (Please See DC Summary)  AMBULATORY STATUS COMMUNICATION OF NEEDS Skin   Limited Assist Verbally (Laotian, limited English) Normal                       Personal Care Assistance Level of Assistance  Feeding, Bathing, Dressing Bathing Assistance: Limited assistance Feeding assistance: Independent Dressing Assistance: Limited assistance     Functional Limitations Info  Sight, Hearing, Speech Sight Info: Impaired Hearing Info: Adequate Speech Info: Adequate    SPECIAL CARE FACTORS FREQUENCY  PT (By licensed PT), OT (By licensed OT)     PT Frequency: 3x/week OT Frequency: 3x/week            Contractures Contractures Info: Not present    Additional Factors Info  Code Status, Allergies Code Status Info: FULL Allergies Info: No Known Allergies           Current Medications (01/09/2022):  This is the current hospital active medication list Current Facility-Administered Medications  Medication Dose Route Frequency Provider Last Rate Last Admin   acetaminophen (TYLENOL) tablet 650 mg  650 mg Oral Q6H PRN Karmen Bongo, MD   650 mg at 01/08/22 0816   Or   acetaminophen (TYLENOL) suppository 650 mg  650 mg Rectal Q6H PRN Karmen Bongo, MD       aspirin EC tablet 81 mg  81 mg Oral Daily Karmen Bongo, MD   81 mg at 01/09/22  4827   atorvastatin (LIPITOR) tablet 80 mg  80 mg Oral Daily Karmen Bongo, MD   80 mg at 01/09/22 0786   bisacodyl (DULCOLAX) EC tablet 5 mg  5 mg Oral Daily PRN Karmen Bongo, MD       carvedilol (COREG) tablet 6.25 mg  6.25 mg Oral BID WC Karmen Bongo, MD   6.25 mg at 01/09/22 0900   clopidogrel (PLAVIX) tablet 75 mg  75 mg Oral Daily Karmen Bongo, MD   75 mg at 01/09/22 7544   colchicine tablet 0.6 mg  0.6 mg Oral Daily Domenic Polite, MD    0.6 mg at 01/09/22 9201   docusate sodium (COLACE) capsule 100 mg  100 mg Oral BID Karmen Bongo, MD   100 mg at 01/09/22 0071   empagliflozin (JARDIANCE) tablet 10 mg  10 mg Oral Daily Arrien, Jimmy Picket, MD   10 mg at 01/09/22 0904   enoxaparin (LOVENOX) injection 30 mg  30 mg Subcutaneous Q24H Karmen Bongo, MD   30 mg at 01/06/22 2252   feeding supplement (ENSURE ENLIVE / ENSURE PLUS) liquid 237 mL  237 mL Oral Q24H Tawni Millers, MD   237 mL at 01/08/22 2024   finasteride (PROSCAR) tablet 5 mg  5 mg Oral Daily Karmen Bongo, MD   5 mg at 01/09/22 0904   isosorbide-hydrALAZINE (BIDIL) 20-37.5 MG per tablet 1 tablet  1 tablet Oral q12n4p Barton Dubois, MD   1 tablet at 01/06/22 1216   Muscle Rub CREA   Topical PRN Shalhoub, Sherryll Burger, MD   Given at 01/08/22 2025   pantoprazole (PROTONIX) EC tablet 40 mg  40 mg Oral Daily Arrien, Jimmy Picket, MD   40 mg at 01/09/22 0903   polyethylene glycol (MIRALAX / GLYCOLAX) packet 17 g  17 g Oral Daily PRN Karmen Bongo, MD       tamsulosin [facility]) capsule 0.8 mg  0.8 mg Oral Daily Karmen Bongo, MD   0.8 mg at 01/09/22 0911   torsemide (DEMADEX) tablet 40 mg  40 mg Oral Daily Arrien, Jimmy Picket, MD   40 mg at 01/09/22 2197   traZODone (DESYREL) tablet 25 mg  25 mg Oral QHS PRN Karmen Bongo, MD   25 mg at 01/05/22 2049     Discharge Medications: Please see discharge summary for a list of discharge medications.  Relevant Imaging Results:  Relevant Lab Results:   Additional Information SSN#: [id number] [place], LCSW

## 2022-01-09 NOTE — Progress Notes (Signed)
Patient refused to have labs drawn. Will try later this AM

## 2022-01-09 NOTE — Discharge Summary (Addendum)
Physician Discharge Summary  Randy [name] XNA:[id number] DOB: Nov 20, 1941 DOA: 12/30/2021  PCP: Jilda Panda, MD  Admit date: 12/30/2021 Discharge date: 01/10/2022  Time spent: 35 minutes  Recommendations for Outpatient Follow-up:  PCP Dr. Mellody Drown in 1 week, needs BMP at follow-up Cardiology Dr. Gardiner Rhyme in 2-3weeks  Discharge Diagnoses:  Principal Problem:   Acute on chronic systolic (congestive) heart failure ([place]) Active Problems:   CAD, multiple vessel   Stage 3b chronic kidney disease ([place])   Mixed hyperlipidemia   Urinary retention due to benign prostatic hyperplasia   Hyperglycemia   Diarrhea   Heart failure (HCC)   Malnutrition of moderate degree DO NOT RESUSCITATE  Discharge Condition: Stable  Diet recommendation: Low-sodium, heart healthy  Filed Weights   01/06/22 0500 01/07/22 0325 01/09/22 0545  Weight: 54 kg 54.8 kg 55 kg    History of present illness:  81 yo male with the past medical history of chronic systolic heart failure, CAD, and HTN who presented with dyspnea. Limited history due to language barrier and cognitive impairment..  -Multiple frequent hospitalizations with heart failure exacerbation, poor compliance with meds readmitted with dyspnea on exertion, on exam in the ED he had 3+ edema, chest x-ray noted increased pulmonary vascular congestion and small pleural effusions  -Diuresed with IV Lasix -Hospital course course complicated by extremely poor insight, cognitive deficits, noncompliance with medications  Hospital Course:   Acute on chronic systolic heart failure exacerbation. Ischemic cardiomyopathy.  Coronary artery disease -11/2021 echocardiogram with reduced LV systolic function EF 30 to 35% with global hypokinesis, RV systolic function preserved. Moderate pulmonary hypertension.  Not candidate for revascularization per CT surgery 01/2021. (Old records reviewed). -Diuresed with IV Lasix earlier this admission, currently  euvolemic -Subsequently transitioned to oral torsemide 40 Mg daily  -continue carvedilol, bidil, torsemide, empagliflozin -Continue aspirin, Plavix, statin -Unfortunately patient continues to frequently refuse medications, vital signs etc. 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 -Needs higher level of care, supervision and assistance -TOC following for safe disposition, plan for discharge to memory care unit -B12 was normal in 12/22, ordered TSH on multiple occasions, however patient refused blood draws on all those days, TSH in 2022 was normal, consider repeat when able -Discussed CODE STATUS with patient's son after multiple attempts today, he was agreeable to change to DNR   T2DM  -CBGS stable, metformin, Jardiance resumed   Urinary retention due to BPH.  Urinary retention has resolved continue with finasteride and tamsulosin. Foley cathter has been discontinued    Moderate calorie protein malnutrition. -Continue supplements   Suspected gout flare -Improved, treated with colchicine  Consultations: Cardiology  Discharge Exam: Vitals:   01/08/22 2000 01/09/22 0545  BP: (!) 105/91 127/65  Pulse: 62   Resp:    Temp: 98.6 F (37 C) 98.6 F (37 C)  SpO2: 96% 98%    General: Awake alert, oriented x2, irritable Cardiovascular: S1-S2, regular rate rhythm Respiratory: Clear  Discharge Instructions    Allergies as of 01/09/2022   No Known Allergies      Medication List     STOP taking these medications     furosemide  80 MG tablet Commonly known as: LASIX       TAKE these medications    acetaminophen 325 MG tablet Commonly known as: TYLENOL Take 2 tablets (650 mg total) by mouth every 4 (four) hours as needed for headache or mild pain.   Aspirin Low Dose 81 MG EC tablet Generic drug: aspirin Take 1 tablet (81 mg total) by mouth daily. Swallow whole.   atorvastatin 80 MG tablet Commonly known as: LIPITOR Take 1 tablet (80 mg total) by mouth daily.   carvedilol 6.25 MG tablet Commonly known as: COREG Take 1 tablet (6.25 mg total) by mouth 2 (two) times daily with a meal.   clopidogrel 75 MG tablet Commonly known as: PLAVIX Take 1 tablet (75 mg total) by mouth daily.   FeroSul 325 (65 FE) MG tablet Generic drug: ferrous sulfate Take 1 tablet (325 mg total) by mouth 2 (two) times daily with a meal.   finasteride 5 MG tablet Commonly known as: PROSCAR Take 1 tablet (5 mg total) by mouth daily.   isosorbide-hydrALAZINE 20-37.5 MG tablet Commonly known as: BIDIL Take 1 tablet by mouth 2 (two) times daily. What changed: when to take this   Jardiance 10 MG Tabs tablet Generic drug: empagliflozin Take 10 mg by mouth daily.   metFORMIN 500 MG tablet Commonly known as: GLUCOPHAGE Take 500 mg by mouth in the morning and at bedtime.   nitroGLYCERIN 0.4 MG SL tablet Commonly known as: NITROSTAT Place 1 tablet (0.4 mg total) under the tongue every 5 (five) minutes x 3 doses as needed for chest pain.   pantoprazole 40 MG tablet Commonly known as: PROTONIX Take 1 tablet (40 mg total) by mouth 2 (two) times daily before a meal.   polyethylene glycol 17 g packet Commonly known as: MIRALAX / GLYCOLAX Take 17 g by mouth daily as needed for mild constipation.   tamsulosin 0.4 MG Caps capsule Commonly known as: FLOMAX Take 2 capsules (0.8 mg total) by mouth daily.   Torsemide 40 MG Tabs Take 40 mg by mouth daily. Start taking on: January 10, 2022   vitamin B-12 500 MCG  tablet Commonly known as: CYANOCOBALAMIN Take 1 tablet (500 mcg total) by mouth daily.       No Known Allergies  Follow-up Information     Care, [facility] Follow up.   Why: HHRN, HHPT Contact information: [address] [place] [address] [phone]         Jilda Panda, MD. Go on 01/08/2022.   Specialty: Internal Medicine Why: @2 :45pm Contact information: [address] [place] [address] [phone]         Donato Heinz, MD .   Specialties: Cardiology, Radiology Contact information: [address] [place] [address] [phone]                  The results of significant diagnostics from this hospitalization (including imaging, microbiology, ancillary and laboratory) are listed below for reference.    Significant Diagnostic Studies: CT ABDOMEN PELVIS WO CONTRAST  Result Date: 12/19/2021 CLINICAL DATA:  Abdominal pain and constipation. EXAM: CT ABDOMEN AND PELVIS WITHOUT CONTRAST TECHNIQUE: Multidetector CT imaging of the abdomen and pelvis was performed following the standard protocol without IV contrast. COMPARISON:  September 20, 2021 FINDINGS: Lower chest: Moderate-severity atelectatic changes are seen within the bilateral lung bases. Small bilateral pleural effusions are noted. Hepatobiliary: Subcentimeter calcified granulomas are seen within the right and left lobes of the liver. No gallstones,  gallbladder wall thickening, or biliary dilatation. Pancreas: Unremarkable. No pancreatic ductal dilatation or surrounding inflammatory changes. Spleen: Normal in size without focal abnormality. Adrenals/Urinary Tract: Adrenal glands are unremarkable. Kidneys are normal, without renal calculi, focal lesion, or hydronephrosis. Very mild, diffuse urinary bladder wall thickening is seen. Stomach/Bowel: Stomach is within normal limits. Appendix appears normal. Stool is seen throughout the large bowel. No evidence of  bowel wall thickening, distention, or inflammatory changes. Vascular/Lymphatic: Aortic atherosclerosis. No enlarged abdominal or pelvic lymph nodes. Reproductive: The prostate gland is markedly enlarged. Other: No abdominal wall hernia or abnormality. No abdominopelvic ascites. Musculoskeletal: There is grade 1 anterolisthesis of the L5 vertebral body on S1. Chronic bilateral pars defects are also seen at the level of L5-S1. Multilevel degenerative changes are noted throughout the remainder of the lumbar spine IMPRESSION: 1. Large stool burden, without evidence of bowel obstruction. 2. Moderate severity bibasilar atelectasis with small bilateral pleural effusions. 3. 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 4. Grade 1 anterolisthesis of the L5 vertebral body on S1. 5. Marked severity prostatomegaly. 6. Aortic atherosclerosis. Aortic Atherosclerosis (ICD10-I70.0). Electronically Signed   By: Virgina Norfolk M.D.   On: 12/19/2021 02:01   DG Chest 2 View  Result Date: 12/30/2021 CLINICAL DATA:  Chest pain with shortness of breath. EXAM: CHEST - 2 VIEW COMPARISON:  Chest x-ray 12/11/2021. FINDINGS: The heart is enlarged. There central pulmonary vascular congestion. There is no lung consolidation, there is no lung consolidation or pneumothorax. There is a small left pleural effusion. No acute fractures are seen. IMPRESSION: 1. Cardiomegaly with central pulmonary vascular congestion. 2. Small left pleural effusion. Electronically Signed   By: Ronney Asters M.D.   On: 12/30/2021 21:26   DG Chest 2 View  Result Date: 12/12/2021 CLINICAL DATA:  Shortness of breath and chest pain EXAM: CHEST - 2 VIEW COMPARISON:  11/27/2021 FINDINGS: Cardiac shadow is prominent but stable. Aortic calcifications are seen. Increased vascular congestion and edema is noted when compare with the prior exam. No effusion is seen. No bony abnormality is noted. IMPRESSION: Vascular congestion with increasing edema.  Electronically Signed   By: Inez Catalina M.D.   On: 12/12/2021 00:18   DG Ankle Complete Right  Result Date: 12/18/2021 CLINICAL DATA:  Right lower extremity pain. EXAM: RIGHT ANKLE - COMPLETE 3+ VIEW; RIGHT FOOT - 2 VIEW COMPARISON:  None. FINDINGS: No acute fracture or dislocation. The bones are well mineralized. Mild degenerative changes of the midfoot involving the tarsal bone. There is diffuse soft tissue swelling primarily involving the dorsum of the foot. No radiopaque foreign object or soft tissue gas. IMPRESSION: 1. No acute osseous pathology. 2. Diffuse soft tissue swelling of the foot. Electronically Signed   By: Anner Crete M.D.   On: 12/18/2021 22:47   DG Abdomen 1 View  Result Date: 12/18/2021 CLINICAL DATA:  Abdominal pain and constipation. EXAM: ABDOMEN - 1 VIEW COMPARISON:  CT scan 09/20/2021 FINDINGS: Moderate stool burden. No findings for small bowel obstruction or free air. The soft tissue shadows are maintained. No worrisome calcifications. The bony structures are unremarkable. IMPRESSION: Moderate stool burden. Electronically Signed   By: Marijo Sanes M.D.   On: 12/18/2021 18:06   CT Foot Right Wo Contrast  Result Date: 12/19/2021 CLINICAL DATA:  Right foot swelling. Concern for acute osteomyelitis. EXAM: CT OF THE RIGHT FOOT WITHOUT CONTRAST TECHNIQUE: Multidetector CT imaging of the right foot was performed according to the standard protocol. Multiplanar CT image reconstructions were also generated.  COMPARISON:  Right foot radiograph dated 12/18/2021. FINDINGS: Bones/Joint/Cartilage No acute fracture or dislocation. Mild spurring of the dorsum of the midfoot. No bone erosion or periosteal elevation. Ligaments Suboptimally assessed by CT. Muscles and Tendons No acute intramuscular fluid collection. Soft tissues There is diffuse subcutaneous edema predominantly involving the dorsum of the right foot. No soft tissue gas. No drainable fluid collection or abscess. IMPRESSION:  1. No acute fracture or dislocation. No evidence of acute osteomyelitis by CT. 2. Diffuse subcutaneous edema predominantly involving the dorsum of the right foot. No drainable fluid collection or abscess. Electronically Signed   By: Anner Crete M.D.   On: 12/19/2021 01:57   DG Foot 2 Views Right  Result Date: 12/18/2021 CLINICAL DATA:  Right lower extremity pain. EXAM: RIGHT ANKLE - COMPLETE 3+ VIEW; RIGHT FOOT - 2 VIEW COMPARISON:  None. FINDINGS: No acute fracture or dislocation. The bones are well mineralized. Mild degenerative changes of the midfoot involving the tarsal bone. There is diffuse soft tissue swelling primarily involving the dorsum of the foot. No radiopaque foreign object or soft tissue gas. IMPRESSION: 1. No acute osseous pathology. 2. Diffuse soft tissue swelling of the foot. Electronically Signed   By: Anner Crete M.D.   On: 12/18/2021 22:47   CT RENAL STONE STUDY  Result Date: 12/31/2021 CLINICAL DATA:  Flank pain.  Concern for kidney stone. EXAM: CT ABDOMEN AND PELVIS WITHOUT CONTRAST TECHNIQUE: Multidetector CT imaging of the abdomen and pelvis was performed following the standard protocol without IV contrast. RADIATION DOSE REDUCTION: This exam was performed according to the departmental dose-optimization program which includes automated exposure control, adjustment of the mA and/or kV according to patient size and/or use of iterative reconstruction technique. COMPARISON:  CT abdomen pelvis dated 12/19/2021. FINDINGS: Evaluation of this exam is limited in the absence of intravenous contrast. Lower chest: Partially visualized small right and trace left pleural effusions. There is diffuse interstitial and interlobular septal prominence and hazy airspace density throughout the lungs consistent with edema. Pneumonia is not excluded. There is cardiomegaly with coronary vascular calcification. There is hypoattenuation of the cardiac blood pool suggestive of anemia. Clinical  correlation is recommended. No intra-abdominal free air.  Small ascites. Hepatobiliary: Several small scattered calcified liver granuloma. No intrahepatic biliary dilatation. The gallbladder is unremarkable. Pancreas: Unremarkable. No pancreatic ductal dilatation or surrounding inflammatory changes. Spleen: Normal in size without focal abnormality. Adrenals/Urinary Tract: The adrenal glands are unremarkable. Vascular calcification versus less likely a 4 mm nonobstructing left renal interpolar calculus. No obstructing stone. There is no stone on the right. Mild fullness of the renal collecting systems bilaterally. The visualized ureters are unremarkable. The urinary bladder is decompressed around a Foley catheter. Stomach/Bowel: There is no bowel obstruction or active inflammation. The appendix is normal. Vascular/Lymphatic: Advanced aortoiliac atherosclerotic disease. The aorta is ectatic measuring up to 2.3 cm. The IVC is unremarkable. No portal venous gas. There is no adenopathy. Reproductive: The prostate gland is enlarged measuring 6.3 cm in transverse axial diameter. Other: Diffuse mesenteric and subcutaneous edema and anasarca. Musculoskeletal: Bilateral L5 pars defects with grade 1 L5-S1 anterolisthesis. Multilevel degenerative changes of the spine. No acute osseous pathology. IMPRESSION: 1. No acute intra-abdominal or pelvic pathology. No obstructing stone. Mild fullness of the renal collecting systems bilaterally. 2. Cardiomegaly with findings of CHF and small bilateral pleural effusions. 3. Small ascites, and anasarca. 4. Aortic Atherosclerosis (ICD10-I70.0). Electronically Signed   By: Anner Crete M.D.   On: 12/31/2021 20:55   DG Hip Unilat  W or Wo Pelvis 2-3 Views Right  Result Date: 12/18/2021 CLINICAL DATA:  Right-sided hip pain EXAM: DG HIP (WITH OR WITHOUT PELVIS) 2-3V RIGHT COMPARISON:  None. FINDINGS: SI joints are non widened. Pubic symphysis and rami appear intact. No fracture or  malalignment. The joint space is patent. IMPRESSION: Negative. Electronically Signed   By: Donavan Foil M.D.   On: 12/18/2021 23:20    Microbiology: Recent Results (from the past 240 hour(s))  Urine Culture     Status: Abnormal   Collection Time: 12/31/21  6:47 PM   Specimen: Urine, Clean Catch  Result Value Ref Range Status   Specimen Description URINE, CLEAN CATCH  Final   Special Requests   Final    NONE Performed at [facility], [place] [address], [place], [place] [address]    Culture 90,000 COLONIES/mL ENTEROCOCCUS FAECALIS (A)  Final   Report Status 01/02/2022 FINAL  Final   Organism ID, Bacteria ENTEROCOCCUS FAECALIS (A)  Final      Susceptibility   Enterococcus faecalis - MIC*    AMPICILLIN <=2 SENSITIVE Sensitive     NITROFURANTOIN <=16 SENSITIVE Sensitive     VANCOMYCIN 1 SENSITIVE Sensitive     * 90,000 COLONIES/mL ENTEROCOCCUS FAECALIS  Resp Panel by RT-PCR (Flu A&B, Covid) Nasopharyngeal Swab     Status: None   Collection Time: 12/31/21 11:05 PM   Specimen: Nasopharyngeal Swab; Nasopharyngeal(NP) swabs in vial transport medium  Result Value Ref Range Status   SARS Coronavirus 2 by RT PCR NEGATIVE NEGATIVE Final    Comment: (NOTE) SARS-CoV-2 target nucleic acids are NOT DETECTED.  The SARS-CoV-2 RNA is generally detectable in upper respiratory specimens during the acute phase of infection. The lowest concentration of SARS-CoV-2 viral copies this assay can detect is 138 copies/mL. A negative result does not preclude SARS-Cov-2 infection and should not be used as the sole basis for treatment or other patient management decisions. A negative result may occur with  improper specimen collection/handling, submission of specimen other than nasopharyngeal swab, presence of viral mutation(s) within the areas targeted by this assay, and inadequate number of viral copies(<138 copies/mL). A negative result must be combined with clinical observations, patient history, and  epidemiological information. The expected result is Negative.  Fact Sheet for Patients:  EntrepreneurPulse.com.au  Fact Sheet for Healthcare Providers:  IncredibleEmployment.be  This test is no t yet approved or cleared by the [place] FDA and  has been authorized for detection and/or diagnosis of SARS-CoV-2 by FDA under an Emergency Use Authorization (EUA). This EUA will remain  in effect (meaning this test can be used) for the duration of the COVID-19 declaration under Section 564(b)(1) of the Act, 21 U.S.C.section 360bbb-3(b)(1), unless the authorization is terminated  or revoked sooner.       Influenza A by PCR NEGATIVE NEGATIVE Final   Influenza B by PCR NEGATIVE NEGATIVE Final    Comment: (NOTE) The Xpert Xpress SARS-CoV-2/FLU/RSV plus assay is intended as an aid in the diagnosis of influenza from Nasopharyngeal swab specimens and should not be used as a sole basis for treatment. Nasal washings and aspirates are unacceptable for Xpert Xpress SARS-CoV-2/FLU/RSV testing.  Fact Sheet for Patients: EntrepreneurPulse.com.au  Fact Sheet for Healthcare Providers: IncredibleEmployment.be  This test is not yet approved or cleared by the [place] FDA and has been authorized for detection and/or diagnosis of SARS-CoV-2 by FDA under an Emergency Use Authorization (EUA). This EUA will remain in effect (meaning this test can be used) for  the duration of the COVID-19 declaration under Section 564(b)(1) of the Act, 21 U.S.C. section 360bbb-3(b)(1), unless the authorization is terminated or revoked.  Performed at [facility], [place] [address] [place], [place] [address]   C Difficile Quick Screen w PCR reflex     Status: None   Collection Time: 01/01/22 12:15 AM   Specimen: STOOL  Result Value Ref Range Status   C Diff antigen NEGATIVE NEGATIVE Final   C Diff toxin NEGATIVE NEGATIVE Final   C  Diff interpretation No C. difficile detected.  Final    Comment: Performed at [facility], [place] [address], [place], [place] [address]     Labs: Basic Metabolic Panel: Recent Labs  Lab 01/04/22 0411 01/09/22 0903  NA 138 134*  K 3.4* 4.9  CL 102 98  CO2 24 25  GLUCOSE 136* 330*  BUN 33* 48*  CREATININE 2.00* 1.90*  CALCIUM 8.1* 8.9   Liver Function Tests: No results for input(s): AST, ALT, ALKPHOS, BILITOT, PROT, ALBUMIN in the last 168 hours. No results for input(s): LIPASE, AMYLASE in the last 168 hours. No results for input(s): AMMONIA in the last 168 hours. CBC: No results for input(s): WBC, NEUTROABS, HGB, HCT, MCV, PLT in the last 168 hours. Cardiac Enzymes: No results for input(s): CKTOTAL, CKMB, CKMBINDEX, TROPONINI in the last 168 hours. BNP: BNP (last 3 results) Recent Labs    11/28/21 0301 12/11/21 2359 12/31/21 1313  BNP 3,057.4* 2,635.7* >4,500.0*    ProBNP (last 3 results) No results for input(s): PROBNP in the last 8760 hours.  CBG: Recent Labs  Lab 01/03/22 0607 01/03/22 2101 01/04/22 0555 01/05/22 0612 01/06/22 1119  GLUCAP 130* 144* 130* 148* 202*       Signed:  Domenic Polite MD.  [facility] 01/09/2022, 12:41 PM

## 2022-01-09 NOTE — Progress Notes (Signed)
Inpatient Diabetes Program Recommendations  AACE/ADA: New Consensus Statement on Inpatient Glycemic Control (2015)  Target Ranges:  Prepandial:   less than 140 mg/dL      Peak postprandial:   less than 180 mg/dL (1-2 hours)      Critically ill patients:  140 - 180 mg/dL   Lab Results  Component Value Date   GLUCAP 202 (H) 01/06/2022   HGBA1C 6.7 (H) 12/12/2021    Review of Glycemic Control  Latest Reference Range & Units 01/04/22 05:55 01/05/22 06:12 01/06/22 11:19  Glucose-Capillary 70 - 99 mg/dL 130 (H) 148 (H) 202 (H)   Diabetes history: DM 2 Outpatient Diabetes medications:  Jardiance 10 mg daily Metformin 500 mg bid  Current orders for Inpatient glycemic control:  Jardiance 10 mg daily  Inpatient Diabetes Program Recommendations:   If appropriate, consider adding Novolog sensitive tid with meals and HS.  Thanks,  Adah Perl, RN, BC-ADM Inpatient Diabetes Coordinator Pager [phone]  (8a-5p)

## 2022-01-10 NOTE — Care Management Important Message (Signed)
Important Message  Patient Details  Name: Randy [name] MRN: [id number] Date of Birth: [birth year]   Medicare Important Message Given:  Yes     [name] 01/10/2022, 9:55 AM

## 2022-01-10 NOTE — TOC Progression Note (Signed)
Transition of Care [facility]) - Progression Note    Patient Details  Name: Randy [name] MRN: [id number] Date of Birth: 1941-06-17  Transition of Care [facility]) CM/SW Contact  [name], LCSW Phone Number: 01/10/2022, 9:53 AM  Clinical Narrative:    HF CSW spoke with [name] from [place], [place] and she reported needing an updated DC summary with today's date. CSW made MD aware and awaiting DC summary.  9:41am - HF CSW spoke with the patient's daughter, [name] [phone] to let her know that her dad will be discharging today to the memory care facility in [place], [place] and will arrange transportation to get him there. Nori is agreeable and did not have any questions or concerns at this time.  CSW will continue to follow throughout discharge.   Expected Discharge Plan: [place] Barriers to Discharge: No Barriers Identified  Expected Discharge Plan and Services Expected Discharge Plan: [place] In-house Referral: NA Discharge Planning Services: CM Consult Post Acute Care Choice: Resumption of Svcs/PTA Provider Living arrangements for the past 2 months: Homeless                   DME Agency: NA       HH Arranged: RN, PT, Disease Management [place] Agency: [place] Date [place]: 01/02/22 Time [place]: 2595 Representative spoke with at [place]: [place] Determinants of Health ([place]) Interventions    Readmission Risk Interventions Readmission Risk Prevention Plan 01/02/2022 12/13/2021  Transportation Screening Complete Complete  Medication Review Press photographer) Complete Complete  PCP or Specialist appointment within 3-5 days of discharge Complete Complete  HRI or [place] Complete Complete  SW Recovery Care/Counseling Consult Complete Complete  Palliative Care Screening Not Applicable Not [place] Not Applicable Not Applicable  Some recent data  might be hidden

## 2022-01-10 NOTE — TOC Transition Note (Addendum)
Transition of Care [facility]) - CM/SW Discharge Note   Patient Details  Name: Randy [name] MRN: [id number] Date of Birth: [birth year]  [facility]) CM/SW Contact:  Tayanna Talford, LCSW Phone Number: 01/10/2022, 11:05 AM   Clinical Narrative:    Patient will DC to: [facility]  Anticipated DC date: 01/10/22 Family notified: yes, dtr, Nori Transport by: Corey Harold   Per MD patient ready for DC to [facility] rehab/memory care facility. RN to call report prior to discharge [phone] Room# [id number]). RN, patient, patient's family, and facility notified of DC. Discharge Summary and FL2 sent to facility. DC packet on chart. Ambulance transport requested for patient.   3:57pm - HF CSW left a voicemail for APS worker D. Jimmye Norman [phone] to update her about Mr. Trefry discharging to the memory care facility in [place], [place].  CSW will sign off for now as social work intervention is no longer needed. Please consult [place] again if new needs arise.     Final next level of care: Memory Care Barriers to Discharge: No Barriers Identified   Patient Goals and CMS Choice Patient states their goals for this hospitalization and ongoing recovery are:: return to [facility].gov Compare Post Acute Care list provided to:: Patient Choice offered to / list presented to : Patient  Discharge Placement PASRR number recieved: 01/10/22            Patient chooses bed at: [facility] Patient to be transferred to facility by: [place] Name of family member notified: Dtr, Harlene Ramus Patient and family notified of of transfer: 01/10/22  Discharge Plan and Services In-house Referral: NA Discharge Planning Services: CM Consult Post Acute Care Choice: Resumption of Svcs/PTA Provider            DME Agency: NA       HH Arranged: RN, PT, Disease Management [place] Agency: [place] Date [place]: 01/02/22 Time [place]:  3491 Representative spoke with at [place]: [place] Determinants of Health ([place]) Interventions     Readmission Risk Interventions Readmission Risk Prevention Plan 01/02/2022 12/13/2021  Transportation Screening Complete Complete  Medication Review Press photographer) Complete Complete  PCP or Specialist appointment within 3-5 days of discharge Complete Complete  HRI or [place] Complete Complete  SW Recovery Care/Counseling Consult Complete Complete  Palliative Care Screening Not Applicable Not [place] Not Applicable Not Applicable  Some recent data might be hidden    Edrei Norgaard, MSW, LCSWA [phone] Heart Failure Social Worker

## 2022-01-10 NOTE — Progress Notes (Signed)
RN attempted to call report 

## 2022-03-16 IMAGING — US US RENAL
1 series · 14 of 25 positions shown · non-contrast
Comparison: 10/15/2018 and prior.

CLINICAL DATA: MOHAMMADRAHIM

EXAM:
RENAL / URINARY TRACT ULTRASOUND COMPLETE

[Series 1: us renal · 14 of 40 slices shown]
[im 1/40]
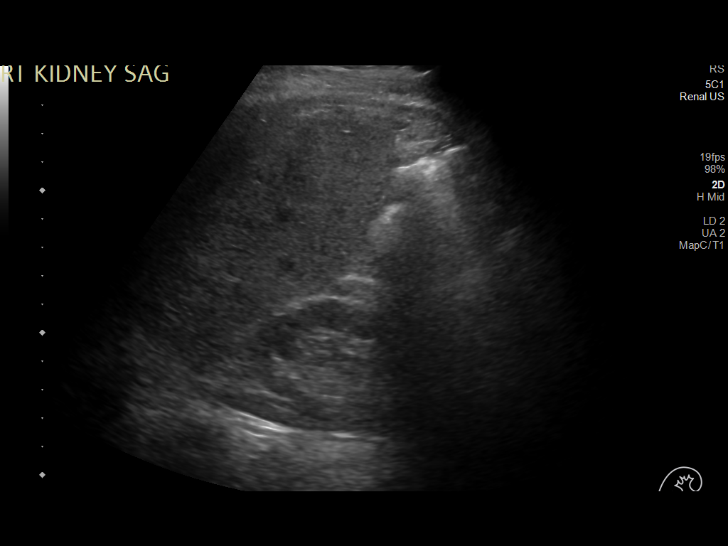
[im 4/40]
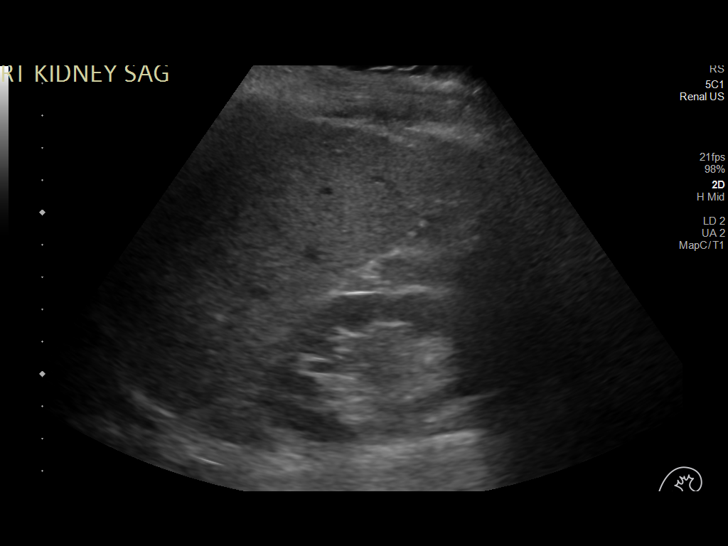
[im 7/40]
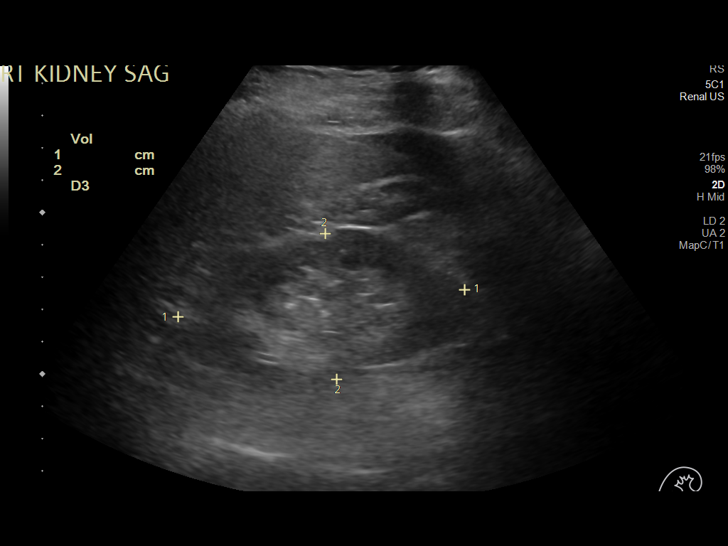
[im 10/40]
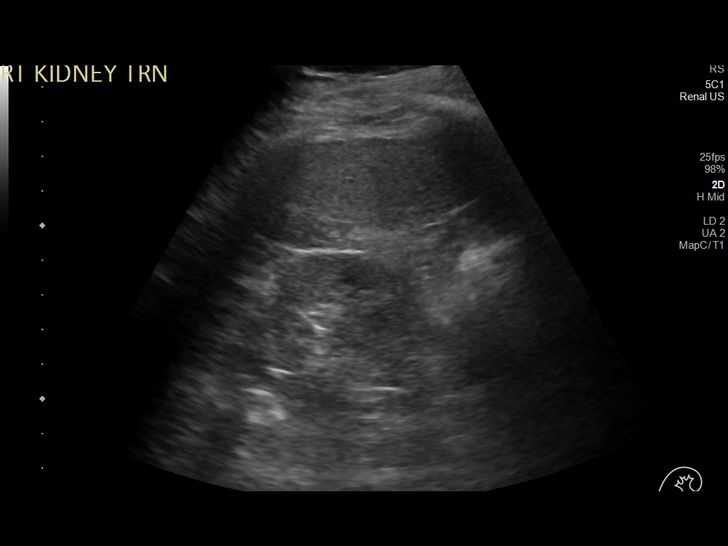
[im 14/40]
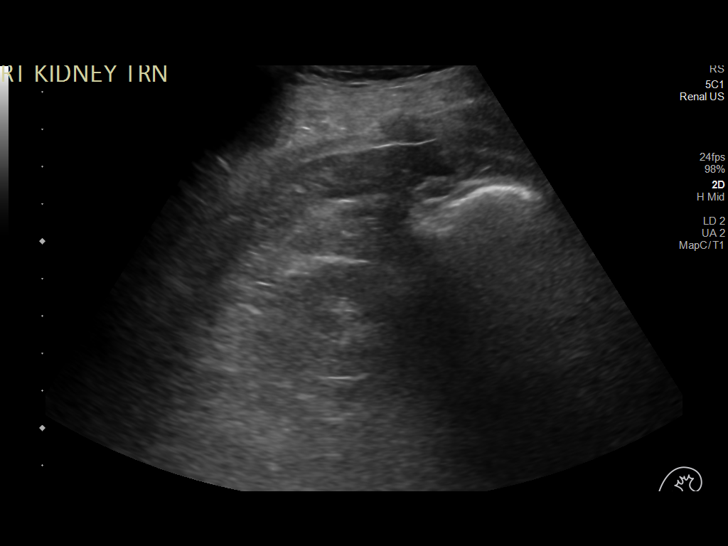
[im 15/40]
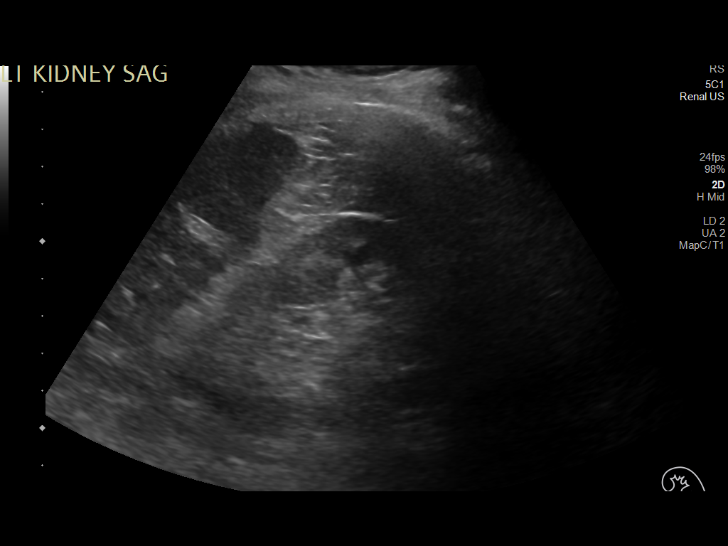
[im 18/40]
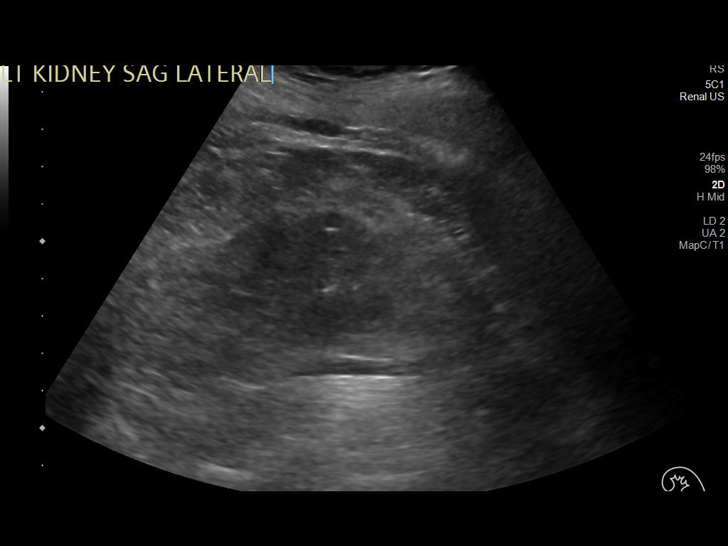
[im 22/40]
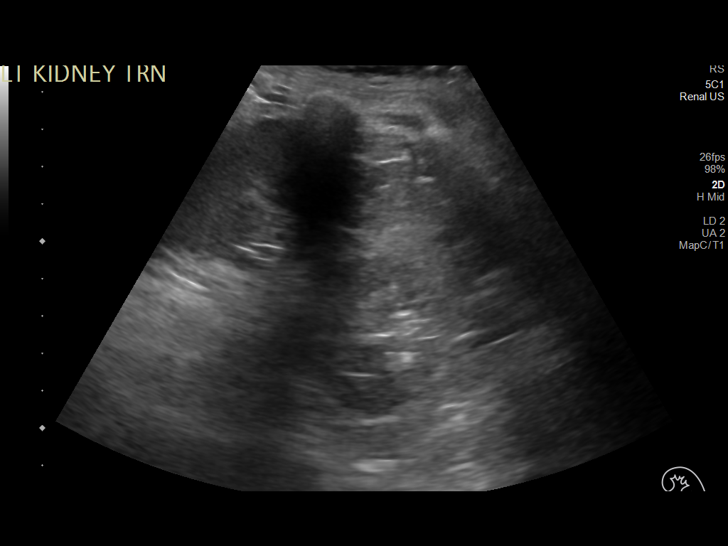
[im 25/40]
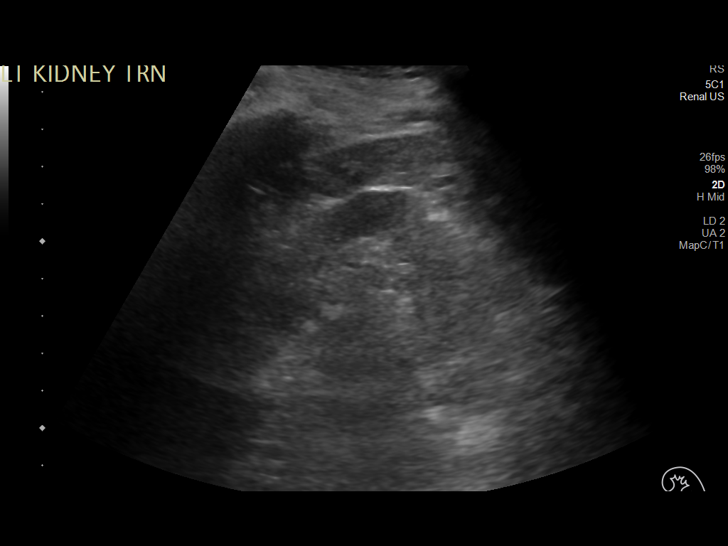
[im 27/40]
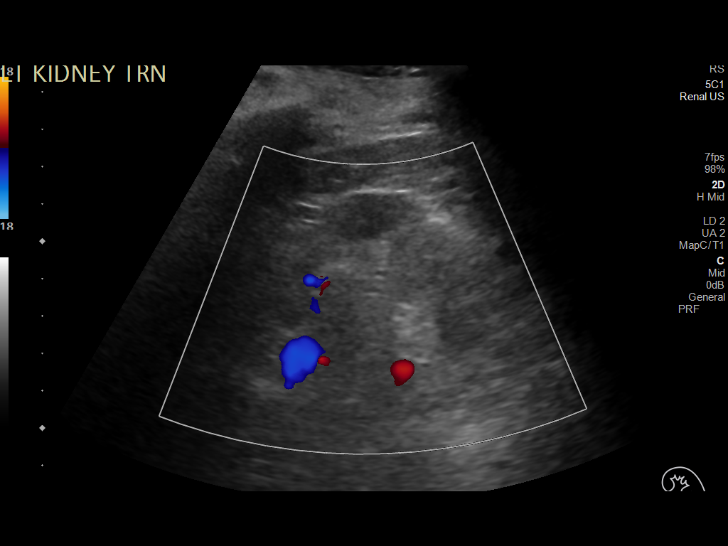
[im 30/40]
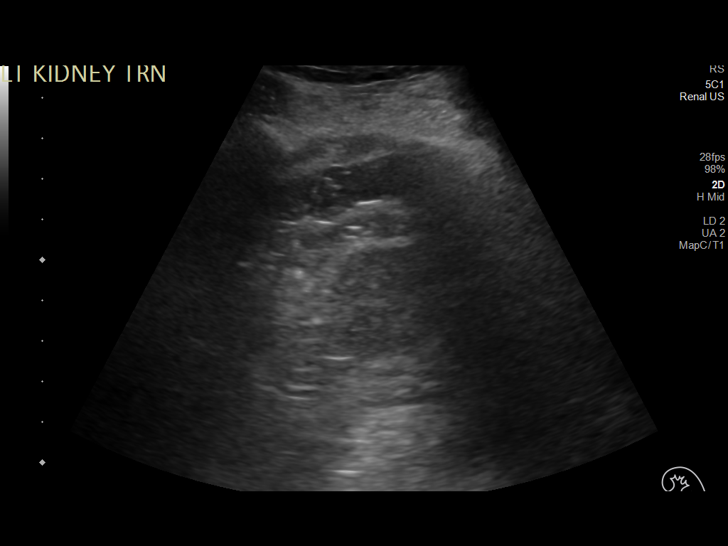
[im 33/40]
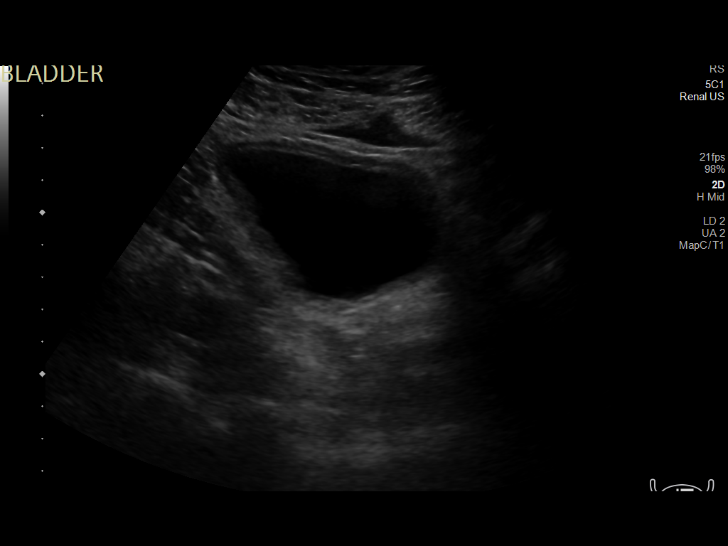
[im 36/40]
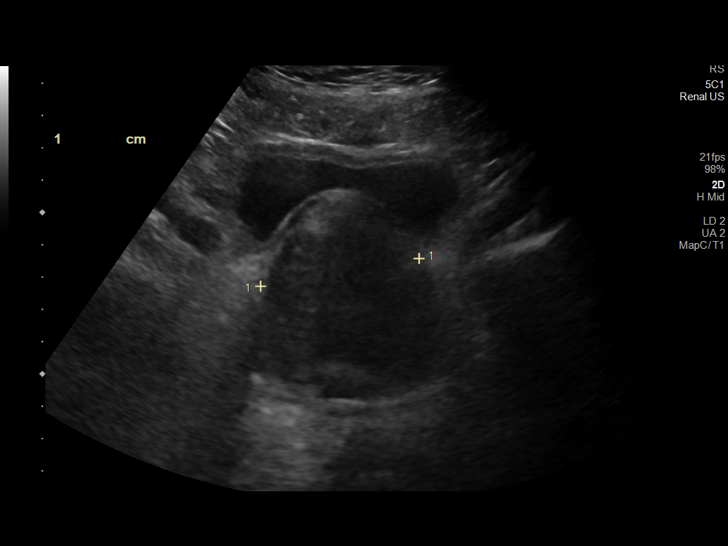
[im 40/40]
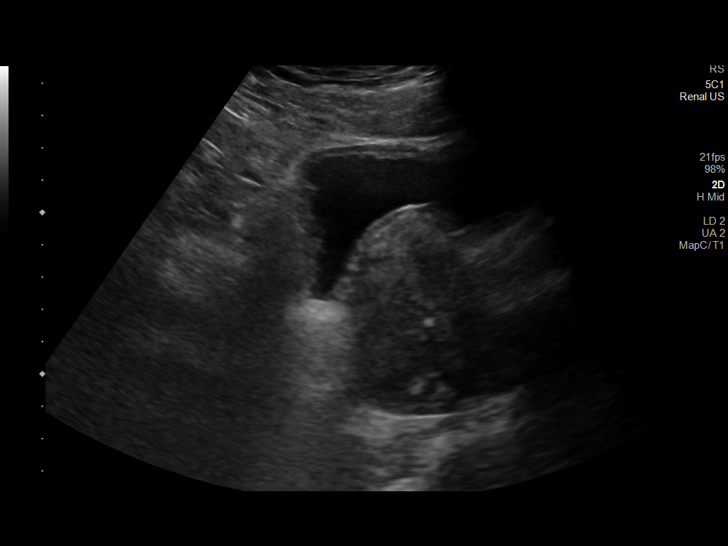

[14 of 25 positions shown; findings below may reference images not displayed]

FINDINGS: Right Kidney:

Renal measurements: 8.9 x 4.5 x 4.9 cm = volume: 102.7 mL. Diffuse
cortical thinning. Echogenicity within normal limits. No mass or
hydronephrosis visualized.

Left Kidney:

Renal measurements: 9.4 x 5.5 x 5.3 cm. = volume: 143.9 mL. Diffuse
cortical thinning. Echogenicity within normal limits. No mass or
hydronephrosis visualized.

Bladder:

Appears normal for degree of bladder distention.

Other:

Enlarged prostate measuring 6.5 x 3.9 x 5.0 cm.
IMPRESSION: Bilateral cortical atrophy. Otherwise unremarkable sonographic
appearance of the kidneys.

Prostatomegaly.

## 2022-03-17 IMAGING — CT CT CHEST W/O CM
2 of 4 series · 15 of 36 positions shown, 18 images · non-contrast
Comparison: None.

CLINICAL DATA: Chest pain, pre CABG

EXAM:
CT CHEST WITHOUT CONTRAST
TECHNIQUE: Multidetector CT imaging of the chest was performed following the
standard protocol without IV contrast.

[Series 3: chest w/o 2mm st · axial · non-contrast · 0.73mm/px · z∈[+1120,+1386]mm · 12 of 155 slices shown, 15 images]
[im 11/155  mediastinal]
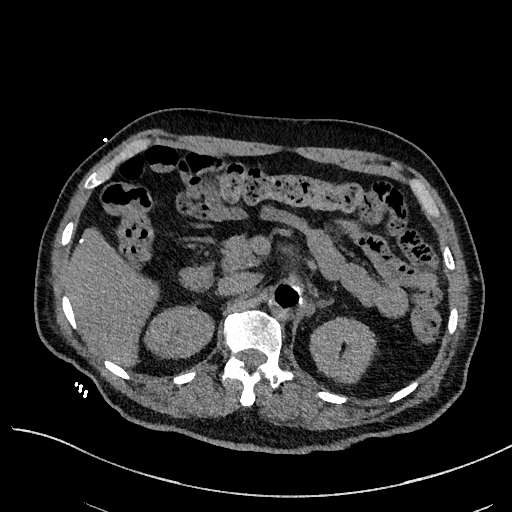
[im 11/155  lung]
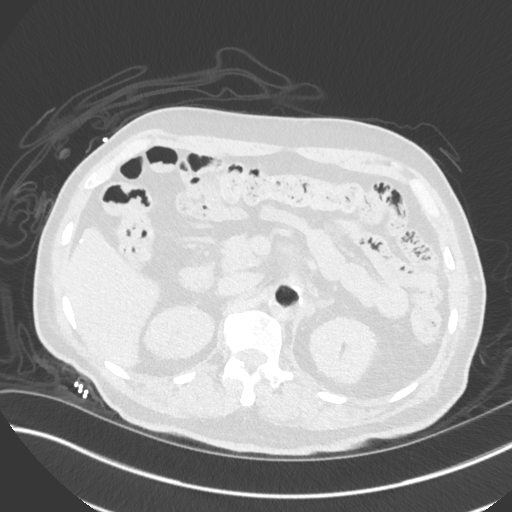
[im 21/155  lung]
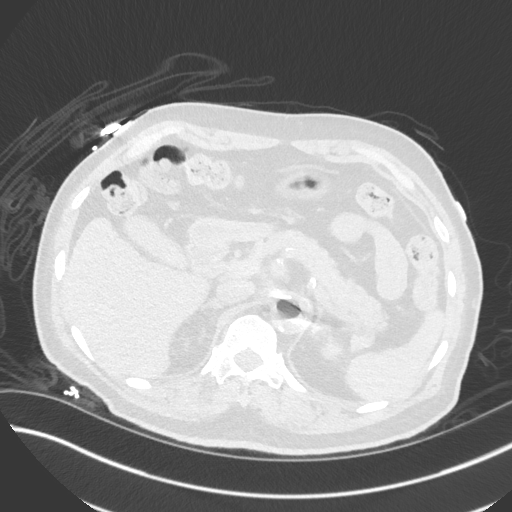
[im 31/155  lung]
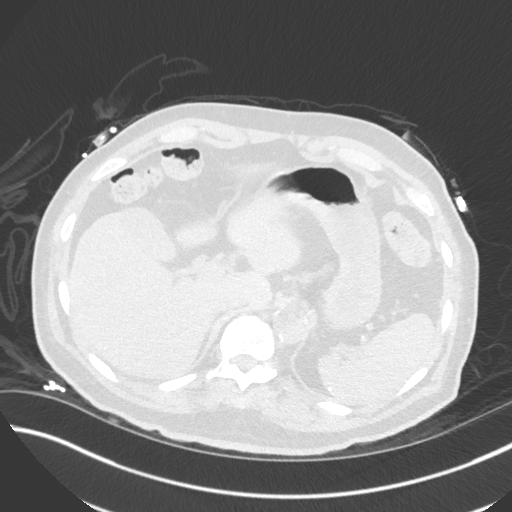
[im 52/155  lung]
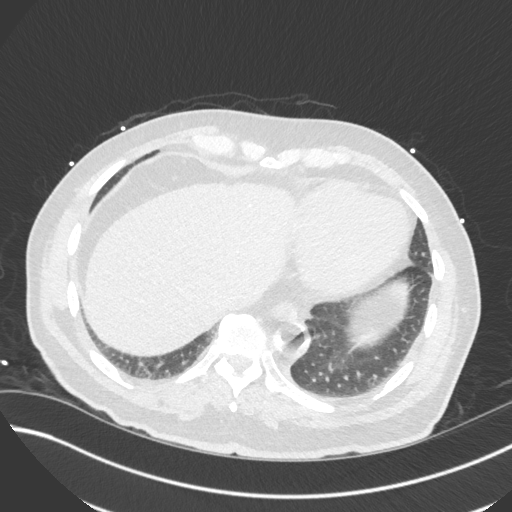
[im 62/155  mediastinal]
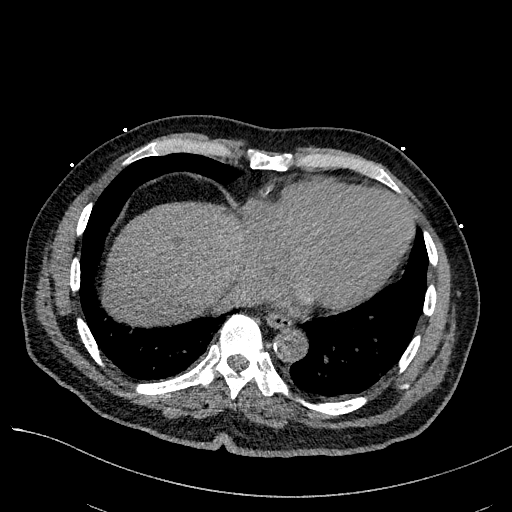
[im 62/155  lung]
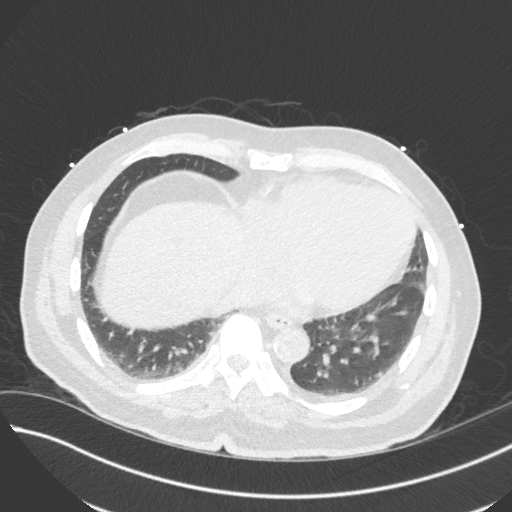
[im 72/155  lung]
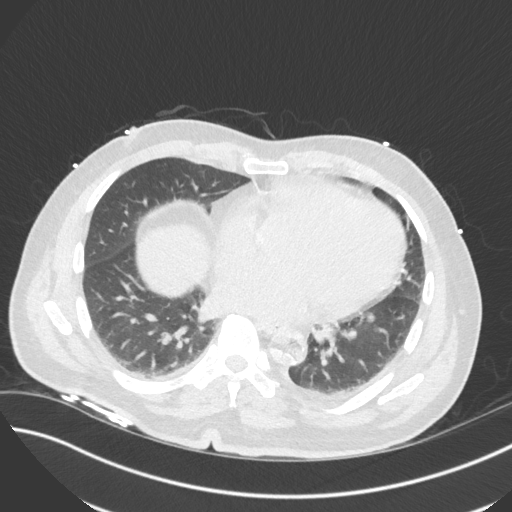
[im 83/155  lung]
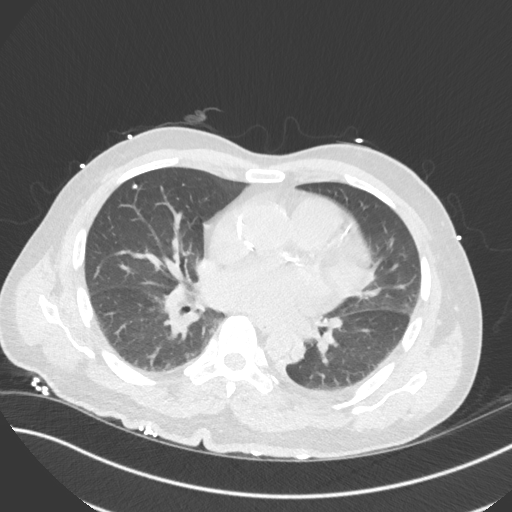
[im 93/155  lung]
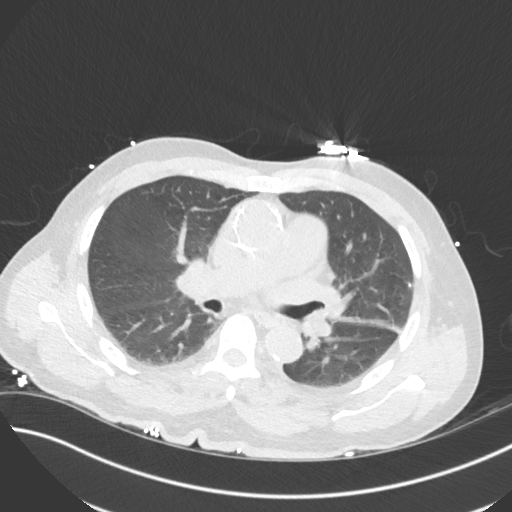
[im 103/155  mediastinal]
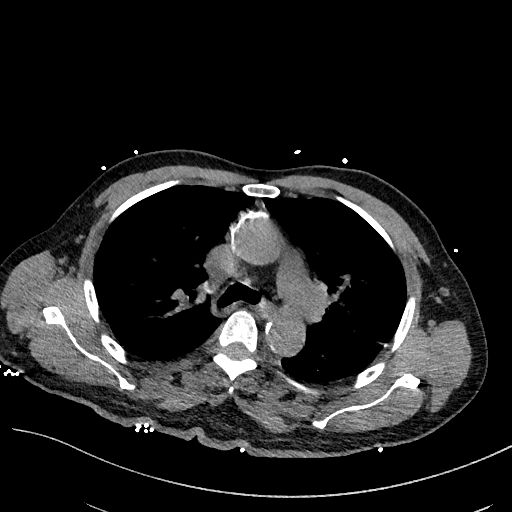
[im 103/155  lung]
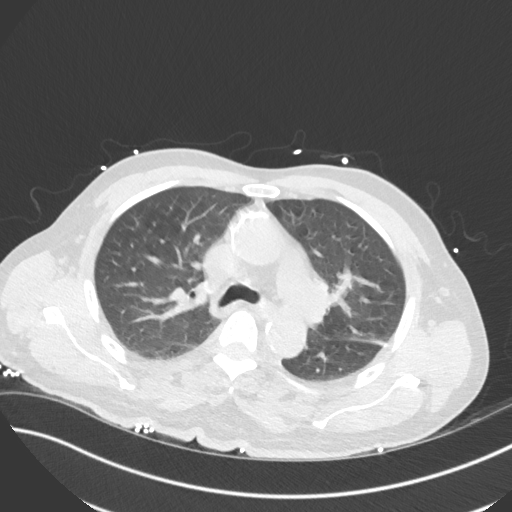
[im 124/155  lung]
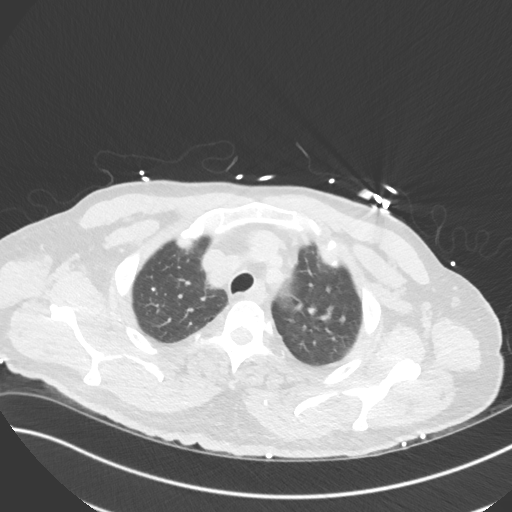
[im 134/155  lung]
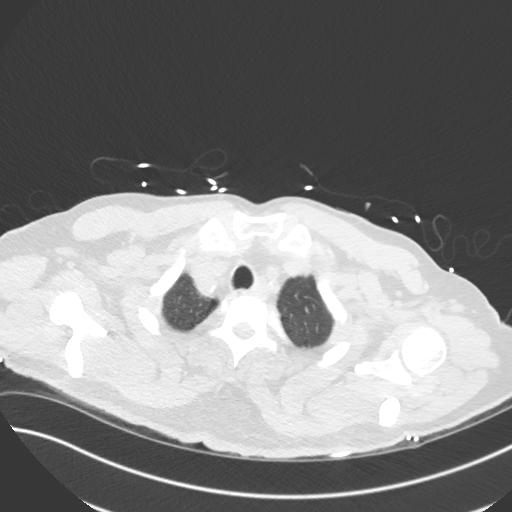
[im 144/155  lung]
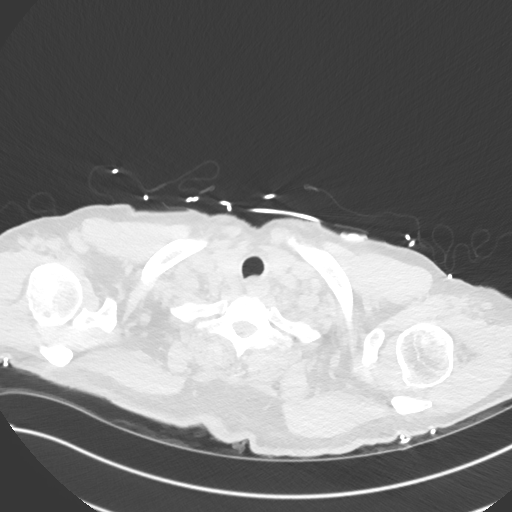

[Series 5: chest w/o 2mm st cor · coronal · non-contrast · 0.60mm/px · 3 of 138 slices shown]
[im 28/138  lung]
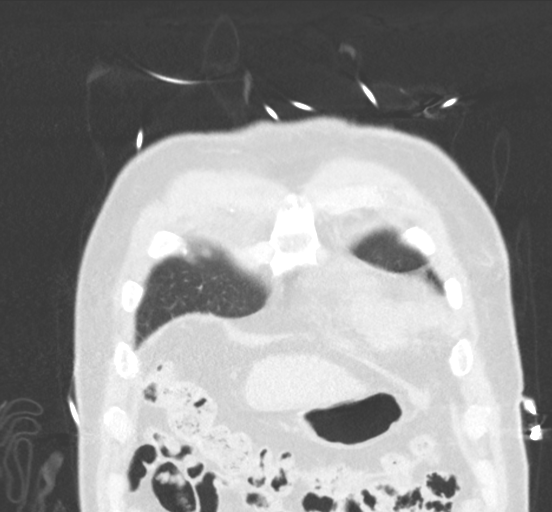
[im 55/138  lung]
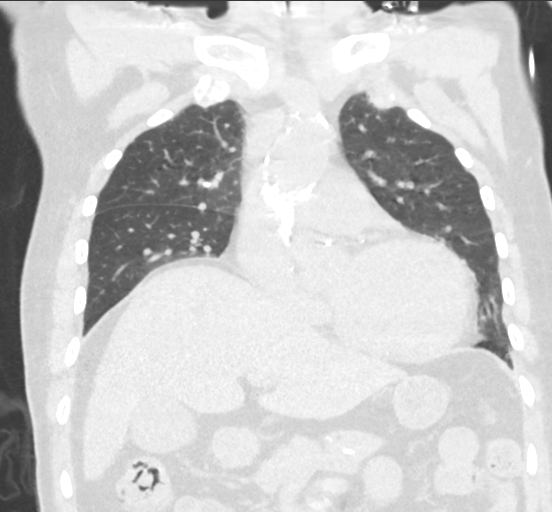
[im 83/138  lung]
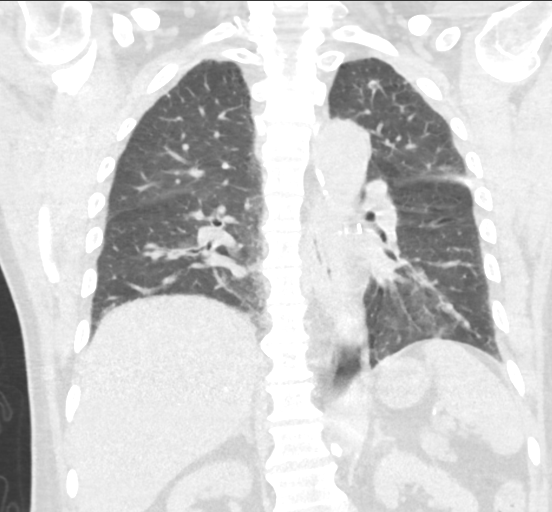

[15 of 36 positions shown; findings below may reference images not displayed]

FINDINGS: Cardiovascular: Heart is borderline in size. Aorta normal caliber.
Diffuse aortic calcifications. Moderate coronary artery and great
vessel calcifications. Presumed intra-aortic balloon pump within the
aorta with the tip in the mid descending thoracic aorta.

Mediastinum/Nodes: No mediastinal, hilar, or axillary adenopathy.
Trachea and esophagus are unremarkable. Thyroid unremarkable.

Lungs/Pleura: 9 mm nodule in the left apex. Calcified granulomas in
the right lung. No effusions.

Upper Abdomen: Calcified granulomas in the liver and spleen.

Musculoskeletal: Chest wall soft tissues are unremarkable. No acute
bony abnormality.
IMPRESSION: Coronary artery disease.

Presumed intra-aortic balloon pump with the tip in the mid
descending thoracic aorta.

Old granulomatous disease.

9 mm nodule in the left apex. Consider one of the following in 3
months for both low-risk and high-risk individuals: (a) repeat chest
CT, (b) follow-up PET-CT, or (c) tissue sampling. This
recommendation follows the consensus statement: Guidelines for
Management of Incidental Pulmonary Nodules Detected on CT Images:

Aortic Atherosclerosis ([id number]-XG5.5).

## 2022-09-03 ENCOUNTER — Emergency Department (HOSPITAL_COMMUNITY): Payer: Medicare Other

## 2022-09-03 ENCOUNTER — Other Ambulatory Visit: Payer: Self-pay

## 2022-09-03 ENCOUNTER — Encounter (HOSPITAL_COMMUNITY): Payer: Self-pay | Admitting: Internal Medicine

## 2022-09-03 ENCOUNTER — Inpatient Hospital Stay (HOSPITAL_COMMUNITY)
Admission: EM | Admit: 2022-09-03 | Discharge: 2022-09-11 | DRG: 637 | Disposition: A | Discharge status: Skilled Nursing Facility | Payer: Medicare Other | Attending: Family Medicine | Admitting: Family Medicine

## 2022-09-03 DIAGNOSIS — E871 Hypo-osmolality and hyponatremia: Secondary | ICD-10-CM | POA: Diagnosis present

## 2022-09-03 DIAGNOSIS — N184 Chronic kidney disease, stage 4 (severe): Secondary | ICD-10-CM | POA: Diagnosis not present

## 2022-09-03 DIAGNOSIS — E860 Dehydration: Secondary | ICD-10-CM | POA: Diagnosis present

## 2022-09-03 DIAGNOSIS — E1100 Type 2 diabetes mellitus with hyperosmolarity without nonketotic hyperglycemic-hyperosmolar coma (NKHHC): Principal | ICD-10-CM | POA: Diagnosis present

## 2022-09-03 DIAGNOSIS — I5022 Chronic systolic (congestive) heart failure: Secondary | ICD-10-CM

## 2022-09-03 DIAGNOSIS — I10 Essential (primary) hypertension: Secondary | ICD-10-CM | POA: Diagnosis not present

## 2022-09-03 DIAGNOSIS — Z7902 Long term (current) use of antithrombotics/antiplatelets: Secondary | ICD-10-CM | POA: Diagnosis not present

## 2022-09-03 DIAGNOSIS — R4182 Altered mental status, unspecified: Secondary | ICD-10-CM | POA: Diagnosis present

## 2022-09-03 DIAGNOSIS — I2510 Atherosclerotic heart disease of native coronary artery without angina pectoris: Secondary | ICD-10-CM | POA: Diagnosis present

## 2022-09-03 DIAGNOSIS — E11649 Type 2 diabetes mellitus with hypoglycemia without coma: Secondary | ICD-10-CM | POA: Diagnosis present

## 2022-09-03 DIAGNOSIS — Z66 Do not resuscitate: Secondary | ICD-10-CM | POA: Diagnosis present

## 2022-09-03 DIAGNOSIS — E876 Hypokalemia: Secondary | ICD-10-CM | POA: Diagnosis present

## 2022-09-03 DIAGNOSIS — G9341 Metabolic encephalopathy: Secondary | ICD-10-CM | POA: Diagnosis present

## 2022-09-03 DIAGNOSIS — K449 Diaphragmatic hernia without obstruction or gangrene: Secondary | ICD-10-CM | POA: Diagnosis present

## 2022-09-03 DIAGNOSIS — Z79899 Other long term (current) drug therapy: Secondary | ICD-10-CM | POA: Diagnosis not present

## 2022-09-03 DIAGNOSIS — N179 Acute kidney failure, unspecified: Secondary | ICD-10-CM | POA: Diagnosis present

## 2022-09-03 DIAGNOSIS — E782 Mixed hyperlipidemia: Secondary | ICD-10-CM | POA: Diagnosis present

## 2022-09-03 DIAGNOSIS — K2981 Duodenitis with bleeding: Secondary | ICD-10-CM | POA: Diagnosis not present

## 2022-09-03 DIAGNOSIS — E1165 Type 2 diabetes mellitus with hyperglycemia: Secondary | ICD-10-CM | POA: Diagnosis not present

## 2022-09-03 DIAGNOSIS — E1122 Type 2 diabetes mellitus with diabetic chronic kidney disease: Secondary | ICD-10-CM | POA: Diagnosis present

## 2022-09-03 DIAGNOSIS — D75839 Thrombocytosis, unspecified: Secondary | ICD-10-CM | POA: Diagnosis not present

## 2022-09-03 DIAGNOSIS — H9190 Unspecified hearing loss, unspecified ear: Secondary | ICD-10-CM | POA: Diagnosis present

## 2022-09-03 DIAGNOSIS — K219 Gastro-esophageal reflux disease without esophagitis: Secondary | ICD-10-CM | POA: Diagnosis present

## 2022-09-03 DIAGNOSIS — K254 Chronic or unspecified gastric ulcer with hemorrhage: Secondary | ICD-10-CM | POA: Diagnosis not present

## 2022-09-03 DIAGNOSIS — Z7982 Long term (current) use of aspirin: Secondary | ICD-10-CM | POA: Diagnosis not present

## 2022-09-03 DIAGNOSIS — R770 Abnormality of albumin: Secondary | ICD-10-CM | POA: Diagnosis not present

## 2022-09-03 DIAGNOSIS — R578 Other shock: Secondary | ICD-10-CM

## 2022-09-03 DIAGNOSIS — I5042 Chronic combined systolic (congestive) and diastolic (congestive) heart failure: Secondary | ICD-10-CM | POA: Diagnosis present

## 2022-09-03 DIAGNOSIS — N1832 Chronic kidney disease, stage 3b: Secondary | ICD-10-CM | POA: Diagnosis present

## 2022-09-03 DIAGNOSIS — Z7984 Long term (current) use of oral hypoglycemic drugs: Secondary | ICD-10-CM

## 2022-09-03 DIAGNOSIS — I272 Pulmonary hypertension, unspecified: Secondary | ICD-10-CM | POA: Diagnosis present

## 2022-09-03 DIAGNOSIS — K625 Hemorrhage of anus and rectum: Secondary | ICD-10-CM | POA: Diagnosis present

## 2022-09-03 DIAGNOSIS — R571 Hypovolemic shock: Secondary | ICD-10-CM | POA: Diagnosis not present

## 2022-09-03 DIAGNOSIS — N400 Enlarged prostate without lower urinary tract symptoms: Secondary | ICD-10-CM | POA: Diagnosis present

## 2022-09-03 DIAGNOSIS — I130 Hypertensive heart and chronic kidney disease with heart failure and stage 1 through stage 4 chronic kidney disease, or unspecified chronic kidney disease: Secondary | ICD-10-CM | POA: Diagnosis present

## 2022-09-03 DIAGNOSIS — K2970 Gastritis, unspecified, without bleeding: Secondary | ICD-10-CM | POA: Diagnosis present

## 2022-09-03 DIAGNOSIS — K922 Gastrointestinal hemorrhage, unspecified: Secondary | ICD-10-CM | POA: Diagnosis not present

## 2022-09-03 DIAGNOSIS — D62 Acute posthemorrhagic anemia: Secondary | ICD-10-CM | POA: Diagnosis not present

## 2022-09-03 DIAGNOSIS — K250 Acute gastric ulcer with hemorrhage: Secondary | ICD-10-CM | POA: Diagnosis not present

## 2022-09-03 DIAGNOSIS — R1310 Dysphagia, unspecified: Secondary | ICD-10-CM | POA: Diagnosis present

## 2022-09-03 DIAGNOSIS — D631 Anemia in chronic kidney disease: Secondary | ICD-10-CM | POA: Diagnosis present

## 2022-09-03 LAB — COMPREHENSIVE METABOLIC PANEL
ALT: 17 U/L (ref 0–44)
AST: 37 U/L (ref 15–41)
Albumin: 3.1 g/dL — ABNORMAL LOW (ref 3.5–5.0)
Alkaline Phosphatase: 98 U/L (ref 38–126)
Anion gap: 14 (ref 5–15)
BUN: 88 mg/dL — ABNORMAL HIGH (ref 8–23)
CO2: 26 mmol/L (ref 22–32)
Calcium: 8.7 mg/dL — ABNORMAL LOW (ref 8.9–10.3)
Chloride: 109 mmol/L (ref 98–111)
Creatinine, Ser: 3.87 mg/dL — ABNORMAL HIGH (ref 0.61–1.24)
GFR, Estimated: 15 mL/min — ABNORMAL LOW (ref >60)
Glucose, Bld: 977 mg/dL (ref 70–99)
Potassium: 5.1 mmol/L (ref 3.5–5.1)
Sodium: 149 mmol/L — ABNORMAL HIGH (ref 135–145)
Total Bilirubin: 0.8 mg/dL (ref 0.3–1.2)
Total Protein: 8.1 g/dL (ref 6.5–8.1)

## 2022-09-03 LAB — PROTIME-INR
INR: 1.4 — ABNORMAL HIGH (ref 0.8–1.2)
Prothrombin Time: 16.9 seconds — ABNORMAL HIGH (ref 11.4–15.2)

## 2022-09-03 LAB — GLUCOSE, CAPILLARY
Glucose-Capillary: >600 mg/dL (ref 70–99)
Glucose-Capillary: 583 mg/dL (ref 70–99)
Glucose-Capillary: >600 mg/dL (ref 70–99)
Glucose-Capillary: >600 mg/dL (ref 70–99)
Glucose-Capillary: 516 mg/dL (ref 70–99)
Glucose-Capillary: 273 mg/dL — ABNORMAL HIGH (ref 70–99)
Glucose-Capillary: 324 mg/dL — ABNORMAL HIGH (ref 70–99)
Glucose-Capillary: 193 mg/dL — ABNORMAL HIGH (ref 70–99)
Glucose-Capillary: 164 mg/dL — ABNORMAL HIGH (ref 70–99)
Glucose-Capillary: 188 mg/dL — ABNORMAL HIGH (ref 70–99)

## 2022-09-03 LAB — URINALYSIS, ROUTINE W REFLEX MICROSCOPIC
Bilirubin Urine: NEGATIVE
Glucose, UA: >=500 mg/dL — AB
Hgb urine dipstick: NEGATIVE
Ketones, ur: NEGATIVE mg/dL
Nitrite: NEGATIVE
Protein, ur: NEGATIVE mg/dL
Specific Gravity, Urine: 1.017 (ref 1.005–1.030)
pH: 5 (ref 5.0–8.0)

## 2022-09-03 LAB — BASIC METABOLIC PANEL
Anion gap: 18 — ABNORMAL HIGH (ref 5–15)
Anion gap: 16 — ABNORMAL HIGH (ref 5–15)
BUN: 105 mg/dL — ABNORMAL HIGH (ref 8–23)
BUN: 102 mg/dL — ABNORMAL HIGH (ref 8–23)
CO2: 24 mmol/L (ref 22–32)
CO2: 24 mmol/L (ref 22–32)
Calcium: 9 mg/dL (ref 8.9–10.3)
Calcium: 9.3 mg/dL (ref 8.9–10.3)
Chloride: 114 mmol/L — ABNORMAL HIGH (ref 98–111)
Chloride: 119 mmol/L — ABNORMAL HIGH (ref 98–111)
Creatinine, Ser: 3.87 mg/dL — ABNORMAL HIGH (ref 0.61–1.24)
Creatinine, Ser: 3.57 mg/dL — ABNORMAL HIGH (ref 0.61–1.24)
GFR, Estimated: 15 mL/min — ABNORMAL LOW (ref >60)
GFR, Estimated: 16 mL/min — ABNORMAL LOW (ref >60)
Glucose, Bld: 521 mg/dL (ref 70–99)
Glucose, Bld: 193 mg/dL — ABNORMAL HIGH (ref 70–99)
Potassium: 3.7 mmol/L (ref 3.5–5.1)
Potassium: 3.9 mmol/L (ref 3.5–5.1)
Sodium: 156 mmol/L — ABNORMAL HIGH (ref 135–145)
Sodium: 159 mmol/L — ABNORMAL HIGH (ref 135–145)

## 2022-09-03 LAB — CBC WITH DIFFERENTIAL/PLATELET
Abs Immature Granulocytes: 0.09 10*3/uL — ABNORMAL HIGH (ref 0.00–0.07)
Basophils Absolute: 0.1 10*3/uL (ref 0.0–0.1)
Basophils Relative: 1 %
Eosinophils Absolute: 0 10*3/uL (ref 0.0–0.5)
Eosinophils Relative: 0 %
HCT: 42.7 % (ref 39.0–52.0)
Hemoglobin: 12.9 g/dL — ABNORMAL LOW (ref 13.0–17.0)
Immature Granulocytes: 1 %
Lymphocytes Relative: 3 %
Lymphs Abs: 0.5 10*3/uL — ABNORMAL LOW (ref 0.7–4.0)
MCH: 26.7 pg (ref 26.0–34.0)
MCHC: 30.2 g/dL (ref 30.0–36.0)
MCV: 88.4 fL (ref 80.0–100.0)
Monocytes Absolute: 0.9 10*3/uL (ref 0.1–1.0)
Monocytes Relative: 6 %
Neutro Abs: 13.2 10*3/uL — ABNORMAL HIGH (ref 1.7–7.7)
Neutrophils Relative %: 89 %
Platelets: 572 10*3/uL — ABNORMAL HIGH (ref 150–400)
RBC: 4.83 MIL/uL (ref 4.22–5.81)
RDW: 15.3 % (ref 11.5–15.5)
WBC: 14.7 10*3/uL — ABNORMAL HIGH (ref 4.0–10.5)
nRBC: 0 % (ref 0.0–0.2)

## 2022-09-03 LAB — BETA-HYDROXYBUTYRIC ACID: Beta-Hydroxybutyric Acid: 1.15 mmol/L — ABNORMAL HIGH (ref 0.05–0.27)

## 2022-09-03 LAB — CBG MONITORING, ED
Glucose-Capillary: >600 mg/dL (ref 70–99)
Glucose-Capillary: >600 mg/dL (ref 70–99)
Glucose-Capillary: >600 mg/dL (ref 70–99)

## 2022-09-03 LAB — LACTIC ACID, PLASMA
Lactic Acid, Venous: 3 mmol/L (ref 0.5–1.9)
Lactic Acid, Venous: 3 mmol/L (ref 0.5–1.9)

## 2022-09-03 LAB — MRSA NEXT GEN BY PCR, NASAL: MRSA by PCR Next Gen: NOT DETECTED

## 2022-09-03 MED ORDER — DEXTROSE 50 % IV SOLN: 0.0000 mL | INTRAVENOUS | Status: DC | PRN

## 2022-09-03 MED ORDER — LACTATED RINGERS IV BOLUS
1000.0000 mL | Freq: Once | INTRAVENOUS | Status: AC
Start: 2022-09-03 — End: 2022-09-03
  Administered 2022-09-03: 1000 mL via INTRAVENOUS

## 2022-09-03 MED ORDER — LACTATED RINGERS IV BOLUS: 1000.0000 mL | INTRAVENOUS | Status: AC

## 2022-09-03 MED ORDER — ISOSORB DINITRATE-HYDRALAZINE 20-37.5 MG PO TABS: 1.0000 | ORAL_TABLET | Freq: Two times a day (BID) | ORAL | Status: DC

## 2022-09-03 MED ORDER — CLOPIDOGREL BISULFATE 75 MG PO TABS
75.0000 mg | ORAL_TABLET | Freq: Every day | ORAL | Status: DC
  Administered 2022-09-04: 75 mg via ORAL
  Filled 2022-09-03: qty 1

## 2022-09-03 MED ORDER — INSULIN REGULAR(HUMAN) IN NACL 100-0.9 UT/100ML-% IV SOLN
INTRAVENOUS | Status: DC
  Filled 2022-09-03: qty 100

## 2022-09-03 MED ORDER — ATORVASTATIN CALCIUM 40 MG PO TABS
80.0000 mg | ORAL_TABLET | Freq: Every day | ORAL | Status: DC
  Administered 2022-09-07 – 2022-09-11 (×4): 80 mg via ORAL
  Filled 2022-09-03 (×6): qty 2

## 2022-09-03 MED ORDER — LACTATED RINGERS IV SOLN: INTRAVENOUS | Status: DC

## 2022-09-03 MED ORDER — TAMSULOSIN HCL 0.4 MG PO CAPS
0.8000 mg | ORAL_CAPSULE | Freq: Every day | ORAL | Status: DC
  Filled 2022-09-03: qty 2

## 2022-09-03 MED ORDER — CARVEDILOL 3.125 MG PO TABS: 6.2500 mg | ORAL_TABLET | Freq: Two times a day (BID) | ORAL | Status: DC

## 2022-09-03 MED ORDER — INSULIN REGULAR(HUMAN) IN NACL 100-0.9 UT/100ML-% IV SOLN
INTRAVENOUS | Status: DC
  Administered 2022-09-03: 7 [IU]/h via INTRAVENOUS
  Administered 2022-09-04: 0.5 [IU]/h via INTRAVENOUS

## 2022-09-03 MED ORDER — HEPARIN SODIUM (PORCINE) 5000 UNIT/ML IJ SOLN
5000.0000 [IU] | Freq: Three times a day (TID) | INTRAMUSCULAR | Status: DC
  Administered 2022-09-03 – 2022-09-04 (×4): 5000 [IU] via SUBCUTANEOUS
  Filled 2022-09-03 (×3): qty 1

## 2022-09-03 MED ORDER — PANTOPRAZOLE SODIUM 40 MG PO TBEC
40.0000 mg | DELAYED_RELEASE_TABLET | Freq: Two times a day (BID) | ORAL | Status: DC
  Filled 2022-09-03: qty 1

## 2022-09-03 MED ORDER — FINASTERIDE 5 MG PO TABS
5.0000 mg | ORAL_TABLET | Freq: Every day | ORAL | Status: DC
  Filled 2022-09-03: qty 1

## 2022-09-03 MED ORDER — DEXTROSE IN LACTATED RINGERS 5 % IV SOLN: INTRAVENOUS | Status: DC

## 2022-09-03 MED ORDER — HALOPERIDOL LACTATE 5 MG/ML IJ SOLN
1.0000 mg | Freq: Three times a day (TID) | INTRAMUSCULAR | Status: DC | PRN
  Administered 2022-09-03 – 2022-09-10 (×7): 1 mg via INTRAVENOUS
  Filled 2022-09-03 (×8): qty 1

## 2022-09-03 MED ORDER — ASPIRIN 81 MG PO TBEC
81.0000 mg | DELAYED_RELEASE_TABLET | Freq: Every day | ORAL | Status: DC
  Filled 2022-09-03: qty 1

## 2022-09-03 NOTE — Assessment & Plan Note (Addendum)
-  Blood pressure stable overall -Continue current antihypertensive agents; follow-up vital signs.

## 2022-09-03 NOTE — ED Provider Notes (Signed)
[facility] EMERGENCY DEPARTMENT Provider Note   CSN: [id number] Arrival date & time: 09/03/22  1212     History  Chief Complaint  Patient presents with   Altered Mental Status    Chozen Rogerson is a 81 y.o. male with CKD stage III, HTN, HLD, bradycardia, chronic systolic heart failure (EF 30-35%), BPH, hyperglycemia presents from a facility with AMS.  Patient is altered, and [place] speaking.  Attempted to use audio [place] interpreter, but patient does not respond.  Unable to obtain history from patient. History obtained from chart review, minimal history obtained from collateral.  Called Barbera Setters, patient's son, who is located in [place] and hasn't seen the patient recently. States the facility told him the patient wasn't eating and they were going to the ED, but doesn't know more than that.  Called patient's facility, his nurse states that he wasn't taking his AM this morning. T 96.1 F. Hasn't been eating well lately. At baseline patient will take his medications with no issue, speaks his native language and speaks some english. Was combative with technician this AM.   Patient was admitted from 1/9-1/20/23 for HF exacerbation, has no visits in our chart since then.   POC Glucose on arrival >600 mg/dL.    Altered Mental Status      Home Medications Prior to Admission medications   Medication Sig Start Date End Date Taking? Authorizing Provider  acetaminophen (TYLENOL) 325 MG tablet Take 2 tablets (650 mg total) by mouth every 4 (four) hours as needed for headache or mild pain. 07/04/21   Isaiah Serge, NP  aspirin 81 MG EC tablet Take 1 tablet (81 mg total) by mouth daily. Swallow whole. 11/28/21   Thurnell Lose, MD  atorvastatin (LIPITOR) 80 MG tablet Take 1 tablet (80 mg total) by mouth daily. 02/06/21   Hosie Poisson, MD  carvedilol (COREG) 6.25 MG tablet Take 1 tablet (6.25 mg total) by mouth 2 (two) times daily with a meal. 12/14/21 01/13/22  Darliss Cheney, MD  clopidogrel (PLAVIX) 75 MG tablet Take 1 tablet (75 mg total) by mouth daily. 02/06/21   Hosie Poisson, MD  ferrous sulfate 325 (65 FE) MG tablet Take 1 tablet (325 mg total) by mouth 2 (two) times daily with a meal. 11/28/21   Thurnell Lose, MD  finasteride (PROSCAR) 5 MG tablet Take 1 tablet (5 mg total) by mouth daily. 11/21/21   Lavina Hamman, MD  isosorbide-hydrALAZINE (BIDIL) 20-37.5 MG tablet Take 1 tablet by mouth 2 (two) times daily. 01/09/22   Domenic Polite, MD  JARDIANCE 10 MG TABS tablet Take 10 mg by mouth daily. 11/10/21   [provider]  metFORMIN (GLUCOPHAGE) 500 MG tablet Take 500 mg by mouth in the morning and at bedtime. 06/10/21   [provider]  nitroGLYCERIN (NITROSTAT) 0.4 MG SL tablet Place 1 tablet (0.4 mg total) under the tongue every 5 (five) minutes x 3 doses as needed for chest pain. 02/05/21   Hosie Poisson, MD  pantoprazole (PROTONIX) 40 MG tablet Take 1 tablet (40 mg total) by mouth 2 (two) times daily before a meal. 11/28/21   Thurnell Lose, MD  polyethylene glycol (MIRALAX / GLYCOLAX) 17 g packet Take 17 g by mouth daily as needed for mild constipation. 01/09/22   Domenic Polite, MD  tamsulosin (FLOMAX) 0.4 MG CAPS capsule Take 2 capsules (0.8 mg total) by mouth daily. 11/28/21   Thurnell Lose, MD  torsemide 40 MG TABS Take 40 mg  by mouth daily. 01/10/22   Domenic Polite, MD  vitamin B-12 (CYANOCOBALAMIN) 500 MCG tablet Take 1 tablet (500 mcg total) by mouth daily. 11/21/21   Lavina Hamman, MD      Allergies    Patient has no known allergies.    Review of Systems   Review of Systems  Unable to perform ROS: Mental status change    Physical Exam Updated Vital Signs BP 121/81   Pulse 82   Resp 18   SpO2 100%  Physical Exam General: Restless appearing elderly male, lying in bed.  HEENT: PERRLA, Sclera anicteric, dry mucous membranes, trachea midline. Cardiology: Bradycardic regular rate, no  murmurs/rubs/gallops. Resp: Normal respiratory rate and effort. CTAB, no wheezes, rhonchi, crackles.  Abd: Soft, non-tender, non-distended. No rebound tenderness or guarding.  GU: Deferred. MSK: No peripheral edema or signs of trauma. Extremities without deformity or TTP. No cyanosis or clubbing. Skin: warm, dry. No rashes or lesions. Back: No CVA tenderness Neuro: GCS 10 ([id number]). Does not respond to [place] or [place] language. CNs II-XII grossly intact. MAEs. Withdraws to pain in all extremities. Psych: UTA.  ED Results / Procedures / Treatments   Labs (all labs ordered are listed, but only abnormal results are displayed) Labs Reviewed  COMPREHENSIVE METABOLIC PANEL - Abnormal; Notable for the following components:      Result Value   Sodium 149 (*)    Glucose, Bld 977 (*)    BUN 88 (*)    Creatinine, Ser 3.87 (*)    Calcium 8.7 (*)    Albumin 3.1 (*)    GFR, Estimated 15 (*)    All other components within normal limits  LACTIC ACID, PLASMA - Abnormal; Notable for the following components:   Lactic Acid, Venous 3.0 (*)    All other components within normal limits  CBC WITH DIFFERENTIAL/PLATELET - Abnormal; Notable for the following components:   WBC 14.7 (*)    Hemoglobin 12.9 (*)    Platelets 572 (*)    Neutro Abs 13.2 (*)    Lymphs Abs 0.5 (*)    Abs Immature Granulocytes 0.09 (*)    All other components within normal limits  PROTIME-INR - Abnormal; Notable for the following components:   Prothrombin Time 16.9 (*)    INR 1.4 (*)    All other components within normal limits  BETA-HYDROXYBUTYRIC ACID - Abnormal; Notable for the following components:   Beta-Hydroxybutyric Acid 1.15 (*)    All other components within normal limits  CBG MONITORING, ED - Abnormal; Notable for the following components:   Glucose-Capillary >600 (*)    All other components within normal limits  URINALYSIS, ROUTINE W REFLEX MICROSCOPIC  LACTIC ACID, PLASMA  OSMOLALITY  CBC  BASIC METABOLIC  PANEL  BASIC METABOLIC PANEL  BASIC METABOLIC PANEL  BASIC METABOLIC PANEL  OSMOLALITY  HEMOGLOBIN A1C  CBG MONITORING, ED    EKG EKG Interpretation  Date/Time:  Wednesday September 03 2022 14:47:06 EDT Ventricular Rate:  62 PR Interval:    QRS Duration: 163 QT Interval:  449 QTC Calculation: 456 R Axis:   41 Text Interpretation: Significant artifact IVCD, consider atypical RBBB LVH with secondary repolarization abnormality Inferior infarct, age indeterminate Artifact in lead(s) I II aVR aVL V1 V2 Confirmed by Cindee Lame [id number]) on 09/03/2022 3:38:10 PM  Radiology CT Head Wo Contrast  Result Date: 09/03/2022 CLINICAL DATA:  Altered mental status EXAM: CT HEAD WITHOUT CONTRAST TECHNIQUE: Contiguous axial images were obtained from the base of the skull through  the vertex without intravenous contrast. RADIATION DOSE REDUCTION: This exam was performed according to the departmental dose-optimization program which includes automated exposure control, adjustment of the mA and/or kV according to patient size and/or use of iterative reconstruction technique. COMPARISON:  None Available. FINDINGS: Brain: No acute intracranial findings are seen. There are no signs of bleeding within the cranium. Minimal calcifications are seen in basal ganglia. Cortical sulci are prominent. There is small area of encephalomalacia in the right frontal cortex. There is decreased density in periventricular and subcortical white matter. Vascular: Scattered arterial calcifications are seen. Skull: Unremarkable. Sinuses/Orbits: Unremarkable. Other: None. IMPRESSION: No acute intracranial findings are seen in noncontrast CT brain. Atrophy. 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 Electronically Signed   By: Elmer Picker M.D.   On: 09/03/2022 15:24   DG Chest Port 1 View  Result Date: 09/03/2022 CLINICAL DATA:  Altered mental status EXAM: PORTABLE CHEST 1 VIEW COMPARISON:  Radiograph  12/30/2021 FINDINGS: Unchanged cardiomediastinal silhouette. There is no focal airspace consolidation. There is no pleural effusion. No pneumothorax. There is no acute osseous abnormality. Unchanged mildly elevated right hemidiaphragm. IMPRESSION: No evidence of acute cardiopulmonary disease. Unchanged mildly elevated right hemidiaphragm Electronically Signed   By: Maurine Simmering M.D.   On: 09/03/2022 13:50    Procedures .Critical Care  Performed by: Audley Hose, MD Authorized by: Audley Hose, MD   Critical care provider statement:    Critical care time (minutes):  45   Critical care was necessary to treat or prevent imminent or life-threatening deterioration of the following conditions:  Metabolic crisis, endocrine crisis, dehydration, CNS failure or compromise and shock   Critical care was time spent personally by me on the following activities:  Development of treatment plan with patient or surrogate, discussions with consultants, evaluation of patient's response to treatment, examination of patient, ordering and review of laboratory studies, ordering and review of radiographic studies, ordering and performing treatments and interventions, pulse oximetry, re-evaluation of patient's condition, review of old charts and obtaining history from patient or surrogate   Care discussed with: admitting provider       Medications Ordered in ED Medications  lactated ringers infusion (has no administration in time range)  dextrose 5 % in lactated ringers infusion (has no administration in time range)  dextrose 50 % solution 0-50 mL (has no administration in time range)  heparin injection 5,000 Units (has no administration in time range)  insulin regular, human (MYXREDLIN) 100 units/ 100 mL infusion (has no administration in time range)  lactated ringers infusion (has no administration in time range)  dextrose 5 % in lactated ringers infusion (has no administration in time range)  dextrose 50 %  solution 0-50 mL (has no administration in time range)  lactated ringers bolus 1,000 mL (has no administration in time range)  lactated ringers bolus 1,000 mL (1,000 mLs Intravenous New Bag/Given 09/03/22 1345)    ED Course/ Medical Decision Making/ A&P                          Medical Decision Making Amount and/or Complexity of Data Reviewed Labs: ordered. Radiology: ordered.  Risk Prescription drug management.   Patient w/ AMS and hyperglycemia as chief complaint. initially with soft pressures initially and given 1L LR. Pulse low 50s to 80s. Initialy POC glucose read >600 mg/dL.   Ddx of acute altered mental status or encephalopathy considered but not limited to: -Intracranial abnormalities such as  ICH, hydrocephalus, head trauma - no evidence of head trauma but history is completely unknown so will obtain CTH -Infection such as UTI, PNA, or meningitis - afebrile, no tachycardia to indicate sepsis but will continue to evaluate -Toxic ingestion such as opioid overdose, anticholinergic toxicity -Electrolyte abnormalities or hyper/hypoglycemia -Hepatic encephalopathy or uremia -ACS or arrhythmia -Endocrine abnormality  Consider DKA v HHS at top of differential, also hypovolemic shock, possible septic shock though afebrile but will evaluate for sources of infection, less likely cardiogenic or obstructive shock. Patient's labs demonstrate Na 149, glucose 977, BUN 88, Cr 3.87, AG 14, Lactate 3.0, WBC 14.7, beta hydroxybutyrate 1.15. Labs indicate significant dehydration, AKI, uremia. No significant metabolic acidosis. EKG does not demonstrate ischemic changes and is similar to prior despite significant artifact d/t patient restlessness. 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   I have personally reviewed and interpreted all labs and imaging.   Clinical Course as of 09/03/22 1539  Wed Sep 03, 2022  1404 IV insulin ordered [HN]  1450 Attempted  to communicate with patient via [place] audio language interpreter, and patient is not responsive to [place] language. [HN]    Clinical Course User Index [HN] Audley Hose, MD    Patient's BP improved after 1L LR. Started insulin gtt. Patient is consulted to and admitted to hospitalist for further management.  Dispo: Admit         Final Clinical Impression(s) / ED Diagnoses Final diagnoses:  None    Rx / DC Orders ED Discharge Orders     None        This note was created using dictation software, which may contain spelling or grammatical errors.    Audley Hose, MD 09/03/22 1540

## 2022-09-03 NOTE — ED Triage Notes (Signed)
Patient brought in by [facility] from [facility].  Staff stated that the patient was not acting right and that his finger tips are blue x1 week.  Staff told EMS that the patient is not diabetic and then checked his paperwork and stated that he is and they had not been checking his CBG.  EMS states that the patient is uncooperative..  CBG reads high.

## 2022-09-03 NOTE — Assessment & Plan Note (Addendum)
-  Stable and currently compensated -Continue to follow daily weights and strict I's and O's -Continue judicious fluid resuscitation as patient presented with hyperosmolar hyperglycemic crisis and hyponatremia. -Continue holding diuretics initially given worsening renal function.

## 2022-09-03 NOTE — ED Notes (Signed)
Contacted interpreter that speaks [place].  Patient does not communicate with interpreter.  Provider tried the same with no success.

## 2022-09-03 NOTE — H&P (Signed)
History and Physical    Patient: Randy [name] WUJ:[id number] DOB: 1941/03/19 DOA: 09/03/2022 DOS: the patient was seen and examined on 09/03/2022 PCP: Jilda Panda, MD  Patient coming from: SNF  Chief Complaint:  Chief Complaint  Patient presents with   Altered Mental Status   HPI: Randy [name] is a 81 y.o. male with medical history significant of chronic systolic heart failure, hypertension, hyperlipidemia, coronary artery disease, GERD and BPH; who was brought to the hospital secondary to change in mentation and elevated blood sugar. Patient mentation not allowing for him to participate in interview; currently agitated confused and not following commands. CT head demonstrated no acute intracranial abnormalities; blood work suggesting the presence of hyperosmolar hyperglycemic crisis with blood sugar in the 970 range.  Normal bicarb and worsening renal function. No acute signs of infection appreciated.  IV fluids, insulin therapy initiated.  TRH contacted to place patient in the hospital for further evaluation and management.  Review of Systems: Unable to review all systems due to lack of cooperation from patient. Past Medical History:  Diagnosis Date   Acute systolic CHF (congestive heart failure) ([place]) 11/16/2021   CAD, 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   Hearing loss    HTN (hypertension)    Past Surgical History:  Procedure Laterality Date   ESOPHAGOGASTRODUODENOSCOPY (EGD) WITH PROPOFOL N/A 11/26/2021   Procedure: ESOPHAGOGASTRODUODENOSCOPY (EGD) WITH PROPOFOL;  Surgeon: Doran Stabler, MD;  Location: [facility];  Service: Gastroenterology;  Laterality: N/A;   EYE SURGERY     IABP INSERTION N/A 01/30/2021   Procedure: IABP Insertion;  Surgeon: Leonie Man, MD;  Location: [facility];  Service: Cardiovascular;  Laterality: N/A;   RIGHT/LEFT HEART CATH AND CORONARY ANGIOGRAPHY N/A 01/30/2021   Procedure: RIGHT/LEFT HEART CATH AND CORONARY ANGIOGRAPHY;  Surgeon:  Leonie Man, MD;  Location: [facility];  Service: Cardiovascular;  Laterality: N/A;   Social History:  reports that he has never smoked. He has never used smokeless tobacco. He reports that he does not drink alcohol and does not use drugs.  No Known Allergies  History reviewed. No pertinent family history.  Prior to Admission medications   Medication Sig Start Date End Date Taking? Authorizing Provider  acetaminophen (TYLENOL) 325 MG tablet Take 2 tablets (650 mg total) by mouth every 4 (four) hours as needed for headache or mild pain. 07/04/21   Isaiah Serge, NP  aspirin 81 MG EC tablet Take 1 tablet (81 mg total) by mouth daily. Swallow whole. 11/28/21   Thurnell Lose, MD  atorvastatin (LIPITOR) 80 MG tablet Take 1 tablet (80 mg total) by mouth daily. 02/06/21   Hosie Poisson, MD  carvedilol (COREG) 6.25 MG tablet Take 1 tablet (6.25 mg total) by mouth 2 (two) times daily with a meal. 12/14/21 01/13/22  Darliss Cheney, MD  clopidogrel (PLAVIX) 75 MG tablet Take 1 tablet (75 mg total) by mouth daily. 02/06/21   Hosie Poisson, MD  ferrous sulfate 325 (65 FE) MG tablet Take 1 tablet (325 mg total) by mouth 2 (two) times daily with a meal. 11/28/21   Thurnell Lose, MD  finasteride (PROSCAR) 5 MG tablet Take 1 tablet (5 mg total) by mouth daily. 11/21/21   Lavina Hamman, MD  isosorbide-hydrALAZINE (BIDIL) 20-37.5 MG tablet Take 1 tablet by mouth 2 (two) times daily. 01/09/22   Domenic Polite, MD  JARDIANCE 10 MG TABS tablet Take 10 mg by mouth daily. 11/10/21   [provider]  metFORMIN (GLUCOPHAGE)  500 MG tablet Take 500 mg by mouth in the morning and at bedtime. 06/10/21   [provider]  nitroGLYCERIN (NITROSTAT) 0.4 MG SL tablet Place 1 tablet (0.4 mg total) under the tongue every 5 (five) minutes x 3 doses as needed for chest pain. 02/05/21   Hosie Poisson, MD  pantoprazole (PROTONIX) 40 MG tablet Take 1 tablet (40 mg total) by mouth 2 (two) times daily  before a meal. 11/28/21   Thurnell Lose, MD  polyethylene glycol (MIRALAX / GLYCOLAX) 17 g packet Take 17 g by mouth daily as needed for mild constipation. 01/09/22   Domenic Polite, MD  tamsulosin (FLOMAX) 0.4 MG CAPS capsule Take 2 capsules (0.8 mg total) by mouth daily. 11/28/21   Thurnell Lose, MD  torsemide 40 MG TABS Take 40 mg by mouth daily. 01/10/22   Domenic Polite, MD  vitamin B-12 (CYANOCOBALAMIN) 500 MCG tablet Take 1 tablet (500 mcg total) by mouth daily. 11/21/21   Lavina Hamman, MD    Physical Exam: Vitals:   09/03/22 1612 09/03/22 1744 09/03/22 1800 09/03/22 1900  BP:  (!) 99/46 (!) 80/34 (!) 84/34  Pulse:  70 (!) 59 73  Resp:  18 18   Temp:  98 F (36.7 C)    TempSrc:  Axillary    SpO2:   99% 100%  Weight: 49.5 kg     Height: '5\' 2"'$  (1.575 m) '5\' 2"'$  (1.575 m)     General exam: Confused, agitated and not following commands. Respiratory system: Clear to auscultation. Respiratory effort normal.  Good saturation appreciated on exam. Cardiovascular system:RRR. No rubs or gallops; no JVD. Gastrointestinal system: Abdomen is nondistended, soft and nontender. No organomegaly or masses felt. Normal bowel sounds heard. Central nervous system: Moving 4 limbs spontaneously; no focal deficits.  Patient is disoriented. Extremities: No cyanosis or clubbing. Skin: No petechiae. Psychiatry: Judgement and insight unable to be evaluated currently due to current mentation.  Data Reviewed: Chest x-ray without acute cardiopulmonary process -CT scan of the head without acute intracranial abnormalities -Urinalysis demonstrating no nitrites, trace leukocyte esterase and negative protein and ketones. Lactic acid 3.0 CBC with a white blood cells of 14.7, hemoglobin 12.9 platelet count 572 K Comprehensive metabolic panel with sodium 149, glucose 977, bicarb 26, chloride 109, potassium 5.1 BUN 88 and creatinine 3.87.  Assessment and Plan: Acute metabolic  encephalopathy Multifactorial; in the setting of hyperglycemia, hyponatremia, worsening renal function with elevated BUN and dehydration. -No abnormalities appreciated on CT scan -Patient without abnormalities to suggest pneumonia -UA not suggesting infection -Holding on antibiotics currently -Provide fluid resuscitation and treat hyperosmolar hyperglycemic state.  * 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 ([employer]) - Patient blood sugar in the 970 range at time of admission -We will update A1c -Fluid resuscitation and insulin drip will be provided -Follow CBGs response.  BPH (benign prostatic hyperplasia) - Continue the use of Flomax and Proscar.  GERD (gastroesophageal reflux disease) - Continue PPI.  Chronic systolic CHF (congestive heart failure) ([facility]) - Stable and currently compensated -Follow daily weights and strict I's and O's -Judicious fluid resuscitation as patient presented with hyperosmolar hyperglycemic crisis. -Holding diuretics initially given worsening renal function.  Mixed hyperlipidemia - Continue statin.  Primary hypertension - Blood pressure stable overall -Resume home antihypertensive agents.  CAD, multiple vessel - No chest pain or shortness of breath currently -Continue home medication and risk factor modification -Telemetry monitoring.  AKI-CKD 3B - In the setting of dehydration -Hold nephrotoxic  agents -Provide fluid resuscitation -Follow renal function trend.      Advance Care Planning:   Code Status: DNR   Consults: None  Family Communication: None  Severity of Illness: The appropriate patient status for this patient is INPATIENT. Inpatient status is judged to be reasonable and necessary in order to provide the required intensity of service to ensure the patient's safety. The patient's presenting symptoms, physical exam findings, and initial radiographic and laboratory data in the context of their chronic  comorbidities is felt to place them at high risk for further clinical deterioration. Furthermore, it is not anticipated that the patient will be medically stable for discharge from the hospital within 2 midnights of admission.   * I certify that at the point of admission it is my clinical judgment that the patient will require inpatient hospital care spanning beyond 2 midnights from the point of admission due to high intensity of service, high risk for further deterioration and high frequency of surveillance required.*  Author: Barton Dubois, MD 09/03/2022 7:17 PM  For on call review [email or web address].

## 2022-09-03 NOTE — ED Notes (Signed)
Attempted IV access multiple times and not successful do the patient tensing up and pulling away.  Contacted SWOT nurse to use ultrasound.

## 2022-09-03 NOTE — ED Notes (Signed)
Contacted the [facility] for a Air cabin crew.  Made aware that we are short staffed and don't have anyone to sit.  Charge nurse made aware.Patient continues to pull wires and other equipment off as well of twisting and turning and trying to climb out of the bed.  Mittens placed on the patient.  Unable to find an interpreter to speak the language.  Contacted patients son who is located in [place].  Stated that no one lives near here.

## 2022-09-03 NOTE — Assessment & Plan Note (Addendum)
-  Resume the use of statins when able to tolerate p.o.'s.

## 2022-09-03 NOTE — Assessment & Plan Note (Addendum)
-  CBGs in the 60 range earlier. -Treated with D50 amp -Currently n.p.o. for anticipated endoscopy; -Hypernatremia currently resolved; IV fluids rates adjusted, patient's diet advance and will continue to follow CBGs to further adjust hypoglycemic regimen. -Overall hyperosmolar state corrected.

## 2022-09-03 NOTE — Progress Notes (Signed)
Per [facility] center, pt's daughter and son do not answer the factilities number when they call to advise about the patient. States they have to use a private phone number before they will answer. I called daughter for admission information with no answer from private number. Called son from private number who answered and was able to answer most questions. He stated he hasnt seen his father since last summer and we should call daughter bc he lives in [place].

## 2022-09-03 NOTE — Assessment & Plan Note (Addendum)
-  In the setting of dehydration -Continue to hold nephrotoxic agents -Continue IV fluid resuscitation -Continue to follow renal function trend. -Creatinine down to 3.1 currently.

## 2022-09-03 NOTE — Assessment & Plan Note (Addendum)
-  Holding Flomax and Proscar in the setting of low blood pressure -No signs of urinary retention currently. -Close monitoring for any symptoms.

## 2022-09-03 NOTE — Assessment & Plan Note (Addendum)
-  Continue PPI. -Dose adjusted to twice a day -Patient with positive bloody stool; appears to be maroon in nature and most likely associated with hemorrhoids. -Anusol will be provided -Heparin for DVT prophylaxis discontinued; SCDs in place -Once medically stable will discuss with GI service.

## 2022-09-03 NOTE — ED Notes (Signed)
Patient transported to CT 

## 2022-09-03 NOTE — Assessment & Plan Note (Addendum)
-  No chest pain or shortness of breath currently -Continue home medication once blood pressure stabilized and continue risk factor modification -Continue telemetry monitoring.

## 2022-09-04 DIAGNOSIS — N179 Acute kidney failure, unspecified: Secondary | ICD-10-CM | POA: Diagnosis not present

## 2022-09-04 DIAGNOSIS — N400 Enlarged prostate without lower urinary tract symptoms: Secondary | ICD-10-CM | POA: Diagnosis not present

## 2022-09-04 DIAGNOSIS — I2510 Atherosclerotic heart disease of native coronary artery without angina pectoris: Secondary | ICD-10-CM | POA: Diagnosis not present

## 2022-09-04 DIAGNOSIS — E1100 Type 2 diabetes mellitus with hyperosmolarity without nonketotic hyperglycemic-hyperosmolar coma (NKHHC): Secondary | ICD-10-CM | POA: Diagnosis not present

## 2022-09-04 DIAGNOSIS — N184 Chronic kidney disease, stage 4 (severe): Secondary | ICD-10-CM

## 2022-09-04 LAB — GLUCOSE, CAPILLARY
Glucose-Capillary: 114 mg/dL — ABNORMAL HIGH (ref 70–99)
Glucose-Capillary: 128 mg/dL — ABNORMAL HIGH (ref 70–99)
Glucose-Capillary: 134 mg/dL — ABNORMAL HIGH (ref 70–99)
Glucose-Capillary: 139 mg/dL — ABNORMAL HIGH (ref 70–99)
Glucose-Capillary: 157 mg/dL — ABNORMAL HIGH (ref 70–99)
Glucose-Capillary: 159 mg/dL — ABNORMAL HIGH (ref 70–99)
Glucose-Capillary: 166 mg/dL — ABNORMAL HIGH (ref 70–99)
Glucose-Capillary: 167 mg/dL — ABNORMAL HIGH (ref 70–99)
Glucose-Capillary: 176 mg/dL — ABNORMAL HIGH (ref 70–99)
Glucose-Capillary: 178 mg/dL — ABNORMAL HIGH (ref 70–99)
Glucose-Capillary: 178 mg/dL — ABNORMAL HIGH (ref 70–99)
Glucose-Capillary: 179 mg/dL — ABNORMAL HIGH (ref 70–99)
Glucose-Capillary: 180 mg/dL — ABNORMAL HIGH (ref 70–99)
Glucose-Capillary: 190 mg/dL — ABNORMAL HIGH (ref 70–99)
Glucose-Capillary: 192 mg/dL — ABNORMAL HIGH (ref 70–99)
Glucose-Capillary: 192 mg/dL — ABNORMAL HIGH (ref 70–99)
Glucose-Capillary: 192 mg/dL — ABNORMAL HIGH (ref 70–99)
Glucose-Capillary: 193 mg/dL — ABNORMAL HIGH (ref 70–99)
Glucose-Capillary: 209 mg/dL — ABNORMAL HIGH (ref 70–99)
Glucose-Capillary: 219 mg/dL — ABNORMAL HIGH (ref 70–99)
Glucose-Capillary: 99 mg/dL (ref 70–99)
Glucose-Capillary: >600 mg/dL (ref 70–99)

## 2022-09-04 LAB — HEMOGLOBIN A1C
Hgb A1c MFr Bld: 11 % — ABNORMAL HIGH (ref 4.8–5.6)
Mean Plasma Glucose: 269 mg/dL

## 2022-09-04 LAB — OSMOLALITY: Osmolality: 414 mOsm/kg (ref 275–295)

## 2022-09-04 LAB — BASIC METABOLIC PANEL
Anion gap: 10 (ref 5–15)
Anion gap: 11 (ref 5–15)
BUN: 97 mg/dL — ABNORMAL HIGH (ref 8–23)
BUN: 96 mg/dL — ABNORMAL HIGH (ref 8–23)
CO2: 26 mmol/L (ref 22–32)
CO2: 27 mmol/L (ref 22–32)
Calcium: 9 mg/dL (ref 8.9–10.3)
Calcium: 8.9 mg/dL (ref 8.9–10.3)
Chloride: 122 mmol/L — ABNORMAL HIGH (ref 98–111)
Chloride: 122 mmol/L — ABNORMAL HIGH (ref 98–111)
Creatinine, Ser: 3.2 mg/dL — ABNORMAL HIGH (ref 0.61–1.24)
Creatinine, Ser: 3.19 mg/dL — ABNORMAL HIGH (ref 0.61–1.24)
GFR, Estimated: 18 mL/min — ABNORMAL LOW (ref >60)
GFR, Estimated: 19 mL/min — ABNORMAL LOW (ref >60)
Glucose, Bld: 145 mg/dL — ABNORMAL HIGH (ref 70–99)
Glucose, Bld: 242 mg/dL — ABNORMAL HIGH (ref 70–99)
Potassium: 3.7 mmol/L (ref 3.5–5.1)
Potassium: 4.1 mmol/L (ref 3.5–5.1)
Sodium: 158 mmol/L — ABNORMAL HIGH (ref 135–145)
Sodium: 160 mmol/L — ABNORMAL HIGH (ref 135–145)

## 2022-09-04 MED ORDER — ISOSORB DINITRATE-HYDRALAZINE 20-37.5 MG PO TABS: 0.5000 | ORAL_TABLET | Freq: Two times a day (BID) | ORAL | Status: DC

## 2022-09-04 MED ORDER — CARVEDILOL 3.125 MG PO TABS: 3.1250 mg | ORAL_TABLET | Freq: Two times a day (BID) | ORAL | Status: DC

## 2022-09-04 MED ORDER — ZIPRASIDONE MESYLATE 20 MG IM SOLR
10.0000 mg | Freq: Once | INTRAMUSCULAR | Status: AC
  Administered 2022-09-04: 10 mg via INTRAMUSCULAR
  Filled 2022-09-04 (×2): qty 20

## 2022-09-04 MED ORDER — DEXTROSE 5 % IV SOLN: INTRAVENOUS | Status: DC

## 2022-09-04 MED ORDER — HYDROCORTISONE ACETATE 25 MG RE SUPP
25.0000 mg | Freq: Two times a day (BID) | RECTAL | Status: DC
  Administered 2022-09-05 – 2022-09-06 (×2): 25 mg via RECTAL
  Filled 2022-09-04 (×2): qty 1

## 2022-09-04 MED ORDER — INSULIN DETEMIR 100 UNIT/ML ~~LOC~~ SOLN
5.0000 [IU] | Freq: Every day | SUBCUTANEOUS | Status: DC
  Administered 2022-09-04: 5 [IU] via SUBCUTANEOUS
  Filled 2022-09-04 (×2): qty 0.05

## 2022-09-04 MED ORDER — INSULIN ASPART 100 UNIT/ML IJ SOLN
0.0000 [IU] | Freq: Three times a day (TID) | INTRAMUSCULAR | Status: DC
  Administered 2022-09-05: 3 [IU] via SUBCUTANEOUS
  Administered 2022-09-05: 11 [IU] via SUBCUTANEOUS
  Administered 2022-09-06: 5 [IU] via SUBCUTANEOUS
  Administered 2022-09-06: 8 [IU] via SUBCUTANEOUS
  Administered 2022-09-09: 3 [IU] via SUBCUTANEOUS
  Administered 2022-09-10 (×2): 2 [IU] via SUBCUTANEOUS

## 2022-09-04 MED ORDER — INSULIN ASPART 100 UNIT/ML IJ SOLN: 0.0000 [IU] | Freq: Every day | INTRAMUSCULAR | Status: DC

## 2022-09-04 NOTE — Progress Notes (Signed)
Patient very agitated gave Haladol then had to disimpact large soft green stool then patient had a substantial bleed with clots. Saved to show  MD who will test Hgb again. BG 99 insulin pump off continue to monitor.

## 2022-09-04 NOTE — Progress Notes (Signed)
Progress Note   Patient: Randy [name] [id number] DOB: February 02, 1941 DOA: 09/03/2022     1 DOS: the patient was seen and examined on 09/04/2022   Brief hospital course: Randy [name] is a 81 y.o. male with medical history significant of chronic systolic heart failure, hypertension, hyperlipidemia, coronary artery disease, GERD and BPH; who was brought to the hospital secondary to change in mentation and elevated blood sugar. Patient mentation not allowing for him to participate in interview; currently agitated confused and not following commands. CT head demonstrated no acute intracranial abnormalities; blood work suggesting the presence of hyperosmolar hyperglycemic crisis with blood sugar in the 970 range.  Normal bicarb and worsening renal function. No acute signs of infection appreciated.   IV fluids, insulin therapy initiated.  TRH contacted to place patient in the hospital for further evaluation and management.  Assessment and Plan: Acute metabolic encephalopathy -Multifactorial: In the setting of hyper natremia, hyperglycemic state, uremia/worsening renal function and dehydration -Continue IV fluids -Continue supportive care, -Continue constant reorientation and continue the use of as needed Haldol. -Sitter at bedside has also been ordered.  * 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 ([place]) - CBGs in the 160-200 range -Currently unable to eat or drink spontaneously -Still with hyponatremia -Continue D5 W and the use of insulin therapy. -Continue to follow CBGs. -Overall hyperosmolar state corrected.  BPH (benign prostatic hyperplasia) -Continue the use of Flomax and Proscar. -No signs of urinary retention currently.  GERD (gastroesophageal reflux disease) -Continue PPI. -Dose adjusted to twice a day -Patient with positive bloody stool; appears to be maroon in nature and most likely associated with hemorrhoids. -Anusol will be  provided -Heparin for DVT prophylaxis discontinued; SCDs in place -Once medically stable will discuss with GI service.  Chronic systolic CHF (congestive heart failure) ([facility]) -Stable and currently compensated -Continue to follow daily weights and strict I's and O's -Continue judicious fluid resuscitation as patient presented with hyperosmolar hyperglycemic crisis and hyponatremia. -Continue holding diuretics initially given worsening renal function.  Mixed hyperlipidemia -Continue statin. -As long as patient is able to take p.o.'s.  Primary hypertension -Blood pressure stable overall -Continue current antihypertensive agents; follow-up vital signs.  CAD, multiple vessel -No chest pain or shortness of breath currently -Continue home medication and risk factor modification -Continue telemetry monitoring.  AKI-CKD 3B -In the setting of dehydration -Continue to hold nephrotoxic agents -Continue IV fluid resuscitation -Continue to follow renal function trend. -Creatinine down to 3.1 currently.    Subjective:  Still confused/encephalopathic.  No fever, no chest pain, no nausea, no vomiting, no shortness of breath.  Patient having difficulty taking/swallowing medications and is not following commands.  CBGs now corrected.  Physical Exam: Vitals:   09/04/22 1330 09/04/22 1400 09/04/22 1500 09/04/22 1600  BP:  (!) 125/41 (!) 104/44 (!) 110/44  Pulse: 60 60 74 72  Resp: '20 15 17 '$ (!) 8  Temp:      TempSrc:      SpO2: 99% 92% 99% 96%  Weight:      Height:       General exam: Confused, disoriented and at times agitated.  Afebrile, no chest pain, no shortness of breath, no nausea or vomiting. Respiratory system: No wheezing, no crackles, no using accessory muscle.  Good saturation on room air. Cardiovascular system:RRR.  No rubs, no gallops, no JVD. Gastrointestinal system: Abdomen is nondistended, soft and nontender. No organomegaly or masses felt. Normal bowel sounds  heard. Central nervous system: Unable to follow commands; moving  4 limbs spontaneously. Extremities: No cyanosis, clubbing or edema. Skin: No petechiae. Psychiatry: Judgement and insight appear impaired in the setting of encephalopathy.  Data Reviewed: CBGs in the 170-200 range. Basic metabolic panel: Sodium 660, potassium 4.1, chloride 122, BUN 96, creatinine 3.1 and GFR 19.  Anion gap 11.  Family Communication: No family at bedside.  Disposition: Status is: Inpatient Remains inpatient appropriate because: Still requiring IV therapy; mentation now back to baseline.   Planned Discharge Destination: Skilled nursing facility long-term resident.   Author: Barton Dubois, MD 09/04/2022 5:12 PM  For on call review [email or web address].

## 2022-09-04 NOTE — Plan of Care (Signed)
Off insulin pump last BG 99, still confused and aggressively fighting staff gave Haladol. Had rectal bleed see note. Re-checking Hgb, continue to monitor. Problem: Education: Goal: Knowledge of General Education information will improve Description: Including pain rating scale, medication(s)/side effects and non-pharmacologic comfort measures Outcome: Progressing   Problem: Health Behavior/Discharge Planning: Goal: Ability to manage health-related needs will improve Outcome: Progressing   Problem: Clinical Measurements: Goal: Ability to maintain clinical measurements within normal limits will improve Outcome: Progressing Goal: Will remain free from infection Outcome: Progressing Goal: Diagnostic test results will improve Outcome: Progressing Goal: Respiratory complications will improve Outcome: Progressing Goal: Cardiovascular complication will be avoided Outcome: Progressing   Problem: Activity: Goal: Risk for activity intolerance will decrease Outcome: Progressing   Problem: Nutrition: Goal: Adequate nutrition will be maintained Outcome: Progressing   Problem: Coping: Goal: Level of anxiety will decrease Outcome: Progressing   Problem: Elimination: Goal: Will not experience complications related to bowel motility Outcome: Progressing Goal: Will not experience complications related to urinary retention Outcome: Progressing   Problem: Pain Managment: Goal: General experience of comfort will improve Outcome: Progressing   Problem: Safety: Goal: Ability to remain free from injury will improve Outcome: Progressing   Problem: Skin Integrity: Goal: Risk for impaired skin integrity will decrease Outcome: Progressing   Problem: Education: Goal: Ability to describe self-care measures that may prevent or decrease complications (Diabetes Survival Skills Education) will improve Outcome: Progressing Goal: Individualized Educational Video(s) Outcome: Progressing    Problem: Coping: Goal: Ability to adjust to condition or change in health will improve Outcome: Progressing   Problem: Fluid Volume: Goal: Ability to maintain a balanced intake and output will improve Outcome: Progressing   Problem: Health Behavior/Discharge Planning: Goal: Ability to identify and utilize available resources and services will improve Outcome: Progressing Goal: Ability to manage health-related needs will improve Outcome: Progressing   Problem: Metabolic: Goal: Ability to maintain appropriate glucose levels will improve Outcome: Progressing   Problem: Nutritional: Goal: Maintenance of adequate nutrition will improve Outcome: Progressing Goal: Progress toward achieving an optimal weight will improve Outcome: Progressing   Problem: Skin Integrity: Goal: Risk for impaired skin integrity will decrease Outcome: Progressing   Problem: Tissue Perfusion: Goal: Adequacy of tissue perfusion will improve Outcome: Progressing   Problem: Education: Goal: Ability to describe self-care measures that may prevent or decrease complications (Diabetes Survival Skills Education) will improve Outcome: Progressing Goal: Individualized Educational Video(s) Outcome: Progressing   Problem: Cardiac: Goal: Ability to maintain an adequate cardiac output will improve Outcome: Progressing   Problem: Health Behavior/Discharge Planning: Goal: Ability to identify and utilize available resources and services will improve Outcome: Progressing Goal: Ability to manage health-related needs will improve Outcome: Progressing   Problem: Fluid Volume: Goal: Ability to achieve a balanced intake and output will improve Outcome: Progressing   Problem: Metabolic: Goal: Ability to maintain appropriate glucose levels will improve Outcome: Progressing   Problem: Nutritional: Goal: Maintenance of adequate nutrition will improve Outcome: Progressing Goal: Maintenance of adequate weight for  body size and type will improve Outcome: Progressing   Problem: Respiratory: Goal: Will regain and/or maintain adequate ventilation Outcome: Progressing   Problem: Urinary Elimination: Goal: Ability to achieve and maintain adequate renal perfusion and functioning will improve Outcome: Progressing

## 2022-09-04 NOTE — Plan of Care (Signed)
BG has been WNL's all shift. Still having diarrhea with some blood from the chemo. No c/o of pain except when cleaning up stool patient is a bit raw in the perineum, MD aware. Continue to monitor. Problem: Education: Goal: Knowledge of General Education information will improve Description: Including pain rating scale, medication(s)/side effects and non-pharmacologic comfort measures 09/04/2022 1610 by Delila Pereyra, RN Outcome: Progressing 09/04/2022 1608 by Delila Pereyra, RN Outcome: Progressing   Problem: Health Behavior/Discharge Planning: Goal: Ability to manage health-related needs will improve 09/04/2022 1610 by Delila Pereyra, RN Outcome: Progressing 09/04/2022 1608 by Delila Pereyra, RN Outcome: Progressing   Problem: Clinical Measurements: Goal: Ability to maintain clinical measurements within normal limits will improve 09/04/2022 1610 by Delila Pereyra, RN Outcome: Progressing 09/04/2022 1608 by Delila Pereyra, RN Outcome: Progressing Goal: Will remain free from infection 09/04/2022 1610 by Delila Pereyra, RN Outcome: Progressing 09/04/2022 1608 by Delila Pereyra, RN Outcome: Progressing Goal: Diagnostic test results will improve 09/04/2022 1610 by Delila Pereyra, RN Outcome: Progressing 09/04/2022 1608 by Delila Pereyra, RN Outcome: Progressing Goal: Respiratory complications will improve 09/04/2022 1610 by Delila Pereyra, RN Outcome: Progressing 09/04/2022 1608 by Delila Pereyra, RN Outcome: Progressing Goal: Cardiovascular complication will be avoided 09/04/2022 1610 by Delila Pereyra, RN Outcome: Progressing 09/04/2022 1608 by Delila Pereyra, RN Outcome: Progressing   Problem: Activity: Goal: Risk for activity intolerance will decrease 09/04/2022 1610 by Delila Pereyra, RN Outcome: Progressing 09/04/2022 1608 by Delila Pereyra, RN Outcome: Progressing   Problem: Nutrition: Goal: Adequate nutrition will be maintained 09/04/2022 1610 by Delila Pereyra, RN Outcome: Progressing 09/04/2022 1608 by Delila Pereyra, RN Outcome: Progressing   Problem: Coping: Goal: Level of anxiety will decrease 09/04/2022 1610 by Delila Pereyra, RN Outcome: Progressing 09/04/2022 1608 by Delila Pereyra, RN Outcome: Progressing   Problem: Elimination: Goal: Will not experience complications related to bowel motility 09/04/2022 1610 by Delila Pereyra, RN Outcome: Progressing 09/04/2022 1608 by Delila Pereyra, RN Outcome: Progressing Goal: Will not experience complications related to urinary retention 09/04/2022 1610 by Delila Pereyra, RN Outcome: Progressing 09/04/2022 1608 by Delila Pereyra, RN Outcome: Progressing   Problem: Pain Managment: Goal: General experience of comfort will improve 09/04/2022 1610 by Delila Pereyra, RN Outcome: Progressing 09/04/2022 1608 by Delila Pereyra, RN Outcome: Progressing   Problem: Safety: Goal: Ability to remain free from injury will improve 09/04/2022 1610 by Delila Pereyra, RN Outcome: Progressing 09/04/2022 1608 by Delila Pereyra, RN Outcome: Progressing   Problem: Skin Integrity: Goal: Risk for impaired skin integrity will decrease 09/04/2022 1610 by Delila Pereyra, RN Outcome: Progressing 09/04/2022 1608 by Delila Pereyra, RN Outcome: Progressing   Problem: Education: Goal: Ability to describe self-care measures that may prevent or decrease complications (Diabetes Survival Skills Education) will improve 09/04/2022 1610 by Delila Pereyra, RN Outcome: Progressing 09/04/2022 1608 by Delila Pereyra, RN Outcome: Progressing Goal: Individualized Educational Video(s) 09/04/2022 1610 by Delila Pereyra, RN Outcome: Progressing 09/04/2022 1608 by Delila Pereyra, RN Outcome: Progressing   Problem: Coping: Goal: Ability to adjust to condition or change in health will improve 09/04/2022 1610 by Delila Pereyra, RN Outcome: Progressing 09/04/2022 1608 by Delila Pereyra, RN Outcome: Progressing    Problem: Fluid Volume: Goal: Ability to maintain a balanced intake and output will improve 09/04/2022 1610 by Delila Pereyra, RN Outcome: Progressing 09/04/2022 1608 by Vito Backers  F, RN Outcome: Progressing   Problem: Health Behavior/Discharge Planning: Goal: Ability to identify and utilize available resources and services will improve 09/04/2022 1610 by Delila Pereyra, RN Outcome: Progressing 09/04/2022 1608 by Delila Pereyra, RN Outcome: Progressing Goal: Ability to manage health-related needs will improve 09/04/2022 1610 by Delila Pereyra, RN Outcome: Progressing 09/04/2022 1608 by Delila Pereyra, RN Outcome: Progressing   Problem: Metabolic: Goal: Ability to maintain appropriate glucose levels will improve 09/04/2022 1610 by Delila Pereyra, RN Outcome: Progressing 09/04/2022 1608 by Delila Pereyra, RN Outcome: Progressing   Problem: Nutritional: Goal: Maintenance of adequate nutrition will improve 09/04/2022 1610 by Delila Pereyra, RN Outcome: Progressing 09/04/2022 1608 by Delila Pereyra, RN Outcome: Progressing Goal: Progress toward achieving an optimal weight will improve 09/04/2022 1610 by Delila Pereyra, RN Outcome: Progressing 09/04/2022 1608 by Delila Pereyra, RN Outcome: Progressing   Problem: Skin Integrity: Goal: Risk for impaired skin integrity will decrease 09/04/2022 1610 by Delila Pereyra, RN Outcome: Progressing 09/04/2022 1608 by Delila Pereyra, RN Outcome: Progressing   Problem: Tissue Perfusion: Goal: Adequacy of tissue perfusion will improve 09/04/2022 1610 by Delila Pereyra, RN Outcome: Progressing 09/04/2022 1608 by Delila Pereyra, RN Outcome: Progressing   Problem: Education: Goal: Ability to describe self-care measures that may prevent or decrease complications (Diabetes Survival Skills Education) will improve 09/04/2022 1610 by Delila Pereyra, RN Outcome: Progressing 09/04/2022 1608 by Delila Pereyra, RN Outcome:  Progressing Goal: Individualized Educational Video(s) 09/04/2022 1610 by Delila Pereyra, RN Outcome: Progressing 09/04/2022 1608 by Delila Pereyra, RN Outcome: Progressing   Problem: Cardiac: Goal: Ability to maintain an adequate cardiac output will improve 09/04/2022 1610 by Delila Pereyra, RN Outcome: Progressing 09/04/2022 1608 by Delila Pereyra, RN Outcome: Progressing   Problem: Health Behavior/Discharge Planning: Goal: Ability to identify and utilize available resources and services will improve 09/04/2022 1610 by Delila Pereyra, RN Outcome: Progressing 09/04/2022 1608 by Delila Pereyra, RN Outcome: Progressing Goal: Ability to manage health-related needs will improve 09/04/2022 1610 by Delila Pereyra, RN Outcome: Progressing 09/04/2022 1608 by Delila Pereyra, RN Outcome: Progressing   Problem: Fluid Volume: Goal: Ability to achieve a balanced intake and output will improve 09/04/2022 1610 by Delila Pereyra, RN Outcome: Progressing 09/04/2022 1608 by Delila Pereyra, RN Outcome: Progressing   Problem: Metabolic: Goal: Ability to maintain appropriate glucose levels will improve 09/04/2022 1610 by Delila Pereyra, RN Outcome: Progressing 09/04/2022 1608 by Delila Pereyra, RN Outcome: Progressing   Problem: Nutritional: Goal: Maintenance of adequate nutrition will improve 09/04/2022 1610 by Delila Pereyra, RN Outcome: Progressing 09/04/2022 1608 by Delila Pereyra, RN Outcome: Progressing Goal: Maintenance of adequate weight for body size and type will improve 09/04/2022 1610 by Delila Pereyra, RN Outcome: Progressing 09/04/2022 1608 by Delila Pereyra, RN Outcome: Progressing   Problem: Respiratory: Goal: Will regain and/or maintain adequate ventilation 09/04/2022 1610 by Delila Pereyra, RN Outcome: Progressing 09/04/2022 1608 by Delila Pereyra, RN Outcome: Progressing   Problem: Urinary Elimination: Goal: Ability to achieve and maintain adequate renal  perfusion and functioning will improve 09/04/2022 1610 by Delila Pereyra, RN Outcome: Progressing 09/04/2022 1608 by Delila Pereyra, RN Outcome: Progressing

## 2022-09-04 NOTE — Inpatient Diabetes Management (Signed)
Inpatient Diabetes Program Recommendations  AACE/ADA: New Consensus Statement on Inpatient Glycemic Control   Target Ranges:  Prepandial:   less than 140 mg/dL      Peak postprandial:   less than 180 mg/dL (1-2 hours)      Critically ill patients:  140 - 180 mg/dL    Latest Reference Range & Units 09/04/22 00:31 09/04/22 02:28 09/04/22 03:28 09/04/22 04:26 09/04/22 05:33 09/04/22 06:25  Glucose-Capillary 70 - 99 mg/dL 209 (H) 157 (H) 128 (H) 166 (H) 179 (H) 192 (H)    Latest Reference Range & Units 09/04/22 03:54  CO2 22 - 32 mmol/L 26  Glucose 70 - 99 mg/dL 145 (H)  Anion gap 5 - 15  10    Latest Reference Range & Units 09/03/22 12:58  CO2 22 - 32 mmol/L 26  Glucose 70 - 99 mg/dL 977 (HH)  Anion gap 5 - 15  14   Review of Glycemic Control  Diabetes history: DM2 Outpatient Diabetes medications: Jardiance 10 mg daily, Metformin 500 mg BID Current orders for Inpatient glycemic control: IV insulin  Inpatient Diabetes Program Recommendations:    Insulin: Once provider is ready to transition from IV to SQ, please consider ordering Semglee 5 units Q24H, CBGs Q4H, Novolog 0-9 units Q4H.  NOTE: Per triage note, "Patient brought in by [facility] from [facility].  Staff stated that the patient was not acting right and that his finger tips are blue x1 week.  Staff told EMS that the patient is not diabetic and then checked his paperwork and stated that he is and they had not been checking his CBG.  EMS states that the patient is uncooperative. CBG reads high."   Patient was inpatient 12/31/21-01/10/22 and was discharged to [facility] on 01/10/22 and per discharge summary patient was prescribed Metformin 500 mg BID and Jardiance 10 mg daily for DM. [facility] and spoke with Dorthy (nursing). She confirms that patient was being given Metformin 500 mg BID and Jardiance 10 mg daily. She verified that CBGs were not being checked at the  facility.   Thanks, Barnie Alderman, RN, MSN, [place] [facility] Diabetes Coordinator Inpatient Diabetes Program [phone] (Team Pager from 8am to 5pm)'

## 2022-09-05 ENCOUNTER — Inpatient Hospital Stay (HOSPITAL_COMMUNITY): Payer: Medicare Other

## 2022-09-05 ENCOUNTER — Inpatient Hospital Stay: Payer: Self-pay

## 2022-09-05 DIAGNOSIS — K625 Hemorrhage of anus and rectum: Secondary | ICD-10-CM

## 2022-09-05 DIAGNOSIS — K219 Gastro-esophageal reflux disease without esophagitis: Secondary | ICD-10-CM | POA: Diagnosis not present

## 2022-09-05 DIAGNOSIS — K922 Gastrointestinal hemorrhage, unspecified: Secondary | ICD-10-CM

## 2022-09-05 DIAGNOSIS — R578 Other shock: Secondary | ICD-10-CM | POA: Diagnosis not present

## 2022-09-05 DIAGNOSIS — E1100 Type 2 diabetes mellitus with hyperosmolarity without nonketotic hyperglycemic-hyperosmolar coma (NKHHC): Secondary | ICD-10-CM | POA: Diagnosis not present

## 2022-09-05 DIAGNOSIS — R571 Hypovolemic shock: Secondary | ICD-10-CM

## 2022-09-05 DIAGNOSIS — N179 Acute kidney failure, unspecified: Secondary | ICD-10-CM | POA: Diagnosis not present

## 2022-09-05 DIAGNOSIS — N400 Enlarged prostate without lower urinary tract symptoms: Secondary | ICD-10-CM | POA: Diagnosis not present

## 2022-09-05 DIAGNOSIS — I2510 Atherosclerotic heart disease of native coronary artery without angina pectoris: Secondary | ICD-10-CM | POA: Diagnosis not present

## 2022-09-05 LAB — GLUCOSE, CAPILLARY
Glucose-Capillary: 102 mg/dL — ABNORMAL HIGH (ref 70–99)
Glucose-Capillary: 175 mg/dL — ABNORMAL HIGH (ref 70–99)
Glucose-Capillary: 198 mg/dL — ABNORMAL HIGH (ref 70–99)
Glucose-Capillary: 233 mg/dL — ABNORMAL HIGH (ref 70–99)
Glucose-Capillary: 235 mg/dL — ABNORMAL HIGH (ref 70–99)
Glucose-Capillary: 308 mg/dL — ABNORMAL HIGH (ref 70–99)
Glucose-Capillary: 81 mg/dL (ref 70–99)
Glucose-Capillary: 92 mg/dL (ref 70–99)

## 2022-09-05 LAB — BASIC METABOLIC PANEL
Anion gap: 13 (ref 5–15)
BUN: 79 mg/dL — ABNORMAL HIGH (ref 8–23)
CO2: 20 mmol/L — ABNORMAL LOW (ref 22–32)
Calcium: 8.2 mg/dL — ABNORMAL LOW (ref 8.9–10.3)
Chloride: 122 mmol/L — ABNORMAL HIGH (ref 98–111)
Creatinine, Ser: 2.78 mg/dL — ABNORMAL HIGH (ref 0.61–1.24)
GFR, Estimated: 22 mL/min — ABNORMAL LOW (ref >60)
Glucose, Bld: 316 mg/dL — ABNORMAL HIGH (ref 70–99)
Potassium: 4.4 mmol/L (ref 3.5–5.1)
Sodium: 155 mmol/L — ABNORMAL HIGH (ref 135–145)

## 2022-09-05 LAB — HEMOGLOBIN AND HEMATOCRIT, BLOOD
HCT: 21.5 % — ABNORMAL LOW (ref 39.0–52.0)
Hemoglobin: 6.3 g/dL — CL (ref 13.0–17.0)

## 2022-09-05 LAB — PREPARE RBC (CROSSMATCH)

## 2022-09-05 MED ORDER — NOREPINEPHRINE 4 MG/250ML-% IV SOLN: 0.0000 ug/min | INTRAVENOUS | Status: DC

## 2022-09-05 MED ORDER — NOREPINEPHRINE 4 MG/250ML-% IV SOLN
0.0000 ug/min | INTRAVENOUS | Status: DC
  Administered 2022-09-05: 10 ug/min via INTRAVENOUS
  Administered 2022-09-05: 2 ug/min via INTRAVENOUS
  Filled 2022-09-05: qty 250

## 2022-09-05 MED ORDER — SODIUM CHLORIDE 0.9 % IV BOLUS
2000.0000 mL | Freq: Once | INTRAVENOUS | Status: AC
  Administered 2022-09-05: 2000 mL via INTRAVENOUS

## 2022-09-05 MED ORDER — NOREPINEPHRINE 4 MG/250ML-% IV SOLN
INTRAVENOUS | Status: AC
  Administered 2022-09-05: 2 ug/min via INTRAVENOUS
  Filled 2022-09-05: qty 250

## 2022-09-05 MED ORDER — SODIUM CHLORIDE 0.9% IV SOLUTION: Freq: Once | INTRAVENOUS | Status: AC

## 2022-09-05 MED ORDER — INSULIN DETEMIR 100 UNIT/ML ~~LOC~~ SOLN
5.0000 [IU] | Freq: Two times a day (BID) | SUBCUTANEOUS | Status: DC
  Administered 2022-09-05 – 2022-09-10 (×10): 5 [IU] via SUBCUTANEOUS
  Filled 2022-09-05 (×15): qty 0.05

## 2022-09-05 MED ORDER — SODIUM CHLORIDE 0.9 % IV SOLN
250.0000 mL | INTRAVENOUS | Status: DC
  Administered 2022-09-05: 250 mL via INTRAVENOUS

## 2022-09-05 MED ORDER — IOHEXOL 350 MG/ML SOLN
75.0000 mL | Freq: Once | INTRAVENOUS | Status: AC | PRN
  Administered 2022-09-05: 75 mL via INTRAVENOUS

## 2022-09-05 MED ORDER — CHLORHEXIDINE GLUCONATE CLOTH 2 % EX PADS
6.0000 | MEDICATED_PAD | Freq: Every day | CUTANEOUS | Status: DC
  Administered 2022-09-05 – 2022-09-11 (×7): 6 via TOPICAL

## 2022-09-05 MED ORDER — PANTOPRAZOLE SODIUM 40 MG IV SOLR
40.0000 mg | Freq: Two times a day (BID) | INTRAVENOUS | Status: DC
  Administered 2022-09-05 – 2022-09-11 (×13): 40 mg via INTRAVENOUS
  Filled 2022-09-05 (×13): qty 10

## 2022-09-05 MED ORDER — NOREPINEPHRINE 4 MG/250ML-% IV SOLN
0.0000 ug/min | INTRAVENOUS | Status: DC
  Administered 2022-09-06: 19 ug/min via INTRAVENOUS
  Administered 2022-09-06: 16 ug/min via INTRAVENOUS
  Administered 2022-09-06: 6 ug/min via INTRAVENOUS
  Administered 2022-09-06: 13 ug/min via INTRAVENOUS
  Filled 2022-09-05 (×4): qty 250

## 2022-09-05 MED ORDER — SODIUM CHLORIDE 0.9 % IV BOLUS: 500.0000 mL | Freq: Once | INTRAVENOUS | Status: DC

## 2022-09-05 NOTE — Progress Notes (Signed)
SLP Cancellation Note  Patient Details Name: Randy [name] MRN: [id number] DOB: [birth year]   Cancelled treatment:       Reason Eval/Treat Not Completed: Medical issues which prohibited therapy. SLP spoke with MD and GI and Pt needs to remain NPO at this time for a procedure. ST will continue efforts. Thank you,  Tiphany Fayson H. Roddie Mc, CCC-SLP Speech Language Pathologist    Wende Bushy 09/05/2022, 9:28 AM

## 2022-09-05 NOTE — Progress Notes (Signed)
Dr Dyann Kief notified of patient rhythm Afib at 1045 due to change from NSR. Patient is on Levophed for BP control. MD notified that BP continues to decrease and a line is needed to help with this. Patient is also receiving fluid resuscitation and blood transfusions. Rectal temp currently 94.6. Dr Dyann Kief and C. Mahon NP also made aware of this as well. Heating k pad placed on patient as unable to locate bear hugger. MD and [facility] aware of this as well.

## 2022-09-05 NOTE — Progress Notes (Signed)
PICC order received. Spoke with Production assistant, radio, will not be able to place the PICC at this time since the order came in late in the day and no PICC RN tonight. Recommend to drop a central line for IV access.

## 2022-09-05 NOTE — Progress Notes (Signed)
Progress Note   Patient: Randy [name] BTD:[id number] DOB: 1941/02/01 DOA: 09/03/2022     2 DOS: the patient was seen and examined on 09/05/2022   Brief hospital course: Siyon Dusing is a 81 y.o. male with medical history significant of chronic systolic heart failure, hypertension, hyperlipidemia, coronary artery disease, GERD and BPH; who was brought to the hospital secondary to change in mentation and elevated blood sugar. Patient mentation not allowing for him to participate in interview; currently agitated confused and not following commands. CT head demonstrated no acute intracranial abnormalities; blood work suggesting the presence of hyperosmolar hyperglycemic crisis with blood sugar in the 970 range.  Normal bicarb and worsening renal function. No acute signs of infection appreciated.   IV fluids, insulin therapy initiated.  TRH contacted to place patient in the hospital for further evaluation and management.  Assessment and Plan: Acute metabolic encephalopathy -Multifactorial: In the setting of hypernatremia, hyperglycemic state, uremia/worsening renal function and dehydration -Continue IV fluids -Continue supportive care, -Continue constant reorientation and continue the use of as needed Haldol. -Sitter at bedside has also been re-ordered and continue.  * 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 ([place]) - CBGs in the 100-300 range -Currently unable to eat or drink spontaneously -Still with hypernatremia -also requiring to be NPO due to GIB -Continue D5 W and the use of SSI and low dose lantus -Continue to follow CBGs. -Overall hyperosmolar state corrected.  BPH (benign prostatic hyperplasia) -Holding Flomax and Proscar in the setting of low blood pressure -No signs of urinary retention currently. -Close monitoring for any symptoms.  GERD (gastroesophageal reflux disease) -Continue PPI. -Dose adjusted to twice a day and given through his  pains. -Due to acute GI bleed GI service has been consulted and will follow recommendations. -Planning for CT angiogram to try to identify if source/area of bleeding. -Follow hemoglobin trend and transfuse as needed. -No prior history of GI ulcers reported. -Heparin for DVT prophylaxis discontinued; SCDs in place -Holding aspirin and Plavix currently.  Chronic systolic CHF (congestive heart failure) (HCC) -Stable and currently compensated -Continue to follow daily weights and strict I's and O's -Continue judicious fluid resuscitation as patient presented with hyperosmolar hyperglycemic crisis and hyponatremia. -Continue holding diuretics initially given worsening renal function.  Mixed hyperlipidemia -Continue statin. -As long as patient is able to take p.o.'s.  Primary hypertension -Blood pressure soft/low and requiring pressor support -Holding antihypertensive agents at this point. -Appear to be secondary to GI bleed -Continue adequate fluid resuscitation and blood transfusions. -Follow-up vital signs.  CAD, multiple vessel -No chest pain or shortness of breath currently -Continue home medication once blood pressure stabilized and continue risk factor modification -Continue telemetry monitoring.  AKI-CKD 3B -In the setting of dehydration -Continue to hold nephrotoxic agents -Continue IV fluid resuscitation -Continue to follow renal function trend. -Creatinine down to 2.7 currently.  Hypertension; hypovolemic shock -In the setting of GI bleed -Continue fluid resuscitation -Follow hemoglobin trend and transfuse as needed -Follow GI service recommendation. -Patient at high risk for decompensation and further complications  Subjective:  Patient mentation now back to baseline; demonstrating overall improvement.  Overnight with significant episodes of bleeding that have led to dropping his hemoglobin level and making him hypotensive.  Patient requiring pressors.  Physical  Exam: Vitals:   09/05/22 1555 09/05/22 1600 09/05/22 1615 09/05/22 1630  BP: (!) 86/43 (!) 94/28 (!) 74/29 (!) 104/25  Pulse: 66 64 63 63  Resp: '16 15 10 11  '$ Temp: (!) 97.1 F (  36.2 C) 98.4 F (36.9 C)    TempSrc: Axillary Axillary    SpO2: 100% 100% 100% 100%  Weight:      Height:       General exam: Alert, awake, oriented x 2; was able to answer very simple questions (limited interaction due to language barrier without availability of interpreter), patient requested to call his son for any updates or communication.  Reports no chest pain, no nausea, no vomiting, no abdominal pain.  Overnight with multiple episodes of bright red blood per rectum and copious amount of bleeding.  Patient became hypotensive and is currently requiring pressors support. Respiratory system: Good air movement, good saturation on room air, not using accessory muscle. Cardiovascular system: Rate controlled, no rubs, no gallops, no JVD on exam. Gastrointestinal system: Abdomen is nondistended, soft and nontender. No organomegaly or masses felt. Normal bowel sounds heard. Central nervous system: Following minimal commands; no focal deficits appreciated. Extremities: No cyanosis or clubbing. Skin: No petechiae. Psychiatry: Judgement and insight appear still impaired and not back to baseline due to acute encephalopathic process.  Data Reviewed: CBGs in the 170-200 range. Basic metabolic panel: Sodium 960, potassium 4.4, chloride 122, bicarb 20, BUN 79, creatinine 2.78, GFR 22 and anion gap 13. CBC: White blood cells 12.7, hemoglobin 7.7 (with repeat level from H&H showing at 6.3 level); platelets count 350 K  Family Communication: No family at bedside.  Son updated over the phone. (I received verbal consent for any intervention and central line placement require to stabilize his diet or assist with diagnosis of acute condition).  Disposition: Status is: Inpatient Remains inpatient appropriate because: Still  requiring IV therapy; mentation now back to baseline.   Planned Discharge Destination: Skilled nursing facility long-term resident.   Author: Barton Dubois, MD 09/05/2022 4:58 PM  For on call review [email or web address].

## 2022-09-05 NOTE — TOC Initial Note (Signed)
Transition of Care [facility]) - Initial/Assessment Note    Patient Details  Name: Randy [name] MRN: [id number] Date of Birth: 06-02-1941  Transition of [facility]) CM/SW Contact:    Ihor Gully, LCSW Phone Number: 09/05/2022, 3:08 PM  Clinical Narrative:                 Patient is a long-term care resident at [facility], memory care unit. Prior to admission to the hospital, patient was ambulatory and required minimum to moderate assistance with ADLs. He can return to the facility at d/c.  Expected Discharge Plan: Skilled Nursing Facility Barriers to Discharge: Continued Medical Work up   Patient Goals and CMS Choice Patient states their goals for this hospitalization and ongoing recovery are:: patient is long term care at [facility]      Expected Discharge Plan and Services Expected Discharge Plan: [place]                                              Prior Living Arrangements/Services   Lives with:: Facility Resident Patient language and need for interpreter reviewed:: Yes        Need for Family Participation in Patient Care: Yes (Comment) Care giver support system in place?: Yes (comment)   Criminal Activity/Legal Involvement Pertinent to Current Situation/Hospitalization: No - Comment as needed  Activities of Daily Living Home Assistive Devices/Equipment: None ADL Screening (condition at time of admission) Patient's cognitive ability adequate to safely complete daily activities?: No Is the patient deaf or have difficulty hearing?: No Does the patient have difficulty seeing, even when wearing glasses/contacts?: No Does the patient have difficulty concentrating, remembering, or making decisions?: Yes Patient able to express need for assistance with ADLs?: No Does the patient have difficulty dressing or bathing?: Yes Independently performs ADLs?: No Communication: Needs assistance Is this a change  from baseline?: Pre-admission baseline Dressing (OT): Needs assistance Is this a change from baseline?: Pre-admission baseline Grooming: Needs assistance Is this a change from baseline?: Pre-admission baseline Feeding: Independent Bathing: Needs assistance Is this a change from baseline?: Pre-admission baseline Toileting: Needs assistance Is this a change from baseline?: Pre-admission baseline In/Out Bed: Needs assistance Is this a change from baseline?: Pre-admission baseline Walks in Home: Needs assistance Is this a change from baseline?: Pre-admission baseline Does the patient have difficulty walking or climbing stairs?: Yes Weakness of Legs: None Weakness of Arms/Hands: None  Permission Sought/Granted Permission sought to share information with : Facility Sport and exercise psychologist    Share Information with NAME: Suezanne Cheshire, facility social worker           Emotional Assessment         Alcohol / Substance Use: Not Applicable Psych Involvement: No (comment)  Admission diagnosis:  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 ([place]) [E11.00] Patient Active Problem List   Diagnosis Date Noted   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 ([place]) 09/03/2022   GERD (gastroesophageal reflux disease) 09/03/2022   BPH (benign prostatic hyperplasia) 09/03/2022   Malnutrition of moderate degree 01/02/2022   Heart failure ([place]) 61/60/7371   Chronic systolic CHF (congestive heart failure) ([place]) 12/31/2021   Diarrhea 12/31/2021   Ischemic cardiomyopathy 12/14/2021   CHF exacerbation ([place]) 12/12/2021   SOB (shortness of breath) 11/22/2021   Elevated troponin 11/22/2021   Hyperglycemia 11/22/2021  Blood creatinine increased compared with prior measurement 11/22/2021   Acute respiratory failure with hypoxia and hypercapnia ([place]) 11/21/2021   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   Urinary tract infection 11/16/2021    Microcytic anemia 11/16/2021   Thrombocytosis 11/16/2021   Bradycardia 07/05/2021   Hematuria 07/05/2021   Anemia 07/05/2021   Acute on chronic systolic CHF (congestive heart failure) ([place]) 07/01/2021   CAD, 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   Stage 3b chronic kidney disease ([place])    Primary hypertension    Mixed hyperlipidemia    AKI-CKD 3B    NSTEMI (non-ST elevated myocardial infarction) ([place])    Hypertensive crisis 01/29/2021   Acute respiratory failure with hypoxia ([place]) 01/29/2021   PCP:  Jilda Panda, MD Pharmacy:   [facility] [place], [address] AT [address] [place] [address] Phone: [phone] Fax: [phone]  [facility] [address] [place] [address] Phone: [phone] Fax: [phone]     Social Determinants of Health (SDOH) Interventions Housing Interventions: Intervention Not Indicated  Readmission Risk Interventions    01/02/2022   10:42 AM 12/13/2021   11:49 AM  Readmission Risk Prevention Plan  Transportation Screening Complete Complete  Medication Review ([place]) Complete Complete  PCP or Specialist appointment within 3-5 days of discharge Complete Complete  HRI or Home Care Consult Complete Complete  SW Recovery Care/Counseling Consult Complete Complete  Palliative Care Screening Not Applicable Not [place] Not Applicable Not Applicable

## 2022-09-05 NOTE — Progress Notes (Signed)
Dr Okey Dupre completed  central line placement at 1800. RN at bedside to assist as needed. Time out with MD and RN prior to start of CVC insertion. Triple Lumen IJ placement confirmed with chest xray. Per Dr. Okey Dupre, line okay to use. Levophed moved to central line. Patient transported to CTA at 1820. Inofrmed consent was obtained with Dr. Dyann Kief via telephone. Consent given by son-Randy [name] as patient is confused. RN witnessed with MD. Patient returned to room at [place] and MD ordered stat CBC. Per MD CTA negative and RN to continue supportive therapies.

## 2022-09-05 NOTE — Procedures (Signed)
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  He requires central venous catheter placement for IV access and need for vasopressor medications. I discussed the risk and benefits of placement of the central line with the patient's son, Ikenna Ohms, including but not limited to bleeding, infection, injury to surrounding structures, and risk of pneumothorax. He has given verbal consent for the procedure.    Procedure: The patient placed supine. Time out was performed.  The right chest and neck was prepped and draped in the usual sterile fashion.  Wearing full gown and gloves, I performed the procedure.  One percent lidocaine was used for local anesthesia. An ultrasound was utilized to assess the jugular vein.  The needle with syringe was advanced into the jugular vein with dark venous return, and a wire was placed using the Seldinger technique without difficulty.  Ectopia was not noted.  The skin was knicked and a dilator was placed, and the three lumen catheter was placed over the wire with continued control of the wire.  There was good draw back of blood from all three lumens and each flushed easily with saline.  The catheter was secured in 3 points with 2-0 silk and a biopatch, and dressing was placed.     The patient tolerated the procedure well, and the CXR was ordered to confirm position of the central line.   Graciella Freer, DO [facility] [address] [place], [place] [address] [phone] (office)

## 2022-09-05 NOTE — Consult Note (Signed)
[facility] Consult  Reason for Consult:Central line placement Referring Physician: Dr. Dyann Kief  Chief Complaint   Altered Mental Status     HPI: Randy [name] is a 81 y.o. male who was admitted with hyperosmolar nonketotic state secondary to type 2 diabetes.  He subsequently developed an acute GI bleed.  He is currently receiving his second unit of blood and is on 10 of Levophed.  He requires central venous access for CTA of the abdomen and pelvis to evaluate for location of bleed.  His past medical history significant for CHF, coronary artery disease, and hypertension.  Patient is altered and does not speak [place].  No history was able to be obtained from him.  History was obtained from nursing staff EMR.  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   Hearing loss    HTN (hypertension)     Past Surgical History:  Procedure Laterality Date   ESOPHAGOGASTRODUODENOSCOPY (EGD) WITH PROPOFOL N/A 11/26/2021   Procedure: ESOPHAGOGASTRODUODENOSCOPY (EGD) WITH PROPOFOL;  Surgeon: Doran Stabler, MD;  Location: [place];  Service: Gastroenterology;  Laterality: N/A;   EYE SURGERY     IABP INSERTION N/A 01/30/2021   Procedure: IABP Insertion;  Surgeon: Leonie Man, MD;  Location: [place] [facility];  Service: Cardiovascular;  Laterality: N/A;   RIGHT/LEFT HEART CATH AND CORONARY ANGIOGRAPHY N/A 01/30/2021   Procedure: RIGHT/LEFT HEART CATH AND CORONARY ANGIOGRAPHY;  Surgeon: Leonie Man, MD;  Location: [place] [facility];  Service: Cardiovascular;  Laterality: N/A;    History reviewed. No pertinent family history.  Social History   Tobacco Use   Smoking status: Never   Smokeless tobacco: Never  Substance Use Topics   Alcohol use: Never   Drug use: Never    Medications: I have reviewed the patient's current medications.  No Known Allergies   ROS:   Unable to obtain secondary to patient mentation.  Blood pressure (!) 104/25, pulse 63, temperature 98.4 F (36.9 C), temperature source Axillary, resp. rate 11, height '5\' 2"'$  (1.575 m), weight 47.6 kg, SpO2 100 %. Physical Exam Vitals reviewed.  Constitutional:      General: He is not in acute distress.    Appearance: He is ill-appearing.  HENT:     Head: Normocephalic and atraumatic.  Cardiovascular:     Rate and Rhythm: Normal rate.  Pulmonary:     Effort: Pulmonary effort is normal.  Abdominal:     General: There is no distension.     Palpations: Abdomen is soft.     Tenderness: There is no abdominal tenderness.  Musculoskeletal:     Cervical back: No rigidity or tenderness.  Lymphadenopathy:     Cervical: No cervical adenopathy.  Skin:    General: Skin is warm and dry.  Neurological:     Mental Status: He is disoriented.     Results: Results for orders placed or performed during the hospital encounter of 09/03/22 (from the past 48 hour(s))  Glucose, capillary     Status: Abnormal   Collection Time: 09/03/22  6:05 PM  Result Value Ref Range   Glucose-Capillary >600 (HH) 70 - 99 mg/dL    Comment: Glucose reference range applies only to samples taken after fasting for at least 8 hours.  Glucose, capillary     Status: Abnormal   Collection Time: 09/03/22  6:27 PM  Result Value Ref Range   Glucose-Capillary 583 (  HH) 70 - 99 mg/dL    Comment: Glucose reference range applies only to samples taken after fasting for at least 8 hours.   Comment 1 Notify RN   Urinalysis, Routine w reflex microscopic     Status: Abnormal   Collection Time: 09/03/22  6:45 PM  Result Value Ref Range   Color, Urine YELLOW YELLOW   APPearance HAZY (A) CLEAR   Specific Gravity, Urine 1.017 1.005 - 1.030   pH 5.0 5.0 - 8.0   Glucose, UA >=500 (A) NEGATIVE mg/dL   Hgb urine dipstick NEGATIVE NEGATIVE   Bilirubin Urine NEGATIVE NEGATIVE   Ketones, ur NEGATIVE NEGATIVE mg/dL   Protein, ur  NEGATIVE NEGATIVE mg/dL   Nitrite NEGATIVE NEGATIVE   Leukocytes,Ua TRACE (A) NEGATIVE   RBC / HPF 0-5 0 - 5 RBC/hpf   WBC, UA 0-5 0 - 5 WBC/hpf   Bacteria, UA MANY (A) NONE SEEN   Squamous Epithelial / LPF 0-5 0 - 5   Mucus PRESENT    Hyaline Casts, UA PRESENT     Comment: Performed at [facility], [address] [place], [place] [address]  Basic metabolic panel     Status: Abnormal   Collection Time: 09/03/22  6:53 PM  Result Value Ref Range   Sodium 156 (H) 135 - 145 mmol/L   Potassium 3.7 3.5 - 5.1 mmol/L    Comment: DELTA CHECK NOTED   Chloride 114 (H) 98 - 111 mmol/L   CO2 24 22 - 32 mmol/L   Glucose, Bld 521 (HH) 70 - 99 mg/dL    Comment: Glucose reference range applies only to samples taken after fasting for at least 8 hours. CRITICAL RESULT CALLED TO, READ BACK BY AND VERIFIED WITH: TINAJERO,E ON 09/03/22 AT 1945 BY LOY,C    BUN 105 (H) 8 - 23 mg/dL    Comment: RESULTS CONFIRMED BY MANUAL DILUTION   Creatinine, Ser 3.87 (H) 0.61 - 1.24 mg/dL   Calcium 9.0 8.9 - 10.3 mg/dL   GFR, Estimated 15 (L) >60 mL/min    Comment: (NOTE) Calculated using the CKD-EPI Creatinine Equation (2021)    Anion gap 18 (H) 5 - 15    Comment: Performed at [facility], [address] [place], [place] [address]  Glucose, capillary     Status: Abnormal   Collection Time: 09/03/22  7:02 PM  Result Value Ref Range   Glucose-Capillary 516 (HH) 70 - 99 mg/dL    Comment: Glucose reference range applies only to samples taken after fasting for at least 8 hours.   Comment 1 Document in Chart   Glucose, capillary     Status: Abnormal   Collection Time: 09/03/22  7:35 PM  Result Value Ref Range   Glucose-Capillary 324 (H) 70 - 99 mg/dL    Comment: Glucose reference range applies only to samples taken after fasting for at least 8 hours.  Glucose, capillary     Status: Abnormal   Collection Time: 09/03/22  8:22 PM  Result Value Ref Range   Glucose-Capillary 273 (H) 70 - 99 mg/dL    Comment: Glucose  reference range applies only to samples taken after fasting for at least 8 hours.  Glucose, capillary     Status: Abnormal   Collection Time: 09/03/22  9:27 PM  Result Value Ref Range   Glucose-Capillary 193 (H) 70 - 99 mg/dL    Comment: Glucose reference range applies only to samples taken after fasting for at least 8 hours.  Basic metabolic panel  Status: Abnormal   Collection Time: 09/03/22 10:08 PM  Result Value Ref Range   Sodium 159 (H) 135 - 145 mmol/L   Potassium 3.9 3.5 - 5.1 mmol/L   Chloride 119 (H) 98 - 111 mmol/L   CO2 24 22 - 32 mmol/L   Glucose, Bld 193 (H) 70 - 99 mg/dL    Comment: Glucose reference range applies only to samples taken after fasting for at least 8 hours.   BUN 102 (H) 8 - 23 mg/dL    Comment: RESULTS CONFIRMED BY MANUAL DILUTION   Creatinine, Ser 3.57 (H) 0.61 - 1.24 mg/dL   Calcium 9.3 8.9 - 10.3 mg/dL   GFR, Estimated 16 (L) >60 mL/min    Comment: (NOTE) Calculated using the CKD-EPI Creatinine Equation (2021)    Anion gap 16 (H) 5 - 15    Comment: Performed at [facility], [address], [place], [place] [address]  Glucose, capillary     Status: Abnormal   Collection Time: 09/03/22 10:25 PM  Result Value Ref Range   Glucose-Capillary 164 (H) 70 - 99 mg/dL    Comment: Glucose reference range applies only to samples taken after fasting for at least 8 hours.  Glucose, capillary     Status: Abnormal   Collection Time: 09/03/22 11:27 PM  Result Value Ref Range   Glucose-Capillary 188 (H) 70 - 99 mg/dL    Comment: Glucose reference range applies only to samples taken after fasting for at least 8 hours.  Glucose, capillary     Status: Abnormal   Collection Time: 09/04/22 12:31 AM  Result Value Ref Range   Glucose-Capillary 209 (H) 70 - 99 mg/dL    Comment: Glucose reference range applies only to samples taken after fasting for at least 8 hours.  Glucose, capillary     Status: Abnormal   Collection Time: 09/04/22  2:28 AM  Result Value Ref Range    Glucose-Capillary 157 (H) 70 - 99 mg/dL    Comment: Glucose reference range applies only to samples taken after fasting for at least 8 hours.  Glucose, capillary     Status: Abnormal   Collection Time: 09/04/22  3:28 AM  Result Value Ref Range   Glucose-Capillary 128 (H) 70 - 99 mg/dL    Comment: Glucose reference range applies only to samples taken after fasting for at least 8 hours.  Basic metabolic panel     Status: Abnormal   Collection Time: 09/04/22  3:54 AM  Result Value Ref Range   Sodium 158 (H) 135 - 145 mmol/L   Potassium 3.7 3.5 - 5.1 mmol/L   Chloride 122 (H) 98 - 111 mmol/L   CO2 26 22 - 32 mmol/L   Glucose, Bld 145 (H) 70 - 99 mg/dL    Comment: Glucose reference range applies only to samples taken after fasting for at least 8 hours.   BUN 97 (H) 8 - 23 mg/dL   Creatinine, Ser 3.20 (H) 0.61 - 1.24 mg/dL   Calcium 9.0 8.9 - 10.3 mg/dL   GFR, Estimated 18 (L) >60 mL/min    Comment: (NOTE) Calculated using the CKD-EPI Creatinine Equation (2021)    Anion gap 10 5 - 15    Comment: Performed at [facility], [address], [place], [place] [address]  Glucose, capillary     Status: Abnormal   Collection Time: 09/04/22  4:26 AM  Result Value Ref Range   Glucose-Capillary 166 (H) 70 - 99 mg/dL    Comment: Glucose reference  range applies only to samples taken after fasting for at least 8 hours.  Glucose, capillary     Status: Abnormal   Collection Time: 09/04/22  5:33 AM  Result Value Ref Range   Glucose-Capillary 179 (H) 70 - 99 mg/dL    Comment: Glucose reference range applies only to samples taken after fasting for at least 8 hours.  Glucose, capillary     Status: Abnormal   Collection Time: 09/04/22  6:25 AM  Result Value Ref Range   Glucose-Capillary 192 (H) 70 - 99 mg/dL    Comment: Glucose reference range applies only to samples taken after fasting for at least 8 hours.  Basic metabolic panel     Status: Abnormal   Collection Time: 09/04/22  6:52 AM  Result  Value Ref Range   Sodium 160 (H) 135 - 145 mmol/L   Potassium 4.1 3.5 - 5.1 mmol/L   Chloride 122 (H) 98 - 111 mmol/L   CO2 27 22 - 32 mmol/L   Glucose, Bld 242 (H) 70 - 99 mg/dL    Comment: Glucose reference range applies only to samples taken after fasting for at least 8 hours.   BUN 96 (H) 8 - 23 mg/dL   Creatinine, Ser 3.19 (H) 0.61 - 1.24 mg/dL   Calcium 8.9 8.9 - 10.3 mg/dL   GFR, Estimated 19 (L) >60 mL/min    Comment: (NOTE) Calculated using the CKD-EPI Creatinine Equation (2021)    Anion gap 11 5 - 15    Comment: Performed at [facility], [address] [place], [place] [address]  Glucose, capillary     Status: Abnormal   Collection Time: 09/04/22  7:30 AM  Result Value Ref Range   Glucose-Capillary 219 (H) 70 - 99 mg/dL    Comment: Glucose reference range applies only to samples taken after fasting for at least 8 hours.  Glucose, capillary     Status: Abnormal   Collection Time: 09/04/22  9:00 AM  Result Value Ref Range   Glucose-Capillary 178 (H) 70 - 99 mg/dL    Comment: Glucose reference range applies only to samples taken after fasting for at least 8 hours.  Glucose, capillary     Status: Abnormal   Collection Time: 09/04/22 10:00 AM  Result Value Ref Range   Glucose-Capillary 190 (H) 70 - 99 mg/dL    Comment: Glucose reference range applies only to samples taken after fasting for at least 8 hours.  Glucose, capillary     Status: Abnormal   Collection Time: 09/04/22 11:02 AM  Result Value Ref Range   Glucose-Capillary 192 (H) 70 - 99 mg/dL    Comment: Glucose reference range applies only to samples taken after fasting for at least 8 hours.  Glucose, capillary     Status: Abnormal   Collection Time: 09/04/22 12:10 PM  Result Value Ref Range   Glucose-Capillary 178 (H) 70 - 99 mg/dL    Comment: Glucose reference range applies only to samples taken after fasting for at least 8 hours.  Glucose, capillary     Status: Abnormal   Collection Time: 09/04/22 12:58 PM   Result Value Ref Range   Glucose-Capillary 180 (H) 70 - 99 mg/dL    Comment: Glucose reference range applies only to samples taken after fasting for at least 8 hours.  Glucose, capillary     Status: Abnormal   Collection Time: 09/04/22  2:05 PM  Result Value Ref Range   Glucose-Capillary 167 (H) 70 - 99 mg/dL  Comment: Glucose reference range applies only to samples taken after fasting for at least 8 hours.  Glucose, capillary     Status: None   Collection Time: 09/04/22  3:27 PM  Result Value Ref Range   Glucose-Capillary 99 70 - 99 mg/dL    Comment: Glucose reference range applies only to samples taken after fasting for at least 8 hours.  Glucose, capillary     Status: Abnormal   Collection Time: 09/04/22  4:46 PM  Result Value Ref Range   Glucose-Capillary 114 (H) 70 - 99 mg/dL    Comment: Glucose reference range applies only to samples taken after fasting for at least 8 hours.  Glucose, capillary     Status: Abnormal   Collection Time: 09/04/22  5:23 PM  Result Value Ref Range   Glucose-Capillary 139 (H) 70 - 99 mg/dL    Comment: Glucose reference range applies only to samples taken after fasting for at least 8 hours.  Glucose, capillary     Status: Abnormal   Collection Time: 09/04/22  6:22 PM  Result Value Ref Range   Glucose-Capillary 193 (H) 70 - 99 mg/dL    Comment: Glucose reference range applies only to samples taken after fasting for at least 8 hours.  Glucose, capillary     Status: Abnormal   Collection Time: 09/04/22  7:33 PM  Result Value Ref Range   Glucose-Capillary 159 (H) 70 - 99 mg/dL    Comment: Glucose reference range applies only to samples taken after fasting for at least 8 hours.  Glucose, capillary     Status: Abnormal   Collection Time: 09/04/22  8:47 PM  Result Value Ref Range   Glucose-Capillary 134 (H) 70 - 99 mg/dL    Comment: Glucose reference range applies only to samples taken after fasting for at least 8 hours.  CBC     Status: Abnormal    Collection Time: 09/04/22  9:20 PM  Result Value Ref Range   WBC 17.7 (H) 4.0 - 10.5 K/uL   RBC 3.75 (L) 4.22 - 5.81 MIL/uL   Hemoglobin 10.2 (L) 13.0 - 17.0 g/dL   HCT 33.6 (L) 39.0 - 52.0 %   MCV 89.6 80.0 - 100.0 fL   MCH 27.2 26.0 - 34.0 pg   MCHC 30.4 30.0 - 36.0 g/dL   RDW 15.4 11.5 - 15.5 %   Platelets 431 (H) 150 - 400 K/uL   nRBC 0.3 (H) 0.0 - 0.2 %    Comment: Performed at [facility], [address] [place], [place] [address]  Type and screen [facility]     Status: None (Preliminary result)   Collection Time: 09/04/22  9:20 PM  Result Value Ref Range   ABO/RH(D) B POS    Antibody Screen NEG    Sample Expiration 09/07/2022,2359    Unit Number [id number]    Blood Component Type RED CELLS,LR    Unit division 00    Status of Unit ISSUED    Transfusion Status OK TO TRANSFUSE    Crossmatch Result      Compatible Performed at [facility], [address] [place], [place] [address]    Unit Number [id number]    Blood Component Type RED CELLS,LR    Unit division 00    Status of Unit ISSUED    Transfusion Status OK TO TRANSFUSE    Crossmatch Result Compatible   Glucose, capillary     Status: Abnormal   Collection Time: 09/04/22  9:43 PM  Result Value Ref Range   Glucose-Capillary 176 (H) 70 - 99 mg/dL    Comment: Glucose reference range applies only to samples taken after fasting for at least 8 hours.  Glucose, capillary     Status: Abnormal   Collection Time: 09/04/22 10:40 PM  Result Value Ref Range   Glucose-Capillary 192 (H) 70 - 99 mg/dL    Comment: Glucose reference range applies only to samples taken after fasting for at least 8 hours.  Glucose, capillary     Status: Abnormal   Collection Time: 09/05/22 12:10 AM  Result Value Ref Range   Glucose-Capillary 175 (H) 70 - 99 mg/dL    Comment: Glucose reference range applies only to samples taken after fasting for at least 8 hours.  Glucose, capillary     Status: Abnormal   Collection Time: 09/05/22   2:47 AM  Result Value Ref Range   Glucose-Capillary 233 (H) 70 - 99 mg/dL    Comment: Glucose reference range applies only to samples taken after fasting for at least 8 hours.  Glucose, capillary     Status: Abnormal   Collection Time: 09/05/22  4:20 AM  Result Value Ref Range   Glucose-Capillary 235 (H) 70 - 99 mg/dL    Comment: Glucose reference range applies only to samples taken after fasting for at least 8 hours.  Basic metabolic panel     Status: Abnormal   Collection Time: 09/05/22  5:54 AM  Result Value Ref Range   Sodium 155 (H) 135 - 145 mmol/L   Potassium 4.4 3.5 - 5.1 mmol/L   Chloride 122 (H) 98 - 111 mmol/L   CO2 20 (L) 22 - 32 mmol/L   Glucose, Bld 316 (H) 70 - 99 mg/dL    Comment: Glucose reference range applies only to samples taken after fasting for at least 8 hours.   BUN 79 (H) 8 - 23 mg/dL   Creatinine, Ser 2.78 (H) 0.61 - 1.24 mg/dL   Calcium 8.2 (L) 8.9 - 10.3 mg/dL   GFR, Estimated 22 (L) >60 mL/min    Comment: (NOTE) Calculated using the CKD-EPI Creatinine Equation (2021)    Anion gap 13 5 - 15    Comment: Performed at [facility], [address], [place], [place] [address]  CBC     Status: Abnormal   Collection Time: 09/05/22  5:54 AM  Result Value Ref Range   WBC 12.7 (H) 4.0 - 10.5 K/uL   RBC 2.87 (L) 4.22 - 5.81 MIL/uL   Hemoglobin 7.7 (L) 13.0 - 17.0 g/dL   HCT 26.9 (L) 39.0 - 52.0 %   MCV 93.7 80.0 - 100.0 fL   MCH 26.8 26.0 - 34.0 pg   MCHC 28.6 (L) 30.0 - 36.0 g/dL   RDW 15.4 11.5 - 15.5 %   Platelets 350 150 - 400 K/uL   nRBC 0.9 (H) 0.0 - 0.2 %    Comment: Performed at [facility], [address] [place], [place] [address]  Prepare RBC (crossmatch)     Status: None   Collection Time: 09/05/22  6:50 AM  Result Value Ref Range   Order Confirmation      ORDER PROCESSED BY BLOOD BANK Performed at [facility], [address], [place], [place] [address]   Glucose, capillary     Status: Abnormal   Collection Time: 09/05/22  8:14 AM   Result Value Ref Range   Glucose-Capillary 308 (H) 70 - 99 mg/dL    Comment: Glucose reference range  applies only to samples taken after fasting for at least 8 hours.  Hemoglobin and hematocrit, blood     Status: Abnormal   Collection Time: 09/05/22  9:48 AM  Result Value Ref Range   Hemoglobin 6.3 (LL) 13.0 - 17.0 g/dL    Comment: REPEATED TO VERIFY THIS CRITICAL RESULT HAS VERIFIED AND BEEN CALLED TO KING J. BY SUZANNE THOMPSON ON 09 15 2023 AT 1015, AND HAS BEEN READ BACK.     HCT 21.5 (L) 39.0 - 52.0 %    Comment: Performed at [facility], [address] [place], [place] [address]  Glucose, capillary     Status: Abnormal   Collection Time: 09/05/22 12:21 PM  Result Value Ref Range   Glucose-Capillary 198 (H) 70 - 99 mg/dL    Comment: Glucose reference range applies only to samples taken after fasting for at least 8 hours.   Comment 1 Notify RN    Comment 2 Document in Chart   Glucose, capillary     Status: Abnormal   Collection Time: 09/05/22  3:49 PM  Result Value Ref Range   Glucose-Capillary 102 (H) 70 - 99 mg/dL    Comment: Glucose reference range applies only to samples taken after fasting for at least 8 hours.    [place] EKG SITE RITE  Result Date: 09/05/2022 If Site Rite image not attached, placement could not be confirmed due to current cardiac rhythm.    Assessment & Plan:  Randy [name] is a 81 y.o. male who was admitted with hyperosmolar nonketotic state secondary to diabetes.  He subsequently developed an acute GI bleed.  General surgery was consulted for central venous catheter placement.  -Patient is altered and unable to answer questions.  He also does not speak [place], and we do not have the appropriate translators to assist in conversation -The risk and benefits of right IJ central venous catheter were discussed including but not limited to bleeding, infection, injury to surrounding structures, pneumothorax.  After careful consideration, Randy [name], Randy [name], has decided to proceed with central line placement.  -Right IJ central line was placed with ultrasound guidance without significant difficulty, please refer to separate dictation -Chest x-ray demonstrates appropriate position without evidence of pneumothorax -Ok to use for CTA, medications and blood products  All questions were answered to the satisfaction of the family.  -- Graciella Freer, DO [facility] [address] [place], [place] [address] [phone] (office)

## 2022-09-05 NOTE — Inpatient Diabetes Management (Signed)
Inpatient Diabetes Program Recommendations  AACE/ADA: New Consensus Statement on Inpatient Glycemic Control   Target Ranges:  Prepandial:   less than 140 mg/dL      Peak postprandial:   less than 180 mg/dL (1-2 hours)      Critically ill patients:  140 - 180 mg/dL    Latest Reference Range & Units 09/05/22 00:10 09/05/22 02:47 09/05/22 04:20  Glucose-Capillary 70 - 99 mg/dL 175 (H) 233 (H) 235 (H)    Latest Reference Range & Units 09/04/22 16:46 09/04/22 17:23 09/04/22 18:22 09/04/22 19:33 09/04/22 20:47 09/04/22 21:43 09/04/22 22:40  Glucose-Capillary 70 - 99 mg/dL 114 (H) 139 (H) 193 (H) 159 (H)  Levemir 5 units '@19'$ :49 134 (H) 176 (H)   Insulin drip stopped '@21'$ :39 192 (H)   Review of Glycemic Control  Diabetes history: DM2 Outpatient Diabetes medications: Jardiance 10 mg daily, Metformin 500 mg BID Current orders for Inpatient glycemic control: Levemir 5 units QHS, Novolog 0-15 units TID with meals, Novolog 0-5 units QHS  Inpatient Diabetes Program Recommendations:    Insulin: Patient received Levemir 5 units at 19:49 on 09/04/22 and no Novolog given since IV insulin stopped.  Please consider increasing Levemir to 5 units BID.   Thanks, Barnie Alderman, RN, MSN, [place] Diabetes Coordinator Inpatient Diabetes Program [phone] (Team Pager from 8am to [place])

## 2022-09-05 NOTE — NC FL2 (Signed)
[place] MEDICAID FL2 LEVEL OF CARE SCREENING TOOL     IDENTIFICATION  Patient Name: Randy [name] Birthdate: 10/09/1941 Sex: male Admission Date (Current Location): 09/03/2022  [facility] and [place] Number:  [employer] and Address:  [facility] [address] [place]      Provider Number: [id number]  Attending Physician Name and Address:  Barton Dubois, MD  Relative Name and Phone Number:  Froilan, Mclean)   [phone]    Current Level of Care: Hospital Recommended Level of Care: [facility] Prior Approval Number:    Date Approved/Denied:   PASRR Number:    Discharge Plan: SNF    Current Diagnoses: Patient Active Problem List   Diagnosis Date Noted   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 ([place]) 09/03/2022   GERD (gastroesophageal reflux disease) 09/03/2022   BPH (benign prostatic hyperplasia) 09/03/2022   Malnutrition of moderate degree 01/02/2022   Heart failure ([place]) 62/83/1517   Chronic systolic CHF (congestive heart failure) ([place]) 12/31/2021   Diarrhea 12/31/2021   Ischemic cardiomyopathy 12/14/2021   CHF exacerbation ([place]) 12/12/2021   SOB (shortness of breath) 11/22/2021   Elevated troponin 11/22/2021   Hyperglycemia 11/22/2021   Blood creatinine increased compared with prior measurement 11/22/2021   Acute respiratory failure with hypoxia and hypercapnia (HCC) 11/21/2021   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   Urinary tract infection 11/16/2021   Microcytic anemia 11/16/2021   Thrombocytosis 11/16/2021   Bradycardia 07/05/2021   Hematuria 07/05/2021   Anemia 07/05/2021   Acute on chronic systolic CHF (congestive heart failure) ([place]) 07/01/2021   CAD, 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   Stage 3b chronic kidney disease ([place])    Primary hypertension    Mixed hyperlipidemia    AKI-CKD 3B    NSTEMI (non-ST elevated myocardial infarction)  ([place])    Hypertensive crisis 01/29/2021   Acute respiratory failure with hypoxia ([place]) 01/29/2021    Orientation RESPIRATION BLADDER Height & Weight        Normal Incontinent Weight: 104 lb 15 oz (47.6 kg) Height:  '5\' 2"'$  (157.5 cm)  BEHAVIORAL SYMPTOMS/MOOD NEUROLOGICAL BOWEL NUTRITION STATUS      Incontinent Diet (see d/c summary)  AMBULATORY STATUS COMMUNICATION OF NEEDS Skin   Limited Assist Verbally Normal                       Personal Care Assistance Level of Assistance  Bathing, Feeding, Dressing Bathing Assistance: Limited assistance Feeding assistance: Limited assistance Dressing Assistance: Limited assistance     Functional Limitations Info  Sight, Hearing, Speech Sight Info: Adequate Hearing Info: Adequate Speech Info: Adequate (limited English, speaks [place])    SPECIAL CARE FACTORS FREQUENCY                       Contractures      Additional Factors Info  Code Status, Allergies, Psychotropic Code Status Info: DNR Allergies Info: NKA Psychotropic Info: n/a         Current Medications (09/05/2022):  This is the current hospital active medication list Current Facility-Administered Medications  Medication Dose Route Frequency Provider Last Rate Last Admin   0.9 %  sodium chloride infusion  250 mL Intravenous Continuous Zierle-Ghosh, Asia B, DO 10 mL/hr at 09/05/22 1050 250 mL at 09/05/22 1050   atorvastatin (LIPITOR) tablet 80 mg  80 mg Oral Daily Barton Dubois, MD       Chlorhexidine Gluconate  Cloth 2 % PADS 6 each  6 each Topical Daily Barton Dubois, MD   6 each at 09/05/22 0925   clopidogrel (PLAVIX) tablet 75 mg  75 mg Oral Daily Barton Dubois, MD   75 mg at 09/04/22 0921   dextrose 5 % solution   Intravenous Continuous Barton Dubois, MD 100 mL/hr at 09/04/22 2337 Infusion Verify at 09/04/22 2337   haloperidol lactate (HALDOL) injection 1 mg  1 mg Intravenous Q8H PRN Barton Dubois, MD   1 mg at 09/04/22 2147   hydrocortisone  (ANUSOL-HC) suppository 25 mg  25 mg Rectal BID Barton Dubois, MD       insulin aspart (novoLOG) injection 0-15 Units  0-15 Units Subcutaneous TID WC Zierle-Ghosh, Asia B, DO   3 Units at 09/05/22 1244   insulin aspart (novoLOG) injection 0-5 Units  0-5 Units Subcutaneous QHS Zierle-Ghosh, Asia B, DO       insulin detemir (LEVEMIR) injection 5 Units  5 Units Subcutaneous BID Barton Dubois, MD   5 Units at 09/05/22 1244   norepinephrine (LEVOPHED) '4mg'$  in 226m (0.016 mg/mL) premix infusion  2-10 mcg/min Intravenous Titrated Zierle-Ghosh, Asia B, DO 7.5 mL/hr at 09/05/22 0822 2 mcg/min at 09/05/22 00109  pantoprazole (PROTONIX) injection 40 mg  40 mg Intravenous Q12H Zierle-Ghosh, Asia B, DO   40 mg at 09/05/22 03235    Discharge Medications: Please see discharge summary for a list of discharge medications.  Relevant Imaging Results:  Relevant Lab Results:   Additional Information SSN#: [id number] SIhor Gully LCSW

## 2022-09-05 NOTE — Care Management Important Message (Signed)
Important Message  Patient Details  Name: Pacer Dorn MRN: [id number] Date of Birth: 12/28/1940   Medicare Important Message Given:  No (unable to reach son Javell Blackburn at [phone])     Tommy Medal 09/05/2022, 4:44 PM

## 2022-09-05 NOTE — Consult Note (Signed)
Gastroenterology Consult   Referring Provider: No ref. provider found Primary Care Physician:  Jilda Panda, MD Primary Gastroenterologist:  Dr. Loletha Carrow (previously)  Patient ID: Randy [name]; [id number]; [birth year]   Admit date: 09/03/2022  LOS: 2 days   Date of Consultation: 09/05/2022  Reason for Consultation:  lower GI bleed (BRBPR), symptomatic anemia  History of Present Illness   Randy [name] is a 81 y.o. year old male with history of chronic systolic heart failure, CKD stage 3, HTN, HLD, CAD, GERD, and BPH who presented to the ED due to change in mentation and elevated blood sugar.   ED Course: CT head with no acute intracranial abnormalities Blood sugars in 970 range (evidence of hyperosmolar hyperglycemic crisis?) Hgb 12.9, platelets 572, sodium 149, Cr 3.87 (baseline 1.4-2), INR 1.4, lactate 3, normal bicarb; negative UA Given IV fluids and insulin.   Per review of chart it appears 09/04/22 patient was agitated and was given haldol and was disimpacted producing large soft green stool then there were reports of bleeding with clots. It also appears the patient was not follow commands and was having difficulty swallowing medications.   Consult:  Last EGD December 2022 due to reported black stools: Normal exam. No source of GI bleeding identified.  Overnight there were reports of diarrhea and blood in the stool. Team was contacted this morning by overnight hospitalist about patient's large bloody bowel movements and drop in hgb from 12.9 to 7.7 this morning. They were preparing to transfuse and IV fluids were ordered and initiation of pressors for manual BP 64/29. Initially 36cc ordered - Dr. Jenetta Downer recommended pressors, 2L IV fluids, and STAT CTA.   History has been difficult to obtain due to patients mental status. Appears the patient speaks [place] and there was a prior attempt to speak with patient with an interpreter but patient reportedly did not respond.  Patient's son was contacted who lives in [place] and has not seen his father in quite some time. He is not aware of any recent issues other than the patient not eating as well lately. ED contacted the facility where the patient resides who stated he usually takes his medication well but also noted has not been eating as well. There is also a note in the chart stating that the nursing facility has reported that the son and patients daughter do not answer phone calls from the facility (usually have to call from a private number). Last not reports the son stated that his sister should be contacted.   Unable to communicate with patient very well. He complains of pain but unsure if this is leg pain or abdominal pain.    Past Medical History:  Diagnosis Date   Acute systolic CHF (congestive heart failure) ([place]) 11/16/2021   CAD, 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   Hearing loss    HTN (hypertension)     Past Surgical History:  Procedure Laterality Date   ESOPHAGOGASTRODUODENOSCOPY (EGD) WITH PROPOFOL N/A 11/26/2021   Procedure: ESOPHAGOGASTRODUODENOSCOPY (EGD) WITH PROPOFOL;  Surgeon: Doran Stabler, MD;  Location: [facility];  Service: Gastroenterology;  Laterality: N/A;   EYE SURGERY     IABP INSERTION N/A 01/30/2021   Procedure: IABP Insertion;  Surgeon: Leonie Man, MD;  Location: [facility];  Service: Cardiovascular;  Laterality: N/A;   RIGHT/LEFT HEART CATH AND CORONARY ANGIOGRAPHY N/A 01/30/2021   Procedure: RIGHT/LEFT HEART CATH AND CORONARY ANGIOGRAPHY;  Surgeon: Leonie Man, MD;  Location: [facility];  Service: Cardiovascular;  Laterality: N/A;    Prior to Admission medications   Medication Sig Start Date End Date Taking? Authorizing Provider  acetaminophen (TYLENOL) 325 MG tablet Take 2 tablets (650 mg total) by mouth every 4 (four) hours as needed for headache or mild pain. 07/04/21   Isaiah Serge, NP  aspirin 81 MG EC tablet Take 1 tablet (81 mg total) by  mouth daily. Swallow whole. 11/28/21   Thurnell Lose, MD  atorvastatin (LIPITOR) 80 MG tablet Take 1 tablet (80 mg total) by mouth daily. 02/06/21   Hosie Poisson, MD  carvedilol (COREG) 6.25 MG tablet Take 1 tablet (6.25 mg total) by mouth 2 (two) times daily with a meal. 12/14/21 01/13/22  Darliss Cheney, MD  clopidogrel (PLAVIX) 75 MG tablet Take 1 tablet (75 mg total) by mouth daily. 02/06/21   Hosie Poisson, MD  ferrous sulfate 325 (65 FE) MG tablet Take 1 tablet (325 mg total) by mouth 2 (two) times daily with a meal. 11/28/21   Thurnell Lose, MD  finasteride (PROSCAR) 5 MG tablet Take 1 tablet (5 mg total) by mouth daily. 11/21/21   Lavina Hamman, MD  isosorbide-hydrALAZINE (BIDIL) 20-37.5 MG tablet Take 1 tablet by mouth 2 (two) times daily. 01/09/22   Domenic Polite, MD  JARDIANCE 10 MG TABS tablet Take 10 mg by mouth daily. 11/10/21   [provider]  metFORMIN (GLUCOPHAGE) 500 MG tablet Take 500 mg by mouth in the morning and at bedtime. 06/10/21   [provider]  nitroGLYCERIN (NITROSTAT) 0.4 MG SL tablet Place 1 tablet (0.4 mg total) under the tongue every 5 (five) minutes x 3 doses as needed for chest pain. 02/05/21   Hosie Poisson, MD  pantoprazole (PROTONIX) 40 MG tablet Take 1 tablet (40 mg total) by mouth 2 (two) times daily before a meal. 11/28/21   Thurnell Lose, MD  polyethylene glycol (MIRALAX / GLYCOLAX) 17 g packet Take 17 g by mouth daily as needed for mild constipation. 01/09/22   Domenic Polite, MD  tamsulosin (FLOMAX) 0.4 MG CAPS capsule Take 2 capsules (0.8 mg total) by mouth daily. 11/28/21   Thurnell Lose, MD  torsemide 40 MG TABS Take 40 mg by mouth daily. 01/10/22   Domenic Polite, MD  vitamin B-12 (CYANOCOBALAMIN) 500 MCG tablet Take 1 tablet (500 mcg total) by mouth daily. 11/21/21   Lavina Hamman, MD    Current Facility-Administered Medications  Medication Dose Route Frequency Provider Last Rate Last Admin   0.9 %  sodium chloride  infusion (Manually program via Guardrails IV Fluids)   Intravenous Once Zierle-Ghosh, Asia B, DO       0.9 %  sodium chloride infusion  250 mL Intravenous Continuous Zierle-Ghosh, Asia B, DO       atorvastatin (LIPITOR) tablet 80 mg  80 mg Oral Daily Barton Dubois, MD       clopidogrel (PLAVIX) tablet 75 mg  75 mg Oral Daily Barton Dubois, MD   75 mg at 09/04/22 7616   dextrose 5 % solution   Intravenous Continuous Barton Dubois, MD 100 mL/hr at 09/04/22 2337 Infusion Verify at 09/04/22 2337   haloperidol lactate (HALDOL) injection 1 mg  1 mg Intravenous Q8H PRN Barton Dubois, MD   1 mg at 09/04/22 2147   hydrocortisone (ANUSOL-HC) suppository 25 mg  25 mg Rectal BID Barton Dubois, MD       insulin aspart (novoLOG) injection 0-15 Units  0-15 Units Subcutaneous TID WC Zierle-Ghosh, Asia B, DO  insulin aspart (novoLOG) injection 0-5 Units  0-5 Units Subcutaneous QHS Zierle-Ghosh, Asia B, DO       insulin detemir (LEVEMIR) injection 5 Units  5 Units Subcutaneous BID Barton Dubois, MD       norepinephrine (LEVOPHED) '4mg'$  in 236m (0.016 mg/mL) premix infusion  2-10 mcg/min Intravenous Titrated Zierle-Ghosh, Asia B, DO 7.5 mL/hr at 09/05/22 048542 mcg/min at 09/05/22 06270  pantoprazole (PROTONIX) injection 40 mg  40 mg Intravenous Q12H Zierle-Ghosh, Asia B, DO        Allergies as of 09/03/2022   (No Known Allergies)    History reviewed. No pertinent family history.  Social History   Socioeconomic History   Marital status: Widowed    Spouse name: Not on file   Number of children: Not on file   Years of education: Not on file   Highest education level: Not on file  Occupational History   Not on file  Tobacco Use   Smoking status: Never   Smokeless tobacco: Never  Substance and Sexual Activity   Alcohol use: Never   Drug use: Never   Sexual activity: Not on file  Other Topics Concern   Not on file  Social History Narrative   ** Merged History Encounter **       ** Merged  History Encounter **       Social Determinants of Health   Financial Resource Strain: Not on file  Food Insecurity: No Food Insecurity (09/03/2022)   Hunger Vital Sign    Worried About Running Out of Food in the Last Year: Never true    Ran Out of Food in the Last Year: Never true  Transportation Needs: No Transportation Needs (09/03/2022)   PRAPARE - THydrologist(Medical): No    Lack of Transportation (Non-Medical): No  Physical Activity: Not on file  Stress: Not on file  Social Connections: Not on file  Intimate Partner Violence: Not on file     Review of Systems   Gen: Denies any fever, chills, loss of appetite, change in weight or weight loss CV: Denies chest pain, heart palpitations, syncope, edema  Resp: Denies shortness of breath with rest, cough, wheezing, coughing up blood, and pleurisy. GI: see HPI GU : Denies urinary burning, blood in urine, urinary frequency, and urinary incontinence. MS: Denies joint pain, limitation of movement, swelling, cramps, and atrophy.  Derm: Denies rash, itching, dry skin, hives. Psych: Denies depression, anxiety, memory loss, hallucinations, and confusion. Heme: Denies bruising or bleeding Neuro:  Denies any headaches, dizziness, paresthesias, shaking  Physical Exam   Vital Signs in last 24 hours: Temp:  [96.1 F (35.6 C)-98.7 F (37.1 C)] 96.1 F (35.6 C) (09/15 0807) Pulse Rate:  [57-97] 97 (09/14 2200) Resp:  [0-34] 23 (09/15 0345) BP: (90-135)/(34-68) 92/58 (09/15 0345) SpO2:  [78 %-100 %] 100 % (09/15 0345) Weight:  [47.6 kg] 47.6 kg (09/15 0500) Last BM Date : 09/04/22  General:   Alert, in mittens. Head:  Normocephalic and atraumatic. Eyes:  Sclera clear, no icterus.   Conjunctiva pink. Ears:  Normal auditory acuity. Mouth:  Poor dentition Lungs:  Clear throughout to auscultation.   No wheezes, crackles, or rhonchi. No acute distress. Heart: tachycardic, SJ5,K0no murmurs, clicks, rubs,  or  gallops. Abdomen:  Soft, nontender and nondistended. No masses, hepatosplenomegaly or hernias noted. Normal bowel sounds, without guarding, and without rebound.   Rectal: deferred Msk:  Symmetrical without gross deformities. Normal posture. Extremities:  Without clubbing or edema. Neurologic:  Alert, anxious Skin:  Intact without significant lesions or rashes. Psych:  Alert and cooperative. Unable to communicate given language barrier  Intake/Output from previous day: 09/14 0701 - 09/15 0700 In: 1540.8 [I.V.:1540.8] Out: 1600 [Urine:950; Stool:450; Blood:200] Intake/Output this shift: No intake/output data recorded.   Labs/Studies   Recent Labs Recent Labs    09/03/22 1258 09/04/22 2120 09/05/22 0554  WBC 14.7* 17.7* 12.7*  HGB 12.9* 10.2* 7.7*  HCT 42.7 33.6* 26.9*  PLT 572* 431* 350   BMET Recent Labs    09/04/22 0354 09/04/22 0652 09/05/22 0554  NA 158* 160* 155*  K 3.7 4.1 4.4  CL 122* 122* 122*  CO2 26 27 20*  GLUCOSE 145* 242* 316*  BUN 97* 96* 79*  CREATININE 3.20* 3.19* 2.78*  CALCIUM 9.0 8.9 8.2*   LFT Recent Labs    09/03/22 1258  PROT 8.1  ALBUMIN 3.1*  AST 37  ALT 17  ALKPHOS 98  BILITOT 0.8   PT/INR Recent Labs    09/03/22 1258  LABPROT 16.9*  INR 1.4*   Hepatitis Panel No results for input(s): "HEPBSAG", "HCVAB", "HEPAIGM", "HEPBIGM" in the last 72 hours. C-Diff No results for input(s): "CDIFFTOX" in the last 72 hours.  Radiology/Studies CT Head Wo Contrast  Result Date: 09/03/2022 CLINICAL DATA:  Altered mental status EXAM: CT HEAD WITHOUT CONTRAST TECHNIQUE: Contiguous axial images were obtained from the base of the skull through the vertex without intravenous contrast. RADIATION DOSE REDUCTION: This exam was performed according to the departmental dose-optimization program which includes automated exposure control, adjustment of the mA and/or kV according to patient size and/or use of iterative reconstruction technique.  COMPARISON:  None Available. FINDINGS: Brain: No acute intracranial findings are seen. There are no signs of bleeding within the cranium. Minimal calcifications are seen in basal ganglia. Cortical sulci are prominent. There is small area of encephalomalacia in the right frontal cortex. There is decreased density in periventricular and subcortical white matter. Vascular: Scattered arterial calcifications are seen. Skull: Unremarkable. Sinuses/Orbits: Unremarkable. Other: None. IMPRESSION: No acute intracranial findings are seen in noncontrast CT brain. Atrophy. 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 Electronically Signed   By: Elmer Picker M.D.   On: 09/03/2022 15:24   DG Chest Port 1 View  Result Date: 09/03/2022 CLINICAL DATA:  Altered mental status EXAM: PORTABLE CHEST 1 VIEW COMPARISON:  Radiograph 12/30/2021 FINDINGS: Unchanged cardiomediastinal silhouette. There is no focal airspace consolidation. There is no pleural effusion. No pneumothorax. There is no acute osseous abnormality. Unchanged mildly elevated right hemidiaphragm. IMPRESSION: No evidence of acute cardiopulmonary disease. Unchanged mildly elevated right hemidiaphragm Electronically Signed   By: Maurine Simmering M.D.   On: 09/03/2022 13:50     Assessment   Randy [name] is a 81 y.o. year old male history of chronic systolic heart failure, HTN, HLD, CAD, GERD, and BPH who presented to the ED due to change in mentation and elevated blood sugar.   Lower GI bleeding/rectal bleeding: Hgb 12.9 on admission with drop to 10.2 yesterday likely secondary to hyperglycemia and hypernatremia.  Yesterday patient was disimpacted and had large green soft stool followed by large maroon-colored stool, vital signs were stable.  Initially on admission he was fairly obtunded but became agitated yesterday afternoon requiring sedatives.  Currently he is in mittens and overnight/early morning he became obtunded again with  hypotension, BP was 64/29 manually.  Recheck hemoglobin this morning was 7.7 and nursing reported head to  toe bowel movement with bright red blood and clots early this morning. Will need to monitor H/H frequently.  He is in need of blood transfusion and CTA stat.  CTA was attempted however patient has poor IV access currently as he is on IV fluids and vasopressors running through a single IV.  He is in need of a midline or preferably a central line to receive necessary medications if unable to obtain IV access. Recheck Hgb this morning down to 6.3. Continue PPI IV BID.   Follow up late morning: Additional IV access obtained and patient currently receiving blood. Blood pressure continues to run soft  (80s/50s). After receiving blood, CTA should be performed to evaluate for source of bleeding.   AKI: Cr. 2.78 today (3.87 on admission). Receiving IV fluid bolus for hypotension currently. Will likely have worsening function as he needs CTA for possible GI bleed.   Plan / Recommendations   IV fluids and vasopressor support to maintain systolic BP > 621. Strict n.p.o. 2u PRBC STAT CTA abdomen STAT CBC every 8 hours Needs additional IV access, preferably central line if unable to obtain in order to perform CTA PPI IV twice daily    09/05/2022, 8:54 AM  Venetia Night, MSN, FNP-BC, AGACNP-BC [facility]

## 2022-09-05 NOTE — Progress Notes (Signed)
Date and time results received: 09/05/22 1018 (use smartphrase ".now" to insert current time)  Test: HGB  Critical Value: 6.3  Name of Provider Notified: Dr Dyann Kief  Orders Received? Or Actions Taken?:  Patient has 2 units of PRBC ordered. Patient nurse called lab to see if unit is ready.

## 2022-09-06 DIAGNOSIS — E1100 Type 2 diabetes mellitus with hyperosmolarity without nonketotic hyperglycemic-hyperosmolar coma (NKHHC): Secondary | ICD-10-CM | POA: Diagnosis not present

## 2022-09-06 DIAGNOSIS — N400 Enlarged prostate without lower urinary tract symptoms: Secondary | ICD-10-CM | POA: Diagnosis not present

## 2022-09-06 DIAGNOSIS — N179 Acute kidney failure, unspecified: Secondary | ICD-10-CM | POA: Diagnosis not present

## 2022-09-06 DIAGNOSIS — R578 Other shock: Secondary | ICD-10-CM

## 2022-09-06 DIAGNOSIS — K625 Hemorrhage of anus and rectum: Secondary | ICD-10-CM | POA: Diagnosis not present

## 2022-09-06 DIAGNOSIS — K219 Gastro-esophageal reflux disease without esophagitis: Secondary | ICD-10-CM | POA: Diagnosis not present

## 2022-09-06 DIAGNOSIS — I2510 Atherosclerotic heart disease of native coronary artery without angina pectoris: Secondary | ICD-10-CM | POA: Diagnosis not present

## 2022-09-06 LAB — BPAM RBC
Blood Product Expiration Date: 202310122359
Blood Product Expiration Date: 202310132359
ISSUE DATE / TIME: 202309151118
ISSUE DATE / TIME: 202309151531
Unit Type and Rh: 1700
Unit Type and Rh: 1700

## 2022-09-06 LAB — CBC
HCT: 27.4 % — ABNORMAL LOW (ref 39.0–52.0)
Hemoglobin: 8.9 g/dL — ABNORMAL LOW (ref 13.0–17.0)
MCH: 28.5 pg (ref 26.0–34.0)
MCHC: 32.5 g/dL (ref 30.0–36.0)
MCV: 87.8 fL (ref 80.0–100.0)
Platelets: 275 10*3/uL (ref 150–400)
RBC: 3.12 MIL/uL — ABNORMAL LOW (ref 4.22–5.81)
RDW: 15.2 % (ref 11.5–15.5)
WBC: 9.7 10*3/uL (ref 4.0–10.5)
nRBC: 0.6 % — ABNORMAL HIGH (ref 0.0–0.2)

## 2022-09-06 LAB — TYPE AND SCREEN
ABO/RH(D): B POS
Antibody Screen: NEGATIVE
Unit division: 0
Unit division: 0

## 2022-09-06 LAB — GLUCOSE, CAPILLARY
Glucose-Capillary: 177 mg/dL — ABNORMAL HIGH (ref 70–99)
Glucose-Capillary: 282 mg/dL — ABNORMAL HIGH (ref 70–99)
Glucose-Capillary: 206 mg/dL — ABNORMAL HIGH (ref 70–99)
Glucose-Capillary: 114 mg/dL — ABNORMAL HIGH (ref 70–99)
Glucose-Capillary: 160 mg/dL — ABNORMAL HIGH (ref 70–99)

## 2022-09-06 LAB — HEMOGLOBIN AND HEMATOCRIT, BLOOD
HCT: 32.2 % — ABNORMAL LOW (ref 39.0–52.0)
HCT: 27.9 % — ABNORMAL LOW (ref 39.0–52.0)
Hemoglobin: 10.5 g/dL — ABNORMAL LOW (ref 13.0–17.0)
Hemoglobin: 9.1 g/dL — ABNORMAL LOW (ref 13.0–17.0)

## 2022-09-06 MED ORDER — HYDROCORTISONE ACETATE 25 MG RE SUPP: 25.0000 mg | Freq: Two times a day (BID) | RECTAL | Status: DC | PRN

## 2022-09-06 MED ORDER — ORAL CARE MOUTH RINSE
15.0000 mL | OROMUCOSAL | Status: DC
  Administered 2022-09-06 – 2022-09-07 (×5): 15 mL via OROMUCOSAL

## 2022-09-06 MED ORDER — ORAL CARE MOUTH RINSE: 15.0000 mL | OROMUCOSAL | Status: DC | PRN

## 2022-09-06 NOTE — Assessment & Plan Note (Addendum)
-  acute blood loss anemia In the setting of GI bleed. 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 -Currently hemodynamically stable and not requiring pressors support. -After transfusion hemoglobin up to 8.2 -No further acute bleeding appreciated -Will continue to follow clinical response.

## 2022-09-06 NOTE — Progress Notes (Signed)
Spoke with RN, states CVC is in and working well.  RN to cancel PICC order.

## 2022-09-06 NOTE — Assessment & Plan Note (Signed)
-   No clear source of GI bleed identified at this moment -Continue conservative management -GI service on board; will follow recommendations.  -Planning for EGD on 09/09/2022.  -Continue PPI.

## 2022-09-06 NOTE — Progress Notes (Signed)
[facility]  Patient seen and examined.  Right IJ central line in place with no associated hematoma, induration, or tenderness. Line working without issue.  Care per primary team.  Please call with any questions or concerns.  Graciella Freer, DO [facility] [address] [place],  [address] [phone] (office)

## 2022-09-06 NOTE — Progress Notes (Signed)
Subjective: Patient more alert today, Levophed weaning down.  Hemoglobin improved to 9.1, ICU RN states no further bleeding.  Objective: Vital signs in last 24 hours: Temp:  [96.6 F (35.9 C)-99.1 F (37.3 C)] 98.8 F (37.1 C) (09/16 1106) Pulse Rate:  [48-72] 64 (09/16 1145) Resp:  [8-22] 17 (09/16 1145) BP: (62-157)/(14-97) 135/83 (09/16 1145) SpO2:  [99 %-100 %] 100 % (09/16 1145) Last BM Date : 09/05/22 General:   Alert and oriented, pleasant Head:  Normocephalic and atraumatic. Eyes:  No icterus, sclera clear. Conjuctiva pink.  Abdomen:  Bowel sounds present, soft, non-tender, non-distended. No HSM or hernias noted. No rebound or guarding. No masses appreciated  Msk:  Symmetrical without gross deformities. Normal posture. Extremities:  Without clubbing or edema. Neurologic:  Alert and  oriented x4;  grossly normal neurologically. Skin:  Warm and dry, intact without significant lesions.  Cervical Nodes:  No significant cervical adenopathy. Psych:  Alert and cooperative. Normal mood and affect.  Intake/Output from previous day: 09/15 0701 - 09/16 0700 In: 2282.9 [I.V.:1528.2; Blood:754.7] Out: -  Intake/Output this shift: Total I/O In: 416.4 [I.V.:416.4] Out: 450 [Urine:450]  Lab Results: Recent Labs    09/04/22 2120 09/05/22 0554 09/05/22 0948 09/05/22 2020 09/06/22 0152 09/06/22 0911  WBC 17.7* 12.7*  --  10.9*  --   --   HGB 10.2* 7.7*   < > 10.7* 10.5* 9.1*  HCT 33.6* 26.9*   < > 32.7* 32.2* 27.9*  PLT 431* 350  --  316  --   --    < > = values in this interval not displayed.   BMET Recent Labs    09/04/22 0354 09/04/22 0652 09/05/22 0554  NA 158* 160* 155*  K 3.7 4.1 4.4  CL 122* 122* 122*  CO2 26 27 20*  GLUCOSE 145* 242* 316*  BUN 97* 96* 79*  CREATININE 3.20* 3.19* 2.78*  CALCIUM 9.0 8.9 8.2*   LFT Recent Labs    09/03/22 1258  PROT 8.1  ALBUMIN 3.1*  AST 37  ALT 17  ALKPHOS 98  BILITOT 0.8   PT/INR Recent Labs    09/03/22 1258   LABPROT 16.9*  INR 1.4*   Hepatitis Panel No results for input(s): "HEPBSAG", "HCVAB", "HEPAIGM", "HEPBIGM" in the last 72 hours.   Studies/Results: CT ANGIO GI BLEED  Result Date: 09/05/2022 CLINICAL DATA:  This impaction with rectal bleeding EXAM: CTA ABDOMEN AND PELVIS WITHOUT AND WITH CONTRAST TECHNIQUE: Multidetector CT imaging of the abdomen and pelvis was performed using the standard protocol during bolus administration of intravenous contrast. Multiplanar reconstructed images and MIPs were obtained and reviewed to evaluate the vascular anatomy. RADIATION DOSE REDUCTION: This exam was performed according to the departmental dose-optimization program which includes automated exposure control, adjustment of the mA and/or kV according to patient size and/or use of iterative reconstruction technique. CONTRAST:  63m OMNIPAQUE IOHEXOL 350 MG/ML SOLN COMPARISON:  None Available. FINDINGS: VASCULAR Aorta: No evidence abdominal aortic aneurysm or dissection. Atherosclerotic calcifications. Patent. Celiac: Patent.  Atherosclerotic calcifications at the origin. SMA: Patent.  Atherosclerotic calcifications at the origin. Renals: Patent. Atherosclerotic calcifications the origin on the right. IMA: Patent. Inflow: Patent bilaterally.  Atherosclerotic calcifications. Proximal Outflow: Patent bilaterally. Veins: Within normal limits. Review of the MIP images confirms the above findings. NON-VASCULAR Lower chest: Lung bases are clear. Hepatobiliary: Subcentimeter cyst in segment 4A (series 15/image 10), benign. No follow-up is recommended. Scattered calcified granulomata. Gallbladder is unremarkable. No intrahepatic or extrahepatic ductal dilatation. Pancreas: Within normal  limits. Spleen: Calcified splenic granulomata. Adrenals/Urinary Tract: Adrenal glands are within normal limits. Kidneys are within normal limits. No hydronephrosis. Thick-walled bladder, likely reflecting chronic bladder outlet obstruction.  Stomach/Bowel: Stomach is within normal limits. No evidence of bowel obstruction. Normal appendix (series 15/image 47). No bowel wall thickening or inflammatory changes. Specifically, no rectal wall thickening or mass is evident on CT. No active extravasation of contrast following contrast administration to suggest active GI bleeding. Lymphatic: No suspicious abdominopelvic lymphadenopathy. Reproductive: Prostatomegaly, indenting the base of the bladder, reflecting BPH. Other: No abdominopelvic ascites. Musculoskeletal: Degenerative changes of the lumbar spine. Grade 1 spondylolisthesis at L5-S1. Mild superior endplate changes with Smalls node deformity at L4, likely chronic. IMPRESSION: No evidence of active GI bleeding. No rectal wall thickening or mass is evident on CT. BPH with chronic bladder outlet obstruction. Patent abdominal vasculature. Additional ancillary findings as above. Electronically Signed   By: Julian Hy M.D.   On: 09/05/2022 19:14   DG CHEST PORT 1 VIEW  Result Date: 09/05/2022 CLINICAL DATA:  Placement of central venous catheter EXAM: PORTABLE CHEST 1 VIEW COMPARISON:  Previous studies including the examination of 09/03/2022 FINDINGS: Transverse diameter of heart is increased. There are no signs of pulmonary edema or focal pulmonary consolidation. There is no pleural effusion or pneumothorax. There is interval placement of right IJ central venous catheter with its tip in superior vena cava. IMPRESSION: Tip of right IJ central venous catheter is seen in superior vena cava. There is no pneumothorax. Electronically Signed   By: Elmer Picker M.D.   On: 09/05/2022 18:10   [place] EKG SITE RITE  Result Date: 09/05/2022 If Site Rite image not attached, placement could not be confirmed due to current cardiac rhythm.   Assessment: *Lower GI bleed-source unclear *Hemorrhagic shock due to above-improving *Chronic GERD  Plan: Patient appears clinically improved compared to  yesterday.  Levophed currently being weaned down.  Maps are much better.    Continue supportive care.  Transfuse for less than 7 or or further evidence of hemodynamic instability or evidence of significant GI bleeding.  CTA the abdomen unremarkable for active GI bleed.  Continue PPI IV twice daily.  GI to continue to follow.  Elon Alas. Abbey Chatters, D.O. Gastroenterology and Hepatology [facility]   LOS: 3 days    09/06/2022, 12:02 PM

## 2022-09-06 NOTE — Progress Notes (Signed)
Performed bedside swallow screen, patient passed. Will make MD aware as patient is more alert.

## 2022-09-06 NOTE — Progress Notes (Signed)
Progress Note   Patient: Randy [name] VHQ:[id number] DOB: 1941-04-24 DOA: 09/03/2022     3 DOS: the patient was seen and examined on 09/06/2022   Brief hospital course: Randy [name] is a 81 y.o. male with medical history significant of chronic systolic heart failure, hypertension, hyperlipidemia, coronary artery disease, GERD and BPH; who was brought to the hospital secondary to change in mentation and elevated blood sugar. Patient mentation not allowing for him to participate in interview; currently agitated confused and not following commands. CT head demonstrated no acute intracranial abnormalities; blood work suggesting the presence of hyperosmolar hyperglycemic crisis with blood sugar in the 970 range.  Normal bicarb and worsening renal function. No acute signs of infection appreciated.   IV fluids, insulin therapy initiated.  TRH contacted to place patient in the hospital for further evaluation and management.  Assessment and Plan: Acute metabolic encephalopathy -Multifactorial: In the setting of hypernatremia, hyperglycemic state, uremia/worsening renal function and dehydration -Continue IV fluids, but adjust rate to prvent HF exacerbation and congestion. -Continue supportive care, -Continue constant reorientation and continue the use of as needed Haldol. -continue Sitter at bedside  * 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 ([place]) -CBGs in the 170-280 range -Currently unable to eat or drink spontaneously -Still with hypernatremia -also requiring to be NPO due to GIB -Continue D5 W and the use of SSI and low dose lantus -Continue to follow CBGs fluctuation and further adjust hypoglycemia regimen as needed.. -Overall hyperosmolar state corrected.  Hemorrhagic shock ([place]) - In the setting of GI bleed -Requiring pressor supports to stabilize condition and vital signs -After transfusion hemoglobin up to 9.4 -Blood pressure has stabilized and no  further acute bleeding appreciated -Will start weaning patient off pressor support.  Gastrointestinal hemorrhage - No clear source of GI bleed identified at this moment -Continue conservative management -GI service on board; will follow recommendations.  At this moment conservative management and not plans for endoscopic evaluation yet.  BPH (benign prostatic hyperplasia) -Continue holding Flomax and Proscar in the setting of low blood pressure -No signs of urinary retention currently. -Close monitoring for any symptoms.  GERD (gastroesophageal reflux disease) -Continue PPI. -Dose adjusted to twice a day and given through his veins. -Due to acute GI bleed GI service has been consulted and will follow recommendations. -CT angiogram negative identifying acute source of bleeding.  Bleeding episodes reported -Stable hemoglobin after transfusion -Continue to follow hemoglobin trend and follow GI service recommendation. -Heparin for DVT prophylaxis discontinued; SCDs in place -Continue holding aspirin and Plavix currently.  Chronic systolic CHF (congestive heart failure) (HCC) -Stable and currently compensated -Continue to follow daily weights and strict I's and O's -Continue judicious fluid resuscitation as patient presented with hyperosmolar hyperglycemic crisis and hyponatremia. -Continue holding diuretics initially given worsening renal function.  Mixed hyperlipidemia -Resume the use of a statins when able to tolerate p.o.'s.  Primary hypertension -Blood pressure soft but currently stable with the use of pressors. -Continue to wean off Levophed as tolerated -Continue to follow vital signs. -Continue holding antihypertensive agents at this point. -Appear to be secondary to GI bleed. -Continue adequate fluid resuscitation and blood transfusions as needed. -Continue to follow-up vital signs.  CAD, multiple vessel -No chest pain or shortness of breath currently -Continue home  medication once blood pressure stabilized and continue risk factor modification -Continue telemetry monitoring.  AKI-CKD 3B -In the setting of dehydration -Continue to hold nephrotoxic agents -Continue IV fluid resuscitation -Continue to follow renal function trend.  Subjective:  No agitation present at this moment; vital signs has stabilized with the use of pressors and blood transfusion.  No further bleeding events.  No nausea, no vomiting, no chest pain or abdominal pain.  Still slightly confused but able to follow simple commands.  Physical Exam: Vitals:   09/06/22 0930 09/06/22 0945 09/06/22 1000 09/06/22 1015  BP: (!) 121/35 (!) 106/35 (!) 129/97 (!) 115/37  Pulse: (!) 57 (!) 54 (!) 59 (!) 55  Resp: 15 17 (!) 21 16  Temp:      TempSrc:      SpO2: 100% 100% 100% 100%  Weight:      Height:       General exam: Still confused but following commands; no further GI bleed events reported. Respiratory system: Clear to auscultation. Respiratory effort normal.  Good saturation on room air. Cardiovascular system:RRR. No rubs or gallops; no JVD. Gastrointestinal system: Abdomen is nondistended, soft and nontender. No organomegaly or masses felt. Normal bowel sounds heard. Central nervous system: Following simple commands; moving 4 limbs.  No focal deficits. Extremities: No cyanosis or clubbing. Skin: No petechiae. Psychiatry: Judgement and insight appear impaired in the setting of encephalopathy.  Data Reviewed: CBGs in the 170-200 range. H&H demonstrating hemoglobin of 9.1 and a hematocrit of 27.9.  Family Communication: No family at bedside.  Son updated over the phone on 09/05/22.  Unable to reach for updates on 09/06/22  Disposition: Status is: Inpatient Remains inpatient appropriate because: Still requiring IV therapy; mentation now back to baseline.   Planned Discharge Destination: Skilled nursing facility long-term resident.   Author: Barton Dubois, MD 09/06/2022 10:38  AM  For on call review [email or web address].

## 2022-09-06 NOTE — Plan of Care (Signed)
Patient remains in CCU with low dose levophed infusion. Patient self-removed all of his peripheral IV; central access only now. No safety sitter available from the facility or HCS.   Problem: Education: Goal: Knowledge of General Education information will improve Description: Including pain rating scale, medication(s)/side effects and non-pharmacologic comfort measures Outcome: Progressing   Problem: Health Behavior/Discharge Planning: Goal: Ability to manage health-related needs will improve Outcome: Progressing   Problem: Clinical Measurements: Goal: Ability to maintain clinical measurements within normal limits will improve Outcome: Progressing Goal: Will remain free from infection Outcome: Progressing Goal: Diagnostic test results will improve Outcome: Progressing Goal: Respiratory complications will improve Outcome: Progressing Goal: Cardiovascular complication will be avoided Outcome: Progressing   Problem: Activity: Goal: Risk for activity intolerance will decrease Outcome: Progressing   Problem: Nutrition: Goal: Adequate nutrition will be maintained Outcome: Progressing   Problem: Coping: Goal: Level of anxiety will decrease Outcome: Progressing   Problem: Elimination: Goal: Will not experience complications related to bowel motility Outcome: Progressing Goal: Will not experience complications related to urinary retention Outcome: Progressing   Problem: Pain Managment: Goal: General experience of comfort will improve Outcome: Progressing   Problem: Safety: Goal: Ability to remain free from injury will improve Outcome: Progressing   Problem: Skin Integrity: Goal: Risk for impaired skin integrity will decrease Outcome: Progressing   Problem: Education: Goal: Ability to describe self-care measures that may prevent or decrease complications (Diabetes Survival Skills Education) will improve Outcome: Progressing Goal: Individualized Educational  Video(s) Outcome: Progressing   Problem: Coping: Goal: Ability to adjust to condition or change in health will improve Outcome: Progressing   Problem: Fluid Volume: Goal: Ability to maintain a balanced intake and output will improve Outcome: Progressing   Problem: Health Behavior/Discharge Planning: Goal: Ability to identify and utilize available resources and services will improve Outcome: Progressing Goal: Ability to manage health-related needs will improve Outcome: Progressing   Problem: Metabolic: Goal: Ability to maintain appropriate glucose levels will improve Outcome: Progressing   Problem: Nutritional: Goal: Maintenance of adequate nutrition will improve Outcome: Progressing Goal: Progress toward achieving an optimal weight will improve Outcome: Progressing   Problem: Skin Integrity: Goal: Risk for impaired skin integrity will decrease Outcome: Progressing   Problem: Tissue Perfusion: Goal: Adequacy of tissue perfusion will improve Outcome: Progressing   Problem: Education: Goal: Ability to describe self-care measures that may prevent or decrease complications (Diabetes Survival Skills Education) will improve Outcome: Progressing Goal: Individualized Educational Video(s) Outcome: Progressing   Problem: Cardiac: Goal: Ability to maintain an adequate cardiac output will improve Outcome: Progressing   Problem: Health Behavior/Discharge Planning: Goal: Ability to identify and utilize available resources and services will improve Outcome: Progressing Goal: Ability to manage health-related needs will improve Outcome: Progressing   Problem: Fluid Volume: Goal: Ability to achieve a balanced intake and output will improve Outcome: Progressing   Problem: Metabolic: Goal: Ability to maintain appropriate glucose levels will improve Outcome: Progressing   Problem: Nutritional: Goal: Maintenance of adequate nutrition will improve Outcome: Progressing Goal:  Maintenance of adequate weight for body size and type will improve Outcome: Progressing   Problem: Respiratory: Goal: Will regain and/or maintain adequate ventilation Outcome: Progressing   Problem: Urinary Elimination: Goal: Ability to achieve and maintain adequate renal perfusion and functioning will improve Outcome: Progressing

## 2022-09-06 NOTE — Progress Notes (Signed)
Patient initially did well with thin liquids such as water, yet as more was offered patient seemed to cough more and get strangled after drinking thin liquids. Offered patient more thicker liquids such as the nectar thick lemon water and some applesauce and patient had little to no difficulty at all. Will keep patient on clear liquid diet but pass on to night shift that patient does well with thickened liquids. SLP consult placed. Will continue to monitor.

## 2022-09-07 DIAGNOSIS — E1100 Type 2 diabetes mellitus with hyperosmolarity without nonketotic hyperglycemic-hyperosmolar coma (NKHHC): Secondary | ICD-10-CM | POA: Diagnosis not present

## 2022-09-07 DIAGNOSIS — I2510 Atherosclerotic heart disease of native coronary artery without angina pectoris: Secondary | ICD-10-CM | POA: Diagnosis not present

## 2022-09-07 DIAGNOSIS — N400 Enlarged prostate without lower urinary tract symptoms: Secondary | ICD-10-CM | POA: Diagnosis not present

## 2022-09-07 DIAGNOSIS — N179 Acute kidney failure, unspecified: Secondary | ICD-10-CM | POA: Diagnosis not present

## 2022-09-07 LAB — GLUCOSE, CAPILLARY
Glucose-Capillary: 108 mg/dL — ABNORMAL HIGH (ref 70–99)
Glucose-Capillary: 110 mg/dL — ABNORMAL HIGH (ref 70–99)
Glucose-Capillary: 153 mg/dL — ABNORMAL HIGH (ref 70–99)
Glucose-Capillary: 157 mg/dL — ABNORMAL HIGH (ref 70–99)
Glucose-Capillary: 87 mg/dL (ref 70–99)
Glucose-Capillary: 98 mg/dL (ref 70–99)

## 2022-09-07 LAB — BASIC METABOLIC PANEL
Anion gap: 5 (ref 5–15)
BUN: 32 mg/dL — ABNORMAL HIGH (ref 8–23)
CO2: 23 mmol/L (ref 22–32)
Calcium: 7 mg/dL — ABNORMAL LOW (ref 8.9–10.3)
Chloride: 113 mmol/L — ABNORMAL HIGH (ref 98–111)
Creatinine, Ser: 1.81 mg/dL — ABNORMAL HIGH (ref 0.61–1.24)
GFR, Estimated: 37 mL/min — ABNORMAL LOW (ref >60)
Glucose, Bld: 162 mg/dL — ABNORMAL HIGH (ref 70–99)
Potassium: 3.1 mmol/L — ABNORMAL LOW (ref 3.5–5.1)
Sodium: 141 mmol/L (ref 135–145)

## 2022-09-07 LAB — CBC
HCT: 26.1 % — ABNORMAL LOW (ref 39.0–52.0)
Hemoglobin: 8.4 g/dL — ABNORMAL LOW (ref 13.0–17.0)
MCH: 28.3 pg (ref 26.0–34.0)
MCHC: 32.2 g/dL (ref 30.0–36.0)
MCV: 87.9 fL (ref 80.0–100.0)
Platelets: 235 10*3/uL (ref 150–400)
RBC: 2.97 MIL/uL — ABNORMAL LOW (ref 4.22–5.81)
RDW: 15 % (ref 11.5–15.5)
WBC: 8.2 10*3/uL (ref 4.0–10.5)
nRBC: 0.6 % — ABNORMAL HIGH (ref 0.0–0.2)

## 2022-09-07 MED ORDER — GUAIFENESIN-DM 100-10 MG/5ML PO SYRP: 5.0000 mL | ORAL_SOLUTION | Freq: Three times a day (TID) | ORAL | Status: DC | PRN

## 2022-09-07 MED ORDER — POTASSIUM CHLORIDE 10 MEQ/100ML IV SOLN
10.0000 meq | INTRAVENOUS | Status: AC
  Administered 2022-09-07 (×3): 10 meq via INTRAVENOUS
  Filled 2022-09-07 (×3): qty 100

## 2022-09-07 MED ORDER — POTASSIUM CHLORIDE 10 MEQ/100ML IV SOLN
10.0000 meq | INTRAVENOUS | Status: AC
  Administered 2022-09-07 (×4): 10 meq via INTRAVENOUS
  Filled 2022-09-07 (×4): qty 100

## 2022-09-07 MED ORDER — ORAL CARE MOUTH RINSE: 15.0000 mL | OROMUCOSAL | Status: DC | PRN

## 2022-09-07 NOTE — Progress Notes (Signed)
Progress Note   Patient: Randy [name] VFI:[id number] DOB: 02-21-1941 DOA: 09/03/2022     4 DOS: the patient was seen and examined on 09/07/2022   Brief hospital course: Randy [name] is a 81 y.o. male with medical history significant of chronic systolic heart failure, hypertension, hyperlipidemia, coronary artery disease, GERD and BPH; who was brought to the hospital secondary to change in mentation and elevated blood sugar. Patient mentation not allowing for him to participate in interview; currently agitated confused and not following commands. CT head demonstrated no acute intracranial abnormalities; blood work suggesting the presence of hyperosmolar hyperglycemic crisis with blood sugar in the 970 range.  Normal bicarb and worsening renal function. No acute signs of infection appreciated.   IV fluids, insulin therapy initiated.  TRH contacted to place patient in the hospital for further evaluation and management.  Assessment and Plan: Acute metabolic encephalopathy -Multifactorial: In the setting of hypernatremia, hyperglycemic state, uremia/worsening renal function and dehydration -Continue IV fluids (rate adjusted to prevent HF exacerbation and congestion). -Continue supportive care -Continue constant reorientation and continue the use of as needed Haldol. -Follow clinical response.  * 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 (Randy [name]) -CBGs in the 170-280 range -Currently unable to eat or drink spontaneously -Hyponatremia currently resolved; IV fluids rates will be adjusted, patient's diet advance and will continue to follow CBGs to further adjust hypoglycemic regimen. -Overall hyperosmolar state corrected.  Hemorrhagic shock (Randy [name]) - In the setting of GI bleed -Currently hemodynamically stable and not requiring pressors support. -After transfusion hemoglobin up to 8.4 -No further acute bleeding appreciated -Will continue to follow clinical  response.  Gastrointestinal hemorrhage - No clear source of GI bleed identified at this moment -Continue conservative management -GI service on board; will follow recommendations.  At this moment conservative management and not plans for endoscopic evaluation at this moment. -Continue PPI.  BPH (benign prostatic hyperplasia) -Continue holding Flomax and Proscar in the setting of low blood pressure -No signs of urinary retention currently. -Close monitoring for any symptoms.  GERD (gastroesophageal reflux disease) -Continue PPI. -Dose adjusted to twice a day and given through his veins. -Due to acute GI bleed GI service has been consulted and will follow recommendations. -CT angiogram negative identifying acute source of bleeding.  Bleeding episodes reported -Stable hemoglobin after transfusion; continue to follow trend. -Heparin for DVT prophylaxis discontinued; SCDs in place -Continue holding aspirin and Plavix currently. -Diet advance to be advanced to dysphagia 1 with nectar thick liquids.  Chronic systolic CHF (congestive heart failure) (HCC) -Stable and currently compensated -Continue to follow daily weights and strict I's and O's -Continue judicious fluid resuscitation as patient presented with hyperosmolar hyperglycemic crisis and hyponatremia. -Continue holding diuretics initially given worsening renal function.  Mixed hyperlipidemia -Resume the use of statins when able to tolerate p.o.'s.  Primary hypertension -Blood pressure soft but currently stable without the need for pressor supports. -Continue to follow vital signs. -Continue holding antihypertensive agents at this point. -Appear to be secondary to GI bleed/hypovolemic shock. -Continue to follow hemoglobin trend and transfuse as needed.   CAD, multiple vessel -No chest pain or shortness of breath currently -Continue home medication once blood pressure stabilized and continue risk factor modification -Continue  telemetry monitoring.  AKI-CKD 3B -In the setting of dehydration -Continue to hold nephrotoxic agents -Continue IV fluid resuscitation -Continue to follow renal function trend. -Creatinine 1.78; pretty much back to his baseline at this moment. -Adjusting IV fluids rate.   Subjective:  Intermittently confused  as per nursing staff report; no chest pain, no nausea, no vomiting.  No further bleeding events.  Off Levophed and with a stable vital signs.  Physical Exam: Vitals:   09/07/22 1500 09/07/22 1530 09/07/22 1600 09/07/22 1630  BP: 111/70 (!) 124/49 122/67 (!) 120/52  Pulse: 82 80 96 91  Resp: (!) 24 (!) 21 (!) 24 (!) 28  Temp: 99.1 F (37.3 C)     TempSrc: Oral     SpO2: 96% 96% 96% 94%  Weight:      Height:       General exam: Alert, awake, oriented x 1-2; following commands appropriately.  Intermittent episodes of agitation reported by nursing staff.  So far tolerating liquid diet.  No further bleeding episodes.  Off Levophed and with stable blood vital signs. Respiratory system: Clear to auscultation. Respiratory effort normal.  Good saturation on room air. Cardiovascular system: Rate controlled, no rubs, gallops and not JVD. Gastrointestinal system: Abdomen is nondistended, soft and nontender. No organomegaly or masses felt. Normal bowel sounds heard. Central nervous system: Alert and oriented. No focal neurological deficits. Extremities: No cyanosis or clubbing.  No edema. Skin: No petechiae. Psychiatry: Judgement and insight appear impaired mainly from language barrier and episodes of intermittent confusion.  Data Reviewed: Basic metabolic panel: Sodium 836, potassium 3.1, chloride 113, bicarb 23, glucose 162, creatinine 1.81, BUN 32 and GFR 37. CBC: WBCs 8.2, hemoglobin 8.4, platelet count 235 K.  Family Communication: No family at bedside. Disposition: Status is: Inpatient Remains inpatient appropriate because: Still requiring IV therapy; mentation now back to  baseline.   Planned Discharge Destination: Skilled nursing facility long-term resident.   Author: Barton Dubois, MD 09/07/2022 5:05 PM  For on call review [email or web address].

## 2022-09-07 NOTE — Plan of Care (Signed)
Patient remains in CCU despite no longer requiring pressors or supplemental O2. Total assist for ADLs.   Problem: Education: Goal: Knowledge of General Education information will improve Description: Including pain rating scale, medication(s)/side effects and non-pharmacologic comfort measures Outcome: Not Progressing   Problem: Health Behavior/Discharge Planning: Goal: Ability to manage health-related needs will improve Outcome: Not Progressing   Problem: Clinical Measurements: Goal: Ability to maintain clinical measurements within normal limits will improve Outcome: Not Progressing Goal: Will remain free from infection Outcome: Not Progressing Goal: Diagnostic test results will improve Outcome: Not Progressing Goal: Respiratory complications will improve Outcome: Not Progressing Goal: Cardiovascular complication will be avoided Outcome: Not Progressing   Problem: Activity: Goal: Risk for activity intolerance will decrease Outcome: Not Progressing   Problem: Nutrition: Goal: Adequate nutrition will be maintained Outcome: Not Progressing   Problem: Coping: Goal: Level of anxiety will decrease Outcome: Not Progressing   Problem: Elimination: Goal: Will not experience complications related to bowel motility Outcome: Not Progressing Goal: Will not experience complications related to urinary retention Outcome: Not Progressing   Problem: Pain Managment: Goal: General experience of comfort will improve Outcome: Not Progressing   Problem: Safety: Goal: Ability to remain free from injury will improve Outcome: Not Progressing   Problem: Skin Integrity: Goal: Risk for impaired skin integrity will decrease Outcome: Not Progressing   Problem: Education: Goal: Ability to describe self-care measures that may prevent or decrease complications (Diabetes Survival Skills Education) will improve Outcome: Not Progressing Goal: Individualized Educational Video(s) Outcome: Not  Progressing   Problem: Coping: Goal: Ability to adjust to condition or change in health will improve Outcome: Not Progressing   Problem: Fluid Volume: Goal: Ability to maintain a balanced intake and output will improve Outcome: Not Progressing   Problem: Health Behavior/Discharge Planning: Goal: Ability to identify and utilize available resources and services will improve Outcome: Not Progressing Goal: Ability to manage health-related needs will improve Outcome: Not Progressing   Problem: Metabolic: Goal: Ability to maintain appropriate glucose levels will improve Outcome: Not Progressing   Problem: Nutritional: Goal: Maintenance of adequate nutrition will improve Outcome: Not Progressing Goal: Progress toward achieving an optimal weight will improve Outcome: Not Progressing   Problem: Skin Integrity: Goal: Risk for impaired skin integrity will decrease Outcome: Not Progressing   Problem: Tissue Perfusion: Goal: Adequacy of tissue perfusion will improve Outcome: Not Progressing   Problem: Education: Goal: Ability to describe self-care measures that may prevent or decrease complications (Diabetes Survival Skills Education) will improve Outcome: Not Progressing Goal: Individualized Educational Video(s) Outcome: Not Progressing   Problem: Cardiac: Goal: Ability to maintain an adequate cardiac output will improve Outcome: Not Progressing   Problem: Health Behavior/Discharge Planning: Goal: Ability to identify and utilize available resources and services will improve Outcome: Not Progressing Goal: Ability to manage health-related needs will improve Outcome: Not Progressing   Problem: Fluid Volume: Goal: Ability to achieve a balanced intake and output will improve Outcome: Not Progressing   Problem: Metabolic: Goal: Ability to maintain appropriate glucose levels will improve Outcome: Not Progressing   Problem: Nutritional: Goal: Maintenance of adequate nutrition  will improve Outcome: Not Progressing Goal: Maintenance of adequate weight for body size and type will improve Outcome: Not Progressing   Problem: Respiratory: Goal: Will regain and/or maintain adequate ventilation Outcome: Not Progressing   Problem: Urinary Elimination: Goal: Ability to achieve and maintain adequate renal perfusion and functioning will improve Outcome: Not Progressing

## 2022-09-07 NOTE — Progress Notes (Signed)
Levophed gtt stopped at 0837 due to resolving BP's. Will make MD aware on rounds, will continue to monitor.

## 2022-09-08 DIAGNOSIS — R578 Other shock: Secondary | ICD-10-CM | POA: Diagnosis not present

## 2022-09-08 DIAGNOSIS — K219 Gastro-esophageal reflux disease without esophagitis: Secondary | ICD-10-CM | POA: Diagnosis not present

## 2022-09-08 DIAGNOSIS — D62 Acute posthemorrhagic anemia: Secondary | ICD-10-CM

## 2022-09-08 DIAGNOSIS — N400 Enlarged prostate without lower urinary tract symptoms: Secondary | ICD-10-CM | POA: Diagnosis not present

## 2022-09-08 DIAGNOSIS — E1100 Type 2 diabetes mellitus with hyperosmolarity without nonketotic hyperglycemic-hyperosmolar coma (NKHHC): Secondary | ICD-10-CM | POA: Diagnosis not present

## 2022-09-08 DIAGNOSIS — I2510 Atherosclerotic heart disease of native coronary artery without angina pectoris: Secondary | ICD-10-CM | POA: Diagnosis not present

## 2022-09-08 DIAGNOSIS — K625 Hemorrhage of anus and rectum: Secondary | ICD-10-CM | POA: Diagnosis not present

## 2022-09-08 DIAGNOSIS — N179 Acute kidney failure, unspecified: Secondary | ICD-10-CM | POA: Diagnosis not present

## 2022-09-08 LAB — CBC
HCT: 27.4 % — ABNORMAL LOW (ref 39.0–52.0)
Hemoglobin: 8.7 g/dL — ABNORMAL LOW (ref 13.0–17.0)
MCH: 28.2 pg (ref 26.0–34.0)
MCHC: 31.8 g/dL (ref 30.0–36.0)
MCV: 89 fL (ref 80.0–100.0)
Platelets: 234 10*3/uL (ref 150–400)
RBC: 3.08 MIL/uL — ABNORMAL LOW (ref 4.22–5.81)
RDW: 14.6 % (ref 11.5–15.5)
WBC: 9.5 10*3/uL (ref 4.0–10.5)
nRBC: 0.4 % — ABNORMAL HIGH (ref 0.0–0.2)

## 2022-09-08 LAB — GLUCOSE, CAPILLARY
Glucose-Capillary: 82 mg/dL (ref 70–99)
Glucose-Capillary: 109 mg/dL — ABNORMAL HIGH (ref 70–99)
Glucose-Capillary: 115 mg/dL — ABNORMAL HIGH (ref 70–99)
Glucose-Capillary: 109 mg/dL — ABNORMAL HIGH (ref 70–99)

## 2022-09-08 NOTE — H&P (View-Only) (Signed)
I spoke with son regarding plans for EGD on 9/19 by Dr. Abbey Chatters. He is aware of risks and benefits. Telephone consent will need to be obtained. I have relayed this to nursing staff.  Annitta Needs, PhD, ANP-BC [facility] Gastroenter[facility]

## 2022-09-08 NOTE — Progress Notes (Signed)
I spoke with son regarding plans for EGD on 9/19 by Dr. Abbey Chatters. He is aware of risks and benefits. Telephone consent will need to be obtained. I have relayed this to nursing staff.  Annitta Needs, PhD, ANP-BC [facility] Gastroenterology

## 2022-09-08 NOTE — Progress Notes (Signed)
Telephone consent obtained from son. 2 RN verification of verbal consent for EGD on 9/19.

## 2022-09-08 NOTE — Progress Notes (Signed)
Gastroenterology Progress Note   Referring Provider: No ref. provider found Primary Care Physician:  Jilda Panda, MD Primary Gastroenterologist:  Dr.  Patient ID: Randy [name]; [id number]; 1941-09-02    Subjective   No overt GI bleeding. No abdominal pain. Ate only small amount of applesauce today per nursing.    Objective   Vital signs in last 24 hours Temp:  [98.1 F (36.7 C)-99.1 F (37.3 C)] 98.3 F (36.8 C) (09/18 0800) Pulse Rate:  [58-126] 59 (09/18 0600) Resp:  [10-30] 18 (09/18 0600) BP: (76-175)/(27-142) 96/40 (09/18 0600) SpO2:  [38 %-100 %] 100 % (09/18 0600) Weight:  [49.5 kg] 49.5 kg (09/18 0500) Last BM Date : 09/05/22  Physical Exam General:   Alert and oriented to person. Pleasant. No distress.  Head:  Normocephalic and atraumatic. Abdomen:  Bowel sounds present, soft, non-tender, non-distended.  Extremities:  Without edema.  Intake/Output from previous day: 09/17 0701 - 09/18 0700 In: 1824.9 [P.O.:200; I.V.:924.9; IV Piggyback:700] Out: 1000 [Urine:1000] Intake/Output this shift: Total I/O In: 105.9 [I.V.:105.9] Out: -   Lab Results  Recent Labs    09/06/22 1821 09/07/22 0350 09/08/22 0501  WBC 9.7 8.2 9.5  HGB 8.9* 8.4* 8.7*  HCT 27.4* 26.1* 27.4*  PLT 275 235 234   BMET Recent Labs    09/07/22 0350  NA 141  K 3.1*  CL 113*  CO2 23  GLUCOSE 162*  BUN 32*  CREATININE 1.81*  CALCIUM 7.0*    Studies/Results CT ANGIO GI BLEED  Result Date: 09/05/2022 CLINICAL DATA:  This impaction with rectal bleeding EXAM: CTA ABDOMEN AND PELVIS WITHOUT AND WITH CONTRAST TECHNIQUE: Multidetector CT imaging of the abdomen and pelvis was performed using the standard protocol during bolus administration of intravenous contrast. Multiplanar reconstructed images and MIPs were obtained and reviewed to evaluate the vascular anatomy. RADIATION DOSE REDUCTION: This exam was performed according to the departmental dose-optimization program which  includes automated exposure control, adjustment of the mA and/or kV according to patient size and/or use of iterative reconstruction technique. CONTRAST:  67m OMNIPAQUE IOHEXOL 350 MG/ML SOLN COMPARISON:  None Available. FINDINGS: VASCULAR Aorta: No evidence abdominal aortic aneurysm or dissection. Atherosclerotic calcifications. Patent. Celiac: Patent.  Atherosclerotic calcifications at the origin. SMA: Patent.  Atherosclerotic calcifications at the origin. Renals: Patent. Atherosclerotic calcifications the origin on the right. IMA: Patent. Inflow: Patent bilaterally.  Atherosclerotic calcifications. Proximal Outflow: Patent bilaterally. Veins: Within normal limits. Review of the MIP images confirms the above findings. NON-VASCULAR Lower chest: Lung bases are clear. Hepatobiliary: Subcentimeter cyst in segment 4A (series 15/image 10), benign. No follow-up is recommended. Scattered calcified granulomata. Gallbladder is unremarkable. No intrahepatic or extrahepatic ductal dilatation. Pancreas: Within normal limits. Spleen: Calcified splenic granulomata. Adrenals/Urinary Tract: Adrenal glands are within normal limits. Kidneys are within normal limits. No hydronephrosis. Thick-walled bladder, likely reflecting chronic bladder outlet obstruction. Stomach/Bowel: Stomach is within normal limits. No evidence of bowel obstruction. Normal appendix (series 15/image 47). No bowel wall thickening or inflammatory changes. Specifically, no rectal wall thickening or mass is evident on CT. No active extravasation of contrast following contrast administration to suggest active GI bleeding. Lymphatic: No suspicious abdominopelvic lymphadenopathy. Reproductive: Prostatomegaly, indenting the base of the bladder, reflecting BPH. Other: No abdominopelvic ascites. Musculoskeletal: Degenerative changes of the lumbar spine. Grade 1 spondylolisthesis at L5-S1. Mild superior endplate changes with Smalls node deformity at L4, likely chronic.  IMPRESSION: No evidence of active GI bleeding. No rectal wall thickening or mass is evident on CT. BPH with chronic  bladder outlet obstruction. Patent abdominal vasculature. Additional ancillary findings as above. Electronically Signed   By: Julian Hy M.D.   On: 09/05/2022 19:14   DG CHEST PORT 1 VIEW  Result Date: 09/05/2022 CLINICAL DATA:  Placement of central venous catheter EXAM: PORTABLE CHEST 1 VIEW COMPARISON:  Previous studies including the examination of 09/03/2022 FINDINGS: Transverse diameter of heart is increased. There are no signs of pulmonary edema or focal pulmonary consolidation. There is no pleural effusion or pneumothorax. There is interval placement of right IJ central venous catheter with its tip in superior vena cava. IMPRESSION: Tip of right IJ central venous catheter is seen in superior vena cava. There is no pneumothorax. Electronically Signed   By: Elmer Picker M.D.   On: 09/05/2022 18:10   [place] EKG SITE RITE  Result Date: 09/05/2022 If Site Rite image not attached, placement could not be confirmed due to current cardiac rhythm.  CT Head Wo Contrast  Result Date: 09/03/2022 CLINICAL DATA:  Altered mental status EXAM: CT HEAD WITHOUT CONTRAST TECHNIQUE: Contiguous axial images were obtained from the base of the skull through the vertex without intravenous contrast. RADIATION DOSE REDUCTION: This exam was performed according to the departmental dose-optimization program which includes automated exposure control, adjustment of the mA and/or kV according to patient size and/or use of iterative reconstruction technique. COMPARISON:  None Available. FINDINGS: Brain: No acute intracranial findings are seen. There are no signs of bleeding within the cranium. Minimal calcifications are seen in basal ganglia. Cortical sulci are prominent. There is small area of encephalomalacia in the right frontal cortex. There is decreased density in periventricular and subcortical white  matter. Vascular: Scattered arterial calcifications are seen. Skull: Unremarkable. Sinuses/Orbits: Unremarkable. Other: None. IMPRESSION: No acute intracranial findings are seen in noncontrast CT brain. Atrophy. 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 Electronically Signed   By: Elmer Picker M.D.   On: 09/03/2022 15:24   DG Chest Port 1 View  Result Date: 09/03/2022 CLINICAL DATA:  Altered mental status EXAM: PORTABLE CHEST 1 VIEW COMPARISON:  Radiograph 12/30/2021 FINDINGS: Unchanged cardiomediastinal silhouette. There is no focal airspace consolidation. There is no pleural effusion. No pneumothorax. There is no acute osseous abnormality. Unchanged mildly elevated right hemidiaphragm. IMPRESSION: No evidence of acute cardiopulmonary disease. Unchanged mildly elevated right hemidiaphragm Electronically Signed   By: Maurine Simmering M.D.   On: 09/03/2022 13:50    Assessment  81 y.o. male with a history of chronic heart failure, HTN, CAD, GERD, BPH, diabetes, admitted with acute metabolic encephalopathy with multifactorial etiologies, with GI consulted due to acute GI bleed with hemorrhagic shock during hospitalization.  GI bleed: required pressor support but now has been discontinued as of yesterday morning. 2 units PRBCs received this admission. Hgb remaining stable over past 48 hours.CTA without source for active bleeding. He has had no further overt GI bleeding per nursing staff. Last EGD in Dec 2022 normal by Dr. Loletha Carrow. Plavix and aspirin on hold. Discussed with Dr. Dyann Kief regarding timing of resuming aspirin and Plavix. Will discuss further with Dr. Abbey Chatters. Unclear if patient will be able to adequately prep for a colonoscopy.      Plan / Recommendations  Continue supportive care PPI BID Further recommendations to follow    LOS: 5 days    09/08/2022, 8:36 AM  Annitta Needs, PhD, ANP-BC [facility] Gastroenterology

## 2022-09-08 NOTE — Progress Notes (Signed)
Patient transferred to 315 in stable condition. Report given to Joellen Jersey, Therapist, sports.

## 2022-09-08 NOTE — Evaluation (Signed)
Clinical/Bedside Swallow Evaluation Patient Details  Name: Randy [name] MRN: [id number] Date of Birth: 03/23/1941  Today's Date: 09/08/2022 Time: SLP Start Time (ACUTE ONLY): 53 SLP Stop Time (ACUTE ONLY): 1525 SLP Time Calculation (min) (ACUTE ONLY): 21 min  Past Medical History:  Past Medical History:  Diagnosis Date   Acute systolic CHF (congestive heart failure) ([place]) 11/16/2021   CAD, 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   Hearing loss    HTN (hypertension)    Past Surgical History:  Past Surgical History:  Procedure Laterality Date   ESOPHAGOGASTRODUODENOSCOPY (EGD) WITH PROPOFOL N/A 11/26/2021   Procedure: ESOPHAGOGASTRODUODENOSCOPY (EGD) WITH PROPOFOL;  Surgeon: Doran Stabler, MD;  Location: [place];  Service: Gastroenterology;  Laterality: N/A;   EYE SURGERY     IABP INSERTION N/A 01/30/2021   Procedure: IABP Insertion;  Surgeon: Leonie Man, MD;  Location: [place] [facility];  Service: Cardiovascular;  Laterality: N/A;   RIGHT/LEFT HEART CATH AND CORONARY ANGIOGRAPHY N/A 01/30/2021   Procedure: RIGHT/LEFT HEART CATH AND CORONARY ANGIOGRAPHY;  Surgeon: Leonie Man, MD;  Location: [place] [facility];  Service: Cardiovascular;  Laterality: N/A;   HPI:  Randy [name] is a 81 y.o. male with medical history significant of chronic systolic heart failure, hypertension, hyperlipidemia, coronary artery disease, GERD and BPH; who was brought to the hospital secondary to change in mentation and elevated blood sugar. CT head demonstrated no acute intracranial abnormalities; blood work suggesting the presence of hyperosmolar hyperglycemic crisis with blood sugar in the 970 range.  Normal bicarb and worsening renal function.  No acute signs of infection appreciated.GI bleed: required pressor support but now has been discontinued as of yesterday morning. 2 units PRBCs received this admission. Hgb remaining stable over past 48 hours.CTA without source for active bleeding. He has  had no further overt GI bleeding per nursing staff. Last EGD in Dec 2022 normal by Dr. Loletha Carrow. BSE requested. PT scheduled for EGD tomorrow.    Assessment / Plan / Recommendation  Clinical Impression  Clinical swallow evaluation completed at bedside. Oral motor examination is WNL with the exception of Pt missing most dentition. Pt agreeable to small amount of PO, but then states, "no more'. Pt consumed ice chips, thin water via cup/straw and puree with only one cough over course of trials. Pt with poor intake overall. He is scheduled for EGD tomorrow and baseline diet is reportedly puree and thin. Recommend D1/puree and thin liquids and SLP will follow after EGD. Above to RN, ok for medication whole with thin or in puree. SLP Visit Diagnosis: Dysphagia, unspecified (R13.10)    Aspiration Risk  Mild aspiration risk    Diet Recommendation Dysphagia 1 (Puree);Thin liquid   Liquid Administration via: Cup;Straw Medication Administration: Whole meds with liquid Supervision: Staff to assist with self feeding Compensations: Slow rate;Small sips/bites Postural Changes: Seated upright at 90 degrees;Remain upright for at least 30 minutes after po intake    Other  Recommendations Oral Care Recommendations: Oral care BID;Staff/trained caregiver to provide oral care Other Recommendations: Clarify dietary restrictions    Recommendations for follow up therapy are one component of a multi-disciplinary discharge planning process, led by the attending physician.  Recommendations may be updated based on patient status, additional functional criteria and insurance authorization.  Follow up Recommendations Skilled nursing-short term rehab (<3 hours/day)      Assistance Recommended at Discharge Frequent or constant Supervision/Assistance  Functional Status Assessment Patient has had a recent decline in their functional status and demonstrates the ability to  make significant improvements in function in a  reasonable and predictable amount of time.  Frequency and Duration min 2x/week  1 week       Prognosis Prognosis for Safe Diet Advancement: Fair      Swallow Study   General Date of Onset: 09/03/22 HPI: Randy [name] is a 81 y.o. male with medical history significant of chronic systolic heart failure, hypertension, hyperlipidemia, coronary artery disease, GERD and BPH; who was brought to the hospital secondary to change in mentation and elevated blood sugar. CT head demonstrated no acute intracranial abnormalities; blood work suggesting the presence of hyperosmolar hyperglycemic crisis with blood sugar in the 970 range.  Normal bicarb and worsening renal function.  No acute signs of infection appreciated.GI bleed: required pressor support but now has been discontinued as of yesterday morning. 2 units PRBCs received this admission. Hgb remaining stable over past 48 hours.CTA without source for active bleeding. He has had no further overt GI bleeding per nursing staff. Last EGD in Dec 2022 normal by Dr. Loletha Carrow. BSE requested. PT scheduled for EGD tomorrow. Type of Study: Bedside Swallow Evaluation Diet Prior to this Study: Nectar-thick liquids;Dysphagia 1 (puree) Temperature Spikes Noted: No Respiratory Status: Room air History of Recent Intubation: No Behavior/Cognition: Alert;Cooperative;Pleasant mood Oral Cavity Assessment: Within Functional Limits (coated tongue) Oral Care Completed by SLP: Yes Oral Cavity - Dentition: Missing dentition (only has a few teeth) Vision: Functional for self-feeding Self-Feeding Abilities: Needs assist Patient Positioning: Upright in bed Baseline Vocal Quality: Normal Volitional Cough: Strong Volitional Swallow: Able to elicit    Oral/Motor/Sensory Function Overall Oral Motor/Sensory Function: Within functional limits   Ice Chips Ice chips: Within functional limits Presentation: Spoon   Thin Liquid Thin Liquid: Within functional limits Presentation:  Cup;Straw Other Comments:  (one delayed cough)    Nectar Thick Nectar Thick Liquid: Within functional limits Presentation: Cup   Honey Thick Honey Thick Liquid: Not tested   Puree Puree: Within functional limits Presentation: Spoon   Solid     Solid: Not tested Presentation:  (baseline diet is puree per report)     Thank you,  Genene Churn, [place] 09/08/2022,3:43 PM

## 2022-09-08 NOTE — Progress Notes (Addendum)
Progress Note   Patient: Randy [name] [id number]:[id number] DOB: 1941/09/10 DOA: 09/03/2022     5 DOS: the patient was seen and examined on 09/08/2022   Brief hospital course: Randy [name] is a 81 y.o. male with medical history significant of chronic systolic heart failure, hypertension, hyperlipidemia, coronary artery disease, GERD and BPH; who was brought to the hospital secondary to change in mentation and elevated blood sugar. Patient mentation not allowing for him to participate in interview; currently agitated confused and not following commands. CT head demonstrated no acute intracranial abnormalities; blood work suggesting the presence of hyperosmolar hyperglycemic crisis with blood sugar in the 970 range.  Normal bicarb and worsening renal function. No acute signs of infection appreciated.   IV fluids, insulin therapy initiated.  TRH contacted to place patient in the hospital for further evaluation and management.  Assessment and Plan: Acute metabolic encephalopathy -Multifactorial: In the setting of hypernatremia, hyperglycemic state, uremia/worsening renal function and dehydration -Continue IV fluids (rate adjusted to prevent HF exacerbation and congestion). -Continue supportive care. -Continue constant reorientation and continue the use of as needed Haldol. -Follow clinical response.  * 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 ([place]) -CBGs in the 170-280 range -Currently unable to eat or drink spontaneously -Hyponatremia currently resolved; IV fluids rates will be adjusted, patient's diet advance and will continue to follow CBGs to further adjust hypoglycemic regimen. -Overall hyperosmolar state corrected.  Hemorrhagic shock ([place]) -acute blood loss anemia In the setting of GI bleed. 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 -Currently hemodynamically stable and not requiring pressors support. -After transfusion hemoglobin up to 8.7 -No further acute  bleeding appreciated -Will continue to follow clinical response.  Gastrointestinal hemorrhage - No clear source of GI bleed identified at this moment -Continue conservative management -GI service on board; will follow recommendations.  -Planning for EGD on 09/09/2022.  -Continue PPI.  BPH (benign prostatic hyperplasia) -Continue holding Flomax and Proscar in the setting of low blood pressure -No signs of urinary retention currently. -Close monitoring for any symptoms.  GERD (gastroesophageal reflux disease) -Continue PPI. -Dose adjusted to twice a day and given through his veins. -Due to acute GI bleed GI service has been consulted and will follow recommendations. -CT angiogram negative identifying acute source of bleeding.  Bleeding episodes reported -Stable hemoglobin after transfusion; continue to follow trend. -Heparin for DVT prophylaxis discontinued; SCDs in place -Continue holding aspirin and Plavix currently. -Diet advance to be advanced to dysphagia 1 with nectar thick liquids.  Chronic systolic CHF (congestive heart failure) (HCC) -Stable and currently compensated -Continue to follow daily weights and strict I's and O's -Continue judicious fluid resuscitation as patient presented with hyperosmolar hyperglycemic crisis and hyponatremia. -Continue holding diuretics initially given worsening renal function.  Mixed hyperlipidemia -Resume the use of statins when able to tolerate p.o.'s.  Primary hypertension -Blood pressure soft but currently stable and has no longer required need for pressor support.   -Continue to follow vital signs. -Continue holding antihypertensive agents at this point. -Appear to be secondary to GI bleed/hypovolemic shock. -Continue to follow hemoglobin trend and transfuse as needed.   CAD, multiple vessel -No chest pain or shortness of breath currently -Continue home medication once blood pressure stabilized and continue risk factor  modification -Continue telemetry monitoring.  AKI-CKD 3B -In the setting of dehydration -Continue to hold nephrotoxic agents -Continue IV fluid resuscitation -Continue to follow renal function trend. -Creatinine 1.8; pretty much back to his baseline at this moment. -Continue adjusting IV  fluids rate.   Subjective:  No overnight events; hemodynamically stable.  No longer requiring pressors and without signs of active bleeding "  Physical Exam: Vitals:   09/08/22 0600 09/08/22 0800 09/08/22 0900 09/08/22 1200  BP: (!) 96/40  130/67   Pulse: (!) 59  81   Resp: 18  (!) 23   Temp:  98.3 F (36.8 C)  97.8 F (36.6 C)  TempSrc:  Axillary  Axillary  SpO2: 100%  100%   Weight:      Height:       General exam: Alert, awake, oriented x 2; following commands.  No fever, no chest pain, no nausea vomiting.  Decreased oral intake and no further episodes of bleeding appreciated. Respiratory system: Clear to auscultation. Respiratory effort normal.  Good saturation on room air. Cardiovascular system:RRR. No rubs or gallops; no JVD. Gastrointestinal system: Abdomen is nondistended, soft and nontender. No organomegaly or masses felt. Normal bowel sounds heard. Central nervous system: Alert and oriented. No focal neurological deficits. Extremities: No cyanosis or clubbing. Skin: No petechiae. Psychiatry: Judgement and insight appear normal.  Stable mood currently.  Data Reviewed: CBGs: In the 87-110 range. CBC: WBCs 9.5, hemoglobin 8.7, platelet count 234 K  Family Communication: No family at bedside. Disposition: Status is: Inpatient Remains inpatient appropriate because: Still requiring IV therapy; mentation now back to baseline the most part.  Planning endoscopic evaluation on 09/09/2022.   Planned Discharge Destination: Skilled nursing facility long-term resident.   Author: Barton Dubois, MD 09/08/2022 2:55 PM  For on call review [email or web address].

## 2022-09-09 ENCOUNTER — Inpatient Hospital Stay (HOSPITAL_COMMUNITY): Payer: Medicare Other | Admitting: Anesthesiology

## 2022-09-09 ENCOUNTER — Encounter (HOSPITAL_COMMUNITY): Payer: Self-pay | Admitting: Internal Medicine

## 2022-09-09 ENCOUNTER — Encounter (HOSPITAL_COMMUNITY)
Admission: EM | Disposition: A | Discharge status: Skilled Nursing Facility | Payer: Self-pay | Source: Home / Self Care | Attending: Internal Medicine

## 2022-09-09 DIAGNOSIS — E1100 Type 2 diabetes mellitus with hyperosmolarity without nonketotic hyperglycemic-hyperosmolar coma (NKHHC): Secondary | ICD-10-CM | POA: Diagnosis not present

## 2022-09-09 DIAGNOSIS — K254 Chronic or unspecified gastric ulcer with hemorrhage: Secondary | ICD-10-CM

## 2022-09-09 DIAGNOSIS — K2981 Duodenitis with bleeding: Secondary | ICD-10-CM

## 2022-09-09 DIAGNOSIS — E876 Hypokalemia: Secondary | ICD-10-CM

## 2022-09-09 DIAGNOSIS — E1165 Type 2 diabetes mellitus with hyperglycemia: Secondary | ICD-10-CM

## 2022-09-09 DIAGNOSIS — Z7984 Long term (current) use of oral hypoglycemic drugs: Secondary | ICD-10-CM

## 2022-09-09 DIAGNOSIS — N179 Acute kidney failure, unspecified: Secondary | ICD-10-CM | POA: Diagnosis not present

## 2022-09-09 DIAGNOSIS — D62 Acute posthemorrhagic anemia: Secondary | ICD-10-CM | POA: Diagnosis not present

## 2022-09-09 DIAGNOSIS — N400 Enlarged prostate without lower urinary tract symptoms: Secondary | ICD-10-CM | POA: Diagnosis not present

## 2022-09-09 DIAGNOSIS — K449 Diaphragmatic hernia without obstruction or gangrene: Secondary | ICD-10-CM

## 2022-09-09 HISTORY — PX: ESOPHAGOGASTRODUODENOSCOPY (EGD) WITH PROPOFOL: SHX5813

## 2022-09-09 HISTORY — PX: BIOPSY: SHX5522

## 2022-09-09 LAB — CBC
HCT: 33.6 % — ABNORMAL LOW (ref 39.0–52.0)
HCT: 26.9 % — ABNORMAL LOW (ref 39.0–52.0)
HCT: 32.7 % — ABNORMAL LOW (ref 39.0–52.0)
HCT: 25.9 % — ABNORMAL LOW (ref 39.0–52.0)
Hemoglobin: 10.2 g/dL — ABNORMAL LOW (ref 13.0–17.0)
Hemoglobin: 7.7 g/dL — ABNORMAL LOW (ref 13.0–17.0)
Hemoglobin: 10.7 g/dL — ABNORMAL LOW (ref 13.0–17.0)
Hemoglobin: 8.2 g/dL — ABNORMAL LOW (ref 13.0–17.0)
MCH: 27.2 pg (ref 26.0–34.0)
MCH: 26.8 pg (ref 26.0–34.0)
MCH: 28.5 pg (ref 26.0–34.0)
MCH: 28.4 pg (ref 26.0–34.0)
MCHC: 30.4 g/dL (ref 30.0–36.0)
MCHC: 28.6 g/dL — ABNORMAL LOW (ref 30.0–36.0)
MCHC: 32.7 g/dL (ref 30.0–36.0)
MCHC: 31.7 g/dL (ref 30.0–36.0)
MCV: 89.6 fL (ref 80.0–100.0)
MCV: 93.7 fL (ref 80.0–100.0)
MCV: 87.2 fL (ref 80.0–100.0)
MCV: 89.6 fL (ref 80.0–100.0)
Platelets: 431 10*3/uL — ABNORMAL HIGH (ref 150–400)
Platelets: 350 10*3/uL (ref 150–400)
Platelets: 316 10*3/uL (ref 150–400)
Platelets: 277 10*3/uL (ref 150–400)
RBC: 3.75 MIL/uL — ABNORMAL LOW (ref 4.22–5.81)
RBC: 2.87 MIL/uL — ABNORMAL LOW (ref 4.22–5.81)
RBC: 3.75 MIL/uL — ABNORMAL LOW (ref 4.22–5.81)
RBC: 2.89 MIL/uL — ABNORMAL LOW (ref 4.22–5.81)
RDW: 15.4 % (ref 11.5–15.5)
RDW: 15.4 % (ref 11.5–15.5)
RDW: 14.7 % (ref 11.5–15.5)
RDW: 14.7 % (ref 11.5–15.5)
WBC: 17.7 10*3/uL — ABNORMAL HIGH (ref 4.0–10.5)
WBC: 12.7 10*3/uL — ABNORMAL HIGH (ref 4.0–10.5)
WBC: 10.9 10*3/uL — ABNORMAL HIGH (ref 4.0–10.5)
WBC: 5.6 10*3/uL (ref 4.0–10.5)
nRBC: 0.3 % — ABNORMAL HIGH (ref 0.0–0.2)
nRBC: 0.9 % — ABNORMAL HIGH (ref 0.0–0.2)
nRBC: 0.8 % — ABNORMAL HIGH (ref 0.0–0.2)
nRBC: 0.7 % — ABNORMAL HIGH (ref 0.0–0.2)

## 2022-09-09 LAB — BASIC METABOLIC PANEL
Anion gap: 7 (ref 5–15)
BUN: 15 mg/dL (ref 8–23)
CO2: 19 mmol/L — ABNORMAL LOW (ref 22–32)
Calcium: 7.2 mg/dL — ABNORMAL LOW (ref 8.9–10.3)
Chloride: 115 mmol/L — ABNORMAL HIGH (ref 98–111)
Creatinine, Ser: 1.71 mg/dL — ABNORMAL HIGH (ref 0.61–1.24)
GFR, Estimated: 39 mL/min — ABNORMAL LOW (ref >60)
Glucose, Bld: 63 mg/dL — ABNORMAL LOW (ref 70–99)
Potassium: 3.2 mmol/L — ABNORMAL LOW (ref 3.5–5.1)
Sodium: 141 mmol/L (ref 135–145)

## 2022-09-09 LAB — GLUCOSE, CAPILLARY
Glucose-Capillary: 50 mg/dL — ABNORMAL LOW (ref 70–99)
Glucose-Capillary: 102 mg/dL — ABNORMAL HIGH (ref 70–99)
Glucose-Capillary: 156 mg/dL — ABNORMAL HIGH (ref 70–99)
Glucose-Capillary: 74 mg/dL (ref 70–99)
Glucose-Capillary: 135 mg/dL — ABNORMAL HIGH (ref 70–99)

## 2022-09-09 SURGERY — ESOPHAGOGASTRODUODENOSCOPY (EGD) WITH PROPOFOL
Anesthesia: General

## 2022-09-09 MED ORDER — PROPOFOL 10 MG/ML IV BOLUS
INTRAVENOUS | Status: DC | PRN
  Administered 2022-09-09 (×2): 20 mg via INTRAVENOUS
  Administered 2022-09-09: 60 mg via INTRAVENOUS

## 2022-09-09 MED ORDER — DEXTROSE 50 % IV SOLN: 25.0000 g | INTRAVENOUS | Status: DC

## 2022-09-09 MED ORDER — POTASSIUM CHLORIDE 10 MEQ/100ML IV SOLN
10.0000 meq | INTRAVENOUS | Status: AC
  Administered 2022-09-09: 10 meq via INTRAVENOUS
  Filled 2022-09-09: qty 100

## 2022-09-09 MED ORDER — LACTATED RINGERS IV SOLN: INTRAVENOUS | Status: DC | PRN

## 2022-09-09 MED ORDER — EPHEDRINE SULFATE (PRESSORS) 50 MG/ML IJ SOLN
INTRAMUSCULAR | Status: DC | PRN
  Administered 2022-09-09 (×2): 10 mg via INTRAVENOUS

## 2022-09-09 MED ORDER — DEXTROSE 50 % IV SOLN
1.0000 | Freq: Once | INTRAVENOUS | Status: DC
  Filled 2022-09-09: qty 50

## 2022-09-09 MED ORDER — LIDOCAINE HCL (CARDIAC) PF 100 MG/5ML IV SOSY
PREFILLED_SYRINGE | INTRAVENOUS | Status: DC | PRN
  Administered 2022-09-09: 50 mg via INTRAVENOUS

## 2022-09-09 MED ORDER — DEXTROSE 50 % IV SOLN
INTRAVENOUS | Status: AC
  Administered 2022-09-09: 50 mL
  Filled 2022-09-09: qty 50

## 2022-09-09 NOTE — Anesthesia Preprocedure Evaluation (Signed)
Anesthesia Evaluation  Patient identified by MRN, date of birth, ID band Patient awake    Reviewed: Allergy & Precautions, H&P , NPO status , Patient's Chart, lab work & pertinent test results, reviewed documented beta blocker date and time   Airway Mallampati: II  TM Distance: >3 FB Neck ROM: full    Dental no notable dental hx.    Pulmonary neg pulmonary ROS,    Pulmonary exam normal breath sounds clear to auscultation       Cardiovascular Exercise Tolerance: Good hypertension, + CAD and +CHF   Rhythm:regular Rate:Normal     Neuro/Psych negative neurological ROS  negative psych ROS   GI/Hepatic Neg liver ROS, GERD  Medicated,  Endo/Other  diabetes, Poorly Controlled, Type 2  Renal/GU ARF and CRFRenal disease  negative genitourinary   Musculoskeletal   Abdominal   Peds  Hematology  (+) Blood dyscrasia, anemia ,   Anesthesia Other Findings   Reproductive/Obstetrics negative OB ROS                             Anesthesia Physical Anesthesia Plan  ASA: 4 and emergent  Anesthesia Plan: General   Post-op Pain Management:    Induction:   PONV Risk Score and Plan: Propofol infusion  Airway Management Planned:   Additional Equipment:   Intra-op Plan:   Post-operative Plan:   Informed Consent: I have reviewed the patients History and Physical, chart, labs and discussed the procedure including the risks, benefits and alternatives for the proposed anesthesia with the patient or authorized representative who has indicated his/her understanding and acceptance.     Dental Advisory Given  Plan Discussed with: CRNA  Anesthesia Plan Comments:         Anesthesia Quick Evaluation

## 2022-09-09 NOTE — Inpatient Diabetes Management (Signed)
Inpatient Diabetes Program Recommendations  AACE/ADA: New Consensus Statement on Inpatient Glycemic Control   Target Ranges:  Prepandial:   less than 140 mg/dL      Peak postprandial:   less than 180 mg/dL (1-2 hours)      Critically ill patients:  140 - 180 mg/dL    Latest Reference Range & Units 09/09/22 06:23  Glucose 70 - 99 mg/dL 63 (L)    Latest Reference Range & Units 09/08/22 07:39 09/08/22 11:38 09/08/22 16:02 09/08/22 20:20  Glucose-Capillary 70 - 99 mg/dL 82 109 (H) 115 (H) 109 (H)   Review of Glycemic Control  Diabetes history: DM2 Outpatient Diabetes medications: Jardiance 10 mg daily, Metformin 500 mg BID Current orders for Inpatient glycemic control:  Levemir 5 units QHS, Novolog 0-15 units TID with meals, Novolog 0-5 units QHS  Inpatient Diabetes Program Recommendations:    Insulin: Lab glucose 63 mg/dl today. Please consider decreasing Levemir to 3 units BID.  Thanks, Barnie Alderman, RN, MSN, [place] [facility] Coordinator Inpatient Diabetes Program [phone] (Team Pager from 8am to [place])

## 2022-09-09 NOTE — Progress Notes (Signed)
Hypoglycemic Event  CBG: 50  Treatment: D50 50 mL (25 gm)  Symptoms: None  Follow-up CBG: Time:0734 CBG Result:102   Possible Reasons for Event: Other: Pt is NPO  Comments/MD notified: [facility]

## 2022-09-09 NOTE — Interval H&P Note (Signed)
History and Physical Interval Note:  09/09/2022 3:02 PM  Randy [name]  has presented today for surgery, with the diagnosis of acute GI bleed.  The various methods of treatment have been discussed with the patient and family. After consideration of risks, benefits and other options for treatment, the patient has consented to  Procedure(s): ESOPHAGOGASTRODUODENOSCOPY (EGD) WITH PROPOFOL (N/A) as a surgical intervention.  The patient's history has been reviewed, patient examined, no change in status, stable for surgery.  I have reviewed the patient's chart and labs.  Questions were answered to the patient's satisfaction.     Eloise Harman

## 2022-09-09 NOTE — Op Note (Signed)
[facility] Patient Name: Randy [name] Procedure Date: 09/09/2022 2:57 PM MRN: [id number] Date of Birth: 03-06-1941 Attending MD: Elon Alas. Abbey Chatters DO CSN: [id number] Age: 81 Admit Type: Inpatient Procedure:                Upper GI endoscopy Indications:              Acute post hemorrhagic anemia, Hematochezia Providers:                Elon Alas. Abbey Chatters, DO, Lambert Mody, Lorin Picket, Technician Referring MD:              Medicines:                See the Anesthesia note for documentation of the                            administered medications Complications:            No immediate complications. Estimated Blood Loss:     Estimated blood loss was minimal. Procedure:                Pre-Anesthesia Assessment:                           - The anesthesia plan was to use monitored                            anesthesia care (MAC).                           After obtaining informed consent, the endoscope was                            passed under direct vision. Throughout the                            procedure, the patient's blood pressure, pulse, and                            oxygen saturations were monitored continuously. The                            [id number]-H190 ([id number]) scope was introduced through the                            mouth, and advanced to the second part of duodenum.                            The upper GI endoscopy was accomplished without                            difficulty. The patient tolerated the procedure                            well. Scope  In: 3:16:59 PM Scope Out: 3:21:41 PM Total Procedure Duration: 0 hours 4 minutes 42 seconds  Findings:      A small hiatal hernia was present.      There is no endoscopic evidence of bleeding, areas of erosion,       esophagitis, ulcerations or varices in the entire esophagus.      Patchy mild inflammation characterized by erythema was found in the        entire examined stomach. Biopsies were taken with a cold forceps for       Helicobacter pylori testing.      One non-bleeding cratered gastric ulcer with a clean ulcer base (Forrest       Class III) was found in the gastric fundus. The lesion was 15 mm in       largest dimension. Biopsies were taken with a cold forceps for histology.      The duodenal bulb, first portion of the duodenum and second portion of       the duodenum were normal. Impression:               - Small hiatal hernia.                           - Gastritis. Biopsied.                           - Non-bleeding gastric ulcer with a clean ulcer                            base (Forrest Class III). Biopsied.                           - Normal duodenal bulb, first portion of the                            duodenum and second portion of the duodenum. Moderate Sedation:      Per Anesthesia Care Recommendation:           - Return patient to hospital ward for ongoing care.                           - Mechanical soft diet.                           - Treat for H pylori if biopsies positive                           - BID PPI                           - Avoid all NSAIDs Procedure Code(s):        --- Professional ---                           [phone], Esophagogastroduodenoscopy, flexible,                            transoral; with biopsy, single or multiple Diagnosis Code(s):        --- Professional ---  K44.9, Diaphragmatic hernia without obstruction or                            gangrene                           K29.70, Gastritis, unspecified, without bleeding                           K25.9, Gastric ulcer, unspecified as acute or                            chronic, without hemorrhage or perforation                           D62, Acute posthemorrhagic anemia                           K92.1, Melena (includes Hematochezia) CPT copyright 2019 American Medical Association. All rights reserved. The codes  documented in this report are preliminary and upon coder review may  be revised to meet current compliance requirements. Elon Alas. Abbey Chatters, DO [employer] Abbey Chatters, DO 09/09/2022 3:25:37 PM This report has been signed electronically. Number of Addenda: 0

## 2022-09-09 NOTE — Plan of Care (Signed)

## 2022-09-09 NOTE — Progress Notes (Signed)
Progress Note   Patient: Randy [name] ZJI:[id number] DOB: [birth year] DOA: 09/03/2022     6 DOS: the patient was seen and examined on 09/09/2022   Brief hospital course: Randy [name] is a 81 y.o. male with medical history significant of chronic systolic heart failure, hypertension, hyperlipidemia, coronary artery disease, GERD and BPH; who was brought to the hospital secondary to change in mentation and elevated blood sugar. Patient mentation not allowing for him to participate in interview; currently agitated confused and not following commands. CT head demonstrated no acute intracranial abnormalities; blood work suggesting the presence of hyperosmolar hyperglycemic crisis with blood sugar in the 970 range.  Normal bicarb and worsening renal function. No acute signs of infection appreciated.   IV fluids, insulin therapy initiated.  TRH contacted to place patient in the hospital for further evaluation and management.  Assessment and Plan: Acute metabolic encephalopathy -Multifactorial: In the setting of hypernatremia, hyperglycemic state, uremia/worsening renal function and dehydration -Continue IV fluids (rate adjusted to prevent HF exacerbation and congestion). -Continue supportive care. -Continue constant reorientation and continue the use of as needed Haldol. -Follow clinical response.  * 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 ([place]) -CBGs in the 60 range earlier. -Treated with D50 amp -Currently n.p.o. for anticipated endoscopy; -Hypernatremia currently resolved; IV fluids rates adjusted, patient's diet advance and will continue to follow CBGs to further adjust hypoglycemic regimen. -Overall hyperosmolar state corrected.  Hypokalemia - Potassium 3.2 -Will replete and follow electrolytes trend.  Hemorrhagic shock ([place]) -acute blood loss anemia In the setting of GI bleed. 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 -Currently hemodynamically stable  and not requiring pressors support. -After transfusion hemoglobin up to 8.2 -No further acute bleeding appreciated -Will continue to follow clinical response.  Gastrointestinal hemorrhage - No clear source of GI bleed identified at this moment -Continue conservative management -GI service on board; will follow recommendations.  -Planning for EGD later today (09/09/2022).  -Continue PPI.  BPH (benign prostatic hyperplasia) -Continue holding Flomax and Proscar in the setting of low blood pressure -No signs of urinary retention currently. -Close monitoring for any symptoms.  GERD (gastroesophageal reflux disease) -Continue PPI. -Dose adjusted to twice a day and given through his veins. -Due to acute GI bleed GI service has been consulted and will follow recommendations. -CT angiogram negative identifying acute source of bleeding.  Bleeding episodes reported -Stable hemoglobin after transfusion; continue to follow trend. -Heparin for DVT prophylaxis discontinued; SCDs in place -Continue holding aspirin and Plavix currently. -Diet advance to be advanced to dysphagia 1 with nectar thick liquids.  Chronic systolic CHF (congestive heart failure) ([facility]) -Stable and currently compensated -Continue to follow daily weights and strict I's and O's -Continue judicious fluid resuscitation as patient presented with hyperosmolar hyperglycemic crisis and hyponatremia. -Continue holding diuretics initially given worsening renal function.  Mixed hyperlipidemia -Resume the use of statins when able to tolerate p.o.'s.  Primary hypertension -Blood pressure soft but currently stable and has no longer required need for pressor support.   -Continue to follow vital signs. -Continue holding antihypertensive agents at this point. -Appear to be secondary to GI bleed/hypovolemic shock. -Continue to follow hemoglobin trend and transfuse as needed.   CAD, multiple vessel -No chest pain or shortness of  breath currently -Continue home medication once blood pressure stabilized and continue risk factor modification -Continue telemetry monitoring.  AKI-CKD 3B -In the setting of dehydration -Continue to hold nephrotoxic agents -Continue IV fluid resuscitation -Continue to follow renal function trend. -Creatinine  1.7; pretty much back to his baseline at this moment. -Continue adjusting IV fluids rate.   Subjective:  No overnight events; no operative bleeding reported.  Following commands appropriately.  In no acute distress.  Physical Exam: Vitals:   09/09/22 0500 09/09/22 0540 09/09/22 0744 09/09/22 1252  BP:  (!) 113/51 (!) 109/51 (!) 101/55  Pulse:  (!) 51 88 (!) 59  Resp:  '16 18 18  '$ Temp:  (!) 97.1 F (36.2 C) 97.7 F (36.5 C) 97.7 F (36.5 C)  TempSrc:   Oral Oral  SpO2:  96% 90% 100%  Weight: 55.9 kg     Height:       General exam: Alert, awake, unable to follow simple commands.  Language barrier continue to be biggest challenge at this moment.  No agitation or overnight events reported.  No operative bleeding appreciated.  Patient denies chest pain, nausea, vomiting and shortness of breath.  Afebrile. Respiratory system: Clear to auscultation. Respiratory effort normal.  No using accessory muscle. Cardiovascular system:RRR. No rubs or gallops. Gastrointestinal system: Abdomen is nondistended, soft and nontender. No organomegaly or masses felt. Normal bowel sounds heard. Central nervous system: Alert and oriented. No focal neurological deficits. Extremities: No cyanosis or clubbing. Skin: No petechiae. Psychiatry: Judgement and insight appear stable for the most part; flat affect.  Data Reviewed: CBC: White blood cell 5.6, Hemoglobin 8.2 and Platelets count 341D Basic metabolic panel: Sodium 622, potassium 3.2, chloride 115, bicarb 19, blood glucose 63, BUN 15, creatinine 1.7  Family Communication: No family at bedside. Disposition: Status is: Inpatient Remains  inpatient appropriate because: Still requiring IV therapy; mentation now back to baseline the most part.  Planning endoscopic evaluation on 09/09/2022.   Planned Discharge Destination: Skilled nursing facility long-term resident.   Author: Barton Dubois, MD 09/09/2022 2:39 PM  For on call review [email or web address].

## 2022-09-09 NOTE — Transfer of Care (Signed)
Immediate Anesthesia Transfer of Care Note  Patient: Randy [name]  Procedure(s) Performed: ESOPHAGOGASTRODUODENOSCOPY (EGD) WITH PROPOFOL BIOPSY  Patient Location: PACU  Anesthesia Type:General  Level of Consciousness: awake and drowsy  Airway & Oxygen Therapy: Patient Spontanous Breathing and Patient connected to nasal cannula oxygen  Post-op Assessment: Report given to RN, Post -op Vital signs reviewed and stable and Patient moving all extremities X 4  Post vital signs: Reviewed and stable  Last Vitals:  Vitals Value Taken Time  BP 89/51 09/09/22 1534  Temp 36.6 C 09/09/22 1527  Pulse 65 09/09/22 1536  Resp 26 09/09/22 1536  SpO2 100 % 09/09/22 1536  Vitals shown include unvalidated device data.  Last Pain:  Vitals:   09/09/22 1527  TempSrc:   PainSc: Asleep      Patients Stated Pain Goal: 0 (31/42/76 7011)  Complications: No notable events documented.

## 2022-09-09 NOTE — Assessment & Plan Note (Signed)
-   Potassium 3.2 -Will replete and follow electrolytes trend.

## 2022-09-10 DIAGNOSIS — E1100 Type 2 diabetes mellitus with hyperosmolarity without nonketotic hyperglycemic-hyperosmolar coma (NKHHC): Secondary | ICD-10-CM | POA: Diagnosis not present

## 2022-09-10 LAB — BASIC METABOLIC PANEL
Anion gap: 6 (ref 5–15)
BUN: 10 mg/dL (ref 8–23)
CO2: 20 mmol/L — ABNORMAL LOW (ref 22–32)
Calcium: 7.6 mg/dL — ABNORMAL LOW (ref 8.9–10.3)
Chloride: 112 mmol/L — ABNORMAL HIGH (ref 98–111)
Creatinine, Ser: 1.57 mg/dL — ABNORMAL HIGH (ref 0.61–1.24)
GFR, Estimated: 43 mL/min — ABNORMAL LOW (ref >60)
Glucose, Bld: 89 mg/dL (ref 70–99)
Potassium: 3.4 mmol/L — ABNORMAL LOW (ref 3.5–5.1)
Sodium: 138 mmol/L (ref 135–145)

## 2022-09-10 LAB — GLUCOSE, CAPILLARY
Glucose-Capillary: 136 mg/dL — ABNORMAL HIGH (ref 70–99)
Glucose-Capillary: 121 mg/dL — ABNORMAL HIGH (ref 70–99)
Glucose-Capillary: 46 mg/dL — ABNORMAL LOW (ref 70–99)
Glucose-Capillary: 66 mg/dL — ABNORMAL LOW (ref 70–99)
Glucose-Capillary: 160 mg/dL — ABNORMAL HIGH (ref 70–99)
Glucose-Capillary: 166 mg/dL — ABNORMAL HIGH (ref 70–99)

## 2022-09-10 LAB — CBC
HCT: 28.5 % — ABNORMAL LOW (ref 39.0–52.0)
Hemoglobin: 9.3 g/dL — ABNORMAL LOW (ref 13.0–17.0)
MCH: 29 pg (ref 26.0–34.0)
MCHC: 32.6 g/dL (ref 30.0–36.0)
MCV: 88.8 fL (ref 80.0–100.0)
Platelets: 387 10*3/uL (ref 150–400)
RBC: 3.21 MIL/uL — ABNORMAL LOW (ref 4.22–5.81)
RDW: 14.7 % (ref 11.5–15.5)
WBC: 5.7 10*3/uL (ref 4.0–10.5)
nRBC: 1 % — ABNORMAL HIGH (ref 0.0–0.2)

## 2022-09-10 LAB — MAGNESIUM: Magnesium: 1.7 mg/dL (ref 1.7–2.4)

## 2022-09-10 MED ORDER — CARVEDILOL 3.125 MG PO TABS: 6.2500 mg | ORAL_TABLET | Freq: Two times a day (BID) | ORAL | 0 refills | Status: DC

## 2022-09-10 MED ORDER — BUMETANIDE 1 MG PO TABS: 1.0000 mg | ORAL_TABLET | Freq: Every day | ORAL | 3 refills | Status: AC

## 2022-09-10 MED ORDER — ASPIRIN 81 MG PO TBEC: 81.0000 mg | DELAYED_RELEASE_TABLET | Freq: Every day | ORAL | 3 refills | Status: DC

## 2022-09-10 MED ORDER — ISOSORB DINITRATE-HYDRALAZINE 20-37.5 MG PO TABS: 0.5000 | ORAL_TABLET | Freq: Two times a day (BID) | ORAL | 0 refills | Status: AC

## 2022-09-10 MED ORDER — GLUCOSE 40 % PO GEL
2.0000 | ORAL | Status: DC
  Filled 2022-09-10: qty 2.42

## 2022-09-10 MED ORDER — CLOPIDOGREL BISULFATE 75 MG PO TABS: 75.0000 mg | ORAL_TABLET | Freq: Every day | ORAL | 2 refills | Status: DC

## 2022-09-10 MED ORDER — ATORVASTATIN CALCIUM 80 MG PO TABS: 80.0000 mg | ORAL_TABLET | Freq: Every day | ORAL | 5 refills | Status: AC

## 2022-09-10 MED ORDER — DEXTROSE 50 % IV SOLN
50.0000 mL | Freq: Once | INTRAVENOUS | Status: AC
  Administered 2022-09-10: 50 mL via INTRAVENOUS
  Filled 2022-09-10: qty 50

## 2022-09-10 MED ORDER — POTASSIUM CHLORIDE CRYS ER 20 MEQ PO TBCR
40.0000 meq | EXTENDED_RELEASE_TABLET | ORAL | Status: AC
  Administered 2022-09-10 (×2): 40 meq via ORAL
  Filled 2022-09-10 (×2): qty 2

## 2022-09-10 MED ORDER — FERROUS SULFATE 325 (65 FE) MG PO TABS: 325.0000 mg | ORAL_TABLET | Freq: Two times a day (BID) | ORAL | 2 refills | Status: AC

## 2022-09-10 MED ORDER — INSULIN ASPART 100 UNIT/ML IJ SOLN: 0.0000 [IU] | Freq: Three times a day (TID) | INTRAMUSCULAR | Status: DC

## 2022-09-10 MED ORDER — FINASTERIDE 5 MG PO TABS: 5.0000 mg | ORAL_TABLET | Freq: Every day | ORAL | 3 refills | Status: AC

## 2022-09-10 NOTE — Progress Notes (Signed)
  Subjective: Laotian Interpretor utilized to obtain history from the patient.   Patient states doing well this morning. Denies abdominal pain. Has some nausea but no vomiting, however states he feels okay. Has not had any BMs this morning. Patient does note that upon standing he feels like he loses his balance and becomes dizzy, this began yesterday.   Objective: Vital signs in last 24 hours: Temp:  [97 F (36.1 C)-98.1 F (36.7 C)] 97 F (36.1 C) (09/20 0433) Pulse Rate:  [58-74] 66 (09/20 0433) Resp:  [16-22] 20 (09/20 0433) BP: (89-136)/(51-79) 107/58 (09/20 0433) SpO2:  [96 %-100 %] 99 % (09/20 0433) Weight:  [56.2 kg] 56.2 kg (09/20 0500) Last BM Date : 09/05/22 General:   Alert and oriented, pleasant Head:  Normocephalic and atraumatic. Eyes:  No icterus, sclera clear. Conjuctiva pink.  Mouth:  Without lesions, mucosa pink and moist.  Heart:  S1, S2 present, no murmurs noted.  Lungs: Clear to auscultation bilaterally, without wheezing, rales, or rhonchi.  Abdomen:  Bowel sounds present, soft, non-tender, non-distended. No HSM or hernias noted. No rebound or guarding. No masses appreciated. Msk:  Symmetrical without gross deformities. Normal posture. Pulses:  Normal pulses noted. Extremities:  Without clubbing or edema. Neurologic:  Alert and  oriented x4;  grossly normal neurologically. Skin:  Warm and dry, intact without significant lesions.  Psych:  Alert and cooperative. Normal mood and affect.  Intake/Output from previous day: 09/19 0701 - 09/20 0700 In: 1480.3 [P.O.:340; I.V.:1068.3; IV Piggyback:72] Out: 700 [Urine:700] Intake/Output this shift: No intake/output data recorded.  Lab Results: Recent Labs    09/08/22 0501 09/09/22 0623 09/10/22 0531  WBC 9.5 5.6 5.7  HGB 8.7* 8.2* 9.3*  HCT 27.4* 25.9* 28.5*  PLT 234 277 387   BMET Recent Labs    09/09/22 0623 09/10/22 0531  NA 141 138  K 3.2* 3.4*  CL 115* 112*  CO2 19* 20*  GLUCOSE 63* 89  BUN 15  10  CREATININE 1.71* 1.57*  CALCIUM 7.2* 7.6*    Assessment: Randy [name] is a 81 y.o. year old male with history of chronic systolic heart failure, CKD stage 3, HTN, HLD, CAD, GERD, and BPH who presented to the ED on 9/15 due to change in mentation and elevated glucose.  GI Bleed: patient disimpacted on Thursday with large green soft stool folowed by large maroon colored stool, she became obtunded and hypotensive, required pressors initially but since discontinued. Hgb on admission 12.9 but dropped to 7.7 and began having BRBPR with clots, per nursing staff. 2 units PRBCs this admission (last on 9/15). CTA without obvious source of bleeding. EGD yesterday with large, non bleeding, cratered ulcer, concerning for malignancy. Pathology pending. Plavix should remain on hold for another 7 days. Hgb stable at 9.3 this morning. having some nausea but denies abdominal pain, no BMs this morning. As hemoglobin has remained stable, GI will likely sign off today.   Does note some dizziness/feeling off balance when standing, notably has had some soft BPs and a few episodes of hypotension, may benefit from orthostatic VS, will defer to hospitalist regarding this.   Plan: Follow for pathology results PPI BID Hold plavix for 7 more days  Avoid all NSAIDs Soft diet   LOS: 7 days    09/10/2022, 9:07 AM   Ezechiel Stooksbury L. Alver Sorrow, MSN, APRN, AGNP-C Adult-Gerontology Nurse Practitioner [facility]

## 2022-09-10 NOTE — Anesthesia Postprocedure Evaluation (Signed)
Anesthesia Post Note  Patient: Randy [name]  Procedure(s) Performed: ESOPHAGOGASTRODUODENOSCOPY (EGD) WITH PROPOFOL BIOPSY  Patient location during evaluation: Phase II Anesthesia Type: General Level of consciousness: awake Pain management: pain level controlled Vital Signs Assessment: post-procedure vital signs reviewed and stable Respiratory status: spontaneous breathing and respiratory function stable Cardiovascular status: blood pressure returned to baseline and stable Postop Assessment: no headache and no apparent nausea or vomiting Anesthetic complications: no Comments: Late entry   No notable events documented.   Last Vitals:  Vitals:   09/09/22 1954 09/10/22 0433  BP: 118/79 (!) 107/58  Pulse: 74 66  Resp:  20  Temp: 36.7 C (!) 36.1 C  SpO2: 99% 99%    Last Pain:  Vitals:   09/10/22 0227  TempSrc:   PainSc: [place]

## 2022-09-10 NOTE — Progress Notes (Addendum)
Progress Note   Patient: Randy [name] ZOX:[id number] DOB: Mar 19, 1941 DOA: 09/03/2022     7 DOS: the patient was seen and examined on 09/10/2022   Brief hospital course: Noor Shearman is a 81 y.o. male with medical history significant of chronic systolic heart failure, hypertension, hyperlipidemia, coronary artery disease, GERD and BPH; who was brought to the hospital secondary to change in mentation and elevated blood sugar. Patient mentation not allowing for him to participate in interview; currently agitated confused and not following commands. CT head demonstrated no acute intracranial abnormalities; blood work suggesting the presence of hyperosmolar hyperglycemic crisis with blood sugar in the 970 range.  Normal bicarb and worsening renal function. No acute signs of infection appreciated.   IV fluids, insulin therapy initiated.  TRH contacted to place patient in the hospital for further evaluation and management.  Assessment and Plan: Acute metabolic encephalopathy -Multifactorial: In the setting of hypernatremia, hyperglycemic state, uremia/worsening renal function and dehydration -Continue IV fluids (rate adjusted to prevent HF exacerbation and congestion). -Continue supportive care. -Continue constant reorientation and continue the use of as needed Haldol. -Follow clinical response. 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 -Overall hyperosmolar state corrected.  Hypokalemia - Potassium 3.4 -Will replete and follow electrolytes trend.  Acute on chronic  anemia -acute blood loss anemia In the setting of GI bleed. 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 -Currently hemodynamically stable and not requiring pressors support. -Hgb stable posttransfusion  Gastrointestinal hemorrhage - No clear source of GI bleed identified at this moment -Continue conservative management -GI service on board; will follow recommendations.  -Status post EGD on 09/09/2022 -Continue PPI.  BPH (benign prostatic hyperplasia) -Continue holding Flomax and Proscar in the setting of low blood pressure -No signs of urinary retention currently. -Close monitoring for any symptoms.  GERD (gastroesophageal reflux disease) -Continue PPI. -Dose adjusted to twice a day  -CT angiogram negative identifying acute source of bleeding.  Bleeding episodes reported -Stable hemoglobin after transfusion; continue to follow trend. -Heparin for DVT prophylaxis discontinued; SCDs in place -Continue holding aspirin and Plavix currently.  Dysphagia---speech pathology consult appreciated, pured diet recommended  Chronic systolic CHF (congestive heart failure) ([place]) -Stable and currently compensated -Continue to follow daily weights and strict I's and O's -Continue judicious fluid resuscitation as patient presented with hyperosmolar hyperglycemic crisis and hyponatremia. -Continue holding diuretics initially given worsening renal function.  Mixed hyperlipidemia -Resume the use of statins when able to tolerate p.o.'s.  Primary hypertension -Blood pressure soft but currently stable and has no longer required need for pressor support.   -Continue to follow vital signs. -Continue holding antihypertensive agents at this point. -Appear to be secondary to GI bleed/hypovolemic shock. -Continue to follow hemoglobin trend and transfuse as needed.   CAD, multiple vessel -No chest pain or shortness of breath currently -Continue home medication once blood pressure stabilized and continue  risk factor modification -Continue telemetry monitoring.  AKI-CKD 3B -In the setting of dehydration -Continue to hold nephrotoxic agents -Continue IV fluid resuscitation -Continue to follow renal function trend. -Creatinine 1.7; pretty much back to his baseline at this moment. -Continue adjusting IV fluids rate.   Subjective:  - iPad language  interpreter used communicate with patient -Persistent hypoglycemic episodes requiring continuous IV dextrose infusion  Physical Exam: Vitals:   09/09/22 1613 09/09/22 1954 09/10/22 0433 09/10/22 0500  BP: (!) 117/54 118/79 (!) 107/58   Pulse: 65 74 66   Resp: 17  20   Temp: (!) 97.4 F (36.3 C) 98.1 F (36.7 C) (!) 97 F (36.1 C)   TempSrc: Axillary     SpO2: 97% 99% 99%   Weight:    56.2 kg  Height:       General exam: Alert, awake, no acute distress  respiratory system: Clear to auscultation. Respiratory effort normal.  No using accessory muscle. Cardiovascular system:RRR. No rubs or gallops. Gastrointestinal system: Abdomen is nondistended, soft and nontender.  Central nervous system: Alert and oriented. No focal neurological deficits. Extremities: No cyanosis or clubbing. Skin: No petechiae. Psychiatry: Judgement and insight appear stable for the most part; flat affect.  Family Communication: No family at bedside--- iPad language interpreter used communicate with patient Disposition: Status is: Inpatient Remains inpatient appropriate because: Persistent hypoglycemia requiring continuous IV dextrose--Recurrent episodes of hypoglycemia requiring D50 administrations, and continuous D5 infusion   Planned Discharge Destination: Skilled nursing facility long-term resident.--At rehab in [place]: Roxan Hockey, MD 09/10/2022 4:37 PM  For on call review [email or web address].

## 2022-09-10 NOTE — Progress Notes (Signed)
Orthostatic vitals for pt Lying 103/50 Pulse: 58 Sitting: 96/56 Pulse: 60 Standing: 99/45 Pulse: 65

## 2022-09-10 NOTE — Inpatient Diabetes Management (Signed)
Inpatient Diabetes Program Recommendations  AACE/ADA: New Consensus Statement on Inpatient Glycemic Control (2015)  Target Ranges:  Prepandial:   less than 140 mg/dL      Peak postprandial:   less than 180 mg/dL (1-2 hours)      Critically ill patients:  140 - 180 mg/dL    Latest Reference Range & Units 09/10/22 05:31  Glucose 70 - 99 mg/dL 89    Latest Reference Range & Units 09/09/22 07:19 09/09/22 07:42 09/09/22 11:08 09/09/22 16:12 09/09/22 19:56  Glucose-Capillary 70 - 99 mg/dL 50 (L) 102 (H) 156 (H) 74 135 (H)   Review of Glycemic Control  Diabetes history: DM2 Outpatient Diabetes medications: Jardiance 10 mg daily, Metformin 500 mg BID Current orders for Inpatient glycemic control:  Levemir 5 units QHS, Novolog 0-15 units TID with meals, Novolog 0-5 units QHS   Inpatient Diabetes Program Recommendations:     Insulin: Lab glucose 63 mg/dl today. Please consider decreasing Levemir to 3 units BID.   Thanks, Barnie Alderman, RN, MSN, [place] Diabetes Coordinator Inpatient Diabetes Program [phone] (Team Pager from 8am to [place])

## 2022-09-10 NOTE — Progress Notes (Signed)
Pt's CBG-46 gave pt orange juice. Will recheck in 15 mins and continue to monitor pt.

## 2022-09-10 NOTE — Progress Notes (Signed)
Speech Language Pathology Treatment: Dysphagia  Patient Details Name: Randy [name] MRN: [id number] DOB: Jul 15, 1941 Today's Date: 09/10/2022 Time: 3606-7703 SLP Time Calculation (min) (ACUTE ONLY): 9 min  Assessment / Plan / Recommendation Clinical Impression  Pt agitated this date and climbing out of bed, requesting to go home. He refused PO trials due to agitation. He is consuming his baseline diet of puree and thin liquids, GI signing off. Continue diet as ordered of D1/puree and thin liquids with supervision. SLP will sign off. Reconsult if indicated.   HPI HPI: Randy [name] is a 81 y.o. male with medical history significant of chronic systolic heart failure, hypertension, hyperlipidemia, coronary artery disease, GERD and BPH; who was brought to the hospital secondary to change in mentation and elevated blood sugar. CT head demonstrated no acute intracranial abnormalities; blood work suggesting the presence of hyperosmolar hyperglycemic crisis with blood sugar in the 970 range.  Normal bicarb and worsening renal function.  No acute signs of infection appreciated.GI bleed: required pressor support but now has been discontinued as of yesterday morning. 2 units PRBCs received this admission. Hgb remaining stable over past 48 hours.CTA without source for active bleeding. He has had no further overt GI bleeding per nursing staff. Last EGD in Dec 2022 normal by Dr. Loletha Carrow. BSE requested. PT scheduled for EGD tomorrow.      SLP Plan  All goals met;Discharge SLP treatment due to (comment)      Recommendations for follow up therapy are one component of a multi-disciplinary discharge planning process, led by the attending physician.  Recommendations may be updated based on patient status, additional functional criteria and insurance authorization.    Recommendations  Diet recommendations: Dysphagia 1 (puree);Thin liquid Liquids provided via: Cup;Straw Medication Administration: Whole meds  with liquid Supervision: Patient able to self feed;Intermittent supervision to cue for compensatory strategies Compensations: Slow rate;Small sips/bites Postural Changes and/or Swallow Maneuvers: Seated upright 90 degrees;Upright 30-60 min after meal                Oral Care Recommendations: Oral care BID;Staff/trained caregiver to provide oral care Follow Up Recommendations: Follow physician's recommendations for discharge plan and follow up therapies Assistance recommended at discharge: Frequent or constant Supervision/Assistance SLP Visit Diagnosis: Dysphagia, unspecified (R13.10) Plan: All goals met;Discharge SLP treatment due to (comment)          Thank you,  Genene Churn, [place] ] Clementine Soulliere  09/10/2022, 1:31 PM

## 2022-09-11 ENCOUNTER — Observation Stay (HOSPITAL_COMMUNITY)
Admission: EM | Admit: 2022-09-11 | Discharge: 2022-09-15 | Disposition: A | Discharge status: Other Acute Inpatient Hospital | Payer: Medicare Other | Attending: Internal Medicine | Admitting: Internal Medicine

## 2022-09-11 ENCOUNTER — Other Ambulatory Visit: Payer: Self-pay

## 2022-09-11 ENCOUNTER — Encounter (HOSPITAL_COMMUNITY): Payer: Self-pay

## 2022-09-11 DIAGNOSIS — I2510 Atherosclerotic heart disease of native coronary artery without angina pectoris: Secondary | ICD-10-CM

## 2022-09-11 DIAGNOSIS — R770 Abnormality of albumin: Secondary | ICD-10-CM | POA: Insufficient documentation

## 2022-09-11 DIAGNOSIS — I272 Pulmonary hypertension, unspecified: Secondary | ICD-10-CM

## 2022-09-11 DIAGNOSIS — K250 Acute gastric ulcer with hemorrhage: Secondary | ICD-10-CM | POA: Diagnosis not present

## 2022-09-11 DIAGNOSIS — E1165 Type 2 diabetes mellitus with hyperglycemia: Secondary | ICD-10-CM | POA: Insufficient documentation

## 2022-09-11 DIAGNOSIS — E782 Mixed hyperlipidemia: Secondary | ICD-10-CM

## 2022-09-11 DIAGNOSIS — E1122 Type 2 diabetes mellitus with diabetic chronic kidney disease: Secondary | ICD-10-CM | POA: Insufficient documentation

## 2022-09-11 DIAGNOSIS — Z7902 Long term (current) use of antithrombotics/antiplatelets: Secondary | ICD-10-CM | POA: Insufficient documentation

## 2022-09-11 DIAGNOSIS — N400 Enlarged prostate without lower urinary tract symptoms: Secondary | ICD-10-CM

## 2022-09-11 DIAGNOSIS — K254 Chronic or unspecified gastric ulcer with hemorrhage: Secondary | ICD-10-CM

## 2022-09-11 DIAGNOSIS — K922 Gastrointestinal hemorrhage, unspecified: Secondary | ICD-10-CM

## 2022-09-11 DIAGNOSIS — D649 Anemia, unspecified: Secondary | ICD-10-CM

## 2022-09-11 DIAGNOSIS — E871 Hypo-osmolality and hyponatremia: Secondary | ICD-10-CM | POA: Insufficient documentation

## 2022-09-11 DIAGNOSIS — Z7982 Long term (current) use of aspirin: Secondary | ICD-10-CM | POA: Insufficient documentation

## 2022-09-11 DIAGNOSIS — I130 Hypertensive heart and chronic kidney disease with heart failure and stage 1 through stage 4 chronic kidney disease, or unspecified chronic kidney disease: Secondary | ICD-10-CM | POA: Insufficient documentation

## 2022-09-11 DIAGNOSIS — K253 Acute gastric ulcer without hemorrhage or perforation: Secondary | ICD-10-CM

## 2022-09-11 DIAGNOSIS — K219 Gastro-esophageal reflux disease without esophagitis: Secondary | ICD-10-CM

## 2022-09-11 DIAGNOSIS — Z79899 Other long term (current) drug therapy: Secondary | ICD-10-CM | POA: Insufficient documentation

## 2022-09-11 DIAGNOSIS — I5042 Chronic combined systolic (congestive) and diastolic (congestive) heart failure: Secondary | ICD-10-CM

## 2022-09-11 DIAGNOSIS — D62 Acute posthemorrhagic anemia: Secondary | ICD-10-CM | POA: Insufficient documentation

## 2022-09-11 DIAGNOSIS — N1832 Chronic kidney disease, stage 3b: Secondary | ICD-10-CM

## 2022-09-11 DIAGNOSIS — Z7984 Long term (current) use of oral hypoglycemic drugs: Secondary | ICD-10-CM | POA: Insufficient documentation

## 2022-09-11 DIAGNOSIS — D75839 Thrombocytosis, unspecified: Secondary | ICD-10-CM | POA: Insufficient documentation

## 2022-09-11 DIAGNOSIS — I10 Essential (primary) hypertension: Secondary | ICD-10-CM

## 2022-09-11 LAB — BASIC METABOLIC PANEL
Anion gap: 3 — ABNORMAL LOW (ref 5–15)
BUN: 9 mg/dL (ref 8–23)
CO2: 20 mmol/L — ABNORMAL LOW (ref 22–32)
Calcium: 7.3 mg/dL — ABNORMAL LOW (ref 8.9–10.3)
Chloride: 112 mmol/L — ABNORMAL HIGH (ref 98–111)
Creatinine, Ser: 1.48 mg/dL — ABNORMAL HIGH (ref 0.61–1.24)
GFR, Estimated: 47 mL/min — ABNORMAL LOW (ref >60)
Glucose, Bld: 204 mg/dL — ABNORMAL HIGH (ref 70–99)
Potassium: 4.4 mmol/L (ref 3.5–5.1)
Sodium: 135 mmol/L (ref 135–145)

## 2022-09-11 LAB — CBC WITH DIFFERENTIAL/PLATELET
Abs Immature Granulocytes: 0.14 10*3/uL — ABNORMAL HIGH (ref 0.00–0.07)
Basophils Absolute: 0.1 10*3/uL (ref 0.0–0.1)
Basophils Relative: 1 %
Eosinophils Absolute: 0.4 10*3/uL (ref 0.0–0.5)
Eosinophils Relative: 7 %
HCT: 31.1 % — ABNORMAL LOW (ref 39.0–52.0)
Hemoglobin: 9.9 g/dL — ABNORMAL LOW (ref 13.0–17.0)
Immature Granulocytes: 2 %
Lymphocytes Relative: 15 %
Lymphs Abs: 0.9 10*3/uL (ref 0.7–4.0)
MCH: 28.4 pg (ref 26.0–34.0)
MCHC: 31.8 g/dL (ref 30.0–36.0)
MCV: 89.4 fL (ref 80.0–100.0)
Monocytes Absolute: 0.7 10*3/uL (ref 0.1–1.0)
Monocytes Relative: 12 %
Neutro Abs: 3.9 10*3/uL (ref 1.7–7.7)
Neutrophils Relative %: 63 %
Platelets: 495 10*3/uL — ABNORMAL HIGH (ref 150–400)
RBC: 3.48 MIL/uL — ABNORMAL LOW (ref 4.22–5.81)
RDW: 15.7 % — ABNORMAL HIGH (ref 11.5–15.5)
WBC: 6.1 10*3/uL (ref 4.0–10.5)
nRBC: 0.5 % — ABNORMAL HIGH (ref 0.0–0.2)

## 2022-09-11 LAB — CBC
HCT: 28 % — ABNORMAL LOW (ref 39.0–52.0)
Hemoglobin: 9 g/dL — ABNORMAL LOW (ref 13.0–17.0)
MCH: 28.7 pg (ref 26.0–34.0)
MCHC: 32.1 g/dL (ref 30.0–36.0)
MCV: 89.2 fL (ref 80.0–100.0)
Platelets: 422 10*3/uL — ABNORMAL HIGH (ref 150–400)
RBC: 3.14 MIL/uL — ABNORMAL LOW (ref 4.22–5.81)
RDW: 15.2 % (ref 11.5–15.5)
WBC: 5 10*3/uL (ref 4.0–10.5)
nRBC: 0.6 % — ABNORMAL HIGH (ref 0.0–0.2)

## 2022-09-11 LAB — COMPREHENSIVE METABOLIC PANEL
ALT: 17 U/L (ref 0–44)
AST: 28 U/L (ref 15–41)
Albumin: 2.3 g/dL — ABNORMAL LOW (ref 3.5–5.0)
Alkaline Phosphatase: 72 U/L (ref 38–126)
Anion gap: 6 (ref 5–15)
BUN: 10 mg/dL (ref 8–23)
CO2: 20 mmol/L — ABNORMAL LOW (ref 22–32)
Calcium: 7.6 mg/dL — ABNORMAL LOW (ref 8.9–10.3)
Chloride: 108 mmol/L (ref 98–111)
Creatinine, Ser: 1.65 mg/dL — ABNORMAL HIGH (ref 0.61–1.24)
GFR, Estimated: 41 mL/min — ABNORMAL LOW (ref >60)
Glucose, Bld: 251 mg/dL — ABNORMAL HIGH (ref 70–99)
Potassium: 4.8 mmol/L (ref 3.5–5.1)
Sodium: 134 mmol/L — ABNORMAL LOW (ref 135–145)
Total Bilirubin: 0.5 mg/dL (ref 0.3–1.2)
Total Protein: 5.9 g/dL — ABNORMAL LOW (ref 6.5–8.1)

## 2022-09-11 LAB — AMMONIA: Ammonia: <10 umol/L (ref 9–35)

## 2022-09-11 LAB — PROTIME-INR
INR: 1 (ref 0.8–1.2)
Prothrombin Time: 13.2 seconds (ref 11.4–15.2)

## 2022-09-11 LAB — TYPE AND SCREEN
ABO/RH(D): B POS
ABO/RH(D): B POS
Antibody Screen: NEGATIVE
Antibody Screen: NEGATIVE

## 2022-09-11 LAB — GLUCOSE, CAPILLARY
Glucose-Capillary: 207 mg/dL — ABNORMAL HIGH (ref 70–99)
Glucose-Capillary: 199 mg/dL — ABNORMAL HIGH (ref 70–99)

## 2022-09-11 LAB — SURGICAL PATHOLOGY

## 2022-09-11 LAB — POC OCCULT BLOOD, ED: Fecal Occult Bld: POSITIVE — AB

## 2022-09-11 MED ORDER — GLIMEPIRIDE 2 MG PO TABS: 2.0000 mg | ORAL_TABLET | Freq: Every day | ORAL | 2 refills | Status: AC

## 2022-09-11 MED ORDER — GLUCOSE 4 G PO CHEW: 1.0000 | CHEWABLE_TABLET | ORAL | 12 refills | Status: AC | PRN

## 2022-09-11 MED ORDER — PANTOPRAZOLE 80MG IVPB - SIMPLE MED
80.0000 mg | Freq: Once | INTRAVENOUS | Status: AC
  Administered 2022-09-11: 80 mg via INTRAVENOUS
  Filled 2022-09-11: qty 100

## 2022-09-11 MED ORDER — LORAZEPAM 0.5 MG PO TABS: 0.5000 mg | ORAL_TABLET | Freq: Two times a day (BID) | ORAL | 0 refills | Status: DC | PRN

## 2022-09-11 MED ORDER — CLOPIDOGREL BISULFATE 75 MG PO TABS: 75.0000 mg | ORAL_TABLET | Freq: Every day | ORAL | 2 refills | Status: AC

## 2022-09-11 MED ORDER — SODIUM CHLORIDE 0.9 % IV BOLUS
1000.0000 mL | Freq: Once | INTRAVENOUS | Status: AC
  Administered 2022-09-11: 1000 mL via INTRAVENOUS

## 2022-09-11 MED ORDER — PANTOPRAZOLE INFUSION (NEW) - SIMPLE MED
8.0000 mg/h | INTRAVENOUS | Status: DC
  Administered 2022-09-11 – 2022-09-12 (×3): 8 mg/h via INTRAVENOUS
  Filled 2022-09-11 (×5): qty 100

## 2022-09-11 MED ORDER — SODIUM CHLORIDE 0.9 % IV SOLN: INTRAVENOUS | Status: DC

## 2022-09-11 MED ORDER — ASPIRIN 81 MG PO TBEC: 81.0000 mg | DELAYED_RELEASE_TABLET | Freq: Every day | ORAL | 3 refills | Status: AC

## 2022-09-11 MED ORDER — POTASSIUM CHLORIDE ER 10 MEQ PO TBCR: 10.0000 meq | EXTENDED_RELEASE_TABLET | Freq: Every day | ORAL | 2 refills | Status: AC

## 2022-09-11 MED ORDER — PANTOPRAZOLE SODIUM 40 MG IV SOLR
INTRAVENOUS | Status: AC
  Filled 2022-09-11: qty 20

## 2022-09-11 MED ORDER — LORAZEPAM 2 MG/ML IJ SOLN
1.0000 mg | Freq: Once | INTRAMUSCULAR | Status: AC
  Administered 2022-09-11: 1 mg via INTRAVENOUS
  Filled 2022-09-11: qty 1

## 2022-09-11 MED ORDER — HALOPERIDOL LACTATE 5 MG/ML IJ SOLN
1.0000 mg | Freq: Once | INTRAMUSCULAR | Status: AC
  Administered 2022-09-11: 1 mg via INTRAMUSCULAR
  Filled 2022-09-11: qty 1

## 2022-09-11 MED ORDER — METFORMIN HCL 500 MG PO TABS: 500.0000 mg | ORAL_TABLET | Freq: Two times a day (BID) | ORAL | 3 refills | Status: AC

## 2022-09-11 NOTE — Care Management Important Message (Signed)
Important Message  Patient Details  Name: Randy [name] MRN: [id number] Date of Birth: 1941-10-26   Medicare Important Message Given:  Yes (unable to reach daughter Harlene Ramus [phone], son Josuha Fontanez [phone], copy mailed to facility listed on file)     Tommy Medal 09/11/2022, 12:46 PM

## 2022-09-11 NOTE — ED Provider Notes (Signed)
[facility] Provider Note   CSN: [id number] Arrival date & time: 09/11/22  2011     History  Chief Complaint  Patient presents with   Rectal Bleeding    Randy [name] is a 81 y.o. male.  Pt is a 81 yo male with a pmhx significant for DM, HTN, CHF, CAD, and gastric ulcers.  Pt was admitted on 9/13 for confusion.  It was thought to be due to hyperosmolar non-ketotic state and hypernatremia and uremia.  While here, he had a GI bleed.  Dr. Abbey Chatters did an endoscopy which showed a large gastric ulcer.  He was concerned for cancer and sent it to path, but results are not back yet.  Pt's hgb was stable so he was d/c today.  He went to the SNF and was sent back after he was noted to have a large, melanotic stool.  Pt has dementia and is unable to contribute to the hx.       Home Medications Prior to Admission medications   Medication Sig Start Date End Date Taking? Authorizing Provider  aspirin EC 81 MG tablet Take 1 tablet (81 mg total) by mouth daily with breakfast. Swallow whole. 09/18/22   Roxan Hockey, MD  atorvastatin (LIPITOR) 80 MG tablet Take 1 tablet (80 mg total) by mouth daily. 09/10/22   Roxan Hockey, MD  bumetanide (BUMEX) 1 MG tablet Take 1 tablet (1 mg total) by mouth daily. 09/10/22   Roxan Hockey, MD  carvedilol (COREG) 3.125 MG tablet Take 2 tablets (6.25 mg total) by mouth 2 (two) times daily with a meal. 09/22/22 10/22/22  Roxan Hockey, MD  clopidogrel (PLAVIX) 75 MG tablet Take 1 tablet (75 mg total) by mouth daily. 09/18/22   Roxan Hockey, MD  divalproex (DEPAKOTE) 125 MG DR tablet Take 375 mg by mouth 3 (three) times daily. 08/13/22   [provider]  ferrous sulfate 325 (65 FE) MG tablet Take 1 tablet (325 mg total) by mouth 2 (two) times daily with a meal. 09/10/22   Emokpae, Courage, MD  finasteride (PROSCAR) 5 MG tablet Take 1 tablet (5 mg total) by mouth daily. 09/10/22   Roxan Hockey, MD  glimepiride (AMARYL) 2 MG  tablet Take 1 tablet (2 mg total) by mouth daily with breakfast. 09/11/22 09/11/23  Roxan Hockey, MD  glucose 4 GM chewable tablet Chew 1 tablet (4 g total) by mouth as needed for low blood sugar. 09/11/22   Emokpae, Courage, MD  isosorbide-hydrALAZINE (BIDIL) 20-37.5 MG tablet Take 0.5 tablets by mouth 2 (two) times daily. 09/10/22   Roxan Hockey, MD  LORazepam (ATIVAN) 0.5 MG tablet Take 1 tablet (0.5 mg total) by mouth every 12 (twelve) hours as needed for anxiety. 09/11/22 09/11/23  Roxan Hockey, MD  metFORMIN (GLUCOPHAGE) 500 MG tablet Take 1 tablet (500 mg total) by mouth 2 (two) times daily with a meal. 09/11/22 09/11/23  Roxan Hockey, MD  mirtazapine (REMERON) 15 MG tablet Take 7.5 mg by mouth daily. 09/02/22   [provider]  OLANZapine (ZYPREXA) 7.5 MG tablet Take by mouth. 08/19/22   [provider]  pantoprazole (PROTONIX) 40 MG tablet Take 1 tablet (40 mg total) by mouth 2 (two) times daily before a meal. 11/28/21   Thurnell Lose, MD  potassium chloride (KLOR-CON) 10 MEQ tablet Take 1 tablet (10 mEq total) by mouth daily. Take While taking Bumex 09/11/22   Roxan Hockey, MD  tamsulosin (FLOMAX) 0.4 MG CAPS capsule Take 2 capsules (0.8 mg total)  by mouth daily. 11/28/21   Thurnell Lose, MD  vitamin B-12 (CYANOCOBALAMIN) 500 MCG tablet Take 1 tablet (500 mcg total) by mouth daily. 11/21/21   Lavina Hamman, MD      Allergies    Patient has no known allergies.    Review of Systems   Review of Systems  Unable to perform ROS: Dementia    Physical Exam Updated Vital Signs BP (!) 118/49   Pulse (!) 59   Temp 98.4 F (36.9 C)   Resp 17   Ht '5\' 2"'$  (1.575 m)   Wt 55.6 kg   SpO2 97%   BMI 22.42 kg/m  Physical Exam Vitals and nursing note reviewed.  Constitutional:      Appearance: Normal appearance.  HENT:     Head: Normocephalic and atraumatic.     Right Ear: External ear normal.     Left Ear: External ear normal.     Nose: Nose normal.      Mouth/Throat:     Mouth: Mucous membranes are moist.     Pharynx: Oropharynx is clear.  Eyes:     Extraocular Movements: Extraocular movements intact.     Conjunctiva/sclera: Conjunctivae normal.     Pupils: Pupils are equal, round, and reactive to light.  Cardiovascular:     Rate and Rhythm: Normal rate and regular rhythm.     Pulses: Normal pulses.     Heart sounds: Normal heart sounds.  Pulmonary:     Effort: Pulmonary effort is normal.     Breath sounds: Normal breath sounds.  Abdominal:     General: Abdomen is flat. Bowel sounds are normal.     Palpations: Abdomen is soft.  Genitourinary:    Rectum: Guaiac result positive.     Comments: Melanotic stool on exam Musculoskeletal:     Cervical back: Normal range of motion and neck supple.  Skin:    General: Skin is warm.     Capillary Refill: Capillary refill takes less than 2 seconds.  Neurological:     Mental Status: He is alert. Mental status is at baseline. He is disoriented.     Comments: Pt is moving all 4 extremities.  He is not oriented at baseline.  Psychiatric:        Behavior: Behavior is agitated.     ED Results / Procedures / Treatments   Labs (all labs ordered are listed, but only abnormal results are displayed) Labs Reviewed  COMPREHENSIVE METABOLIC PANEL - Abnormal; Notable for the following components:      Result Value   Sodium 134 (*)    CO2 20 (*)    Glucose, Bld 251 (*)    Creatinine, Ser 1.65 (*)    Calcium 7.6 (*)    Total Protein 5.9 (*)    Albumin 2.3 (*)    GFR, Estimated 41 (*)    All other components within normal limits  CBC WITH DIFFERENTIAL/PLATELET - Abnormal; Notable for the following components:   RBC 3.48 (*)    Hemoglobin 9.9 (*)    HCT 31.1 (*)    RDW 15.7 (*)    Platelets 495 (*)    nRBC 0.5 (*)    Abs Immature Granulocytes 0.14 (*)    All other components within normal limits  POC OCCULT BLOOD, ED - Abnormal; Notable for the following components:   Fecal Occult Bld  POSITIVE (*)    All other components within normal limits  PROTIME-INR  AMMONIA  CBG MONITORING, ED  TYPE AND SCREEN    EKG None  Radiology No results found.  Procedures Procedures    Medications Ordered in ED Medications  sodium chloride 0.9 % bolus 1,000 mL (1,000 mLs Intravenous New Bag/Given 09/11/22 2145)    And  0.9 %  sodium chloride infusion (has no administration in time range)  pantoprozole (PROTONIX) 80 mg /NS 100 mL infusion (has no administration in time range)  pantoprazole (PROTONIX) 80 mg /NS 100 mL IVPB (80 mg Intravenous New Bag/Given 09/11/22 2220)  LORazepam (ATIVAN) injection 1 mg (1 mg Intravenous Given 09/11/22 2219)    ED Course/ Medical Decision Making/ A&P                           Medical Decision Making Amount and/or Complexity of Data Reviewed Labs: ordered.  Risk Prescription drug management. Decision regarding hospitalization.   This patient presents to the ED for concern of bloody stool, this involves an extensive number of treatment options, and is a complaint that carries with it a high risk of complications and morbidity.  The differential diagnosis includes hemorrhoid, gastric ulcer   Co morbidities that complicate the patient evaluation  DM, HTN, CHF, CAD, and gastric ulcers   Additional history obtained:  Additional history obtained from epic chart review External records from outside source obtained and reviewed including EMS report   Lab Tests:  I Ordered, and personally interpreted labs.  The pertinent results include:  cbc with hgb 9.9 (hgb 9 this am); bs is 251 and cr 1.65 (1.48 this am)   Cardiac Monitoring:  The patient was maintained on a cardiac monitor.  I personally viewed and interpreted the cardiac monitored which showed an underlying rhythm of: nsr   Medicines ordered and prescription drug management:  I ordered medication including protonix  for gi bleed  Reevaluation of the patient after these  medicines showed that the patient improved I have reviewed the patients home medicines and have made adjustments as needed  Critical Interventions:  protonix   Consultations Obtained:  I requested consultation with the gastroenterologist (Dr. Abbey Chatters),  and discussed lab and imaging findings as well as pertinent plan -he felt the safest option is to keep pt overnight and monitor serial hemoglobins. Pt d/w Dr. Josephine Cables (triad) for admission.   Problem List / ED Course:  Upper GI bleed:  pt is hemodynamically stable.  Hgb is better than this am, but he just had a large melanotic stool.  He does not need blood right now.  Pt will need admission. Dementia:  pt is agitated and pulling off monitor leads.  He is given ativan for sedation.   Reevaluation:  After the interventions noted above, I reevaluated the patient and found that they have :improved   Social Determinants of Health:  Lives at [facility]   Dispostion:  After consideration of the diagnostic results and the patients response to treatment, I feel that the patent would benefit from admission.          Final Clinical Impression(s) / ED Diagnoses Final diagnoses:  Upper GI bleed  Anemia, unspecified type  Acute gastric ulcer, unspecified whether gastric ulcer hemorrhage or perforation present    Rx / DC Orders ED Discharge Orders     None         Isla Pence, MD 09/11/22 2302

## 2022-09-11 NOTE — Progress Notes (Signed)
Report given to [facility] [place].   Guardian called. No answer.

## 2022-09-11 NOTE — H&P (Signed)
History and Physical    Patient: Randy [name] ZSW:[id number] DOB: May 17, 1941 DOA: 09/11/2022 DOS: the patient was seen and examined on 09/12/2022 PCP: Jilda Panda, MD  Patient coming from: SNF   Chief Complaint:  Chief Complaint  Patient presents with   Rectal Bleeding   HPI: Randy [name] is a 81 y.o. male with medical history significant of hypertension, hyperlipidemia, chronic systolic heart failure, CAD, BPH and GERD who presents to the emergency department via EMS from a skilled nursing facility.  He was admitted on 9/13 due to acute metabolic encephalopathy and discharged today to a skilled nursing facility secondary to hyperosmolar nonketotic state due to type 2 DM and acute blood loss anemia in the setting of GI bleed in which large gastric ulcer was noted status post EGD-09/09/2022 with pending biopsies, he was discharged earlier today to a SNF with Protonix twice daily.  Unfortunately, patient was sent back from SNF after being noted to have a large bright red blood per rectum.  ED Course:  In the emergency department, temperature was 98.4, respiratory rate 17/min, pulse 59 bpm, BP 95/62, O2 sat 97%.  Work-up in the ED showed H/H 9.9/31.1 (this was 9.0/28.0 prior to discharge today), platelets 495, FOBT positive, sodium 134, potassium 4.8, chloride 108, bicarb 20, glucose 251, BUN 18, creatinine 1.65.  Ammonia less than 10, FOBT positive.  Review of Systems: This cannot be performed at this time due to the patient's underlying dementia   Past Medical History:  Diagnosis Date   Acute systolic CHF (congestive heart failure) ([place]) 11/16/2021   CAD, 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   Hearing loss    HTN (hypertension)    Past Surgical History:  Procedure Laterality Date   ESOPHAGOGASTRODUODENOSCOPY (EGD) WITH PROPOFOL N/A 11/26/2021   Procedure: ESOPHAGOGASTRODUODENOSCOPY (EGD) WITH PROPOFOL;  Surgeon: Doran Stabler, MD;  Location: [facility];  Service:  Gastroenterology;  Laterality: N/A;   EYE SURGERY     IABP INSERTION N/A 01/30/2021   Procedure: IABP Insertion;  Surgeon: Leonie Man, MD;  Location: [facility];  Service: Cardiovascular;  Laterality: N/A;   RIGHT/LEFT HEART CATH AND CORONARY ANGIOGRAPHY N/A 01/30/2021   Procedure: RIGHT/LEFT HEART CATH AND CORONARY ANGIOGRAPHY;  Surgeon: Leonie Man, MD;  Location: [facility];  Service: Cardiovascular;  Laterality: N/A;    Social History:  reports that he has never smoked. He has never used smokeless tobacco. He reports that he does not drink alcohol and does not use drugs.   No Known Allergies  Family history: No pertinent family history.  Prior to Admission medications   Medication Sig Start Date End Date Taking? Authorizing Provider  aspirin EC 81 MG tablet Take 1 tablet (81 mg total) by mouth daily with breakfast. Swallow whole. 09/18/22   Roxan Hockey, MD  atorvastatin (LIPITOR) 80 MG tablet Take 1 tablet (80 mg total) by mouth daily. 09/10/22   Roxan Hockey, MD  bumetanide (BUMEX) 1 MG tablet Take 1 tablet (1 mg total) by mouth daily. 09/10/22   Roxan Hockey, MD  carvedilol (COREG) 3.125 MG tablet Take 2 tablets (6.25 mg total) by mouth 2 (two) times daily with a meal. 09/22/22 10/22/22  Roxan Hockey, MD  clopidogrel (PLAVIX) 75 MG tablet Take 1 tablet (75 mg total) by mouth daily. 09/18/22   Roxan Hockey, MD  divalproex (DEPAKOTE) 125 MG DR tablet Take 375 mg by mouth 3 (three) times daily. 08/13/22   [provider]  ferrous sulfate 325 (65 FE) MG  tablet Take 1 tablet (325 mg total) by mouth 2 (two) times daily with a meal. 09/10/22   Emokpae, Courage, MD  finasteride (PROSCAR) 5 MG tablet Take 1 tablet (5 mg total) by mouth daily. 09/10/22   Roxan Hockey, MD  glimepiride (AMARYL) 2 MG tablet Take 1 tablet (2 mg total) by mouth daily with breakfast. 09/11/22 09/11/23  Roxan Hockey, MD  glucose 4 GM chewable tablet Chew 1 tablet (4 g  total) by mouth as needed for low blood sugar. 09/11/22   Emokpae, Courage, MD  isosorbide-hydrALAZINE (BIDIL) 20-37.5 MG tablet Take 0.5 tablets by mouth 2 (two) times daily. 09/10/22   Roxan Hockey, MD  LORazepam (ATIVAN) 0.5 MG tablet Take 1 tablet (0.5 mg total) by mouth every 12 (twelve) hours as needed for anxiety. 09/11/22 09/11/23  Roxan Hockey, MD  metFORMIN (GLUCOPHAGE) 500 MG tablet Take 1 tablet (500 mg total) by mouth 2 (two) times daily with a meal. 09/11/22 09/11/23  Roxan Hockey, MD  mirtazapine (REMERON) 15 MG tablet Take 7.5 mg by mouth daily. 09/02/22   [provider]  OLANZapine (ZYPREXA) 7.5 MG tablet Take by mouth. 08/19/22   [provider]  pantoprazole (PROTONIX) 40 MG tablet Take 1 tablet (40 mg total) by mouth 2 (two) times daily before a meal. 11/28/21   Thurnell Lose, MD  potassium chloride (KLOR-CON) 10 MEQ tablet Take 1 tablet (10 mEq total) by mouth daily. Take While taking Bumex 09/11/22   Roxan Hockey, MD  tamsulosin (FLOMAX) 0.4 MG CAPS capsule Take 2 capsules (0.8 mg total) by mouth daily. 11/28/21   Thurnell Lose, MD  vitamin B-12 (CYANOCOBALAMIN) 500 MCG tablet Take 1 tablet (500 mcg total) by mouth daily. 11/21/21   Lavina Hamman, MD    Physical Exam: BP (!) 100/59   Pulse 64   Temp 98.4 F (36.9 C)   Resp 17   Ht '5\' 2"'$  (1.575 m)   Wt 55.6 kg   SpO2 100%   BMI 22.42 kg/m   General: 81 y.o. year-old male well developed well nourished in no acute distress.  Alert and oriented x3. HEENT: NCAT, EOMI Neck: Supple, trachea medial Cardiovascular: Regular rate and rhythm with no rubs or gallops.  No thyromegaly or JVD noted.  No lower extremity edema. 2/4 pulses in all 4 extremities. Respiratory: Clear to auscultation with no wheezes or rales. Good inspiratory effort. Abdomen: Soft, nontender nondistended with normal bowel sounds x4 quadrants. Muskuloskeletal: No cyanosis, clubbing or edema noted bilaterally Neuro:  Disoriented, sensation, reflexes intact Skin: No ulcerative lesions noted or rashes Psychiatry: Agitated and intermittently combative         Labs on Admission:  Basic Metabolic Panel: Recent Labs  Lab 09/07/22 0350 09/09/22 0623 09/10/22 0531 09/10/22 0846 09/11/22 0659 09/11/22 2022  NA 141 141 138  --  135 134*  K 3.1* 3.2* 3.4*  --  4.4 4.8  CL 113* 115* 112*  --  112* 108  CO2 23 19* 20*  --  20* 20*  GLUCOSE 162* 63* 89  --  204* 251*  BUN 32* 15 10  --  9 10  CREATININE 1.81* 1.71* 1.57*  --  1.48* 1.65*  CALCIUM 7.0* 7.2* 7.6*  --  7.3* 7.6*  MG  --   --   --  1.7  --   --    Liver Function Tests: Recent Labs  Lab 09/11/22 2022  AST 28  ALT 17  ALKPHOS 72  BILITOT 0.5  PROT 5.9*  ALBUMIN 2.3*   No results for input(s): "LIPASE", "AMYLASE" in the last 168 hours. Recent Labs  Lab 09/11/22 2022  AMMONIA <10   CBC: Recent Labs  Lab 09/08/22 0501 09/09/22 0623 09/10/22 0531 09/11/22 0659 09/11/22 2022  WBC 9.5 5.6 5.7 5.0 6.1  NEUTROABS  --   --   --   --  3.9  HGB 8.7* 8.2* 9.3* 9.0* 9.9*  HCT 27.4* 25.9* 28.5* 28.0* 31.1*  MCV 89.0 89.6 88.8 89.2 89.4  PLT 234 277 387 422* 495*   Cardiac Enzymes: No results for input(s): "CKTOTAL", "CKMB", "CKMBINDEX", "TROPONINI" in the last 168 hours.  BNP (last 3 results) Recent Labs    11/28/21 0301 12/11/21 2359 12/31/21 1313  BNP 3,057.4* 2,635.7* >4,500.0*    ProBNP (last 3 results) No results for input(s): "PROBNP" in the last 8760 hours.  CBG: Recent Labs  Lab 09/10/22 1702 09/10/22 1812 09/10/22 1958 09/11/22 0739 09/11/22 1119  GLUCAP 66* 160* 166* 207* 199*    Radiological Exams on Admission: No results found.  EKG: I independently viewed the EKG done and my findings are as followed: Normal sinus rhythm at rate of 61 bpm with increased PR interval  Assessment/Plan Present on Admission:  GI bleed  Stage 3b chronic kidney disease (HCC)  BPH (benign prostatic hyperplasia)  CAD,  multiple vessel  Chronic combined systolic and diastolic CHF (congestive heart failure) /EF 30 to 35 % and G3DD  Gastrointestinal hemorrhage  GERD (gastroesophageal reflux disease)  Mixed hyperlipidemia  Moderate Pulmonary HTN (HCC)  Primary hypertension  Principal Problem:   GI bleed Active Problems:   Chronic combined systolic and diastolic CHF (congestive heart failure) /EF 30 to 35 % and G3DD   Moderate Pulmonary HTN (HCC)   CAD, multiple vessel   Stage 3b chronic kidney disease (HCC)   Primary hypertension   Mixed hyperlipidemia   GERD (gastroesophageal reflux disease)   BPH (benign prostatic hyperplasia)   Gastrointestinal hemorrhage  Acute blood loss anemia due to GI bleed Gastrointestinal hemorrhage due to large gastric ulcer H/H= 9.9/31.1 (this was 9.0/28.0 prior to discharge today-09/14/2022) Hemoccult was positive Continue IV Protonix drip Gastroenterologist was consulted and will see patient in the morning per ED physician  Hyponatremia Na 134, continue IV hydration  Hypoalbuminemia possibly secondary to moderate protein calorie malnutrition Albumin 2.3, protein supplements will be provided  T2DM with uncontrolled hyperglycemia Hemoglobin A1c was 11.0 on 09/03/2022 Continue Semglee 10 units nightly and adjust dose accordingly Continue ISS and hypoglycemic protocol Glimepiride and metformin will be held at this time  GERD Continue Protonix  Dysphagia Continue pured diet and thin liquids per speech pathologist recommendation  BPH Continue Flomax and Proscar  Chronic combined systolic and diastolic CHF/moderate pulm hypertension Echocardiogram done in December 2022 showed EF of 30 to 35% and G3 DD/pulmonary hypertension Temporarily hold Coreg, BiDil due to soft BP Temporarily hold Bumex due to soft BP  Mixed hyperlipidemia Continue statins  Essential hypertension Hold BP meds at this time due to soft BP  CAD Temporarily hold Coreg, BiDil due to  soft BP Temporarily hold aspirin and Plavix due to GI bleed  CKD 3B Stable  DVT prophylaxis: SCDs  Code Status: DNR  Consults: Gastroenterology  Family Communication: None at bedside  Severity of Illness: The appropriate patient status for this patient is OBSERVATION. Observation status is judged to be reasonable and necessary in order to provide the required intensity of service to ensure the patient's safety. The patient's presenting  symptoms, physical exam findings, and initial radiographic and laboratory data in the context of their medical condition is felt to place them at decreased risk for further clinical deterioration. Furthermore, it is anticipated that the patient will be medically stable for discharge from the hospital within 2 midnights of admission.   Author: Bernadette Hoit, DO 09/12/2022 3:44 AM  For on call review [email or web address].

## 2022-09-11 NOTE — ED Notes (Signed)
Patient refusing to receive IV medication. Continuously swats at hand when attempting to give medications. Patient attempted twice to remove IV from arm.

## 2022-09-11 NOTE — Discharge Instructions (Signed)
1)Repeat CBC and BMP blood test on Monday, 09/15/2022 2)Avoid ibuprofen/Advil/Aleve/Motrin/Goody Powders/Naproxen/BC powders/Meloxicam/Diclofenac/Indomethacin and other Nonsteroidal anti-inflammatory medications as these will make you more likely to bleed and can cause stomach ulcers, can also cause Kidney problems.  3) please check blood sugars 3 times daily and give glucose tablets if blood sugar is low

## 2022-09-11 NOTE — Discharge Summary (Addendum)
Randy [name], is a 81 y.o. male  DOB Mar 13, 1941  MRN [id number].  Admission date:  09/03/2022  Admitting Physician  Barton Dubois, MD  Discharge Date:  09/11/2022   Primary MD  Jilda Panda, MD  Recommendations for primary care physician for things to follow:   1)Repeat CBC and BMP blood test on Monday, 09/15/2022 2)Avoid ibuprofen/Advil/Aleve/Motrin/Goody Powders/Naproxen/BC powders/Meloxicam/Diclofenac/Indomethacin and other Nonsteroidal anti-inflammatory medications as these will make you more likely to bleed and can cause stomach ulcers, can also cause Kidney problems.  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 ([place]) Active Problems:   Chronic combined systolic and diastolic CHF (congestive heart failure) /EF 30 to 35 % and G3DD   Moderate Pulmonary HTN (HCC)   AKI-CKD 3B   CAD, multiple vessel   Primary hypertension   Mixed hyperlipidemia   Chronic systolic CHF (congestive heart failure) (HCC)   GERD (gastroesophageal reflux disease)   BPH (benign prostatic hyperplasia)   Gastrointestinal hemorrhage   Hemorrhagic shock (HCC)   Hypokalemia      Past Medical History:  Diagnosis Date   Acute systolic CHF (congestive heart failure) ([place]) 11/16/2021   CAD, 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   Hearing loss    HTN (hypertension)     Past Surgical History:  Procedure Laterality Date   ESOPHAGOGASTRODUODENOSCOPY (EGD) WITH PROPOFOL N/A 11/26/2021   Procedure: ESOPHAGOGASTRODUODENOSCOPY (EGD) WITH PROPOFOL;  Surgeon: Doran Stabler, MD;  Location: [facility];   Service: Gastroenterology;  Laterality: N/A;   EYE SURGERY     IABP INSERTION N/A 01/30/2021   Procedure: IABP Insertion;  Surgeon: Leonie Man, MD;  Location: [facility];  Service: Cardiovascular;  Laterality: N/A;   RIGHT/LEFT HEART CATH AND CORONARY ANGIOGRAPHY N/A 01/30/2021   Procedure: RIGHT/LEFT HEART CATH AND CORONARY ANGIOGRAPHY;  Surgeon: Leonie Man, MD;  Location: [facility];  Service: Cardiovascular;  Laterality: N/A;     HPI  from the history and physical done on the day of admission:   HPI: Randy [name] is a 81 y.o. male with medical history significant of chronic systolic heart failure, hypertension, hyperlipidemia, coronary artery disease, GERD and BPH; who was brought to the hospital secondary to change in mentation and elevated blood sugar. Patient mentation not allowing for him to participate in interview; currently agitated confused and not following commands. CT head demonstrated no acute intracranial abnormalities; blood work suggesting the presence of hyperosmolar hyperglycemic crisis with blood sugar in the 970 range.  Normal bicarb and worsening renal function. No acute signs of infection appreciated.   IV fluids, insulin therapy initiated.  TRH contacted to place patient in the hospital for further evaluation and management.   Review of Systems: Unable  to review all systems due to lack of cooperation from patient    Hospital Course:     Brief hospital course: Randy [name] is a 81 y.o. male with medical history significant of chronic systolic heart failure, hypertension, hyperlipidemia, coronary artery disease, GERD and BPH; who was brought to the hospital secondary to change in mentation and elevated blood sugar. Patient mentation not allowing for him to participate in interview; currently agitated confused and not following commands. CT head demonstrated no acute intracranial abnormalities; blood work suggesting the presence of  hyperosmolar hyperglycemic crisis with blood sugar in the 970 range.  Normal bicarb and worsening renal function. No acute signs of infection appreciated.   IV fluids, insulin therapy initiated.  TRH contacted to place patient in the hospital for further evaluation and management.   Assessment and Plan: Acute metabolic encephalopathy -Multifactorial: In the setting of hypernatremia, hyperglycemic state, uremia/worsening renal function and dehydration -Overall mentation has improved significantly -affect is appropriate, episodes of confusion, disorientation and agitation from time to time -Continue constant reorientation and continue the use of as needed Haldol. 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 -Currently hemodynamically stable and not requiring pressors support. -Hgb stable posttransfusion -May benefit from biweekly EPO/Procrit agent -Repeat CBC within 1 week   Gastrointestinal hemorrhage/large gastric ulcer --Status post EGD on 09/09/2022 with suspicious large gastric ulcers, biopsies pending -Continue Protonix twice daily   BPH (benign prostatic hyperplasia) --Start Flomax and Proscar  -No signs of urinary retention currently.   GERD (gastroesophageal reflux disease)/gastric ulcer -Continue PPI. -CT angiogram negative identifying acute source of  bleeding.  Bleeding episodes reported -Stable hemoglobin after transfusion;  -Okay to restart aspirin and Plavix on 09/18/2022   Dysphagia---speech pathology consult appreciated, pured diet recommended, thin liquids   Chronic combined diastolic and systolic CHF (congestive heart failure)/moderate pulmonary hypertension -Echo from December 2022 with EF of 30 to 35% and grade 3 diastolic dysfunction/moderate pulmonary hypertension -Stable and currently compensated --Decrease Coreg to 3.125 mg twice daily due to soft BP -Decrease BiDil to half a tablet twice daily due to soft BP -Change Bumex to 1 mg daily   Mixed hyperlipidemia -Resume the use of statins when able to tolerate p.o.'s.   Primary hypertension -Blood pressure soft but currently stable and has no longer required need for pressor support.   -Continue to follow vital signs. -Continue holding antihypertensive agents at this point. -Appear to be secondary to GI bleed/hypovolemic shock. -Continue to follow hemoglobin trend and transfuse as needed.    CAD, multiple vessel -No chest pain or shortness of breath currently -.--Decrease Coreg to 3.125 mg twice daily due to soft BP -Decrease BiDil to half a tablet twice daily due to soft BP -Change Bumex to 1 mg daily -Hold aspirin and Plavix until 09/18/2028 due to GI bleed/gastric ulcer   AKI-CKD 3B -Improved with hydration -Repeat BMP as outpatient within a week advised  Discharge Condition: stable  Follow UP  Contact information for after-discharge care     [facility] .   Service: Skilled Nursing Contact information: [address] [place] [phone]                     Consults obtained -GI  Diet and Activity recommendation:  As advised  Discharge Instructions    Discharge Instructions     Call MD for:  difficulty breathing, headache or visual disturbances    Complete by: As directed    Call MD for:  persistant dizziness or light-headedness   Complete by: As directed    Call MD for:  persistant nausea and vomiting   Complete by: As directed    Call MD for:  temperature >100.4   Complete by: As directed    Diet - low sodium heart healthy   Complete by: As directed    Discharge instructions   Complete by: As directed    1)Repeat CBC and BMP blood test on Monday, 09/15/2022 2)Avoid ibuprofen/Advil/Aleve/Motrin/Goody Powders/Naproxen/BC powders/Meloxicam/Diclofenac/Indomethacin and other Nonsteroidal anti-inflammatory medications as these will make you more likely to bleed and can cause stomach ulcers, can also cause Kidney problems.  3) please check blood sugars 3 times daily and give glucose tablets if blood sugar is low   Increase activity slowly   Complete by: As directed        Discharge Medications     Allergies as of 09/11/2022   No Known Allergies      Medication List     STOP taking these medications    Jardiance 10 MG Tabs tablet Generic drug: empagliflozin   Torsemide 40 MG Tabs Replaced by: bumetanide 1 MG tablet       TAKE these medications    aspirin EC 81 MG tablet Take 1 tablet (81 mg total) by mouth daily with breakfast. Swallow whole. Start taking on: September 18, 2022 What changed:  when to take this These instructions start on September 18, 2022. If you are unsure what to do until then, ask your doctor or other care provider.   atorvastatin 80 MG tablet Commonly known as: LIPITOR Take 1 tablet (80 mg total) by mouth daily.   bumetanide 1 MG tablet Commonly known as: BUMEX Take 1 tablet (1 mg total) by mouth daily. Replaces: Torsemide 40 MG Tabs   carvedilol 3.125 MG tablet Commonly known as: COREG Take 2 tablets (6.25 mg total) by mouth 2 (two) times daily with a meal. Start taking on: September 22, 2022 What changed:  medication strength These instructions start on September 22, 2022. If you are  unsure what to do until then, ask your doctor or other care provider.   clopidogrel 75 MG tablet Commonly known as: PLAVIX Take 1 tablet (75 mg total) by mouth daily. Start taking on: September 18, 2022 What changed: These instructions start on September 18, 2022. If you are unsure what to do until then, ask your doctor or other care provider.   cyanocobalamin 500 MCG tablet Commonly known as: VITAMIN B12 Take 1 tablet (500 mcg total) by mouth daily.   divalproex 125 MG DR tablet Commonly known as: DEPAKOTE Take 375 mg by mouth 3 (three) times daily.   ferrous sulfate 325 (65 FE) MG tablet Take 1 tablet (325 mg total) by mouth 2 (two) times daily with a meal.   finasteride 5 MG tablet Commonly known as: PROSCAR Take 1 tablet (5 mg total) by  mouth daily.   glimepiride 2 MG tablet Commonly known as: Amaryl Take 1 tablet (2 mg total) by mouth daily with breakfast.   glucose 4 GM chewable tablet Chew 1 tablet (4 g total) by mouth as needed for low blood sugar.   isosorbide-hydrALAZINE 20-37.5 MG tablet Commonly known as: BIDIL Take 0.5 tablets by mouth 2 (two) times daily. What changed: how much to take   LORazepam 0.5 MG tablet Commonly known as: Ativan Take 1 tablet (0.5 mg total) by mouth every 12 (twelve) hours as needed for anxiety.   metFORMIN 500 MG tablet Commonly known as: Glucophage Take 1 tablet (500 mg total) by mouth 2 (two) times daily with a meal. What changed: when to take this   mirtazapine 15 MG tablet Commonly known as: REMERON Take 7.5 mg by mouth daily.   OLANZapine 7.5 MG tablet Commonly known as: ZYPREXA Take by mouth.   pantoprazole 40 MG tablet Commonly known as: PROTONIX Take 1 tablet (40 mg total) by mouth 2 (two) times daily before a meal.   potassium chloride 10 MEQ tablet Commonly known as: KLOR-CON Take 1 tablet (10 mEq total) by mouth daily. Take While taking Bumex   tamsulosin 0.4 MG Caps capsule Commonly known as: FLOMAX Take  2 capsules (0.8 mg total) by mouth daily.       Major procedures and Radiology Reports - PLEASE review detailed and final reports for all details, in brief -   CT ANGIO GI BLEED  Result Date: 09/05/2022 CLINICAL DATA:  This impaction with rectal bleeding EXAM: CTA ABDOMEN AND PELVIS WITHOUT AND WITH CONTRAST TECHNIQUE: Multidetector CT imaging of the abdomen and pelvis was performed using the standard protocol during bolus administration of intravenous contrast. Multiplanar reconstructed images and MIPs were obtained and reviewed to evaluate the vascular anatomy. RADIATION DOSE REDUCTION: This exam was performed according to the departmental dose-optimization program which includes automated exposure control, adjustment of the mA and/or kV according to patient size and/or use of iterative reconstruction technique. CONTRAST:  69m OMNIPAQUE IOHEXOL 350 MG/ML SOLN COMPARISON:  None Available. FINDINGS: VASCULAR Aorta: No evidence abdominal aortic aneurysm or dissection. Atherosclerotic calcifications. Patent. Celiac: Patent.  Atherosclerotic calcifications at the origin. SMA: Patent.  Atherosclerotic calcifications at the origin. Renals: Patent. Atherosclerotic calcifications the origin on the right. IMA: Patent. Inflow: Patent bilaterally.  Atherosclerotic calcifications. Proximal Outflow: Patent bilaterally. Veins: Within normal limits. Review of the MIP images confirms the above findings. NON-VASCULAR Lower chest: Lung bases are clear. Hepatobiliary: Subcentimeter cyst in segment 4A (series 15/image 10), benign. No follow-up is recommended. Scattered calcified granulomata. Gallbladder is unremarkable. No intrahepatic or extrahepatic ductal dilatation. Pancreas: Within normal limits. Spleen: Calcified splenic granulomata. Adrenals/Urinary Tract: Adrenal glands are within normal limits. Kidneys are within normal limits. No hydronephrosis. Thick-walled bladder, likely reflecting chronic bladder outlet  obstruction. Stomach/Bowel: Stomach is within normal limits. No evidence of bowel obstruction. Normal appendix (series 15/image 47). No bowel wall thickening or inflammatory changes. Specifically, no rectal wall thickening or mass is evident on CT. No active extravasation of contrast following contrast administration to suggest active GI bleeding. Lymphatic: No suspicious abdominopelvic lymphadenopathy. Reproductive: Prostatomegaly, indenting the base of the bladder, reflecting BPH. Other: No abdominopelvic ascites. Musculoskeletal: Degenerative changes of the lumbar spine. Grade 1 spondylolisthesis at L5-S1. Mild superior endplate changes with Smalls node deformity at L4, likely chronic. IMPRESSION: No evidence of active GI bleeding. No rectal wall thickening or mass is evident on CT. BPH with chronic bladder outlet obstruction. Patent abdominal  vasculature. Additional ancillary findings as above. Electronically Signed   By: Julian Hy M.D.   On: 09/05/2022 19:14   DG CHEST PORT 1 VIEW  Result Date: 09/05/2022 CLINICAL DATA:  Placement of central venous catheter EXAM: PORTABLE CHEST 1 VIEW COMPARISON:  Previous studies including the examination of 09/03/2022 FINDINGS: Transverse diameter of heart is increased. There are no signs of pulmonary edema or focal pulmonary consolidation. There is no pleural effusion or pneumothorax. There is interval placement of right IJ central venous catheter with its tip in superior vena cava. IMPRESSION: Tip of right IJ central venous catheter is seen in superior vena cava. There is no pneumothorax. Electronically Signed   By: Elmer Picker M.D.   On: 09/05/2022 18:10   [place] EKG SITE RITE  Result Date: 09/05/2022 If Site Rite image not attached, placement could not be confirmed due to current cardiac rhythm.  CT Head Wo Contrast  Result Date: 09/03/2022 CLINICAL DATA:  Altered mental status EXAM: CT HEAD WITHOUT CONTRAST TECHNIQUE: Contiguous axial images were  obtained from the base of the skull through the vertex without intravenous contrast. RADIATION DOSE REDUCTION: This exam was performed according to the departmental dose-optimization program which includes automated exposure control, adjustment of the mA and/or kV according to patient size and/or use of iterative reconstruction technique. COMPARISON:  None Available. FINDINGS: Brain: No acute intracranial findings are seen. There are no signs of bleeding within the cranium. Minimal calcifications are seen in basal ganglia. Cortical sulci are prominent. There is small area of encephalomalacia in the right frontal cortex. There is decreased density in periventricular and subcortical white matter. Vascular: Scattered arterial calcifications are seen. Skull: Unremarkable. Sinuses/Orbits: Unremarkable. Other: None. IMPRESSION: No acute intracranial findings are seen in noncontrast CT brain. Atrophy. 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 Electronically Signed   By: Elmer Picker M.D.   On: 09/03/2022 15:24   DG Chest Port 1 View  Result Date: 09/03/2022 CLINICAL DATA:  Altered mental status EXAM: PORTABLE CHEST 1 VIEW COMPARISON:  Radiograph 12/30/2021 FINDINGS: Unchanged cardiomediastinal silhouette. There is no focal airspace consolidation. There is no pleural effusion. No pneumothorax. There is no acute osseous abnormality. Unchanged mildly elevated right hemidiaphragm. IMPRESSION: No evidence of acute cardiopulmonary disease. Unchanged mildly elevated right hemidiaphragm Electronically Signed   By: Maurine Simmering M.D.   On: 09/03/2022 13:50    Micro Results   Recent Results (from the past 240 hour(s))  MRSA Next Gen by PCR, Nasal     Status: None   Collection Time: 09/03/22  4:36 PM   Specimen: Nasal Mucosa; Nasal Swab  Result Value Ref Range Status   MRSA by PCR Next Gen NOT DETECTED NOT DETECTED Final    Comment: (NOTE) The GeneXpert MRSA Assay (FDA approved for NASAL  specimens only), is one component of a comprehensive MRSA colonization surveillance program. It is not intended to diagnose MRSA infection nor to guide or monitor treatment for MRSA infections. Test performance is not FDA approved in patients less than 64 years old. Performed at [facility], [address], [place], [place] [address]    Today   Subjective    Randy [name] today has no new complaints -Overnight patient has some behavioral challenges including on and off agitation -- iPad language interpreter used communicate with patient -No further hypoglycemic episodes -Oral intake is inconsistent   Patient has been seen and examined prior to discharge   Objective   Blood pressure (!) 113/52, pulse 61, temperature  97.7 F (36.5 C), resp. rate 20, height '5\' 2"'$  (1.575 m), weight 55.6 kg, SpO2 (!) 79 %.   Intake/Output Summary (Last 24 hours) at 09/11/2022 1148 Last data filed at 09/11/2022 0641 Gross per 24 hour  Intake 1285.28 ml  Output 200 ml  Net 1085.28 ml   Exam Gen:- Awake Alert, no acute distress, cooperative HEENT:- [place].AT, No sclera icterus Neck-Supple Neck,No JVD,.  Lungs-  CTAB , good air movement bilaterally CV- S1, S2 normal, regular Abd-  +ve B.Sounds, Abd Soft, No tenderness,    Extremity/Skin:- No  edema,   good pulses Psych-affect is appropriate, episodes of confusion, disorientation and agitation from time to time Neuro-generalized weakness, no new focal deficits, no tremors    Data Review   CBC w Diff:  Lab Results  Component Value Date   WBC 5.0 09/11/2022   HGB 9.0 (L) 09/11/2022   HCT 28.0 (L) 09/11/2022   PLT 422 (H) 09/11/2022   LYMPHOPCT 3 09/03/2022   MONOPCT 6 09/03/2022   EOSPCT 0 09/03/2022   BASOPCT 1 09/03/2022   CMP:  Lab Results  Component Value Date   NA 135 09/11/2022   K 4.4 09/11/2022   CL 112 (H) 09/11/2022   CO2 20 (L) 09/11/2022   BUN 9 09/11/2022   CREATININE 1.48 (H) 09/11/2022   PROT 8.1 09/03/2022    ALBUMIN 3.1 (L) 09/03/2022   BILITOT 0.8 09/03/2022   ALKPHOS 98 09/03/2022   AST 37 09/03/2022   ALT 17 09/03/2022   Total Discharge time is about 33 minutes  Roxan Hockey M.D on 09/11/2022 at 11:48 AM  Go to [email or web address] -  for contact info  [facility] - Office  [phone]

## 2022-09-11 NOTE — ED Notes (Signed)
Pt ambulatory in hallway with no assistance needed. Pt assisted back to treatment room BP obtained 118/49 (69).

## 2022-09-11 NOTE — ED Triage Notes (Signed)
Pt brought in by [facility] from [facility], pt was discharged from this hospital this afternoon with multiple discharge diagnosis, including gastrointestinal hemorrhage due to probable stomach ulcer-pt had EGD during his stay. EMS reports staff sent him here for bright red blood in stool. Pt has hx of advanced dementia with behavioral disturbances.

## 2022-09-11 NOTE — Progress Notes (Signed)
Pt agitated throughout noc.  OOB several times by self, does not follow commands to stay in bed or call for assist.  Pt has pulled off tele leads and won't leave in place, has pulled off malewick x 3, pulled out IV, urinated on floor, threw drinks across room, hit this nurse when restarting new IV line.  Pt given IM haldol x 1 dose with minimal relief.  Pt also having clots from rectum this evening.  Pt cleansed and assisted back to bed.  Provider notified, no new orders

## 2022-09-12 DIAGNOSIS — K922 Gastrointestinal hemorrhage, unspecified: Secondary | ICD-10-CM | POA: Diagnosis not present

## 2022-09-12 DIAGNOSIS — I2510 Atherosclerotic heart disease of native coronary artery without angina pectoris: Secondary | ICD-10-CM | POA: Diagnosis not present

## 2022-09-12 DIAGNOSIS — N400 Enlarged prostate without lower urinary tract symptoms: Secondary | ICD-10-CM | POA: Diagnosis not present

## 2022-09-12 DIAGNOSIS — K254 Chronic or unspecified gastric ulcer with hemorrhage: Secondary | ICD-10-CM | POA: Diagnosis not present

## 2022-09-12 LAB — COMPREHENSIVE METABOLIC PANEL
ALT: 15 U/L (ref 0–44)
AST: 23 U/L (ref 15–41)
Albumin: 2.1 g/dL — ABNORMAL LOW (ref 3.5–5.0)
Alkaline Phosphatase: 61 U/L (ref 38–126)
Anion gap: 4 — ABNORMAL LOW (ref 5–15)
BUN: 9 mg/dL (ref 8–23)
CO2: 19 mmol/L — ABNORMAL LOW (ref 22–32)
Calcium: 7.1 mg/dL — ABNORMAL LOW (ref 8.9–10.3)
Chloride: 114 mmol/L — ABNORMAL HIGH (ref 98–111)
Creatinine, Ser: 1.41 mg/dL — ABNORMAL HIGH (ref 0.61–1.24)
GFR, Estimated: 49 mL/min — ABNORMAL LOW (ref >60)
Glucose, Bld: 154 mg/dL — ABNORMAL HIGH (ref 70–99)
Potassium: 4 mmol/L (ref 3.5–5.1)
Sodium: 137 mmol/L (ref 135–145)
Total Bilirubin: 0.5 mg/dL (ref 0.3–1.2)
Total Protein: 5.3 g/dL — ABNORMAL LOW (ref 6.5–8.1)

## 2022-09-12 LAB — CBC
HCT: 27.5 % — ABNORMAL LOW (ref 39.0–52.0)
Hemoglobin: 8.6 g/dL — ABNORMAL LOW (ref 13.0–17.0)
MCH: 28.4 pg (ref 26.0–34.0)
MCHC: 31.3 g/dL (ref 30.0–36.0)
MCV: 90.8 fL (ref 80.0–100.0)
Platelets: 465 10*3/uL — ABNORMAL HIGH (ref 150–400)
RBC: 3.03 MIL/uL — ABNORMAL LOW (ref 4.22–5.81)
RDW: 15.2 % (ref 11.5–15.5)
WBC: 5.2 10*3/uL (ref 4.0–10.5)
nRBC: 0 % (ref 0.0–0.2)

## 2022-09-12 LAB — HEMOGLOBIN AND HEMATOCRIT, BLOOD
HCT: 27.4 % — ABNORMAL LOW (ref 39.0–52.0)
Hemoglobin: 8.6 g/dL — ABNORMAL LOW (ref 13.0–17.0)

## 2022-09-12 LAB — GLUCOSE, CAPILLARY
Glucose-Capillary: 128 mg/dL — ABNORMAL HIGH (ref 70–99)
Glucose-Capillary: 125 mg/dL — ABNORMAL HIGH (ref 70–99)
Glucose-Capillary: 118 mg/dL — ABNORMAL HIGH (ref 70–99)

## 2022-09-12 MED ORDER — TAMSULOSIN HCL 0.4 MG PO CAPS
0.8000 mg | ORAL_CAPSULE | Freq: Every day | ORAL | Status: DC
  Administered 2022-09-12 – 2022-09-15 (×4): 0.8 mg via ORAL
  Filled 2022-09-12 (×4): qty 2

## 2022-09-12 MED ORDER — INSULIN ASPART 100 UNIT/ML IJ SOLN
0.0000 [IU] | Freq: Three times a day (TID) | INTRAMUSCULAR | Status: DC
  Administered 2022-09-13: 1 [IU] via SUBCUTANEOUS
  Administered 2022-09-14: 2 [IU] via SUBCUTANEOUS

## 2022-09-12 MED ORDER — OLANZAPINE 5 MG PO TABS
7.5000 mg | ORAL_TABLET | Freq: Every day | ORAL | Status: DC
  Administered 2022-09-13 – 2022-09-14 (×2): 7.5 mg via ORAL
  Filled 2022-09-12 (×2): qty 2

## 2022-09-12 MED ORDER — HALOPERIDOL LACTATE 5 MG/ML IJ SOLN
2.0000 mg | Freq: Once | INTRAMUSCULAR | Status: AC
  Administered 2022-09-12: 2 mg via INTRAMUSCULAR
  Filled 2022-09-12: qty 1

## 2022-09-12 MED ORDER — HALOPERIDOL LACTATE 5 MG/ML IJ SOLN
2.0000 mg | Freq: Four times a day (QID) | INTRAMUSCULAR | Status: DC | PRN
  Administered 2022-09-12 (×2): 2 mg via INTRAMUSCULAR
  Filled 2022-09-12 (×2): qty 1

## 2022-09-12 MED ORDER — INSULIN ASPART 100 UNIT/ML IJ SOLN: 0.0000 [IU] | Freq: Every day | INTRAMUSCULAR | Status: DC

## 2022-09-12 MED ORDER — GLUCERNA SHAKE PO LIQD
237.0000 mL | Freq: Three times a day (TID) | ORAL | Status: DC
  Administered 2022-09-12 – 2022-09-13 (×3): 237 mL via ORAL
  Filled 2022-09-12 (×2): qty 237

## 2022-09-12 MED ORDER — PANTOPRAZOLE SODIUM 40 MG PO TBEC
40.0000 mg | DELAYED_RELEASE_TABLET | Freq: Two times a day (BID) | ORAL | Status: DC
  Administered 2022-09-13 – 2022-09-15 (×5): 40 mg via ORAL
  Filled 2022-09-12 (×5): qty 1

## 2022-09-12 MED ORDER — LORAZEPAM 2 MG/ML IJ SOLN
1.0000 mg | Freq: Once | INTRAMUSCULAR | Status: AC
  Administered 2022-09-12: 1 mg via INTRAVENOUS
  Filled 2022-09-12: qty 1

## 2022-09-12 MED ORDER — FINASTERIDE 5 MG PO TABS
5.0000 mg | ORAL_TABLET | Freq: Every day | ORAL | Status: DC
  Administered 2022-09-12 – 2022-09-15 (×4): 5 mg via ORAL
  Filled 2022-09-12 (×4): qty 1

## 2022-09-12 MED ORDER — HALOPERIDOL LACTATE 5 MG/ML IJ SOLN
2.0000 mg | Freq: Once | INTRAMUSCULAR | Status: AC
  Administered 2022-09-12: 2 mg via INTRAVENOUS
  Filled 2022-09-12: qty 1

## 2022-09-12 MED ORDER — HALOPERIDOL LACTATE 5 MG/ML IJ SOLN: 2.0000 mg | Freq: Once | INTRAMUSCULAR | Status: DC

## 2022-09-12 MED ORDER — INSULIN GLARGINE-YFGN 100 UNIT/ML ~~LOC~~ SOLN
10.0000 [IU] | Freq: Every day | SUBCUTANEOUS | Status: DC
  Filled 2022-09-12 (×3): qty 0.1

## 2022-09-12 MED ORDER — ATORVASTATIN CALCIUM 40 MG PO TABS
80.0000 mg | ORAL_TABLET | Freq: Every day | ORAL | Status: DC
  Administered 2022-09-12 – 2022-09-15 (×4): 80 mg via ORAL
  Filled 2022-09-12 (×4): qty 2

## 2022-09-12 NOTE — ED Notes (Signed)
Pt all over the bed, pulling at IV and ripping leads, BP & pulse ox off.

## 2022-09-12 NOTE — Progress Notes (Signed)
Patient to floor from ED. Patient is resting comfortable with a safety sitter at his bedside. Patient unable to answer admission questions at this time.

## 2022-09-12 NOTE — Progress Notes (Signed)
Patient attempting to climb out of bed and remove IV, becoming agitated. Dr. Josephine Cables notified and medication ordered.

## 2022-09-12 NOTE — ED Notes (Signed)
Pts daughter updated by phone.

## 2022-09-12 NOTE — Consult Note (Signed)
Patient with recent admission to [facility] 9/13 - 9/21 for acute metabolic encephalopathy in the setting of hyperosmolar 9 ketotic state due to type 2 diabetes.  After being disimpacted he had multiple large bloody bowel movements/melena.  He shortly after became obtunded, hypotensive, required IV fluids and vasopressors.  His hemoglobin dropped from 12.9-7.7 and was given multiple units of blood.  He has CTA without obvious source of bleeding.  He underwent EGD 9/19 noting a large, nonbleeding, cratered ulcer.  There was concern for malignancy however pathology with gastric antral and oxyntic mucosa with nonspecific reactive gastropathy.  Sample negative for H. pylori, intestinal metaplasia, dysplasia, or malignancy.  Plan was to continue holding Plavix for an additional 7 days (restart after 09/17/2022).  Prior to discharge she noticed some nausea but denies any abdominal pain.  His hemoglobin was stable on discharge at 9.  Instructions were to remain on PPI twice daily and avoid all NSAIDs.  Patient readmitted overnight, brought in by EMS from SNF, reporting a large bright red stool.  He was amatory in the hallway in the ED without assistance and shortly after arrival he became agitated, attempting to remove IVs and equipment.  ED provider note stated that stool was noted to be large, melanotic.  Thus far this shift, per nursing report he has appeared confused, has been cursing, spitting, and hitting and nursing staff.  He received some sedation in the time of my exam he was sleeping.  He has a Air cabin crew at bedside.  His hemoglobin was 8.6 this morning, and recheck this afternoon also with stable hemoglobin 8.6.  No physical exam performed on patient as he was resting comfortably after he was given sedation for being agitated/combative.  Nursing staff has observed no bowel movement since his admission.  It appears patient is stable from a GI standpoint.  Continue to recommend PPI twice daily and to  not restart Plavix until after 9/27.  Avoid all NSAIDs.  Consider repeating CBC in 1 week.  Patient likely experienced a residual melanotic stool given his prior bleeding ulcer.  GI will sign off.  Venetia Night, MSN, APRN, FNP-BC, AGACNP-BC [facility]

## 2022-09-12 NOTE — Progress Notes (Signed)
PROGRESS NOTE    Randy [name]  EYC:[id number] DOB: [birth year] DOA: 09/11/2022 PCP: Jilda Panda, MD   Brief Narrative:  HPI per Dr. Bernadette Hoit on 09/12/22 HPI: Randy [name] is a 81 y.o. male with medical history significant of hypertension, hyperlipidemia, chronic systolic heart failure, CAD, BPH and GERD who presents to the emergency department via EMS from a skilled nursing facility.  He was admitted on 9/13 due to acute metabolic encephalopathy and discharged today to a skilled nursing facility secondary to hyperosmolar nonketotic state due to type 2 DM and acute blood loss anemia in the setting of GI bleed in which large gastric ulcer was noted status post EGD-09/09/2022 with pending biopsies, he was discharged earlier today to a SNF with Protonix twice daily.  Unfortunately, patient was sent back from SNF after being noted to have a large bright red blood per rectum.   ED Course:  In the emergency department, temperature was 98.4, respiratory rate 17/min, pulse 59 bpm, BP 95/62, O2 sat 97%.  Work-up in the ED showed H/H 9.9/31.1 (this was 9.0/28.0 prior to discharge today), platelets 495, FOBT positive, sodium 134, potassium 4.8, chloride 108, bicarb 20, glucose 251, BUN 18, creatinine 1.65.  Ammonia less than 10, FOBT positive.  **Interim History  Patient had been given Haldol and lorazepam when I saw him and he was resting and very somnolent and drowsy.  He subsequently woke up and became more agitated and had to be given some more Haldol.  GI was consulted for reevaluation given that he reportedly had a large bright red stool and he was ambulatory in the hallway in the ED without assistance for after arrival became agitated attempted to remove his IVs equipment.  Hemoglobin did drop to 8.6 this morning and recheck this afternoon was stable at 8.6.  From a GI standpoint they feel that he is stable and recommending continuing PPI twice daily and not to restart his Plavix until  09/17/2022 and avoiding all NSAIDs.  GI felt that he experienced a melanotic stool that was residual blood.  Likely can go back to SNF since he is stable but will need to ensure that he is not actively bleeding.  Assessment and Plan:  Acute blood loss anemia due to GI bleed Gastrointestinal hemorrhage due to large gastric ulcer H/H= 9.9/31.1 (this was 9.0/28.0 prior to discharge yesterday-09/14/2022) Hemoccult was positive -Continue IV Protonix drip however can be changed to p.o. Protonix 40 mg twice daily and GI recommending holding Plavix until 9/27 -His hemoglobin/hematocrit is now trended down and went from 9.9/31.1 -> 8.6/27.5 -> 8.6/27.4 and stable -Gastroenterologist was consulted evaluated the situation and they felt that the patient had some residual blood and did not feel that he was actively bleeding -GI recommending no further intervention at this point they feel that he is stable from a GI standpoint recommending repeating CBC and feel that he experienced a residual melanotic stool given his prior bleeding large ulcer -We will repeat CBC in the morning and likely if stable can be discharged back to SNF   Hyponatremia -Na 134, continued IV hydration with normal saline at 75 mils per hour and will stop and now improved to 137 -Continue monitor and trend and repeat CMP in a.m.   Hypoalbuminemia possibly secondary to moderate protein calorie malnutrition -Albumin 2.3 and trended down to 2.1, protein supplements will be provided   T2DM with uncontrolled hyperglycemia -Hemoglobin A1c was 11.0 on 09/03/2022 -Continue Semglee 10 units nightly and adjust dose accordingly -Continue ISS  and hypoglycemic protocol -Continue monitor CBGs per protocol -Glimepiride and metformin will be held at this time can be resumed at discharge   GERD -Continue pantoprazole 40 mg p.o. 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 -Echocardiogram done in December 2022 showed EF of 30 to 35% and G3 DD/pulmonary hypertension -Temporarily hold Coreg, BiDil due to soft BP -Temporarily hold Bumex due to soft BP   Mixed Hyperlipidemia -Continue Atorvastain 80 mg po Daily    Essential hypertension -Hold BP meds at this time due to soft BP   CAD -Temporarily hold Coreg, BiDil due to soft BP -Temporarily hold aspirin and Plavix due to GI bleed and GI recommends resuming Plavix 0/35/0093   CKD 3a Metabolic acidosis -Stable and improving -Patient's BUN/creatinine went from 9/1.48 and trended up to 10/1.65 and is now back down to 9/1.41 -Patient CO2 is now 19, anion gap is 4, chloride level is 114 -Avoid further nephrotoxic medications, contrast dyes, hypotension and dehydration to ensure adequate renal perfusion and will need to renally dose medications -Repeat CMP in a.m.  Thrombocytosis -Patient's platelet count went from 422 and trended up to 495 is now 465 -Continue monitor and trend and repeat CBC in a.m.  DVT prophylaxis: SCDs Start: 09/12/22 0436    Code Status: DNR Family Communication: No family currently at bedside  Disposition Plan:  Level of care: Telemetry Status is: Observation The patient remains OBS appropriate and will d/c before 2 midnights.  GI signed off and he is having no overt bleeding noted.  He continues to have behavioral issues but is from a long-term memory care unit and can likely be discharged back to his facility once he receives a new auth   Consultants:  Gastroenterology  Procedures:  None   Antimicrobials:  Anti-infectives (From admission, onward)    None       Subjective: Seen and examined at bedside and he is extremely somnolent and drowsy and difficult to arouse given that he just received Haldol and lorazepam earlier this morning given his behavioral issues.  Subsequently  when he woke up later in the afternoon he became extremely agitated and started spitting and kicking to be given more Haldol.  GI is evaluated and cleared the patient from their standpoint.  Likely he will be discharged back to his facility given that the bleeding that he had was old melanotic blood  Objective: Vitals:   09/11/22 2200 09/12/22 0400 09/12/22 0436 09/12/22 0614  BP: (!) 100/59 (!) 104/34  (!) 113/57  Pulse: 64 62    Resp:  18    Temp:  (!) 97.5 F (36.4 C)    TempSrc:  Axillary    SpO2: 100% 100% 100%   Weight:      Height:        Intake/Output Summary (Last 24 hours) at 09/12/2022 1748 Last data filed at 09/12/2022 0505 Gross per 24 hour  Intake 1730.56 ml  Output --  Net 1730.56 ml   Filed Weights   09/11/22 2055  Weight: 55.6 kg   Examination: Physical Exam:  Constitutional: Thin [place] male who was extremely somnolent and drowsy and difficult to arouse given that he received Haldol and Ativan Respiratory: Diminished to auscultation bilaterally, no wheezing, rales, rhonchi or crackles. Normal respiratory effort and patient is not tachypenic. No accessory muscle use.  Unlabored breathing Cardiovascular: RRR,  no murmurs / rubs / gallops. S1 and S2 auscultated. No extremity edema.  Abdomen: Soft, non-tender, non-distended. Bowel sounds positive.  GU: Deferred. Musculoskeletal: No clubbing / cyanosis of digits/nails. No joint deformity upper and lower extremities.  Skin: No rashes, lesions, ulcers on limited skin evaluation. No induration; Warm and dry.  Neurologic: Does not follow commands given his extreme somnolence Psychiatric: Impaired judgment and insight given his current condition  Data Reviewed: I have personally reviewed following labs and imaging studies  CBC: Recent Labs  Lab 09/09/22 0623 09/10/22 0531 09/11/22 0659 09/11/22 2022 09/12/22 0522 09/12/22 1453  WBC 5.6 5.7 5.0 6.1 5.2  --   NEUTROABS  --   --   --  3.9  --   --   HGB 8.2*  9.3* 9.0* 9.9* 8.6* 8.6*  HCT 25.9* 28.5* 28.0* 31.1* 27.5* 27.4*  MCV 89.6 88.8 89.2 89.4 90.8  --   PLT 277 387 422* 495* 465*  --    Basic Metabolic Panel: Recent Labs  Lab 09/09/22 0623 09/10/22 0531 09/10/22 0846 09/11/22 0659 09/11/22 2022 09/12/22 0522  NA 141 138  --  135 134* 137  K 3.2* 3.4*  --  4.4 4.8 4.0  CL 115* 112*  --  112* 108 114*  CO2 19* 20*  --  20* 20* 19*  GLUCOSE 63* 89  --  204* 251* 154*  BUN 15 10  --  '9 10 9  '$ CREATININE 1.71* 1.57*  --  1.48* 1.65* 1.41*  CALCIUM 7.2* 7.6*  --  7.3* 7.6* 7.1*  MG  --   --  1.7  --   --   --    GFR: Estimated Creatinine Clearance: 30.7 mL/min (A) (by C-G formula based on SCr of 1.41 mg/dL (H)). Liver Function Tests: Recent Labs  Lab 09/11/22 2022 09/12/22 0522  AST 28 23  ALT 17 15  ALKPHOS 72 61  BILITOT 0.5 0.5  PROT 5.9* 5.3*  ALBUMIN 2.3* 2.1*   No results for input(s): "LIPASE", "AMYLASE" in the last 168 hours. Recent Labs  Lab 09/11/22 2022  AMMONIA <10   Coagulation Profile: Recent Labs  Lab 09/11/22 2022  INR 1.0   Cardiac Enzymes: No results for input(s): "CKTOTAL", "CKMB", "CKMBINDEX", "TROPONINI" in the last 168 hours. BNP (last 3 results) No results for input(s): "PROBNP" in the last 8760 hours. HbA1C: No results for input(s): "HGBA1C" in the last 72 hours. CBG: Recent Labs  Lab 09/10/22 1812 09/10/22 1958 09/11/22 0739 09/11/22 1119 09/12/22 1049  GLUCAP 160* 166* 207* 199* 128*   Lipid Profile: No results for input(s): "CHOL", "HDL", "LDLCALC", "TRIG", "CHOLHDL", "LDLDIRECT" in the last 72 hours. Thyroid Function Tests: No results for input(s): "TSH", "T4TOTAL", "FREET4", "T3FREE", "THYROIDAB" in the last 72 hours. Anemia Panel: No results for input(s): "VITAMINB12", "FOLATE", "FERRITIN", "TIBC", "IRON", "RETICCTPCT" in the last 72 hours. Sepsis Labs: No results for input(s): "PROCALCITON", "LATICACIDVEN" in the last 168 hours.  Recent Results (from the past 240  hour(s))  MRSA Next Gen by PCR, Nasal     Status: None   Collection Time: 09/03/22  4:36 PM   Specimen: Nasal Mucosa; Nasal Swab  Result Value Ref Range Status   MRSA by PCR Next Gen NOT DETECTED NOT DETECTED Final    Comment: (NOTE) The GeneXpert MRSA Assay (FDA approved for NASAL specimens only), is one component of a comprehensive MRSA colonization surveillance program. It is not intended to diagnose MRSA infection nor to guide or monitor treatment for  MRSA infections. Test performance is not FDA approved in patients less than 51 years old. Performed at [facility], [address] [place], [place] [address]     Radiology Studies: No results found.  Scheduled Meds:  atorvastatin  80 mg Oral Daily   feeding supplement (GLUCERNA SHAKE)  237 mL Oral TID BM   finasteride  5 mg Oral Daily   insulin aspart  0-5 Units Subcutaneous QHS   insulin aspart  0-9 Units Subcutaneous TID WC   insulin glargine-yfgn  10 Units Subcutaneous QHS   tamsulosin  0.8 mg Oral Daily   Continuous Infusions:  sodium chloride 75 mL/hr at 09/12/22 1146   pantoprazole 8 mg/hr (09/12/22 1154)    LOS: 0 days   Raiford Noble, DO [facility] Available via Epic secure chat 7am-7pm After these hours, please refer to coverage provider listed on [email or web address] 09/12/2022, 5:48 PM

## 2022-09-13 DIAGNOSIS — I2510 Atherosclerotic heart disease of native coronary artery without angina pectoris: Secondary | ICD-10-CM | POA: Diagnosis not present

## 2022-09-13 DIAGNOSIS — K254 Chronic or unspecified gastric ulcer with hemorrhage: Secondary | ICD-10-CM | POA: Diagnosis not present

## 2022-09-13 DIAGNOSIS — N400 Enlarged prostate without lower urinary tract symptoms: Secondary | ICD-10-CM | POA: Diagnosis not present

## 2022-09-13 DIAGNOSIS — K922 Gastrointestinal hemorrhage, unspecified: Secondary | ICD-10-CM | POA: Diagnosis not present

## 2022-09-13 LAB — COMPREHENSIVE METABOLIC PANEL
ALT: 12 U/L (ref 0–44)
AST: 29 U/L (ref 15–41)
Albumin: 2.1 g/dL — ABNORMAL LOW (ref 3.5–5.0)
Alkaline Phosphatase: 58 U/L (ref 38–126)
Anion gap: 4 — ABNORMAL LOW (ref 5–15)
BUN: 7 mg/dL — ABNORMAL LOW (ref 8–23)
CO2: 17 mmol/L — ABNORMAL LOW (ref 22–32)
Calcium: 7.3 mg/dL — ABNORMAL LOW (ref 8.9–10.3)
Chloride: 119 mmol/L — ABNORMAL HIGH (ref 98–111)
Creatinine, Ser: 1.35 mg/dL — ABNORMAL HIGH (ref 0.61–1.24)
GFR, Estimated: 52 mL/min — ABNORMAL LOW (ref >60)
Glucose, Bld: 108 mg/dL — ABNORMAL HIGH (ref 70–99)
Potassium: 4.1 mmol/L (ref 3.5–5.1)
Sodium: 140 mmol/L (ref 135–145)
Total Bilirubin: 0.8 mg/dL (ref 0.3–1.2)
Total Protein: 5.1 g/dL — ABNORMAL LOW (ref 6.5–8.1)

## 2022-09-13 LAB — CBC WITH DIFFERENTIAL/PLATELET
Abs Immature Granulocytes: 0.07 10*3/uL (ref 0.00–0.07)
Basophils Absolute: 0 10*3/uL (ref 0.0–0.1)
Basophils Relative: 1 %
Eosinophils Absolute: 0.5 10*3/uL (ref 0.0–0.5)
Eosinophils Relative: 9 %
HCT: 29.9 % — ABNORMAL LOW (ref 39.0–52.0)
Hemoglobin: 9.1 g/dL — ABNORMAL LOW (ref 13.0–17.0)
Immature Granulocytes: 1 %
Lymphocytes Relative: 18 %
Lymphs Abs: 0.9 10*3/uL (ref 0.7–4.0)
MCH: 28.4 pg (ref 26.0–34.0)
MCHC: 30.4 g/dL (ref 30.0–36.0)
MCV: 93.4 fL (ref 80.0–100.0)
Monocytes Absolute: 0.4 10*3/uL (ref 0.1–1.0)
Monocytes Relative: 9 %
Neutro Abs: 3.2 10*3/uL (ref 1.7–7.7)
Neutrophils Relative %: 62 %
Platelets: 327 10*3/uL (ref 150–400)
RBC: 3.2 MIL/uL — ABNORMAL LOW (ref 4.22–5.81)
RDW: 15.7 % — ABNORMAL HIGH (ref 11.5–15.5)
WBC: 5.1 10*3/uL (ref 4.0–10.5)
nRBC: 0 % (ref 0.0–0.2)

## 2022-09-13 LAB — GLUCOSE, CAPILLARY
Glucose-Capillary: 110 mg/dL — ABNORMAL HIGH (ref 70–99)
Glucose-Capillary: 133 mg/dL — ABNORMAL HIGH (ref 70–99)
Glucose-Capillary: 91 mg/dL (ref 70–99)
Glucose-Capillary: 120 mg/dL — ABNORMAL HIGH (ref 70–99)

## 2022-09-13 LAB — MAGNESIUM: Magnesium: 1.6 mg/dL — ABNORMAL LOW (ref 1.7–2.4)

## 2022-09-13 LAB — PHOSPHORUS: Phosphorus: 2.6 mg/dL (ref 2.5–4.6)

## 2022-09-13 MED ORDER — GLUCERNA SHAKE PO LIQD: 237.0000 mL | Freq: Three times a day (TID) | ORAL | 0 refills | Status: DC

## 2022-09-13 MED ORDER — MAGNESIUM SULFATE 2 GM/50ML IV SOLN
2.0000 g | Freq: Once | INTRAVENOUS | Status: DC
  Filled 2022-09-13: qty 50

## 2022-09-13 MED ORDER — SODIUM BICARBONATE 650 MG PO TABS
650.0000 mg | ORAL_TABLET | Freq: Two times a day (BID) | ORAL | Status: DC
  Administered 2022-09-13 – 2022-09-15 (×5): 650 mg via ORAL
  Filled 2022-09-13 (×5): qty 1

## 2022-09-13 MED ORDER — SODIUM BICARBONATE 650 MG PO TABS: 650.0000 mg | ORAL_TABLET | Freq: Two times a day (BID) | ORAL | 0 refills | Status: AC

## 2022-09-13 MED ORDER — LORAZEPAM 0.5 MG PO TABS: 0.5000 mg | ORAL_TABLET | Freq: Two times a day (BID) | ORAL | 0 refills | Status: DC | PRN

## 2022-09-13 MED ORDER — MAGNESIUM OXIDE -MG SUPPLEMENT 400 (240 MG) MG PO TABS
800.0000 mg | ORAL_TABLET | Freq: Once | ORAL | Status: AC
  Administered 2022-09-13: 800 mg via ORAL
  Filled 2022-09-13: qty 2

## 2022-09-13 NOTE — Progress Notes (Signed)
TOC CSM---EMS has been called.  Avalon Coppinger Tarpley-Carter, MSW, LCSW-A Pronouns:  She/Her/Hers [facility] HealthTransitions of Care Clinical Social Worker Direct Number:  [phone] [email or web address]

## 2022-09-13 NOTE — Progress Notes (Signed)
Attempted x 2 to call report to [place] no answer.

## 2022-09-13 NOTE — Discharge Summary (Signed)
Physician Discharge Summary   Patient: Randy [name] MRN: [id number] DOB: [birth year]  Admit date:     09/11/2022  Discharge date: 09/13/22  Discharge Physician: Raiford Noble, DO   PCP: Jilda Panda, MD   Recommendations at discharge:   Follow up with PCP within 1-2 weeks and repeat CBC, CMP, Mag, Phos within 1 week  Follow up with Gastroenterology within 1-2 weeks and follow CBC closely   Discharge Diagnoses: Principal Problem:   GI bleed Active Problems:   Chronic combined systolic and diastolic CHF (congestive heart failure) /EF 30 to 35 % and G3DD   Moderate Pulmonary HTN (HCC)   CAD, multiple vessel   Stage 3b chronic kidney disease ([place])   Primary hypertension   Mixed hyperlipidemia   GERD (gastroesophageal reflux disease)   BPH (benign prostatic hyperplasia)   Gastrointestinal hemorrhage  Resolved Problems:   * No resolved hospital problems. [facility] Course: HPI per Dr. Bernadette Hoit on 09/12/22 HPI: Randy [name] is a 81 y.o. male with medical history significant of hypertension, hyperlipidemia, chronic systolic heart failure, CAD, BPH and GERD who presents to the emergency department via EMS from a skilled nursing facility.  He was admitted on 9/13 due to acute metabolic encephalopathy and discharged today to a skilled nursing facility secondary to hyperosmolar nonketotic state due to type 2 DM and acute blood loss anemia in the setting of GI bleed in which large gastric ulcer was noted status post EGD-09/09/2022 with pending biopsies, he was discharged earlier today to a SNF with Protonix twice daily.  Unfortunately, patient was sent back from SNF after being noted to have a large bright red blood per rectum.   ED Course:  In the emergency department, temperature was 98.4, respiratory rate 17/min, pulse 59 bpm, BP 95/62, O2 sat 97%.  Work-up in the ED showed H/H 9.9/31.1 (this was 9.0/28.0 prior to discharge today), platelets 495, FOBT positive, sodium 134,  potassium 4.8, chloride 108, bicarb 20, glucose 251, BUN 18, creatinine 1.65.  Ammonia less than 10, FOBT positive.   **Interim History  Patient had been given Haldol and lorazepam when I saw him and he was resting and very somnolent and drowsy.  He subsequently woke up and became more agitated and had to be given some more Haldol.  GI was consulted for reevaluation given that he reportedly had a large bright red stool and he was ambulatory in the hallway in the ED without assistance for after arrival became agitated attempted to remove his IVs equipment.  Hemoglobin did drop to 8.6 this morning and recheck this afternoon was stable at 8.6.  From a GI standpoint they feel that he is stable and recommending continuing PPI twice daily and not to restart his Plavix until 09/17/2022 and avoiding all NSAIDs.  GI felt that he experienced a melanotic stool that was residual blood.  He is stable to go back to SNF since he is stable and not actively bleeding   Assessment and Plan:  Acute blood loss anemia due to GI bleed Gastrointestinal hemorrhage due to large gastric ulcer H/H= 9.9/31.1 (this was 9.0/28.0 prior to discharge yesterday-09/14/2022) Hemoccult was positive -Continue IV Protonix drip however can be changed to p.o. Protonix 40 mg twice daily and GI recommending holding Plavix until 9/27 -His hemoglobin/hematocrit is now trended down and went from 9.9/31.1 -> 8.6/27.5 -> 8.6/27.4 -> 9.1/29.9 and stable -Gastroenterologist was consulted evaluated the situation and they felt that the patient had some residual blood and did not feel that  he was actively bleeding -GI recommending no further intervention at this point they feel that he is stable from a GI standpoint recommending repeating CBC and feel that he experienced a residual melanotic stool given his prior bleeding large ulcer -We will repeat CBC in the morning and likely if stable can be discharged back to SNF   Hyponatremia -IVF now stopped and  Na+ went from 134 -> 137 -> 140 -Continue monitor and trend and repeat CMP in a.m.   Hypoalbuminemia possibly secondary to moderate protein calorie malnutrition -Albumin 2.3 and trended down to 2.1, protein supplements will be provided   T2DM with uncontrolled hyperglycemia -Hemoglobin A1c was 11.0 on 09/03/2022 -Continue Semglee 10 units nightly and adjust dose accordingly -CBGs ranging from 110-133 -Continue ISS and hypoglycemic protocol -Continue monitor CBGs per protocol -Glimepiride and metformin will be held at this time can be resumed at discharge   GERD -Continue Pantoprazole 40 mg p.o. twice daily   Dysphagia -Continue pured diet and thin liquids per speech pathologist recommendation with a dysphagia 1 diet   BPH -Continue Flomax and Proscar  Hypomagnesemia -Patient's Mag Level is now 1.6 -Replete with IV Mag Suflate 2 grams but IV was removed due to leaking so will give Mag Oxide 800 mg po x1 -Continue to Monitor and Trend and Repeat Mag Level within 1 week    Chronic combined systolic and diastolic CHF/moderate pulm hypertension -Echocardiogram done in December 2022 showed EF of 30 to 35% and G3 DD/pulmonary hypertension -Does not appear to be to be in volume overload; is positive at 1.730 L -Temporarily hold Coreg, BiDil due to soft BP but can resume -Temporarily hold Bumex due to soft BP but can resume   Mixed Hyperlipidemia -Continue Atorvastain 80 mg po Daily    Essential hypertension -Initially BP meds at this time due to soft BP but since his blood pressures are improved can resume medications at discharge    CAD -Temporarily hold Coreg, BiDil due to soft BP -Temporarily hold aspirin and Plavix due to GI bleed and GI recommends resuming Plavix [id number]   CKD 3a Metabolic acidosis -Stable and improving -Patient's BUN/creatinine went from 9/1.48 -> 10/1.65 -> 9/1.41 -> 7/1.35 -Patient CO2 is now 17, anion gap is 4, chloride level is 11 -Part sodium  bicarbonate tabs 650 mg p.o. twice daily for 3 days -Avoid further nephrotoxic medications, contrast dyes, hypotension and dehydration to ensure adequate renal perfusion and will need to renally dose medications -Repeat CMP within 1 week   Thrombocytosis -Patient's platelet count went from 422 -> 495 -> 465 -> 327 -Continue monitor and trend and repeat CBC within 1 week   Consultants: Gastroenterology  Procedures performed: None  Disposition: Long term care facility Diet recommendation:  Discharge Diet Orders (From admission, onward)     Start     Ordered   09/13/22 0000  Diet - low sodium heart healthy       Comments: Dysphagia 1 Diet   09/13/22 1204           Cardiac and Carb modified diet - DYSPHAGIA 1 DIET  DISCHARGE MEDICATION: Allergies as of 09/13/2022   No Known Allergies      Medication List     TAKE these medications    aspirin EC 81 MG tablet Take 1 tablet (81 mg total) by mouth daily with breakfast. Swallow whole. Start taking on: September 18, 2022   atorvastatin 80 MG tablet Commonly known as: LIPITOR Take 1 tablet (80 mg total)  by mouth daily.   bumetanide 1 MG tablet Commonly known as: BUMEX Take 1 tablet (1 mg total) by mouth daily.   carvedilol 3.125 MG tablet Commonly known as: COREG Take 2 tablets (6.25 mg total) by mouth 2 (two) times daily with a meal. Start taking on: September 22, 2022   clopidogrel 75 MG tablet Commonly known as: PLAVIX Take 1 tablet (75 mg total) by mouth daily. Start taking on: September 18, 2022   cyanocobalamin 500 MCG tablet Commonly known as: VITAMIN B12 Take 1 tablet (500 mcg total) by mouth daily.   divalproex 125 MG DR tablet Commonly known as: DEPAKOTE Take 375 mg by mouth 3 (three) times daily.   feeding supplement (GLUCERNA SHAKE) Liqd Take 237 mLs by mouth 3 (three) times daily between meals.   ferrous sulfate 325 (65 FE) MG tablet Take 1 tablet (325 mg total) by mouth 2 (two) times daily with a  meal.   finasteride 5 MG tablet Commonly known as: PROSCAR Take 1 tablet (5 mg total) by mouth daily.   glimepiride 2 MG tablet Commonly known as: Amaryl Take 1 tablet (2 mg total) by mouth daily with breakfast.   glucose 4 GM chewable tablet Chew 1 tablet (4 g total) by mouth as needed for low blood sugar.   isosorbide-hydrALAZINE 20-37.5 MG tablet Commonly known as: BIDIL Take 0.5 tablets by mouth 2 (two) times daily.   LORazepam 0.5 MG tablet Commonly known as: Ativan Take 1 tablet (0.5 mg total) by mouth every 12 (twelve) hours as needed for anxiety.   metFORMIN 500 MG tablet Commonly known as: Glucophage Take 1 tablet (500 mg total) by mouth 2 (two) times daily with a meal.   mirtazapine 15 MG tablet Commonly known as: REMERON Take 7.5 mg by mouth daily.   OLANZapine 7.5 MG tablet Commonly known as: ZYPREXA Take 7.5 mg by mouth daily.   pantoprazole 40 MG tablet Commonly known as: PROTONIX Take 1 tablet (40 mg total) by mouth 2 (two) times daily before a meal.   potassium chloride 10 MEQ tablet Commonly known as: KLOR-CON Take 1 tablet (10 mEq total) by mouth daily. Take While taking Bumex   sodium bicarbonate 650 MG tablet Take 1 tablet (650 mg total) by mouth 2 (two) times daily for 3 days.   tamsulosin 0.4 MG Caps capsule Commonly known as: FLOMAX Take 2 capsules (0.8 mg total) by mouth daily.        Discharge Exam: Filed Weights   09/11/22 2055  Weight: 55.6 kg   Vitals:   09/13/22 0000 09/13/22 0603  BP: (!) 109/52 (!) 126/56  Pulse: (!) 54 (!) 57  Resp: 18 18  Temp: 97.7 F (36.5 C) 97.8 F (36.6 C)  SpO2: 100% 100%   Examination: Physical Exam:  Constitutional: Thin male in NAD appears calm Respiratory: Diminished to auscultation bilaterally, no wheezing, rales, rhonchi or crackles. Normal respiratory effort and patient is not tachypenic. No accessory muscle use. Unlabored breathing  Cardiovascular: RRR, no murmurs / rubs / gallops.  S1 and S2 auscultated. No extremity edema.  Abdomen: Soft, non-tender, non-distended. Bowel sounds present  GU: Deferred. Musculoskeletal: No clubbing / cyanosis of digits/nails. No joint deformity upper and lower extremities.  Skin: No rashes, lesions, ulcers on a limited skin evaluation Neurologic: CN 2-12 grossly intact with no focal deficits. Romberg sign and cerebellar reflexes not assessed.  Psychiatric: Impaired judgement and insight.  Condition at discharge: stable  The results of significant diagnostics from this hospitalization (  including imaging, microbiology, ancillary and laboratory) are listed below for reference.   Imaging Studies: CT ANGIO GI BLEED  Result Date: 09/05/2022 CLINICAL DATA:  This impaction with rectal bleeding EXAM: CTA ABDOMEN AND PELVIS WITHOUT AND WITH CONTRAST TECHNIQUE: Multidetector CT imaging of the abdomen and pelvis was performed using the standard protocol during bolus administration of intravenous contrast. Multiplanar reconstructed images and MIPs were obtained and reviewed to evaluate the vascular anatomy. RADIATION DOSE REDUCTION: This exam was performed according to the departmental dose-optimization program which includes automated exposure control, adjustment of the mA and/or kV according to patient size and/or use of iterative reconstruction technique. CONTRAST:  18m OMNIPAQUE IOHEXOL 350 MG/ML SOLN COMPARISON:  None Available. FINDINGS: VASCULAR Aorta: No evidence abdominal aortic aneurysm or dissection. Atherosclerotic calcifications. Patent. Celiac: Patent.  Atherosclerotic calcifications at the origin. SMA: Patent.  Atherosclerotic calcifications at the origin. Renals: Patent. Atherosclerotic calcifications the origin on the right. IMA: Patent. Inflow: Patent bilaterally.  Atherosclerotic calcifications. Proximal Outflow: Patent bilaterally. Veins: Within normal limits. Review of the MIP images confirms the above findings. NON-VASCULAR Lower chest:  Lung bases are clear. Hepatobiliary: Subcentimeter cyst in segment 4A (series 15/image 10), benign. No follow-up is recommended. Scattered calcified granulomata. Gallbladder is unremarkable. No intrahepatic or extrahepatic ductal dilatation. Pancreas: Within normal limits. Spleen: Calcified splenic granulomata. Adrenals/Urinary Tract: Adrenal glands are within normal limits. Kidneys are within normal limits. No hydronephrosis. Thick-walled bladder, likely reflecting chronic bladder outlet obstruction. Stomach/Bowel: Stomach is within normal limits. No evidence of bowel obstruction. Normal appendix (series 15/image 47). No bowel wall thickening or inflammatory changes. Specifically, no rectal wall thickening or mass is evident on CT. No active extravasation of contrast following contrast administration to suggest active GI bleeding. Lymphatic: No suspicious abdominopelvic lymphadenopathy. Reproductive: Prostatomegaly, indenting the base of the bladder, reflecting BPH. Other: No abdominopelvic ascites. Musculoskeletal: Degenerative changes of the lumbar spine. Grade 1 spondylolisthesis at L5-S1. Mild superior endplate changes with Smalls node deformity at L4, likely chronic. IMPRESSION: No evidence of active GI bleeding. No rectal wall thickening or mass is evident on CT. BPH with chronic bladder outlet obstruction. Patent abdominal vasculature. Additional ancillary findings as above. Electronically Signed   By: SJulian HyM.D.   On: 09/05/2022 19:14   DG CHEST PORT 1 VIEW  Result Date: 09/05/2022 CLINICAL DATA:  Placement of central venous catheter EXAM: PORTABLE CHEST 1 VIEW COMPARISON:  Previous studies including the examination of 09/03/2022 FINDINGS: Transverse diameter of heart is increased. There are no signs of pulmonary edema or focal pulmonary consolidation. There is no pleural effusion or pneumothorax. There is interval placement of right IJ central venous catheter with its tip in superior vena  cava. IMPRESSION: Tip of right IJ central venous catheter is seen in superior vena cava. There is no pneumothorax. Electronically Signed   By: PElmer PickerM.D.   On: 09/05/2022 18:10   [place]KG SITE RITE  Result Date: 09/05/2022 If Site Rite image not attached, placement could not be confirmed due to current cardiac rhythm.  CT Head Wo Contrast  Result Date: 09/03/2022 CLINICAL DATA:  Altered mental status EXAM: CT HEAD WITHOUT CONTRAST TECHNIQUE: Contiguous axial images were obtained from the base of the skull through the vertex without intravenous contrast. RADIATION DOSE REDUCTION: This exam was performed according to the departmental dose-optimization program which includes automated exposure control, adjustment of the mA and/or kV according to patient size and/or use of iterative reconstruction technique. COMPARISON:  None Available. FINDINGS: Brain: No acute intracranial findings  are seen. There are no signs of bleeding within the cranium. Minimal calcifications are seen in basal ganglia. Cortical sulci are prominent. There is small area of encephalomalacia in the right frontal cortex. There is decreased density in periventricular and subcortical white matter. Vascular: Scattered arterial calcifications are seen. Skull: Unremarkable. Sinuses/Orbits: Unremarkable. Other: None. IMPRESSION: No acute intracranial findings are seen in noncontrast CT brain. Atrophy. 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 Electronically Signed   By: Elmer Picker M.D.   On: 09/03/2022 15:24   DG Chest Port 1 View  Result Date: 09/03/2022 CLINICAL DATA:  Altered mental status EXAM: PORTABLE CHEST 1 VIEW COMPARISON:  Radiograph 12/30/2021 FINDINGS: Unchanged cardiomediastinal silhouette. There is no focal airspace consolidation. There is no pleural effusion. No pneumothorax. There is no acute osseous abnormality. Unchanged mildly elevated right hemidiaphragm. IMPRESSION: No evidence of  acute cardiopulmonary disease. Unchanged mildly elevated right hemidiaphragm Electronically Signed   By: Maurine Simmering M.D.   On: 09/03/2022 13:50    Microbiology: Results for orders placed or performed during the hospital encounter of 09/03/22  MRSA Next Gen by PCR, Nasal     Status: None   Collection Time: 09/03/22  4:36 PM   Specimen: Nasal Mucosa; Nasal Swab  Result Value Ref Range Status   MRSA by PCR Next Gen NOT DETECTED NOT DETECTED Final    Comment: (NOTE) The GeneXpert MRSA Assay (FDA approved for NASAL specimens only), is one component of a comprehensive MRSA colonization surveillance program. It is not intended to diagnose MRSA infection nor to guide or monitor treatment for MRSA infections. Test performance is not FDA approved in patients less than 71 years old. Performed at [facility], [address] [place], [place] [address]    Labs: CBC: Recent Labs  Lab 09/10/22 0531 09/11/22 0659 09/11/22 2022 09/12/22 0522 09/12/22 1453 09/13/22 0543  WBC 5.7 5.0 6.1 5.2  --  5.1  NEUTROABS  --   --  3.9  --   --  3.2  HGB 9.3* 9.0* 9.9* 8.6* 8.6* 9.1*  HCT 28.5* 28.0* 31.1* 27.5* 27.4* 29.9*  MCV 88.8 89.2 89.4 90.8  --  93.4  PLT 387 422* 495* 465*  --  035   Basic Metabolic Panel: Recent Labs  Lab 09/10/22 0531 09/10/22 0846 09/11/22 0659 09/11/22 2022 09/12/22 0522 09/13/22 0543  NA 138  --  135 134* 137 140  K 3.4*  --  4.4 4.8 4.0 4.1  CL 112*  --  112* 108 114* 119*  CO2 20*  --  20* 20* 19* 17*  GLUCOSE 89  --  204* 251* 154* 108*  BUN 10  --  '9 10 9 '$ 7*  CREATININE 1.57*  --  1.48* 1.65* 1.41* 1.35*  CALCIUM 7.6*  --  7.3* 7.6* 7.1* 7.3*  MG  --  1.7  --   --   --  1.6*  PHOS  --   --   --   --   --  2.6   Liver Function Tests: Recent Labs  Lab 09/11/22 2022 09/12/22 0522 09/13/22 0543  AST '28 23 29  '$ ALT '17 15 12  '$ ALKPHOS 72 61 58  BILITOT 0.5 0.5 0.8  PROT 5.9* 5.3* 5.1*  ALBUMIN 2.3* 2.1* 2.1*   CBG: Recent Labs  Lab 09/12/22 1049  09/12/22 1852 09/12/22 2135 09/13/22 0718 09/13/22 1104  GLUCAP 128* 125* 118* 110* 133*   Discharge time spent: greater than 30 minutes.  Signed: Raiford Noble,  DO [facility] 09/13/2022

## 2022-09-13 NOTE — Progress Notes (Signed)
TOC CSW spoke with [facility].  Pt can be returned to LTC.  Call Report #:  [phone], [facility]  Room #:  893Y  This information was shared with nurse.  Miriam Kestler Tarpley-Carter, MSW, LCSW-A Pronouns:  She/Her/Hers [facility] HealthTransitions of Care Clinical Social Worker Direct Number:  [phone] [email or web address]

## 2022-09-13 NOTE — Progress Notes (Signed)
Called report to [facility] RN @ [facility]

## 2022-09-14 LAB — GLUCOSE, CAPILLARY
Glucose-Capillary: 107 mg/dL — ABNORMAL HIGH (ref 70–99)
Glucose-Capillary: 180 mg/dL — ABNORMAL HIGH (ref 70–99)
Glucose-Capillary: 65 mg/dL — ABNORMAL LOW (ref 70–99)
Glucose-Capillary: 77 mg/dL (ref 70–99)
Glucose-Capillary: 140 mg/dL — ABNORMAL HIGH (ref 70–99)
Glucose-Capillary: 144 mg/dL — ABNORMAL HIGH (ref 70–99)

## 2022-09-14 MED ORDER — INSULIN ASPART 100 UNIT/ML IJ SOLN: 0.0000 [IU] | Freq: Three times a day (TID) | INTRAMUSCULAR | Status: DC

## 2022-09-14 MED ORDER — INSULIN GLARGINE-YFGN 100 UNIT/ML ~~LOC~~ SOLN
7.0000 [IU] | Freq: Every day | SUBCUTANEOUS | Status: DC
  Filled 2022-09-14 (×2): qty 0.07

## 2022-09-14 NOTE — Discharge Summary (Signed)
Physician Discharge Summary   Patient: Randy [name] MRN: [id number] DOB: [birth year]  Admit date:     09/11/2022  Discharge date: 09/14/22  Discharge Physician: Raiford Noble, DO   PCP: Jilda Panda, MD   Recommendations at discharge:   Follow up with PCP within 1-2 weeks and repeat CBC, CMP, Mag, Phos within 1 week  Follow up with Gastroenterology within 1-2 weeks and follow CBC closely   Discharge Diagnoses: Principal Problem:   GI bleed Active Problems:   Chronic combined systolic and diastolic CHF (congestive heart failure) /EF 30 to 35 % and G3DD   Moderate Pulmonary HTN (HCC)   CAD, multiple vessel   Stage 3b chronic kidney disease ([place])   Primary hypertension   Mixed hyperlipidemia   GERD (gastroesophageal reflux disease)   BPH (benign prostatic hyperplasia)   Gastrointestinal hemorrhage  Resolved Problems:   * No resolved hospital problems. [facility] Course: HPI per Dr. Bernadette Hoit on 09/12/22 HPI: Randy [name] is a 81 y.o. male with medical history significant of hypertension, hyperlipidemia, chronic systolic heart failure, CAD, BPH and GERD who presents to the emergency department via EMS from a skilled nursing facility.  He was admitted on 9/13 due to acute metabolic encephalopathy and discharged today to a skilled nursing facility secondary to hyperosmolar nonketotic state due to type 2 DM and acute blood loss anemia in the setting of GI bleed in which large gastric ulcer was noted status post EGD-09/09/2022 with pending biopsies, he was discharged earlier today to a SNF with Protonix twice daily.  Unfortunately, patient was sent back from SNF after being noted to have a large bright red blood per rectum.   ED Course:  In the emergency department, temperature was 98.4, respiratory rate 17/min, pulse 59 bpm, BP 95/62, O2 sat 97%.  Work-up in the ED showed H/H 9.9/31.1 (this was 9.0/28.0 prior to discharge today), platelets 495, FOBT positive, sodium 134,  potassium 4.8, chloride 108, bicarb 20, glucose 251, BUN 18, creatinine 1.65.  Ammonia less than 10, FOBT positive.   **Interim History  Patient had been given Haldol and lorazepam when I saw him and he was resting and very somnolent and drowsy.  He subsequently woke up and became more agitated and had to be given some more Haldol.  GI was consulted for reevaluation given that he reportedly had a large bright red stool and he was ambulatory in the hallway in the ED without assistance for after arrival became agitated attempted to remove his IVs equipment.  Hemoglobin did drop to 8.6 this morning and recheck this afternoon was stable at 8.6.  From a GI standpoint they feel that he is stable and recommending continuing PPI twice daily and not to restart his Plavix until 09/17/2022 and avoiding all NSAIDs.  GI felt that he experienced a melanotic stool that was residual blood.  He is stable to go back to SNF since he is stable and not actively bleeding   ADDENDUM 09/14/22: Patient was deemed medically stable to D/C yesterday but did not leave due to transportation issues with EMS. Remains medically stable to D/C and no acute changes overnight. Feels well and denies complaints  Assessment and Plan:  Acute blood loss anemia due to GI bleed Gastrointestinal hemorrhage due to large gastric ulcer H/H= 9.9/31.1 (this was 9.0/28.0 prior to discharge yesterday-09/14/2022) Hemoccult was positive -Continue IV Protonix drip however can be changed to p.o. Protonix 40 mg twice daily and GI recommending holding Plavix until 9/27 -His hemoglobin/hematocrit is  now trended down and went from 9.9/31.1 -> 8.6/27.5 -> 8.6/27.4 -> 9.1/29.9 and stable -Gastroenterologist was consulted evaluated the situation and they felt that the patient had some residual blood and did not feel that he was actively bleeding -GI recommending no further intervention at this point they feel that he is stable from a GI standpoint recommending  repeating CBC and feel that he experienced a residual melanotic stool given his prior bleeding large ulcer -We will repeat CBC in the morning and likely if stable can be discharged back to SNF   Hyponatremia -IVF now stopped and Na+ went from 134 -> 137 -> 140 -Continue monitor and trend and repeat CMP in a.m.   Hypoalbuminemia possibly secondary to moderate protein calorie malnutrition -Albumin 2.3 and trended down to 2.1, protein supplements will be provided   T2DM with uncontrolled hyperglycemia -Hemoglobin A1c was 11.0 on 09/03/2022 -Continue Semglee 10 units nightly and adjust dose accordingly -CBGs ranging from 110-133 -Continue ISS and hypoglycemic protocol -Continue monitor CBGs per protocol -Glimepiride and metformin will be held at this time can be resumed at discharge   GERD -Continue Pantoprazole 40 mg p.o. twice daily   Dysphagia -Continue pured diet and thin liquids per speech pathologist recommendation with a dysphagia 1 diet   BPH -Continue Flomax and Proscar  Hypomagnesemia -Patient's Mag Level is now 1.6 -Replete with IV Mag Suflate 2 grams but IV was removed due to leaking so will give Mag Oxide 800 mg po x1 -Continue to Monitor and Trend and Repeat Mag Level within 1 week    Chronic combined systolic and diastolic CHF/moderate pulm hypertension -Echocardiogram done in December 2022 showed EF of 30 to 35% and G3 DD/pulmonary hypertension -Does not appear to be to be in volume overload; is positive at 1.730 L -Temporarily hold Coreg, BiDil due to soft BP but can resume -Temporarily hold Bumex due to soft BP but can resume   Mixed Hyperlipidemia -Continue Atorvastain 80 mg po Daily    Essential hypertension -Initially BP meds at this time due to soft BP but since his blood pressures are improved can resume medications at discharge    CAD -Temporarily hold Coreg, BiDil due to soft BP -Temporarily hold aspirin and Plavix due to GI bleed and GI recommends  resuming Plavix [id number]   CKD 3a Metabolic acidosis -Stable and improving -Patient's BUN/creatinine went from 9/1.48 -> 10/1.65 -> 9/1.41 -> 7/1.35 -Patient CO2 is now 17, anion gap is 4, chloride level is 11 -Part sodium bicarbonate tabs 650 mg p.o. twice daily for 3 days -Avoid further nephrotoxic medications, contrast dyes, hypotension and dehydration to ensure adequate renal perfusion and will need to renally dose medications -Repeat CMP within 1 week   Thrombocytosis -Patient's platelet count went from 422 -> 495 -> 465 -> 327 -Continue monitor and trend and repeat CBC within 1 week   Consultants: Gastroenterology  Procedures performed: None  Disposition: Long term care facility Diet recommendation:  Discharge Diet Orders (From admission, onward)     Start     Ordered   09/13/22 0000  Diet - low sodium heart healthy       Comments: Dysphagia 1 Diet   09/13/22 1204           Cardiac and Carb modified diet - DYSPHAGIA 1 DIET  DISCHARGE MEDICATION: Allergies as of 09/14/2022   No Known Allergies      Medication List     TAKE these medications    aspirin EC 81 MG  tablet Take 1 tablet (81 mg total) by mouth daily with breakfast. Swallow whole. Start taking on: September 18, 2022   atorvastatin 80 MG tablet Commonly known as: LIPITOR Take 1 tablet (80 mg total) by mouth daily.   bumetanide 1 MG tablet Commonly known as: BUMEX Take 1 tablet (1 mg total) by mouth daily.   carvedilol 3.125 MG tablet Commonly known as: COREG Take 2 tablets (6.25 mg total) by mouth 2 (two) times daily with a meal. Start taking on: September 22, 2022   clopidogrel 75 MG tablet Commonly known as: PLAVIX Take 1 tablet (75 mg total) by mouth daily. Start taking on: September 18, 2022   cyanocobalamin 500 MCG tablet Commonly known as: VITAMIN B12 Take 1 tablet (500 mcg total) by mouth daily.   divalproex 125 MG DR tablet Commonly known as: DEPAKOTE Take 375 mg by mouth 3  (three) times daily.   feeding supplement (GLUCERNA SHAKE) Liqd Take 237 mLs by mouth 3 (three) times daily between meals.   ferrous sulfate 325 (65 FE) MG tablet Take 1 tablet (325 mg total) by mouth 2 (two) times daily with a meal.   finasteride 5 MG tablet Commonly known as: PROSCAR Take 1 tablet (5 mg total) by mouth daily.   glimepiride 2 MG tablet Commonly known as: Amaryl Take 1 tablet (2 mg total) by mouth daily with breakfast.   glucose 4 GM chewable tablet Chew 1 tablet (4 g total) by mouth as needed for low blood sugar.   isosorbide-hydrALAZINE 20-37.5 MG tablet Commonly known as: BIDIL Take 0.5 tablets by mouth 2 (two) times daily.   LORazepam 0.5 MG tablet Commonly known as: Ativan Take 1 tablet (0.5 mg total) by mouth every 12 (twelve) hours as needed for anxiety.   metFORMIN 500 MG tablet Commonly known as: Glucophage Take 1 tablet (500 mg total) by mouth 2 (two) times daily with a meal.   mirtazapine 15 MG tablet Commonly known as: REMERON Take 7.5 mg by mouth daily.   OLANZapine 7.5 MG tablet Commonly known as: ZYPREXA Take 7.5 mg by mouth daily.   pantoprazole 40 MG tablet Commonly known as: PROTONIX Take 1 tablet (40 mg total) by mouth 2 (two) times daily before a meal.   potassium chloride 10 MEQ tablet Commonly known as: KLOR-CON Take 1 tablet (10 mEq total) by mouth daily. Take While taking Bumex   sodium bicarbonate 650 MG tablet Take 1 tablet (650 mg total) by mouth 2 (two) times daily for 3 days.   tamsulosin 0.4 MG Caps capsule Commonly known as: FLOMAX Take 2 capsules (0.8 mg total) by mouth daily.        Discharge Exam: Filed Weights   09/11/22 2055  Weight: 55.6 kg   Vitals:   09/14/22 0505 09/14/22 0757  BP: 129/70 (!) 126/53  Pulse: 65 61  Resp: 15 16  Temp: 98.1 F (36.7 C) 97.7 F (36.5 C)  SpO2: 97% 100%   Examination: Physical Exam:  Constitutional: Thin male in NAD appears calm Respiratory: Diminished to  auscultation bilaterally, no wheezing, rales, rhonchi or crackles. Normal respiratory effort and patient is not tachypenic. No accessory muscle use. Unlabored breathing  Cardiovascular: RRR, no murmurs / rubs / gallops. S1 and S2 auscultated. No extremity edema.  Abdomen: Soft, non-tender, non-distended. Bowel sounds present  GU: Deferred. Musculoskeletal: No clubbing / cyanosis of digits/nails. No joint deformity upper and lower extremities.  Skin: No rashes, lesions, ulcers on a limited skin evaluation Neurologic: CN 2-12  grossly intact with no focal deficits. Romberg sign and cerebellar reflexes not assessed.  Psychiatric: Impaired judgement and insight.  Condition at discharge: stable  The results of significant diagnostics from this hospitalization (including imaging, microbiology, ancillary and laboratory) are listed below for reference.   Imaging Studies: CT ANGIO GI BLEED  Result Date: 09/05/2022 CLINICAL DATA:  This impaction with rectal bleeding EXAM: CTA ABDOMEN AND PELVIS WITHOUT AND WITH CONTRAST TECHNIQUE: Multidetector CT imaging of the abdomen and pelvis was performed using the standard protocol during bolus administration of intravenous contrast. Multiplanar reconstructed images and MIPs were obtained and reviewed to evaluate the vascular anatomy. RADIATION DOSE REDUCTION: This exam was performed according to the departmental dose-optimization program which includes automated exposure control, adjustment of the mA and/or kV according to patient size and/or use of iterative reconstruction technique. CONTRAST:  15m OMNIPAQUE IOHEXOL 350 MG/ML SOLN COMPARISON:  None Available. FINDINGS: VASCULAR Aorta: No evidence abdominal aortic aneurysm or dissection. Atherosclerotic calcifications. Patent. Celiac: Patent.  Atherosclerotic calcifications at the origin. SMA: Patent.  Atherosclerotic calcifications at the origin. Renals: Patent. Atherosclerotic calcifications the origin on the right.  IMA: Patent. Inflow: Patent bilaterally.  Atherosclerotic calcifications. Proximal Outflow: Patent bilaterally. Veins: Within normal limits. Review of the MIP images confirms the above findings. NON-VASCULAR Lower chest: Lung bases are clear. Hepatobiliary: Subcentimeter cyst in segment 4A (series 15/image 10), benign. No follow-up is recommended. Scattered calcified granulomata. Gallbladder is unremarkable. No intrahepatic or extrahepatic ductal dilatation. Pancreas: Within normal limits. Spleen: Calcified splenic granulomata. Adrenals/Urinary Tract: Adrenal glands are within normal limits. Kidneys are within normal limits. No hydronephrosis. Thick-walled bladder, likely reflecting chronic bladder outlet obstruction. Stomach/Bowel: Stomach is within normal limits. No evidence of bowel obstruction. Normal appendix (series 15/image 47). No bowel wall thickening or inflammatory changes. Specifically, no rectal wall thickening or mass is evident on CT. No active extravasation of contrast following contrast administration to suggest active GI bleeding. Lymphatic: No suspicious abdominopelvic lymphadenopathy. Reproductive: Prostatomegaly, indenting the base of the bladder, reflecting BPH. Other: No abdominopelvic ascites. Musculoskeletal: Degenerative changes of the lumbar spine. Grade 1 spondylolisthesis at L5-S1. Mild superior endplate changes with Smalls node deformity at L4, likely chronic. IMPRESSION: No evidence of active GI bleeding. No rectal wall thickening or mass is evident on CT. BPH with chronic bladder outlet obstruction. Patent abdominal vasculature. Additional ancillary findings as above. Electronically Signed   By: SJulian HyM.D.   On: 09/05/2022 19:14   DG CHEST PORT 1 VIEW  Result Date: 09/05/2022 CLINICAL DATA:  Placement of central venous catheter EXAM: PORTABLE CHEST 1 VIEW COMPARISON:  Previous studies including the examination of 09/03/2022 FINDINGS: Transverse diameter of heart is  increased. There are no signs of pulmonary edema or focal pulmonary consolidation. There is no pleural effusion or pneumothorax. There is interval placement of right IJ central venous catheter with its tip in superior vena cava. IMPRESSION: Tip of right IJ central venous catheter is seen in superior vena cava. There is no pneumothorax. Electronically Signed   By: PElmer PickerM.D.   On: 09/05/2022 18:10   [place]KG SITE RITE  Result Date: 09/05/2022 If Site Rite image not attached, placement could not be confirmed due to current cardiac rhythm.  CT Head Wo Contrast  Result Date: 09/03/2022 CLINICAL DATA:  Altered mental status EXAM: CT HEAD WITHOUT CONTRAST TECHNIQUE: Contiguous axial images were obtained from the base of the skull through the vertex without intravenous contrast. RADIATION DOSE REDUCTION: This exam was performed according to the departmental  dose-optimization program which includes automated exposure control, adjustment of the mA and/or kV according to patient size and/or use of iterative reconstruction technique. COMPARISON:  None Available. FINDINGS: Brain: No acute intracranial findings are seen. There are no signs of bleeding within the cranium. Minimal calcifications are seen in basal ganglia. Cortical sulci are prominent. There is small area of encephalomalacia in the right frontal cortex. There is decreased density in periventricular and subcortical white matter. Vascular: Scattered arterial calcifications are seen. Skull: Unremarkable. Sinuses/Orbits: Unremarkable. Other: None. IMPRESSION: No acute intracranial findings are seen in noncontrast CT brain. Atrophy. 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 Electronically Signed   By: Elmer Picker M.D.   On: 09/03/2022 15:24   DG Chest Port 1 View  Result Date: 09/03/2022 CLINICAL DATA:  Altered mental status EXAM: PORTABLE CHEST 1 VIEW COMPARISON:  Radiograph 12/30/2021 FINDINGS: Unchanged  cardiomediastinal silhouette. There is no focal airspace consolidation. There is no pleural effusion. No pneumothorax. There is no acute osseous abnormality. Unchanged mildly elevated right hemidiaphragm. IMPRESSION: No evidence of acute cardiopulmonary disease. Unchanged mildly elevated right hemidiaphragm Electronically Signed   By: Maurine Simmering M.D.   On: 09/03/2022 13:50    Microbiology: Results for orders placed or performed during the hospital encounter of 09/03/22  MRSA Next Gen by PCR, Nasal     Status: None   Collection Time: 09/03/22  4:36 PM   Specimen: Nasal Mucosa; Nasal Swab  Result Value Ref Range Status   MRSA by PCR Next Gen NOT DETECTED NOT DETECTED Final    Comment: (NOTE) The GeneXpert MRSA Assay (FDA approved for NASAL specimens only), is one component of a comprehensive MRSA colonization surveillance program. It is not intended to diagnose MRSA infection nor to guide or monitor treatment for MRSA infections. Test performance is not FDA approved in patients less than 35 years old. Performed at [facility], [address] [place], [place] [address]    Labs: CBC: Recent Labs  Lab 09/10/22 0531 09/11/22 0659 09/11/22 2022 09/12/22 0522 09/12/22 1453 09/13/22 0543  WBC 5.7 5.0 6.1 5.2  --  5.1  NEUTROABS  --   --  3.9  --   --  3.2  HGB 9.3* 9.0* 9.9* 8.6* 8.6* 9.1*  HCT 28.5* 28.0* 31.1* 27.5* 27.4* 29.9*  MCV 88.8 89.2 89.4 90.8  --  93.4  PLT 387 422* 495* 465*  --  546    Basic Metabolic Panel: Recent Labs  Lab 09/10/22 0531 09/10/22 0846 09/11/22 0659 09/11/22 2022 09/12/22 0522 09/13/22 0543  NA 138  --  135 134* 137 140  K 3.4*  --  4.4 4.8 4.0 4.1  CL 112*  --  112* 108 114* 119*  CO2 20*  --  20* 20* 19* 17*  GLUCOSE 89  --  204* 251* 154* 108*  BUN 10  --  '9 10 9 '$ 7*  CREATININE 1.57*  --  1.48* 1.65* 1.41* 1.35*  CALCIUM 7.6*  --  7.3* 7.6* 7.1* 7.3*  MG  --  1.7  --   --   --  1.6*  PHOS  --   --   --   --   --  2.6    Liver  Function Tests: Recent Labs  Lab 09/11/22 2022 09/12/22 0522 09/13/22 0543  AST '28 23 29  '$ ALT '17 15 12  '$ ALKPHOS 72 61 58  BILITOT 0.5 0.5 0.8  PROT 5.9* 5.3* 5.1*  ALBUMIN 2.3* 2.1* 2.1*  CBG: Recent Labs  Lab 09/13/22 0718 09/13/22 1104 09/13/22 1630 09/13/22 2148 09/14/22 0844  GLUCAP 110* 133* 91 120* 107*    Discharge time spent: greater than 30 minutes.  Signed: Raiford Noble, DO [facility] 09/14/2022

## 2022-09-14 NOTE — Progress Notes (Signed)
TOC CSW with the following call report information to [place].  Elmo Putt Before 3pm, Janae After 3pm [phone]  Room #:  [id number]  This information has been shared with RN and EMS has been called.  Kenna Kirn Tarpley-Carter, MSW, LCSW-A Pronouns:  She/Her/Hers [facility] HealthTransitions of Care Clinical Social Worker Direct Number:  [phone] [email or web address]

## 2022-09-15 ENCOUNTER — Telehealth: Payer: Self-pay | Admitting: Gastroenterology

## 2022-09-15 DIAGNOSIS — K254 Chronic or unspecified gastric ulcer with hemorrhage: Secondary | ICD-10-CM | POA: Diagnosis not present

## 2022-09-15 DIAGNOSIS — I272 Pulmonary hypertension, unspecified: Secondary | ICD-10-CM | POA: Diagnosis not present

## 2022-09-15 DIAGNOSIS — I2510 Atherosclerotic heart disease of native coronary artery without angina pectoris: Secondary | ICD-10-CM | POA: Diagnosis not present

## 2022-09-15 DIAGNOSIS — I5042 Chronic combined systolic (congestive) and diastolic (congestive) heart failure: Secondary | ICD-10-CM | POA: Diagnosis not present

## 2022-09-15 LAB — GLUCOSE, CAPILLARY
Glucose-Capillary: 123 mg/dL — ABNORMAL HIGH (ref 70–99)
Glucose-Capillary: 137 mg/dL — ABNORMAL HIGH (ref 70–99)

## 2022-09-15 NOTE — Telephone Encounter (Signed)
Noted  

## 2022-09-15 NOTE — Discharge Summary (Signed)
Physician Discharge Summary   Patient: Randy [name] MRN: [id number] DOB: 09-28-1941  Admit date:     09/11/2022  Discharge date: 09/15/22  Discharge Physician: Raiford Noble, DO   PCP: Jilda Panda, MD   Recommendations at discharge:   Follow up with PCP within 1-2 weeks and repeat CBC, CMP, Mag, Phos within 1 week  Follow up with Gastroenterology within 1-2 weeks and follow CBC closely   Discharge Diagnoses: Principal Problem:   GI bleed Active Problems:   Chronic combined systolic and diastolic CHF (congestive heart failure) /EF 30 to 35 % and G3DD   Moderate Pulmonary HTN (HCC)   CAD, multiple vessel   Stage 3b chronic kidney disease ([place])   Primary hypertension   Mixed hyperlipidemia   GERD (gastroesophageal reflux disease)   BPH (benign prostatic hyperplasia)   Gastrointestinal hemorrhage  Resolved Problems:   * No resolved hospital problems. [facility] Course: HPI per Dr. Bernadette Hoit on 09/12/22 HPI: Randy [name] is a 81 y.o. male with medical history significant of hypertension, hyperlipidemia, chronic systolic heart failure, CAD, BPH and GERD who presents to the emergency department via EMS from a skilled nursing facility.  He was admitted on 9/13 due to acute metabolic encephalopathy and discharged today to a skilled nursing facility secondary to hyperosmolar nonketotic state due to type 2 DM and acute blood loss anemia in the setting of GI bleed in which large gastric ulcer was noted status post EGD-09/09/2022 with pending biopsies, he was discharged earlier today to a SNF with Protonix twice daily.  Unfortunately, patient was sent back from SNF after being noted to have a large bright red blood per rectum.   ED Course:  In the emergency department, temperature was 98.4, respiratory rate 17/min, pulse 59 bpm, BP 95/62, O2 sat 97%.  Work-up in the ED showed H/H 9.9/31.1 (this was 9.0/28.0 prior to discharge today), platelets 495, FOBT positive, sodium 134,  potassium 4.8, chloride 108, bicarb 20, glucose 251, BUN 18, creatinine 1.65.  Ammonia less than 10, FOBT positive.   **Interim History  Patient had been given Haldol and lorazepam when I saw him and he was resting and very somnolent and drowsy.  He subsequently woke up and became more agitated and had to be given some more Haldol.  GI was consulted for reevaluation given that he reportedly had a large bright red stool and he was ambulatory in the hallway in the ED without assistance for after arrival became agitated attempted to remove his IVs equipment.  Hemoglobin did drop to 8.6 this morning and recheck this afternoon was stable at 8.6.  From a GI standpoint they feel that he is stable and recommending continuing PPI twice daily and not to restart his Plavix until 09/17/2022 and avoiding all NSAIDs.  GI felt that he experienced a melanotic stool that was residual blood.  He is stable to go back to SNF since he is stable and not actively bleeding   ADDENDUM 09/14/22: Patient was deemed medically stable to D/C yesterday but did not leave due to transportation issues with EMS. Remains medically stable to D/C and no acute changes overnight. Feels well and denies complaints  ADDENDUM 09/15/22: Patient remains medically stable for D/C and still did not leave yesterday due to transportation issues with EMS. No issues overnight and yesterday Insulin regimen was reduced due to slight hypoglcycemia but now CBGs ranging from 65-144. Denies any complaints and EMS at bedside to transfer and he remains medically stable. Will need to  follow with PCP and GI within the coming weeks   Assessment and Plan:  Acute blood loss anemia due to GI bleed Gastrointestinal hemorrhage due to large gastric ulcer H/H= 9.9/31.1 (this was 9.0/28.0 prior to discharge yesterday-09/14/2022) Hemoccult was positive -Continue IV Protonix drip however can be changed to p.o. Protonix 40 mg twice daily and GI recommending holding Plavix until  9/27 -His hemoglobin/hematocrit is now trended down and went from 9.9/31.1 -> 8.6/27.5 -> 8.6/27.4 -> 9.1/29.9 and stable -Gastroenterologist was consulted evaluated the situation and they felt that the patient had some residual blood and did not feel that he was actively bleeding -GI recommending no further intervention at this point they feel that he is stable from a GI standpoint recommending repeating CBC and feel that he experienced a residual melanotic stool given his prior bleeding large ulcer -We will repeat CBC in the morning and likely if stable can be discharged back to SNF   Hyponatremia -IVF now stopped and Na+ went from 134 -> 137 -> 140 -Continue monitor and trend and repeat CMP in a.m.   Hypoalbuminemia possibly secondary to moderate protein calorie malnutrition -Albumin 2.3 and trended down to 2.1, protein supplements will be provided   T2DM with uncontrolled hyperglycemia -Hemoglobin A1c was 11.0 on 09/03/2022 -Continue Semglee 10 units nightly and adjust dose accordingly -CBGs ranging from 65-144 -Continue ISS and hypoglycemic protocol -Continue monitor CBGs per protocol -Glimepiride and metformin will be held at this time can be resumed at discharge   GERD -Continue Pantoprazole 40 mg p.o. twice daily   Dysphagia -Continue pured diet and thin liquids per speech pathologist recommendation with a dysphagia 1 diet   BPH -Continue Flomax and Proscar  Hypomagnesemia -Patient's Mag Level is now 1.6 -Replete with IV Mag Suflate 2 grams but IV was removed due to leaking so will give Mag Oxide 800 mg po x1 -Continue to Monitor and Trend and Repeat Mag Level within 1 week    Chronic combined systolic and diastolic CHF/moderate pulm hypertension -Echocardiogram done in December 2022 showed EF of 30 to 35% and G3 DD/pulmonary hypertension -Does not appear to be to be in volume overload; is positive at 1.730 L -Temporarily hold Coreg, BiDil due to soft BP but can  resume -Temporarily hold Bumex due to soft BP but can resume   Mixed Hyperlipidemia -Continue Atorvastain 80 mg po Daily    Essential hypertension -Initially BP meds at this time due to soft BP but since his blood pressures are improved can resume medications at discharge    CAD -Temporarily hold Coreg, BiDil due to soft BP -Temporarily hold aspirin and Plavix due to GI bleed and GI recommends resuming Plavix [id number]   CKD 3a Metabolic acidosis -Stable and improving -Patient's BUN/creatinine went from 9/1.48 -> 10/1.65 -> 9/1.41 -> 7/1.35 -Patient CO2 is now 17, anion gap is 4, chloride level is 11 -Part sodium bicarbonate tabs 650 mg p.o. twice daily for 3 days -Avoid further nephrotoxic medications, contrast dyes, hypotension and dehydration to ensure adequate renal perfusion and will need to renally dose medications -Repeat CMP within 1 week   Thrombocytosis -Patient's platelet count went from 422 -> 495 -> 465 -> 327 -Continue monitor and trend and repeat CBC within 1 week   Consultants: Gastroenterology  Procedures performed: None  Disposition: Long term care facility Diet recommendation:  Discharge Diet Orders (From admission, onward)     Start     Ordered   09/13/22 0000  Diet - low sodium heart  healthy       Comments: Dysphagia 1 Diet   09/13/22 1204           Cardiac and Carb modified diet - DYSPHAGIA 1 DIET  DISCHARGE MEDICATION: Allergies as of 09/15/2022   No Known Allergies      Medication List     TAKE these medications    aspirin EC 81 MG tablet Take 1 tablet (81 mg total) by mouth daily with breakfast. Swallow whole. Start taking on: September 18, 2022   atorvastatin 80 MG tablet Commonly known as: LIPITOR Take 1 tablet (80 mg total) by mouth daily.   bumetanide 1 MG tablet Commonly known as: BUMEX Take 1 tablet (1 mg total) by mouth daily.   carvedilol 3.125 MG tablet Commonly known as: COREG Take 2 tablets (6.25 mg total) by mouth  2 (two) times daily with a meal. Start taking on: September 22, 2022   clopidogrel 75 MG tablet Commonly known as: PLAVIX Take 1 tablet (75 mg total) by mouth daily. Start taking on: September 18, 2022   cyanocobalamin 500 MCG tablet Commonly known as: VITAMIN B12 Take 1 tablet (500 mcg total) by mouth daily.   divalproex 125 MG DR tablet Commonly known as: DEPAKOTE Take 375 mg by mouth 3 (three) times daily.   feeding supplement (GLUCERNA SHAKE) Liqd Take 237 mLs by mouth 3 (three) times daily between meals.   ferrous sulfate 325 (65 FE) MG tablet Take 1 tablet (325 mg total) by mouth 2 (two) times daily with a meal.   finasteride 5 MG tablet Commonly known as: PROSCAR Take 1 tablet (5 mg total) by mouth daily.   glimepiride 2 MG tablet Commonly known as: Amaryl Take 1 tablet (2 mg total) by mouth daily with breakfast.   glucose 4 GM chewable tablet Chew 1 tablet (4 g total) by mouth as needed for low blood sugar.   isosorbide-hydrALAZINE 20-37.5 MG tablet Commonly known as: BIDIL Take 0.5 tablets by mouth 2 (two) times daily.   LORazepam 0.5 MG tablet Commonly known as: Ativan Take 1 tablet (0.5 mg total) by mouth every 12 (twelve) hours as needed for anxiety.   metFORMIN 500 MG tablet Commonly known as: Glucophage Take 1 tablet (500 mg total) by mouth 2 (two) times daily with a meal.   mirtazapine 15 MG tablet Commonly known as: REMERON Take 7.5 mg by mouth daily.   OLANZapine 7.5 MG tablet Commonly known as: ZYPREXA Take 7.5 mg by mouth daily.   pantoprazole 40 MG tablet Commonly known as: PROTONIX Take 1 tablet (40 mg total) by mouth 2 (two) times daily before a meal.   potassium chloride 10 MEQ tablet Commonly known as: KLOR-CON Take 1 tablet (10 mEq total) by mouth daily. Take While taking Bumex   sodium bicarbonate 650 MG tablet Take 1 tablet (650 mg total) by mouth 2 (two) times daily for 3 days.   tamsulosin 0.4 MG Caps capsule Commonly known  as: FLOMAX Take 2 capsules (0.8 mg total) by mouth daily.       Discharge Exam: Filed Weights   09/11/22 2055  Weight: 55.6 kg   Vitals:   09/14/22 2033 09/15/22 0537  BP: (!) 131/54 132/62  Pulse: (!) 58 (!) 55  Resp: 16 15  Temp: 98.7 F (37.1 C) 98.4 F (36.9 C)  SpO2: 100% 98%   Examination: Physical Exam:  Constitutional: Thin male in NAD appears calm Respiratory: Diminished to auscultation bilaterally, no wheezing, rales, rhonchi or crackles. Normal  respiratory effort and patient is not tachypenic. No accessory muscle use. Unlabored breathing  Cardiovascular: RRR, no murmurs / rubs / gallops. S1 and S2 auscultated. No extremity edema.  Abdomen: Soft, non-tender, non-distended. Bowel sounds present  GU: Deferred. Musculoskeletal: No clubbing / cyanosis of digits/nails. No joint deformity upper and lower extremities.  Skin: No rashes, lesions, ulcers on a limited skin evaluation Neurologic: CN 2-12 grossly intact with no focal deficits. Romberg sign and cerebellar reflexes not assessed.  Psychiatric: Impaired judgement and insight.  Condition at discharge: stable  The results of significant diagnostics from this hospitalization (including imaging, microbiology, ancillary and laboratory) are listed below for reference.   Imaging Studies: CT ANGIO GI BLEED  Result Date: 09/05/2022 CLINICAL DATA:  This impaction with rectal bleeding EXAM: CTA ABDOMEN AND PELVIS WITHOUT AND WITH CONTRAST TECHNIQUE: Multidetector CT imaging of the abdomen and pelvis was performed using the standard protocol during bolus administration of intravenous contrast. Multiplanar reconstructed images and MIPs were obtained and reviewed to evaluate the vascular anatomy. RADIATION DOSE REDUCTION: This exam was performed according to the departmental dose-optimization program which includes automated exposure control, adjustment of the mA and/or kV according to patient size and/or use of iterative  reconstruction technique. CONTRAST:  66m OMNIPAQUE IOHEXOL 350 MG/ML SOLN COMPARISON:  None Available. FINDINGS: VASCULAR Aorta: No evidence abdominal aortic aneurysm or dissection. Atherosclerotic calcifications. Patent. Celiac: Patent.  Atherosclerotic calcifications at the origin. SMA: Patent.  Atherosclerotic calcifications at the origin. Renals: Patent. Atherosclerotic calcifications the origin on the right. IMA: Patent. Inflow: Patent bilaterally.  Atherosclerotic calcifications. Proximal Outflow: Patent bilaterally. Veins: Within normal limits. Review of the MIP images confirms the above findings. NON-VASCULAR Lower chest: Lung bases are clear. Hepatobiliary: Subcentimeter cyst in segment 4A (series 15/image 10), benign. No follow-up is recommended. Scattered calcified granulomata. Gallbladder is unremarkable. No intrahepatic or extrahepatic ductal dilatation. Pancreas: Within normal limits. Spleen: Calcified splenic granulomata. Adrenals/Urinary Tract: Adrenal glands are within normal limits. Kidneys are within normal limits. No hydronephrosis. Thick-walled bladder, likely reflecting chronic bladder outlet obstruction. Stomach/Bowel: Stomach is within normal limits. No evidence of bowel obstruction. Normal appendix (series 15/image 47). No bowel wall thickening or inflammatory changes. Specifically, no rectal wall thickening or mass is evident on CT. No active extravasation of contrast following contrast administration to suggest active GI bleeding. Lymphatic: No suspicious abdominopelvic lymphadenopathy. Reproductive: Prostatomegaly, indenting the base of the bladder, reflecting BPH. Other: No abdominopelvic ascites. Musculoskeletal: Degenerative changes of the lumbar spine. Grade 1 spondylolisthesis at L5-S1. Mild superior endplate changes with Smalls node deformity at L4, likely chronic. IMPRESSION: No evidence of active GI bleeding. No rectal wall thickening or mass is evident on CT. BPH with chronic  bladder outlet obstruction. Patent abdominal vasculature. Additional ancillary findings as above. Electronically Signed   By: SJulian HyM.D.   On: 09/05/2022 19:14   DG CHEST PORT 1 VIEW  Result Date: 09/05/2022 CLINICAL DATA:  Placement of central venous catheter EXAM: PORTABLE CHEST 1 VIEW COMPARISON:  Previous studies including the examination of 09/03/2022 FINDINGS: Transverse diameter of heart is increased. There are no signs of pulmonary edema or focal pulmonary consolidation. There is no pleural effusion or pneumothorax. There is interval placement of right IJ central venous catheter with its tip in superior vena cava. IMPRESSION: Tip of right IJ central venous catheter is seen in superior vena cava. There is no pneumothorax. Electronically Signed   By: PElmer PickerM.D.   On: 09/05/2022 18:10   [place]KG SITE RITE  Result Date: 09/05/2022 If Site Rite image not attached, placement could not be confirmed due to current cardiac rhythm.  CT Head Wo Contrast  Result Date: 09/03/2022 CLINICAL DATA:  Altered mental status EXAM: CT HEAD WITHOUT CONTRAST TECHNIQUE: Contiguous axial images were obtained from the base of the skull through the vertex without intravenous contrast. RADIATION DOSE REDUCTION: This exam was performed according to the departmental dose-optimization program which includes automated exposure control, adjustment of the mA and/or kV according to patient size and/or use of iterative reconstruction technique. COMPARISON:  None Available. FINDINGS: Brain: No acute intracranial findings are seen. There are no signs of bleeding within the cranium. Minimal calcifications are seen in basal ganglia. Cortical sulci are prominent. There is small area of encephalomalacia in the right frontal cortex. There is decreased density in periventricular and subcortical white matter. Vascular: Scattered arterial calcifications are seen. Skull: Unremarkable. Sinuses/Orbits: Unremarkable.  Other: None. IMPRESSION: No acute intracranial findings are seen in noncontrast CT brain. Atrophy. 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 Electronically Signed   By: Elmer Picker M.D.   On: 09/03/2022 15:24   DG Chest Port 1 View  Result Date: 09/03/2022 CLINICAL DATA:  Altered mental status EXAM: PORTABLE CHEST 1 VIEW COMPARISON:  Radiograph 12/30/2021 FINDINGS: Unchanged cardiomediastinal silhouette. There is no focal airspace consolidation. There is no pleural effusion. No pneumothorax. There is no acute osseous abnormality. Unchanged mildly elevated right hemidiaphragm. IMPRESSION: No evidence of acute cardiopulmonary disease. Unchanged mildly elevated right hemidiaphragm Electronically Signed   By: Maurine Simmering M.D.   On: 09/03/2022 13:50    Microbiology: Results for orders placed or performed during the hospital encounter of 09/03/22  MRSA Next Gen by PCR, Nasal     Status: None   Collection Time: 09/03/22  4:36 PM   Specimen: Nasal Mucosa; Nasal Swab  Result Value Ref Range Status   MRSA by PCR Next Gen NOT DETECTED NOT DETECTED Final    Comment: (NOTE) The GeneXpert MRSA Assay (FDA approved for NASAL specimens only), is one component of a comprehensive MRSA colonization surveillance program. It is not intended to diagnose MRSA infection nor to guide or monitor treatment for MRSA infections. Test performance is not FDA approved in patients less than 54 years old. Performed at [facility], [address] [place], [place] [address]    Labs: CBC: Recent Labs  Lab 09/10/22 0531 09/11/22 0659 09/11/22 2022 09/12/22 0522 09/12/22 1453 09/13/22 0543  WBC 5.7 5.0 6.1 5.2  --  5.1  NEUTROABS  --   --  3.9  --   --  3.2  HGB 9.3* 9.0* 9.9* 8.6* 8.6* 9.1*  HCT 28.5* 28.0* 31.1* 27.5* 27.4* 29.9*  MCV 88.8 89.2 89.4 90.8  --  93.4  PLT 387 422* 495* 465*  --  740   Basic Metabolic Panel: Recent Labs  Lab 09/10/22 0531 09/10/22 0846 09/11/22 0659  09/11/22 2022 09/12/22 0522 09/13/22 0543  NA 138  --  135 134* 137 140  K 3.4*  --  4.4 4.8 4.0 4.1  CL 112*  --  112* 108 114* 119*  CO2 20*  --  20* 20* 19* 17*  GLUCOSE 89  --  204* 251* 154* 108*  BUN 10  --  '9 10 9 '$ 7*  CREATININE 1.57*  --  1.48* 1.65* 1.41* 1.35*  CALCIUM 7.6*  --  7.3* 7.6* 7.1* 7.3*  MG  --  1.7  --   --   --  1.6*  PHOS  --   --   --   --   --  2.6   Liver Function Tests: Recent Labs  Lab 09/11/22 2022 09/12/22 0522 09/13/22 0543  AST '28 23 29  '$ ALT '17 15 12  '$ ALKPHOS 72 61 58  BILITOT 0.5 0.5 0.8  PROT 5.9* 5.3* 5.1*  ALBUMIN 2.3* 2.1* 2.1*   CBG: Recent Labs  Lab 09/14/22 1717 09/14/22 1846 09/14/22 2029 09/15/22 0008 09/15/22 0739  GLUCAP 77 140* 144* 137* 123*   Discharge time spent: greater than 30 minutes.  Signed: Raiford Noble, DO [facility] 09/15/2022

## 2022-09-15 NOTE — Progress Notes (Signed)
EMS arrived to transport [facility] to [facility] [place]. Report called to nurse at [facility] in [place]. Discharge paper work placed in packet for receiving facility.

## 2022-09-15 NOTE — Telephone Encounter (Signed)
Mandy/Susan:  Patient needs hospital follow-up in 2-4 weeks with Venetia Night, NP or Roseanne Kaufman, NP if available, or other available APP. She is being discharged to [facility] in [place].

## 2022-09-16 ENCOUNTER — Encounter (HOSPITAL_COMMUNITY): Payer: Self-pay | Admitting: Internal Medicine

## 2022-10-16 ENCOUNTER — Encounter: Payer: Self-pay | Admitting: Gastroenterology

## 2022-10-16 ENCOUNTER — Ambulatory Visit (INDEPENDENT_AMBULATORY_CARE_PROVIDER_SITE_OTHER): Payer: Medicare Other | Admitting: Gastroenterology

## 2022-10-16 VITALS — BP 118/69 | HR 75 | Temp 98.2°F | Ht 63.0 in | Wt 120.2 lb

## 2022-10-16 DIAGNOSIS — R1310 Dysphagia, unspecified: Secondary | ICD-10-CM

## 2022-10-16 DIAGNOSIS — D649 Anemia, unspecified: Secondary | ICD-10-CM

## 2022-10-16 DIAGNOSIS — K5900 Constipation, unspecified: Secondary | ICD-10-CM | POA: Diagnosis not present

## 2022-10-16 DIAGNOSIS — K279 Peptic ulcer, site unspecified, unspecified as acute or chronic, without hemorrhage or perforation: Secondary | ICD-10-CM

## 2022-10-16 MED ORDER — PANTOPRAZOLE SODIUM 40 MG PO TBEC: 40.0000 mg | DELAYED_RELEASE_TABLET | Freq: Two times a day (BID) | ORAL | 2 refills | Status: AC

## 2022-10-16 MED ORDER — DOCUSATE SODIUM 100 MG PO CAPS: 100.0000 mg | ORAL_CAPSULE | Freq: Every evening | ORAL | 0 refills | Status: AC

## 2022-10-16 NOTE — Progress Notes (Signed)
GI Office Note    Referring Provider: Jilda Panda, MD Primary Care Physician:  Jilda Panda, MD  Primary Gastroenterologist: Elon Alas. Abbey Chatters, DO   Chief Complaint   Chief Complaint  Patient presents with   Hospitalization Follow-up    Pt was admitted to hospital for GI bleed. He is here for a follow up.    History of Present Illness   Randy [name] is a 81 y.o. male presenting today at the request of Jilda Panda, MD for hospital follow up on anemia, melena, and peptic ulcer   Previously seen while inpatient for acute metabolic encephalopathy in the setting of hyperosmolar nonketotic state due to type 2 diabetes.  He was found to have a large stool burden and underwent disimpaction and then had a large melanotic bowel movement.  He became hypotensive during this time and went to the ICU.  His hemoglobin had dropped 5 points and was transfused with multiple units of blood.  CTA without any signs of bleeding.  He underwent EGD 09/09/22: Small hiatal hernia, gastritis, nonbleeding 15 mm cratered gastric ulcer biopsy negative for H. pylori and no evidence of malignancy.  His Plavix was held for 1 week.  He was advised to remain on PPI twice daily and avoid all NSAIDs.  Has bowel movements one to 2 times per week. Sometimes it is black and sometimes it isn't. Denies difficulty with going to the bathroom. No vomiting or nausea. Denies abdominal pain, lack appetite, or early satiety.  Physical therapy works with him multiple days per week with a walker. Has some fatigue at times. Has had some dysphagia.  Reports he is active as he can be at his SNF.  Weight appears to be stable.    Current Outpatient Medications  Medication Sig Dispense Refill   allopurinol (ZYLOPRIM) 100 MG tablet Take 100 mg by mouth daily.     aspirin EC 81 MG tablet Take 1 tablet (81 mg total) by mouth daily with breakfast. Swallow whole. 30 tablet 3   atorvastatin (LIPITOR) 80 MG tablet Take 1 tablet (80 mg  total) by mouth daily. 30 tablet 5   bumetanide (BUMEX) 1 MG tablet Take 1 tablet (1 mg total) by mouth daily. 30 tablet 3   carvedilol (COREG) 3.125 MG tablet Take 2 tablets (6.25 mg total) by mouth 2 (two) times daily with a meal. 60 tablet 0   clopidogrel (PLAVIX) 75 MG tablet Take 1 tablet (75 mg total) by mouth daily. 30 tablet 2   divalproex (DEPAKOTE) 125 MG DR tablet Take 375 mg by mouth 3 (three) times daily.     feeding supplement, GLUCERNA SHAKE, (GLUCERNA SHAKE) LIQD Take 237 mLs by mouth 3 (three) times daily between meals. 2370 mL 0   ferrous sulfate 325 (65 FE) MG tablet Take 1 tablet (325 mg total) by mouth 2 (two) times daily with a meal. 60 tablet 2   finasteride (PROSCAR) 5 MG tablet Take 1 tablet (5 mg total) by mouth daily. 30 tablet 3   glimepiride (AMARYL) 2 MG tablet Take 1 tablet (2 mg total) by mouth daily with breakfast. 30 tablet 2   glucose 4 GM chewable tablet Chew 1 tablet (4 g total) by mouth as needed for low blood sugar. 50 tablet 12   isosorbide-hydrALAZINE (BIDIL) 20-37.5 MG tablet Take 0.5 tablets by mouth 2 (two) times daily. 30 tablet 0   LORazepam (ATIVAN) 0.5 MG tablet Take 1 tablet (0.5 mg total) by mouth every 12 (twelve) hours  as needed for anxiety. 6 tablet 0   metFORMIN (GLUCOPHAGE) 500 MG tablet Take 1 tablet (500 mg total) by mouth 2 (two) times daily with a meal. 60 tablet 3   mirtazapine (REMERON) 15 MG tablet Take 7.5 mg by mouth daily.     OLANZapine (ZYPREXA) 7.5 MG tablet Take 7.5 mg by mouth daily.     potassium chloride (KLOR-CON) 10 MEQ tablet Take 1 tablet (10 mEq total) by mouth daily. Take While taking Bumex 30 tablet 2   tamsulosin (FLOMAX) 0.4 MG CAPS capsule Take 2 capsules (0.8 mg total) by mouth daily. 30 capsule 0   vitamin B-12 (CYANOCOBALAMIN) 500 MCG tablet Take 1 tablet (500 mcg total) by mouth daily. 30 tablet 0   divalproex (DEPAKOTE SPRINKLE) 125 MG capsule Take 375 mg by mouth 3 (three) times daily. (Patient not taking:  Reported on 10/16/2022)     pantoprazole (PROTONIX) 40 MG tablet Take 1 tablet (40 mg total) by mouth 2 (two) times daily before a meal. (Patient not taking: Reported on 10/16/2022) 60 tablet 0   No current facility-administered medications for this visit.    Past Medical History:  Diagnosis Date   Acute systolic CHF (congestive heart failure) ([place]) 11/16/2021   CAD, 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   Hearing loss    HTN (hypertension)     Past Surgical History:  Procedure Laterality Date   BIOPSY  09/09/2022   Procedure: BIOPSY;  Surgeon: Eloise Harman, DO;  [facility]: [facility];  Service: Endoscopy;;   ESOPHAGOGASTRODUODENOSCOPY (EGD) WITH PROPOFOL N/A 11/26/2021   Procedure: ESOPHAGOGASTRODUODENOSCOPY (EGD) WITH PROPOFOL;  Surgeon: Doran Stabler, MD;  Location: [place];  Service: Gastroenterology;  Laterality: N/A;   ESOPHAGOGASTRODUODENOSCOPY (EGD) WITH PROPOFOL N/A 09/09/2022   Procedure: ESOPHAGOGASTRODUODENOSCOPY (EGD) WITH PROPOFOL;  Surgeon: Eloise Harman, DO;  Location: [facility];  Service: Endoscopy;  Laterality: N/A;   EYE SURGERY     IABP INSERTION N/A 01/30/2021   Procedure: IABP Insertion;  Surgeon: Leonie Man, MD;  Location: [facility];  Service: Cardiovascular;  Laterality: N/A;   RIGHT/LEFT HEART CATH AND CORONARY ANGIOGRAPHY N/A 01/30/2021   Procedure: RIGHT/LEFT HEART CATH AND CORONARY ANGIOGRAPHY;  Surgeon: Leonie Man, MD;  Location: [facility];  Service: Cardiovascular;  Laterality: N/A;    No family history on file.  Allergies as of 10/16/2022   (No Known Allergies)    Social History   Socioeconomic History   Marital status: Widowed    Spouse name: Not on file   Number of children: Not on file   Years of education: Not on file   Highest education level: Not on file  Occupational History   Not on file  Tobacco Use   Smoking status: Never   Smokeless tobacco: Never  Vaping Use   Vaping Use: Never used   Substance and Sexual Activity   Alcohol use: Never   Drug use: Never   Sexual activity: Not on file  Other Topics Concern   Not on file  Social History Narrative   ** Merged History Encounter **       ** Merged History Encounter **       Social Determinants of Health   Financial Resource Strain: Not on file  Food Insecurity: No Food Insecurity (09/13/2022)   Hunger Vital Sign    Worried About Running Out of Food in the Last Year: Never true    [place] in the Last Year:  Never true  Transportation Needs: No Transportation Needs (09/13/2022)   PRAPARE - Hydrologist (Medical): No    Lack of Transportation (Non-Medical): No  Physical Activity: Not on file  Stress: Not on file  Social Connections: Not on file  Intimate Partner Violence: Not At Risk (09/13/2022)   Humiliation, Afraid, Rape, and Kick questionnaire    Fear of Current or Ex-Partner: No    Emotionally Abused: No    Physically Abused: No    Sexually Abused: No     Review of Systems   Gen: Denies any fever, chills, fatigue, weight loss, lack of appetite.  CV: Denies chest pain, heart palpitations, peripheral edema, syncope.  Resp: Denies shortness of breath at rest or with exertion. Denies wheezing or cough.  GI: see HPI GU : Denies urinary burning, urinary frequency, urinary hesitancy MS: Denies joint pain, muscle weakness, cramps, or limitation of movement.  Derm: Denies rash, itching, dry skin Psych: Denies depression, anxiety, memory loss, and confusion Heme: Denies bruising, bleeding, and enlarged lymph nodes.   Physical Exam   BP 118/69 (BP Location: Left Arm, Patient Position: Sitting, Cuff Size: Normal)   Pulse 75   Temp 98.2 F (36.8 C) (Temporal)   Ht '5\' 3"'$  (1.6 m)   Wt 120 lb 3.2 oz (54.5 kg)   SpO2 96%   BMI 21.29 kg/m   General:   Alert and oriented. Pleasant and cooperative. Head:  Normocephalic and atraumatic. Eyes:  Without icterus, sclera clear and  conjunctiva pink.  Ears:  Normal auditory acuity. Mouth:  Poor dentition Lungs:  Clear to auscultation bilaterally. No wheezes, rales, or rhonchi. No distress.  Heart:  S1, S2 present without murmurs appreciated.  Abdomen:  +BS, soft, non-tender and non-distended. No HSM noted. No guarding or rebound. No masses appreciated.  Rectal:  Deferred  Msk:  Symmetrical without gross deformities. Normal posture. Extremities:  Without edema. Neurologic:  Alert and oriented x4;  grossly normal neurologically. Skin:  Intact without significant lesions or rashes. Psych:  Alert and cooperative. Normal mood and affect.   Assessment   Randy [name] is a 81 y.o. male with a history of chronic systolic heart failure, CKD stage III, HTN, HLD, CAD, GERD, and BPH presenting today for hospital follow-up on anemia and peptic ulcer.  Peptic ulcer disease, dysphagia: Patient underwent EGD during hospitalization with 15 mm cratered gastric ulcer found on exam.  He was treated with PPI while inpatient but appears that he was not discharged to rehab on any PPI.  Advised to avoid all NSAIDs.  He is being treated for his anemia with twice daily oral iron as stated below.  Patient has poor dentition and did report some issues with swallowing therefore his diet has been modified to consist of softer foods.  We will continue to follow regarding dysphagia.  I recommended to his SNF the need to initiate PPI twice daily for 3 months and then decrease to once daily to adequately treat his ulcer.  He may require repeat EGD in 3-4 months to assess for healing.  Anemia: Hemoglobin during initial hospitalization was 12.9.  He had dropped to 7.7 and required multiple units of blood.  CTA was negative for any GI bleed.  EGD with large nonbleeding cratered ulcer.  Suspected that his drop in hemoglobin was likely due to upper GI bleed in the setting of his ulcer secondary to Plavix use.  His Plavix was briefly held for 7 days.   Hemoglobin on  discharge from the hospital was 9.1.  He was discharged on twice daily oral ferrous sulfate.  No labs available since discharge.  Recommended that nursing facility update patient CBC and faxes results to ensure his hemoglobin is stable or has improved back to baseline.  Constipation: Disimpacted during prior hospitalization resulting in large amount of stool and subsequent melanotic bleeding.  Currently reports 1-2 bowel movements per week but denies any need to strain.  History slightly difficult to obtain given language barrier.  Staff that accompanies him does not report any significant issues with his stools.  Constipation likely exacerbated by oral ferrous sulfate.  Would recommend daily stool softener to begin with, may eventually need miralax daily to avoid fecal impaction.  PLAN   Pantoprazole 40 mg twice daily for 3 months, then decrease to once daily. Continue ferrous sulfate 1 tablet twice daily Colace 100 mg nightly.  CBC Soft diet if concern for dysphagia Continue to monitor for any overt GI bleeding May possibly consider repeat EGD to check for esophageal ulcer Follow-up in 4 months.   Venetia Night, MSN, FNP-BC, AGACNP-BC [facility]

## 2022-10-16 NOTE — Patient Instructions (Addendum)
I want you to begin taking pantoprazole 40 mg twice daily for 3 months and then decrease to once daily.   Continue oral iron twice daily.  Continue heart healthy diet and soft diet for any swallowing issues.   I want to recheck your hemoglobin.  Please perform CBC within the next few days.  Please fax [place] the results.  Follow up in 4 months  It was a pleasure to see you today. I want to create trusting relationships with patients. If you receive a survey regarding your visit,  I greatly appreciate you taking time to fill this out on paper or through your MyChart. I value your feedback.  Venetia Night, MSN, FNP-BC, AGACNP-BC [facility]

## 2022-12-31 IMAGING — US US RENAL
1 series · 14 of 25 positions shown · non-contrast
Comparison: None.

CLINICAL DATA: Renal dysfunction

EXAM:
RENAL / URINARY TRACT ULTRASOUND COMPLETE

[Series 1: us renal · 14 of 95 slices shown]
[im 1/95]
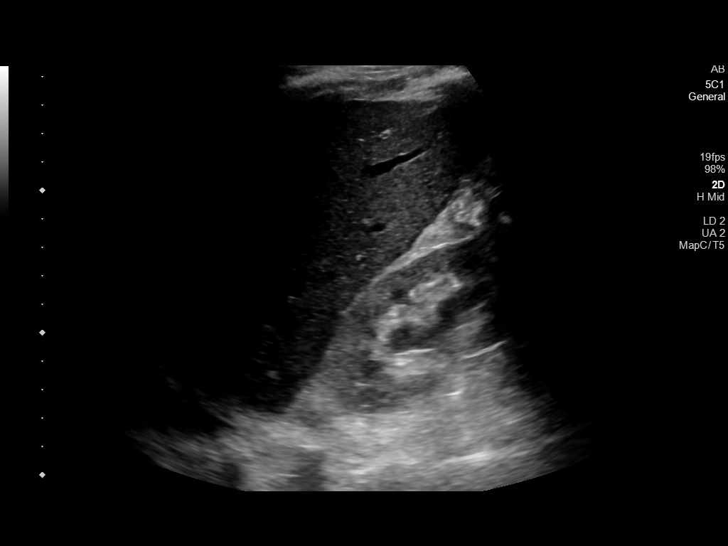
[im 8/95]
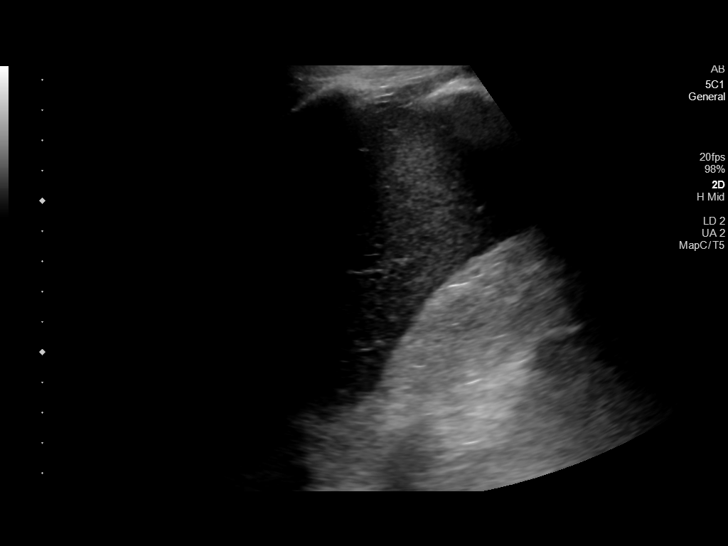
[im 16/95]
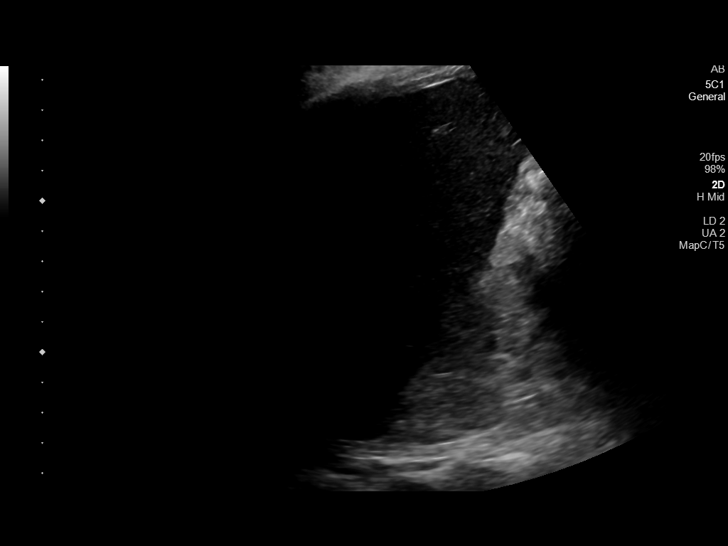
[im 24/95]
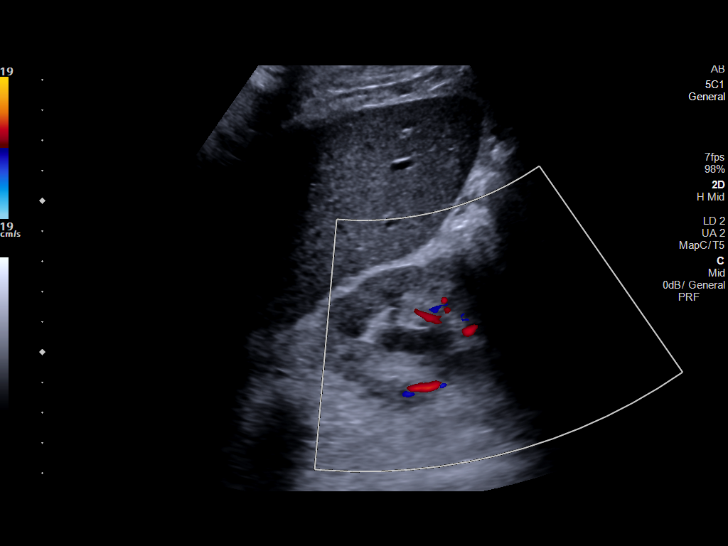
[im 32/95]
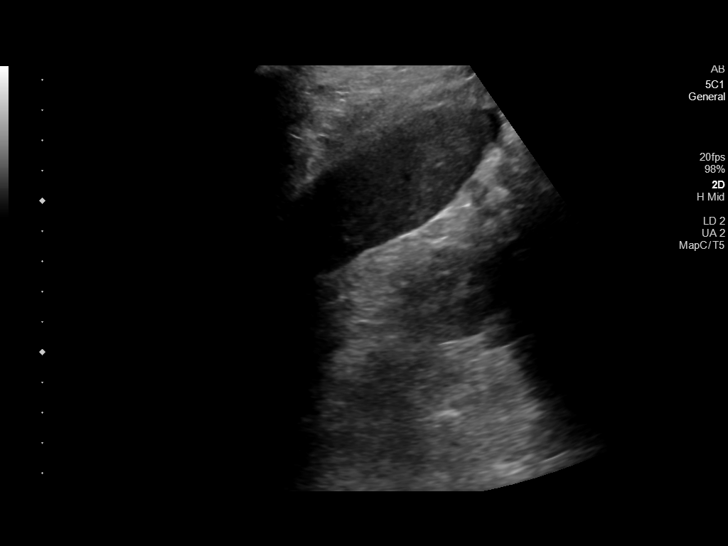
[im 36/95]
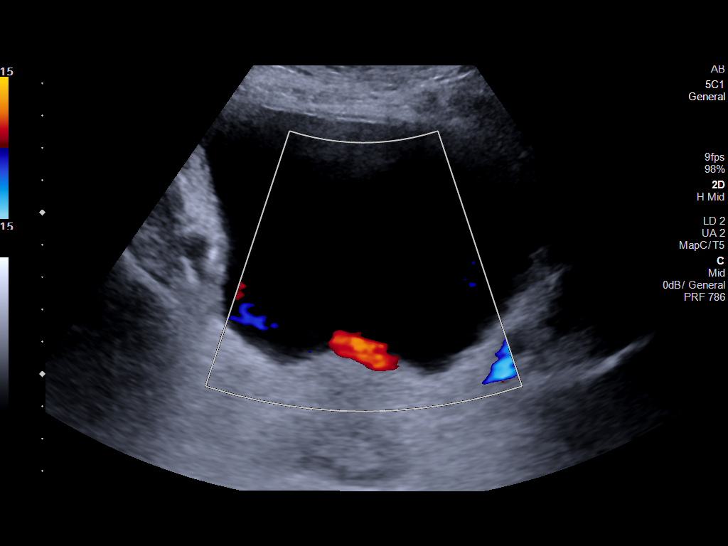
[im 44/95]
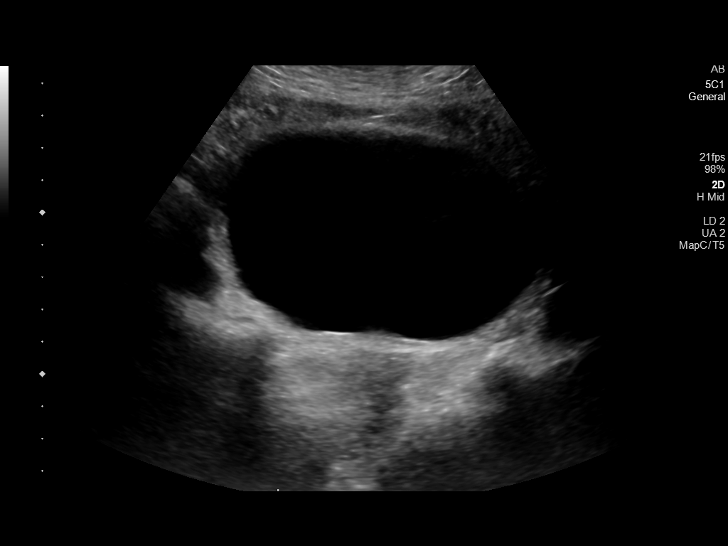
[im 51/95]
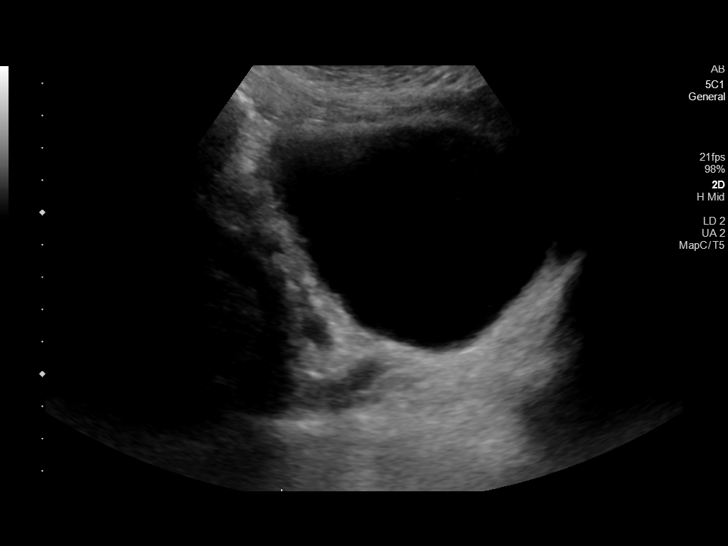
[im 59/95]
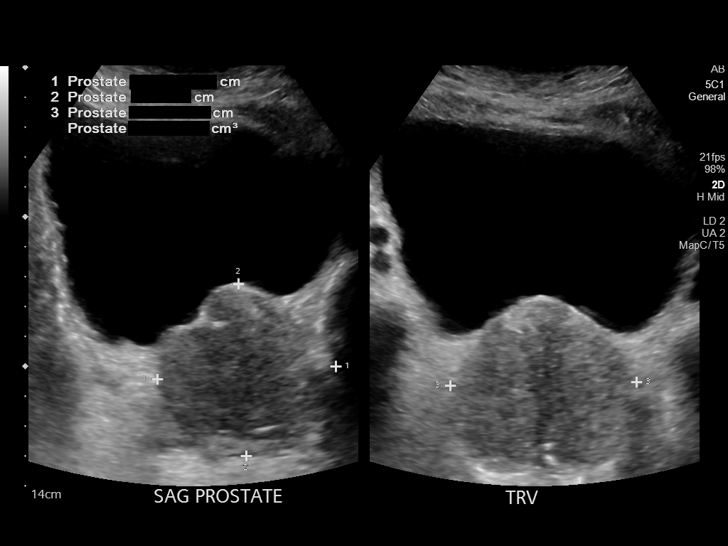
[im 63/95]
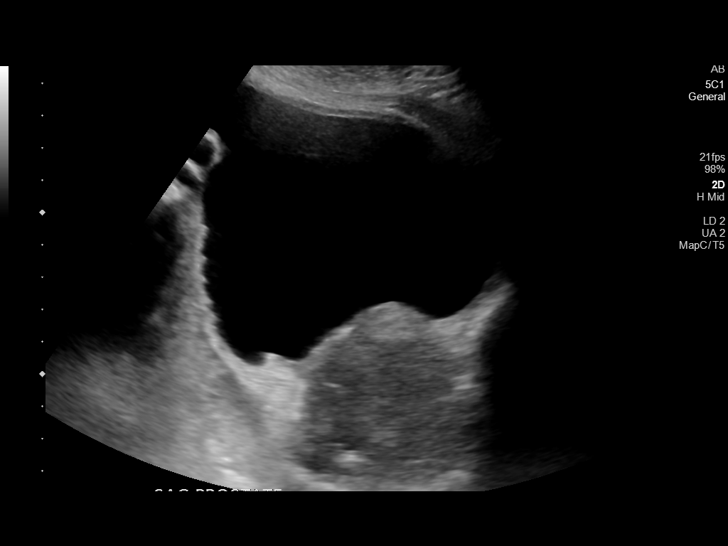
[im 71/95]
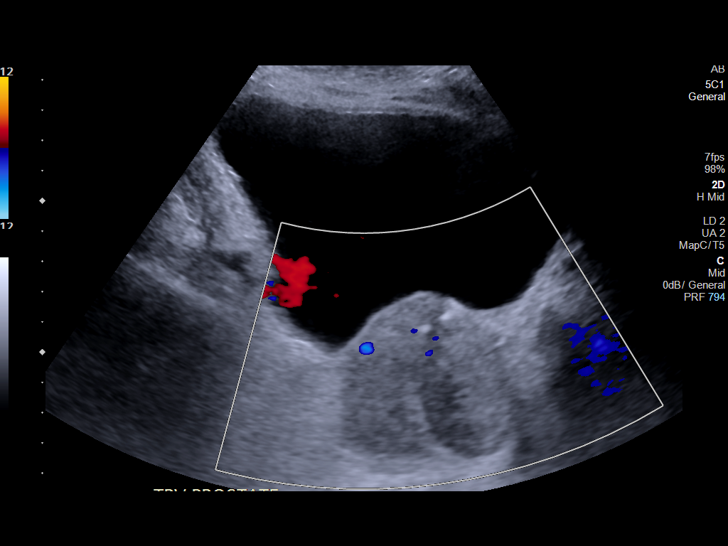
[im 79/95]
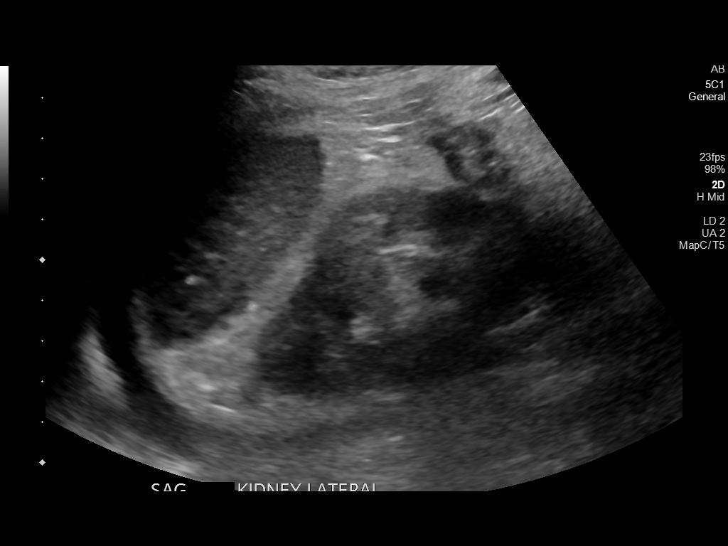
[im 87/95]
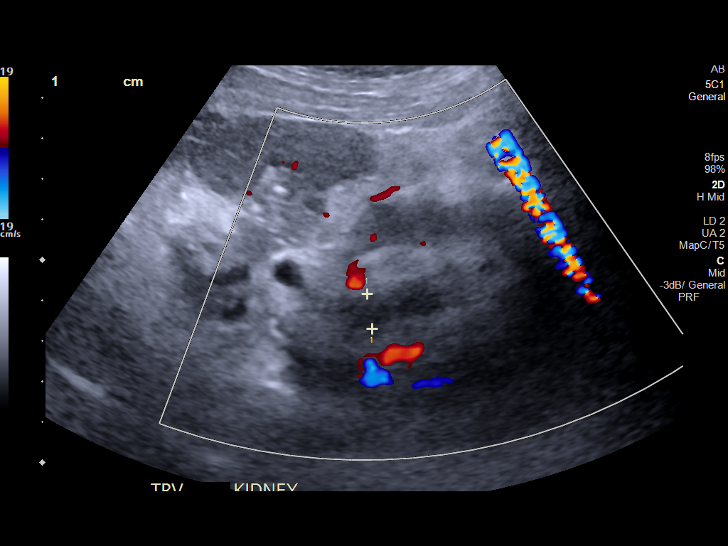
[im 95/95]
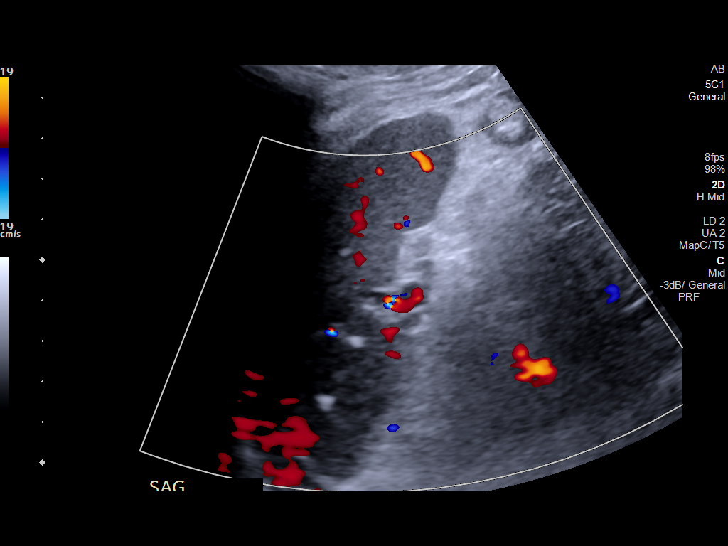

[14 of 25 positions shown; findings below may reference images not displayed]

FINDINGS: Right Kidney:

Renal measurements: 8.4 x 4.2 x 4.9 cm = volume: 89.6 mL. There is
increased cortical echogenicity. There is prominence of renal
pelvis. There is no perinephric fluid collection.

Left Kidney:

Renal measurements: 8 x 5 x 5 cm = volume: 104.4 mL. There is
increased cortical echogenicity. There is mild ectasia of renal
pelvis. There is no perinephric fluid collection.

Bladder:

Urinary bladder is unremarkable. Technologist observed ureteral jets
at both ureterovesical junctions. Prostate is enlarged projecting
into the base of the bladder. Prostate measures approximately 6 x
5.8 x 6.2 cm.

Other:

Incidental note is made of 6 mm hyperechoic focus in the spleen.
This may suggest small calcification or hemangioma.
IMPRESSION: There is increased cortical echogenicity suggesting medical renal
disease. There is mild pelviectasis in both kidneys without
significant dilation of minor calices. Enlarged prostate.

## 2022-12-31 IMAGING — DX DG CHEST 1V PORT
1 series · 1 of 1 positions shown · non-contrast
Comparison: 06/30/2021.

CLINICAL DATA: Shortness of breath.

EXAM:
PORTABLE CHEST 1 VIEW

[chest ap]
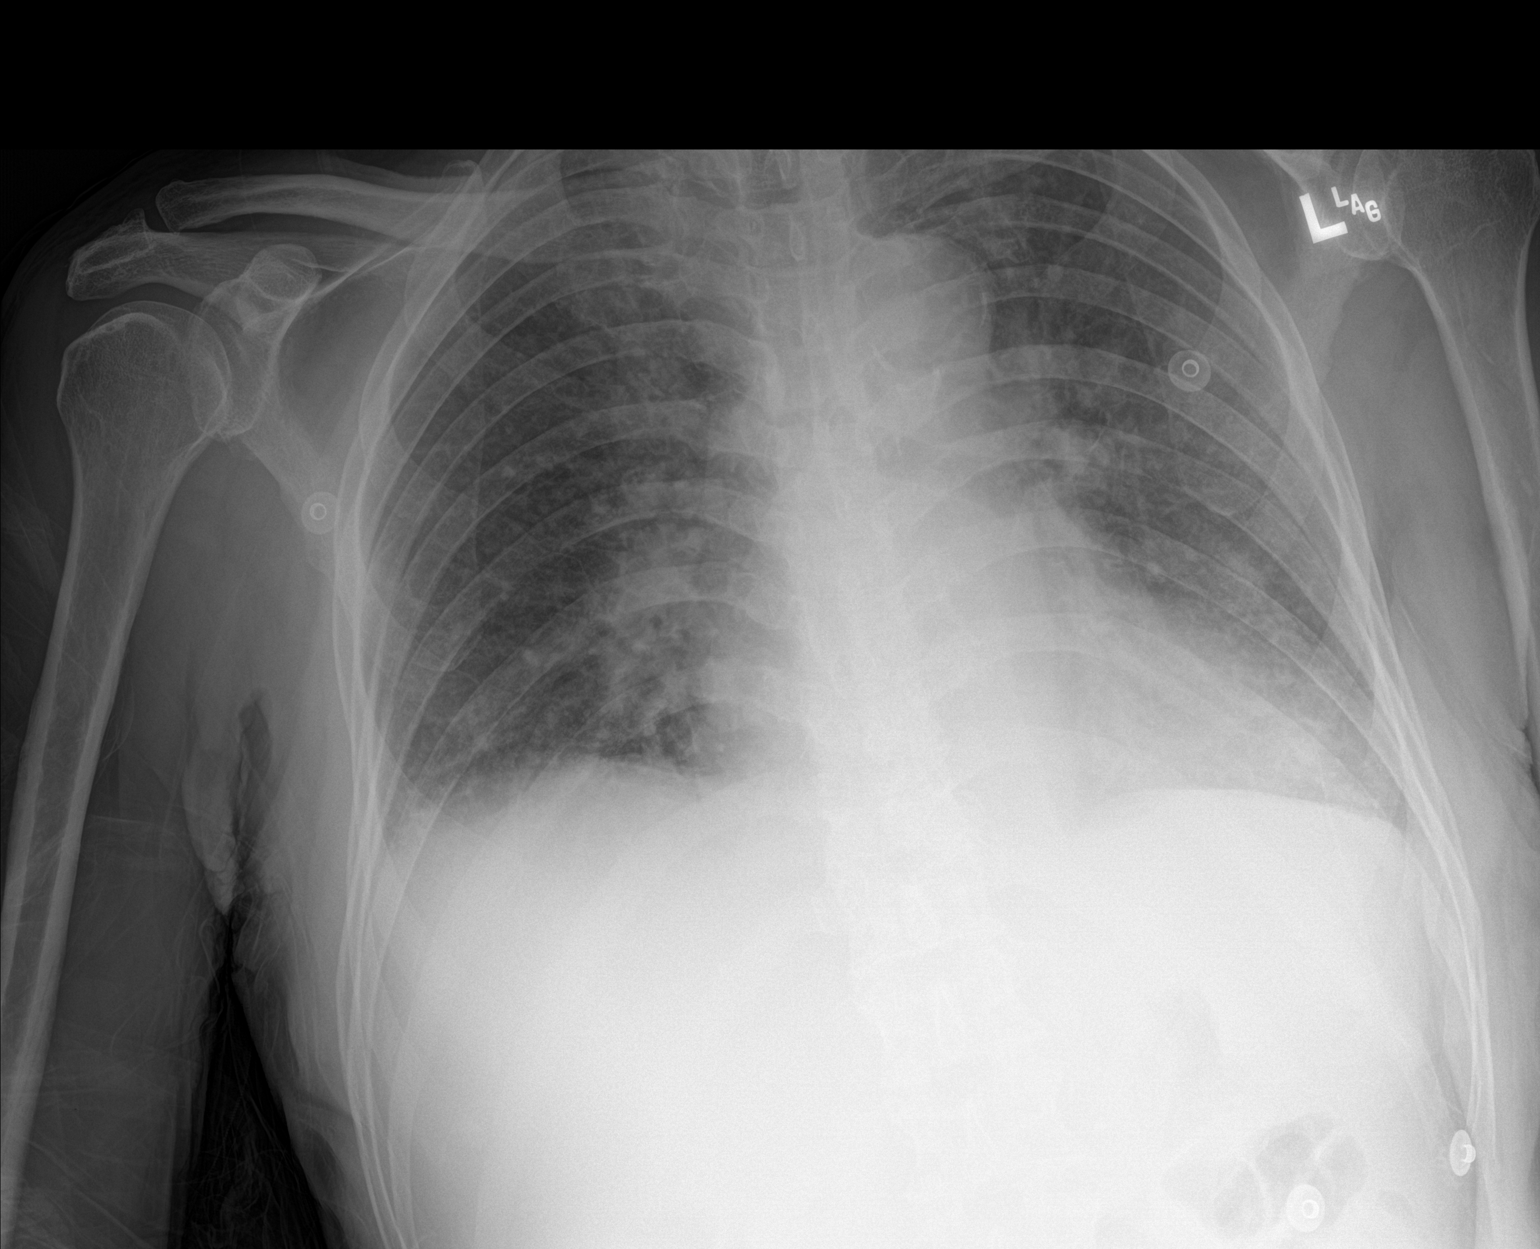

[1 of 1 positions shown; findings below may reference images not displayed]

FINDINGS: The heart is mildly enlarged. There is atherosclerotic calcification
of the aorta. The pulmonary vasculature is prominent. Lung volumes
are low and there is airspace disease at the lung bases, greater on
the left than on the right. No definite effusion or pneumothorax. No
acute osseous abnormality.
IMPRESSION: 1. Cardiomegaly with pulmonary vascular congestion.
2. Mild airspace disease at the lung bases, possible edema or
infiltrate.

## 2023-02-12 ENCOUNTER — Encounter: Payer: Self-pay | Admitting: Gastroenterology

## 2023-02-12 ENCOUNTER — Ambulatory Visit (INDEPENDENT_AMBULATORY_CARE_PROVIDER_SITE_OTHER): Payer: Medicare Other | Admitting: Gastroenterology

## 2023-02-12 VITALS — BP 97/57 | HR 64 | Temp 97.4°F | Wt 130.0 lb

## 2023-02-12 DIAGNOSIS — K5900 Constipation, unspecified: Secondary | ICD-10-CM | POA: Diagnosis not present

## 2023-02-12 DIAGNOSIS — K279 Peptic ulcer, site unspecified, unspecified as acute or chronic, without hemorrhage or perforation: Secondary | ICD-10-CM | POA: Diagnosis not present

## 2023-02-12 DIAGNOSIS — D649 Anemia, unspecified: Secondary | ICD-10-CM

## 2023-02-12 DIAGNOSIS — R1310 Dysphagia, unspecified: Secondary | ICD-10-CM

## 2023-02-12 NOTE — Progress Notes (Signed)
GI Office Note    Referring Provider: Jilda Panda, MD Primary Care Physician:  Jilda Panda, MD Primary Gastroenterologist: Elon Alas. Abbey Chatters, DO  Date:  02/12/2023  ID:  Randy [name], DOB 1941/10/31, MRN [id number]   Chief Complaint   Chief Complaint  Patient presents with   Follow-up    History of Present Illness  Randy [name] is a 82 y.o. male with a history of chronic systolic heart failure, CKD stage III, HTN, HLD, CAD, GERD, and BPH  presenting today for follow up.   Previously seen while inpatient for acute metabolic encephalopathy in the setting of hyperosmolar nonketotic state due to type 2 diabetes.  He was found to have a large stool burden and underwent disimpaction and then had a large melanotic bowel movement.  He became hypotensive during this time and went to the ICU.  His hemoglobin had dropped 5 points and was transfused with multiple units of blood.  CTA without any signs of bleeding.   He underwent EGD 09/09/22: Small hiatal hernia, gastritis, nonbleeding 15 mm cratered gastric ulcer biopsy negative for H. pylori and no evidence of malignancy.  His Plavix was held for 1 week.  He was advised to remain on PPI twice daily and avoid all NSAIDs.   Last office visit 10/16/22. Reported bowel movement 1-2 times per week, sometimes black sometimes not.  Denied difficulty.  Denied any nausea or vomiting, abdominal pain, leg appetite, early satiety.  [facility] physical therapy multiple days per week and walking with walker.  Noted some mild fatigue at times.  Denied any dysphagia.  Advised pantoprazole 40 mg twice daily for 3 months then decrease to once daily.  Advised to continue oral iron therapy twice daily.  Start Colace 100 mg nightly.  Advised soft diet concern for dysphagia.  Advised to check CBC and fax results.  Today:  Randy [name] interpreter [phone] (Happy) used to conduct visit.   Taking PPI once daily.   Denied abdominal pain, when he eats something sour  he has diarrheal cramping at times but overall controlled. Having normal stools every day. No blood in his stool. Only upper abdominal pain if he eats acidic fruit. Not able to eat fruit. No issues with sleep or fatigue. No nausea or vomiting. Weight stable. Good appetite. Denies chest pain, edema, shortness of breath.   Per interpreter patient at times is repeating the same answer to different questions and question has to be repeated to elicit an appropriate response.   Wt Readings from Last 3 Encounters:  02/12/23 130 lb (59 kg)  10/16/22 120 lb 3.2 oz (54.5 kg)  09/11/22 122 lb 9.2 oz (55.6 kg)   Current Outpatient Medications  Medication Sig Dispense Refill   allopurinol (ZYLOPRIM) 100 MG tablet Take 100 mg by mouth daily.     aspirin EC 81 MG tablet Take 1 tablet (81 mg total) by mouth daily with breakfast. Swallow whole. 30 tablet 3   atorvastatin (LIPITOR) 80 MG tablet Take 1 tablet (80 mg total) by mouth daily. 30 tablet 5   bumetanide (BUMEX) 1 MG tablet Take 1 tablet (1 mg total) by mouth daily. 30 tablet 3   carvedilol (COREG) 3.125 MG tablet Take 2 tablets (6.25 mg total) by mouth 2 (two) times daily with a meal. 60 tablet 0   clopidogrel (PLAVIX) 75 MG tablet Take 1 tablet (75 mg total) by mouth daily. 30 tablet 2   divalproex (DEPAKOTE SPRINKLE) 125 MG capsule Take 375 mg by mouth 3 (three) times  daily. (Patient not taking: Reported on 10/16/2022)     divalproex (DEPAKOTE) 125 MG DR tablet Take 375 mg by mouth 3 (three) times daily.     docusate sodium (COLACE) 100 MG capsule Take 1 capsule (100 mg total) by mouth at bedtime. 10 capsule 0   feeding supplement, GLUCERNA SHAKE, (GLUCERNA SHAKE) LIQD Take 237 mLs by mouth 3 (three) times daily between meals. 2370 mL 0   ferrous sulfate 325 (65 FE) MG tablet Take 1 tablet (325 mg total) by mouth 2 (two) times daily with a meal. 60 tablet 2   finasteride (PROSCAR) 5 MG tablet Take 1 tablet (5 mg total) by mouth daily. 30 tablet 3    glimepiride (AMARYL) 2 MG tablet Take 1 tablet (2 mg total) by mouth daily with breakfast. 30 tablet 2   glucose 4 GM chewable tablet Chew 1 tablet (4 g total) by mouth as needed for low blood sugar. 50 tablet 12   isosorbide-hydrALAZINE (BIDIL) 20-37.5 MG tablet Take 0.5 tablets by mouth 2 (two) times daily. 30 tablet 0   LORazepam (ATIVAN) 0.5 MG tablet Take 1 tablet (0.5 mg total) by mouth every 12 (twelve) hours as needed for anxiety. 6 tablet 0   metFORMIN (GLUCOPHAGE) 500 MG tablet Take 1 tablet (500 mg total) by mouth 2 (two) times daily with a meal. 60 tablet 3   mirtazapine (REMERON) 15 MG tablet Take 7.5 mg by mouth daily.     OLANZapine (ZYPREXA) 7.5 MG tablet Take 7.5 mg by mouth daily.     pantoprazole (PROTONIX) 40 MG tablet Take 1 tablet (40 mg total) by mouth 2 (two) times daily before a meal. 60 tablet 2   potassium chloride (KLOR-CON) 10 MEQ tablet Take 1 tablet (10 mEq total) by mouth daily. Take While taking Bumex 30 tablet 2   tamsulosin (FLOMAX) 0.4 MG CAPS capsule Take 2 capsules (0.8 mg total) by mouth daily. 30 capsule 0   vitamin B-12 (CYANOCOBALAMIN) 500 MCG tablet Take 1 tablet (500 mcg total) by mouth daily. 30 tablet 0   No current facility-administered medications for this visit.    Past Medical History:  Diagnosis Date   Acute systolic CHF (congestive heart failure) ([place]) 11/16/2021   CAD, 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   Hearing loss    HTN (hypertension)     Past Surgical History:  Procedure Laterality Date   BIOPSY  09/09/2022   Procedure: BIOPSY;  Surgeon: Eloise Harman, DO;  [facility];  Service: Endoscopy;;   ESOPHAGOGASTRODUODENOSCOPY (EGD) WITH PROPOFOL N/A 11/26/2021   Procedure: ESOPHAGOGASTRODUODENOSCOPY (EGD) WITH PROPOFOL;  Surgeon: Doran Stabler, MD;  Location: [facility];  Service: Gastroenterology;  Laterality: N/A;   ESOPHAGOGASTRODUODENOSCOPY (EGD) WITH PROPOFOL N/A 09/09/2022   Procedure: ESOPHAGOGASTRODUODENOSCOPY  (EGD) WITH PROPOFOL;  Surgeon: Eloise Harman, DO;  [facility];  Service: Endoscopy;  Laterality: N/A;   EYE SURGERY     IABP INSERTION N/A 01/30/2021   Procedure: IABP Insertion;  Surgeon: Leonie Man, MD;  Location: [facility];  Service: Cardiovascular;  Laterality: N/A;   RIGHT/LEFT HEART CATH AND CORONARY ANGIOGRAPHY N/A 01/30/2021   Procedure: RIGHT/LEFT HEART CATH AND CORONARY ANGIOGRAPHY;  Surgeon: Leonie Man, MD;  Location: [facility];  Service: Cardiovascular;  Laterality: N/A;    No family history on file.  Allergies as of 02/12/2023   (No Known Allergies)    Social History   Socioeconomic History   Marital status: Widowed  Spouse name: Not on file   Number of children: Not on file   Years of education: Not on file   Highest education level: Not on file  Occupational History   Not on file  Tobacco Use   Smoking status: Never   Smokeless tobacco: Never  Vaping Use   Vaping Use: Never used  Substance and Sexual Activity   Alcohol use: Never   Drug use: Never   Sexual activity: Not on file  Other Topics Concern   Not on file  Social History Narrative   ** Merged History Encounter **       ** Merged History Encounter **       Social Determinants of Health   Financial Resource Strain: Not on file  Food Insecurity: No Food Insecurity (09/13/2022)   Hunger Vital Sign    Worried About Running Out of Food in the Last Year: Never true    Ran Out of Food in the Last Year: Never true  Transportation Needs: No Transportation Needs (09/13/2022)   PRAPARE - Hydrologist (Medical): No    Lack of Transportation (Non-Medical): No  Physical Activity: Not on file  Stress: Not on file  Social Connections: Not on file     Review of Systems   Gen: Denies fever, chills, anorexia. Denies fatigue, weakness, weight loss.  CV: Denies chest pain, palpitations, syncope, peripheral edema, and  claudication. Resp: Denies dyspnea at rest, cough, wheezing, coughing up blood, and pleurisy. GI: See HPI Derm: Denies rash, itching, dry skin Psych: Denies depression, anxiety, memory loss, confusion. No homicidal or suicidal ideation.  Heme: Denies bruising, bleeding, and enlarged lymph nodes.   Physical Exam   BP (!) 97/57 (BP Location: Left Arm, Patient Position: Sitting, Cuff Size: Normal)   Pulse 64   Temp (!) 97.4 F (36.3 C) (Temporal)   Wt 130 lb (59 kg)   SpO2 95%   BMI 23.03 kg/m   General:   Alert and oriented. No distress noted. Pleasant and cooperative.  Head:  Normocephalic and atraumatic. Eyes:  Conjuctiva clear without scleral icterus. Mouth:  Oral mucosa pink and moist. Good dentition. No lesions. Lungs:  Clear to auscultation bilaterally. No wheezes, rales, or rhonchi. No distress.  Heart:  S1, S2 present without murmurs appreciated.  Abdomen:  +BS, soft, non-tender and non-distended. No rebound or guarding. No HSM or masses noted. Rectal: deferred Msk:  Symmetrical without gross deformities. Normal posture. Extremities:  Without edema. Neurologic:  Alert and  oriented x4 Psych:  Alert and cooperative. Normal mood and affect.   Assessment  Rayhan Knuteson is a 82 y.o. male with a history of chronic systolic heart failure, CKD stage III, HTN, HLD, CAD, GERD, and BPH  presenting today for follow up.  Constipation: Had constipation while inpatient and required disimpaction for which she had melanotic stool afterward.  At last visit he noted bowel movement 1-2/week.  Staff reported no significant issues with stools since hospitalization.  Recommended daily stool softener to help with constipation which he has been receiving.   Anemia: Hemoglobin dropped to 7.7 while inpatient in September 2023 requiring multiple units of blood.  EGD with large nonbleeding cratered gastric ulcer in which bleeding was likely secondary to Plavix use.  It was held for 7 days while  inpatient.  He was discharged on twice daily oral ferrous sulfate.  Previously advised for SNF to recheck CBC and fax results however they have not been received.  Denies melena,  brbpr, fatigue, chest pain, shortness of breath. Will advise again to repeat CBC today and fax [place] results. Will continue oral iron until Hgb results received. Doing well overall.   Peptic ulcer disease, dysphagia: EGD in September 2023 with 15 mm cratered gastric ulcer.  Was treated with PPI inpatient however not continued on discharge back to SNF, was restarted after office visit 10/16/2022.  Currently taking once daily per nursing reports/documentation. Advised to continue to avoid all NSAIDs. Denies dysphagia.  PLAN   Continue pantoprazole 40 mg once daily Continue daily stool softener Continue oral ferrous sulfate twice daily CBC Follow-up in 6 months    Venetia Night, MSN, FNP-BC, AGACNP-BC [facility]

## 2023-02-12 NOTE — Patient Instructions (Addendum)
Continue pantoprazole 40 mg once daily.  Continue daily stool softener and iron twice daily.  Please have CBC drawn this week and fax results to our office.  Plan to follow-up in 6 months.   It was a pleasure to see you today. I want to create trusting relationships with patients. If you receive a survey regarding your visit,  I greatly appreciate you taking time to fill this out on paper or through your MyChart. I value your feedback.  Venetia Night, MSN, FNP-BC, AGACNP-BC [facility]

## 2023-02-16 ENCOUNTER — Telehealth: Payer: Self-pay

## 2023-02-16 NOTE — Telephone Encounter (Signed)
Lab results from [facility] are scanned under media for review.

## 2023-02-17 NOTE — Telephone Encounter (Signed)
Spoke to nurse at [facility], informed nurse of results and recommendations. She voiced understanding.

## 2023-08-13 ENCOUNTER — Ambulatory Visit: Payer: Medicare Other | Admitting: Gastroenterology

## 2023-08-31 NOTE — Progress Notes (Unsigned)
Referring Provider: Ralene Ok, MD Primary Care Physician:  Ralene Ok, MD Primary GI Physician: Dr. Marletta Lor  No chief complaint on file.   HPI:   Randy [name] is a 82 y.o. male with a history of chronic systolic heart failure, CKD stage III, anemia, HTN, HLD, CAD, GERD, BPH, constipation requiring disimpaction September 2023, peptic ulcer disease with melena and acute blood loss anemia in September 2023, presenting today for follow-up.   Last seen in the office 02/12/23. He was doing well overall. Taking PPI once daily. No overt GI bleeding. Bowels moving fairly well with her a stool softener.  Some diarrheal cramping if eating something sour. Upper abdominal pain only if eating acidic fruit. Recommended continuing current medications and updating CBC.   Labs updated February 2024 with hemoglobin improved to 10.4.  Notably, his hemoglobin had declined as low as 6.3 and September 2023.  He does have chronic anemia with baseline hemoglobin previously in the 11 range.  Today:     ?surveillance EGD  Past Medical History:  Diagnosis Date   Acute systolic CHF (congestive heart failure) ([facility]) 11/16/2021   CAD, 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   Hearing loss    HTN (hypertension)     Past Surgical History:  Procedure Laterality Date   BIOPSY  09/09/2022   Procedure: BIOPSY;  Surgeon: Lanelle Bal, DO;  [facility];  Service: Endoscopy;;   ESOPHAGOGASTRODUODENOSCOPY (EGD) WITH PROPOFOL N/A 11/26/2021   Procedure: ESOPHAGOGASTRODUODENOSCOPY (EGD) WITH PROPOFOL;  Surgeon: Sherrilyn Rist, MD;  Location: [facility];  Service: Gastroenterology;  Laterality: N/A;   ESOPHAGOGASTRODUODENOSCOPY (EGD) WITH PROPOFOL N/A 09/09/2022   Procedure: ESOPHAGOGASTRODUODENOSCOPY (EGD) WITH PROPOFOL;  Surgeon: Lanelle Bal, DO;  [facility];  Service: Endoscopy;  Laterality: N/A;   EYE SURGERY     IABP INSERTION N/A 01/30/2021   Procedure: IABP Insertion;  Surgeon:  Marykay Lex, MD;  Location: [facility];  Service: Cardiovascular;  Laterality: N/A;   RIGHT/LEFT HEART CATH AND CORONARY ANGIOGRAPHY N/A 01/30/2021   Procedure: RIGHT/LEFT HEART CATH AND CORONARY ANGIOGRAPHY;  Surgeon: Marykay Lex, MD;  Location: [facility] INVASIVE CV LAB;  Service: Cardiovascular;  Laterality: N/A;    Current Outpatient Medications  Medication Sig Dispense Refill   allopurinol (ZYLOPRIM) 100 MG tablet Take 100 mg by mouth daily.     aspirin EC 81 MG tablet Take 1 tablet (81 mg total) by mouth daily with breakfast. Swallow whole. 30 tablet 3   atorvastatin (LIPITOR) 80 MG tablet Take 1 tablet (80 mg total) by mouth daily. 30 tablet 5   bumetanide (BUMEX) 1 MG tablet Take 1 tablet (1 mg total) by mouth daily. 30 tablet 3   carvedilol (COREG) 3.125 MG tablet Take 2 tablets (6.25 mg total) by mouth 2 (two) times daily with a meal. 60 tablet 0   clopidogrel (PLAVIX) 75 MG tablet Take 1 tablet (75 mg total) by mouth daily. 30 tablet 2   divalproex (DEPAKOTE SPRINKLE) 125 MG capsule Take 375 mg by mouth 3 (three) times daily. (Patient not taking: Reported on 10/16/2022)     divalproex (DEPAKOTE) 125 MG DR tablet Take 375 mg by mouth 3 (three) times daily.     docusate sodium (COLACE) 100 MG capsule Take 1 capsule (100 mg total) by mouth at bedtime. 10 capsule 0   feeding supplement, GLUCERNA SHAKE, (GLUCERNA SHAKE) LIQD Take 237 mLs by mouth 3 (three) times daily between meals. 2370 mL 0   ferrous  sulfate 325 (65 FE) MG tablet Take 1 tablet (325 mg total) by mouth 2 (two) times daily with a meal. 60 tablet 2   finasteride (PROSCAR) 5 MG tablet Take 1 tablet (5 mg total) by mouth daily. 30 tablet 3   glimepiride (AMARYL) 2 MG tablet Take 1 tablet (2 mg total) by mouth daily with breakfast. 30 tablet 2   glucose 4 GM chewable tablet Chew 1 tablet (4 g total) by mouth as needed for low blood sugar. 50 tablet 12   isosorbide-hydrALAZINE (BIDIL) 20-37.5 MG tablet Take 0.5 tablets  by mouth 2 (two) times daily. 30 tablet 0   LORazepam (ATIVAN) 0.5 MG tablet Take 1 tablet (0.5 mg total) by mouth every 12 (twelve) hours as needed for anxiety. 6 tablet 0   metFORMIN (GLUCOPHAGE) 500 MG tablet Take 1 tablet (500 mg total) by mouth 2 (two) times daily with a meal. 60 tablet 3   mirtazapine (REMERON) 15 MG tablet Take 7.5 mg by mouth daily.     OLANZapine (ZYPREXA) 7.5 MG tablet Take 7.5 mg by mouth daily.     pantoprazole (PROTONIX) 40 MG tablet Take 1 tablet (40 mg total) by mouth 2 (two) times daily before a meal. 60 tablet 2   potassium chloride (KLOR-CON) 10 MEQ tablet Take 1 tablet (10 mEq total) by mouth daily. Take While taking Bumex 30 tablet 2   tamsulosin (FLOMAX) 0.4 MG CAPS capsule Take 2 capsules (0.8 mg total) by mouth daily. 30 capsule 0   vitamin B-12 (CYANOCOBALAMIN) 500 MCG tablet Take 1 tablet (500 mcg total) by mouth daily. 30 tablet 0   No current facility-administered medications for this visit.    Allergies as of 09/02/2023   (No Known Allergies)    No family history on file.  Social History   Socioeconomic History   Marital status: Widowed    Spouse name: Not on file   Number of children: Not on file   Years of education: Not on file   Highest education level: Not on file  Occupational History   Not on file  Tobacco Use   Smoking status: Never   Smokeless tobacco: Never  Vaping Use   Vaping status: Never Used  Substance and Sexual Activity   Alcohol use: Never   Drug use: Never   Sexual activity: Not on file  Other Topics Concern   Not on file  Social History Narrative   ** Merged History Encounter **       ** Merged History Encounter **       Social Determinants of Health   Financial Resource Strain: Not on file  Food Insecurity: No Food Insecurity (09/13/2022)   Hunger Vital Sign    Worried About Running Out of Food in the Last Year: Never true    Ran Out of Food in the Last Year: Never true  Transportation Needs: No  Transportation Needs (09/13/2022)   PRAPARE - Administrator, Civil Service (Medical): No    Lack of Transportation (Non-Medical): No  Physical Activity: Not on file  Stress: Not on file  Social Connections: Not on file    Review of Systems: Gen: Denies fever, chills, anorexia. Denies fatigue, weakness, weight loss.  CV: Denies chest pain, palpitations, syncope, peripheral edema, and claudication. Resp: Denies dyspnea at rest, cough, wheezing, coughing up blood, and pleurisy. GI: Denies vomiting blood, jaundice, and fecal incontinence.   Denies dysphagia or odynophagia. Derm: Denies rash, itching, dry skin Psych: Denies depression,  anxiety, memory loss, confusion. No homicidal or suicidal ideation.  Heme: Denies bruising, bleeding, and enlarged lymph nodes.  Physical Exam: There were no vitals taken for this visit. General:   Alert and oriented. No distress noted. Pleasant and cooperative.  Head:  Normocephalic and atraumatic. Eyes:  Conjuctiva clear without scleral icterus. Heart:  S1, S2 present without murmurs appreciated. Lungs:  Clear to auscultation bilaterally. No wheezes, rales, or rhonchi. No distress.  Abdomen:  +BS, soft, non-tender and non-distended. No rebound or guarding. No HSM or masses noted. Msk:  Symmetrical without gross deformities. Normal posture. Extremities:  Without edema. Neurologic:  Alert and  oriented x4 Psych:  Normal mood and affect.    Assessment:     Plan:  ***   Ermalinda Memos, PA-C [facility] 09/02/2023

## 2023-09-02 ENCOUNTER — Telehealth: Payer: Self-pay | Admitting: Gastroenterology

## 2023-09-02 ENCOUNTER — Ambulatory Visit (INDEPENDENT_AMBULATORY_CARE_PROVIDER_SITE_OTHER): Payer: Medicare Other | Admitting: Gastroenterology

## 2023-09-02 ENCOUNTER — Encounter: Payer: Self-pay | Admitting: Gastroenterology

## 2023-09-02 ENCOUNTER — Telehealth: Payer: Self-pay | Admitting: *Deleted

## 2023-09-02 VITALS — BP 125/74 | HR 69 | Temp 97.0°F | Ht 63.0 in | Wt 121.2 lb

## 2023-09-02 DIAGNOSIS — R112 Nausea with vomiting, unspecified: Secondary | ICD-10-CM

## 2023-09-02 DIAGNOSIS — D509 Iron deficiency anemia, unspecified: Secondary | ICD-10-CM

## 2023-09-02 DIAGNOSIS — R1013 Epigastric pain: Secondary | ICD-10-CM

## 2023-09-02 MED ORDER — SUCRALFATE 1 G PO TABS: 1.0000 g | ORAL_TABLET | Freq: Three times a day (TID) | ORAL | 1 refills | Status: AC

## 2023-09-02 MED ORDER — ONDANSETRON HCL 4 MG PO TABS: 4.0000 mg | ORAL_TABLET | Freq: Three times a day (TID) | ORAL | 1 refills | Status: AC | PRN

## 2023-09-02 NOTE — Telephone Encounter (Signed)
  Request for patient to stop medication prior to procedure or is needing cleareance  09/02/23  Randy [name] [birth year]  What type of surgery is being performed? EGD  When is surgery scheduled? TBD  Clearance to hold PLAVIX x 5 days prior  Name of physician performing surgery?  Dr. Earnest Bailey [facility] at [facility] Phone: [phone] Fax: [phone]  Anethesia type (none, local, MAC, general)? MAC

## 2023-09-02 NOTE — Telephone Encounter (Signed)
Noted. Clearance sent

## 2023-09-02 NOTE — Telephone Encounter (Signed)
Spoke with patient's daughter, Aleene Davidson, who is in agreements for patient to proceed with upper endoscopy to further evaluate his epigastric abdominal pain, nausea, vomiting.  She also states that patient usually signs his own consent with the use of an interpreter.  Mandy/Tammy, FYI.

## 2023-09-08 NOTE — Telephone Encounter (Signed)
error 

## 2023-09-08 NOTE — Telephone Encounter (Signed)
[employer] PA: Approved Authorization #[id number]  Tracking #[id number] Dates of service 10/12/2023 - 01/11/2024

## 2023-09-16 ENCOUNTER — Telehealth (HOSPITAL_BASED_OUTPATIENT_CLINIC_OR_DEPARTMENT_OTHER): Payer: Self-pay | Admitting: *Deleted

## 2023-09-16 NOTE — Telephone Encounter (Signed)
    Primary Cardiologist:Randy Karlyne Greenspan, MD  Chart reviewed as part of pre-operative protocol coverage. Because of Randy [name]'s past medical history and time since last visit, he/she will require a follow-up visit in order to better assess preoperative cardiovascular risk.  Pre-op covering staff: - Please schedule Office appointment and call patient to inform them. - Please contact requesting surgeon's office via preferred method (i.e, phone, fax) to inform them of need for appointment prior to surgery.  If applicable, this message will also be routed to pharmacy pool and/or primary cardiologist for input on holding anticoagulant/antiplatelet agent as requested below so that this information is available at time of patient's appointment.   Ronney Asters, NP  09/16/2023, 1:04 PM

## 2023-09-16 NOTE — Telephone Encounter (Signed)
Pre-operative Risk Assessment    Patient Name: Carmie End  DOB: 1941-08-01 MRN: [id number]      Request for Surgical Clearance    Procedure:   EGD  Date of Surgery:  Clearance TBD                                 Surgeon:  Dr. Earnest Bailey Surgeon's Group or Practice Name:  [facility] Phone number:  [phone] Fax number:  [phone]   Type of Clearance Requested:   - Medical  - Pharmacy:  Hold Aspirin and Clopidogrel (Plavix) 5 days prior   Type of Anesthesia:  MAC   Additional requests/questions:  Do not know if this pt is seen at [facility].   Signed, Emmit Pomfret   09/16/2023, 12:50 PM

## 2023-09-18 NOTE — Telephone Encounter (Signed)
I called [employer] so that we could reach out to the pt about needing appt. I was on hold waiting for a representative 5+ minutes. I then reached out to the pt's daughter Aleene Davidson. I left a message pt is going to need an IN OFFICE APPT FOR PRE OP CLEARANCE. Pt was seen in 2022 in the hospital by our practice.

## 2023-09-23 NOTE — Telephone Encounter (Signed)
2nd attempt at scheduling in-office for preop clearance. [email or web address]

## 2023-09-28 ENCOUNTER — Telehealth: Payer: Self-pay | Admitting: Internal Medicine

## 2023-09-28 NOTE — Telephone Encounter (Signed)
Coralee Rud with [facility] left a message that this patient was to be scheduled for an EGD an he hasn't heard from anyone.  [phone]

## 2023-09-28 NOTE — Telephone Encounter (Signed)
Randy [name] from [facility] that we are waiting on the clearance to hold Plavix. Cardiology has been trying to get in touch with pt's daughter to schedule in office appointment for clearance. He states at least he knows what's going on and gives him something to work with.

## 2023-10-07 NOTE — Telephone Encounter (Signed)
3rd attempt to reach the pt to schedule in office appt for pre op clearance. We have tried going through [employer] with being on hold for 5+ again x 2. We have also left a message for the pt's daughter.   I will update the requesting office the pt will need to call back to schedule an appt IN OFFICE FOR PRE OP CLEARANCE. I will removed form the pre op call back pool.
